# Patient Record
Sex: Female | Born: 1985 | Race: Black or African American | Hispanic: No | Marital: Single | Smoking: Never smoker
Health system: Southern US, Community
[De-identification: ages and names within clinical notes are randomized; demographics above are authoritative.]

## PROBLEM LIST (undated history)

## (undated) DIAGNOSIS — F39 Unspecified mood [affective] disorder: Secondary | ICD-10-CM

## (undated) DIAGNOSIS — F419 Anxiety disorder, unspecified: Secondary | ICD-10-CM

## (undated) DIAGNOSIS — F840 Autistic disorder: Secondary | ICD-10-CM

## (undated) DIAGNOSIS — F32A Depression, unspecified: Secondary | ICD-10-CM

## (undated) DIAGNOSIS — N186 End stage renal disease: Secondary | ICD-10-CM

## (undated) DIAGNOSIS — K219 Gastro-esophageal reflux disease without esophagitis: Secondary | ICD-10-CM

## (undated) DIAGNOSIS — F329 Major depressive disorder, single episode, unspecified: Secondary | ICD-10-CM

## (undated) DIAGNOSIS — IMO0001 Reserved for inherently not codable concepts without codable children: Secondary | ICD-10-CM

## (undated) DIAGNOSIS — R4689 Other symptoms and signs involving appearance and behavior: Secondary | ICD-10-CM

## (undated) DIAGNOSIS — Z992 Dependence on renal dialysis: Secondary | ICD-10-CM

## (undated) DIAGNOSIS — E785 Hyperlipidemia, unspecified: Secondary | ICD-10-CM

## (undated) DIAGNOSIS — F79 Unspecified intellectual disabilities: Secondary | ICD-10-CM

## (undated) DIAGNOSIS — I10 Essential (primary) hypertension: Secondary | ICD-10-CM

## (undated) DIAGNOSIS — E039 Hypothyroidism, unspecified: Secondary | ICD-10-CM

## (undated) DIAGNOSIS — D649 Anemia, unspecified: Secondary | ICD-10-CM

## (undated) DIAGNOSIS — IMO0002 Reserved for concepts with insufficient information to code with codable children: Secondary | ICD-10-CM

## (undated) HISTORY — DX: End stage renal disease: N18.6

## (undated) HISTORY — DX: Depression, unspecified: F32.A

## (undated) HISTORY — DX: Major depressive disorder, single episode, unspecified: F32.9

## (undated) HISTORY — DX: Autistic disorder: F84.0

## (undated) HISTORY — DX: Hyperlipidemia, unspecified: E78.5

## (undated) HISTORY — DX: Anemia, unspecified: D64.9

## (undated) HISTORY — DX: Hypothyroidism, unspecified: E03.9

## (undated) HISTORY — DX: End stage renal disease: Z99.2

## (undated) HISTORY — DX: Unspecified intellectual disabilities: F79

## (undated) HISTORY — DX: Unspecified mood (affective) disorder: F39

## (undated) HISTORY — DX: Reserved for concepts with insufficient information to code with codable children: IMO0002

## (undated) HISTORY — DX: Other symptoms and signs involving appearance and behavior: R46.89

## (undated) HISTORY — DX: Essential (primary) hypertension: I10

## (undated) HISTORY — DX: Anxiety disorder, unspecified: F41.9

---
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
## 2005-01-21 ENCOUNTER — Ambulatory Visit: Payer: Self-pay | Admitting: Family Medicine

## 2005-02-22 ENCOUNTER — Ambulatory Visit: Payer: Self-pay | Admitting: Sports Medicine

## 2005-06-07 ENCOUNTER — Ambulatory Visit: Payer: Self-pay | Admitting: Family Medicine

## 2005-09-08 ENCOUNTER — Ambulatory Visit: Payer: Self-pay | Admitting: Family Medicine

## 2005-09-09 ENCOUNTER — Encounter: Admission: RE | Admit: 2005-09-09 | Discharge: 2005-09-09 | Payer: Self-pay | Admitting: Sports Medicine

## 2005-11-16 ENCOUNTER — Ambulatory Visit: Payer: Self-pay | Admitting: Sports Medicine

## 2006-06-23 DIAGNOSIS — F79 Unspecified intellectual disabilities: Secondary | ICD-10-CM

## 2006-06-23 DIAGNOSIS — F411 Generalized anxiety disorder: Secondary | ICD-10-CM | POA: Insufficient documentation

## 2006-06-23 HISTORY — DX: Unspecified intellectual disabilities: F79

## 2006-09-26 ENCOUNTER — Encounter (INDEPENDENT_AMBULATORY_CARE_PROVIDER_SITE_OTHER): Payer: Self-pay | Admitting: Family Medicine

## 2006-09-26 ENCOUNTER — Ambulatory Visit: Payer: Self-pay | Admitting: Family Medicine

## 2006-09-26 LAB — CONVERTED CEMR LAB
Cholesterol: 210 mg/dL — ABNORMAL HIGH (ref 0–200)
HDL: 56 mg/dL (ref >39)
Total CHOL/HDL Ratio: 3.8
Triglycerides: 72 mg/dL (ref <150)

## 2006-09-27 ENCOUNTER — Encounter (INDEPENDENT_AMBULATORY_CARE_PROVIDER_SITE_OTHER): Payer: Self-pay | Admitting: Family Medicine

## 2006-11-23 ENCOUNTER — Ambulatory Visit: Payer: Self-pay | Admitting: Family Medicine

## 2006-11-23 LAB — CONVERTED CEMR LAB
Blood in Urine, dipstick: NEGATIVE
Glucose, Urine, Semiquant: NEGATIVE
Ketones, urine, test strip: NEGATIVE
Protein, U semiquant: 100
Specific Gravity, Urine: >=1.03
WBC Urine, dipstick: NEGATIVE

## 2006-12-20 ENCOUNTER — Encounter (INDEPENDENT_AMBULATORY_CARE_PROVIDER_SITE_OTHER): Payer: Self-pay | Admitting: Family Medicine

## 2006-12-23 ENCOUNTER — Telehealth: Payer: Self-pay | Admitting: *Deleted

## 2006-12-28 ENCOUNTER — Ambulatory Visit: Payer: Self-pay | Admitting: Family Medicine

## 2006-12-28 LAB — CONVERTED CEMR LAB
Beta hcg, urine, semiquantitative: NEGATIVE
Blood in Urine, dipstick: NEGATIVE
Glucose, Urine, Semiquant: NEGATIVE
Nitrite: NEGATIVE
Protein, U semiquant: 100
Urobilinogen, UA: 0.2
WBC Urine, dipstick: NEGATIVE

## 2008-04-21 ENCOUNTER — Emergency Department (HOSPITAL_COMMUNITY): Admission: EM | Admit: 2008-04-21 | Discharge: 2008-04-21 | Payer: Self-pay | Admitting: Emergency Medicine

## 2008-05-16 ENCOUNTER — Encounter: Payer: Self-pay | Admitting: Family Medicine

## 2008-05-16 ENCOUNTER — Ambulatory Visit: Payer: Self-pay | Admitting: Family Medicine

## 2008-05-16 LAB — CONVERTED CEMR LAB
ALT: 13 units/L (ref 0–35)
Blood in Urine, dipstick: NEGATIVE
CO2: 23 meq/L (ref 19–32)
Calcium: 9.7 mg/dL (ref 8.4–10.5)
Chloride: 104 meq/L (ref 96–112)
Creatinine, Ser: 0.93 mg/dL (ref 0.40–1.20)
HCT: 33.7 % — ABNORMAL LOW (ref 36.0–46.0)
MCHC: 30.9 g/dL (ref 30.0–36.0)
Nitrite: NEGATIVE
Platelets: 331 10*3/uL (ref 150–400)
Potassium: 4.4 meq/L (ref 3.5–5.3)
Protein, U semiquant: 30
RDW: 16.4 % — ABNORMAL HIGH (ref 11.5–15.5)
Total Protein: 7.8 g/dL (ref 6.0–8.3)

## 2008-05-20 ENCOUNTER — Encounter: Payer: Self-pay | Admitting: Family Medicine

## 2008-05-20 DIAGNOSIS — D509 Iron deficiency anemia, unspecified: Secondary | ICD-10-CM | POA: Insufficient documentation

## 2008-05-22 ENCOUNTER — Encounter: Payer: Self-pay | Admitting: Family Medicine

## 2008-05-22 LAB — CONVERTED CEMR LAB
Saturation Ratios: 8 % — ABNORMAL LOW (ref 20–55)
TIBC: 334 ug/dL (ref 250–470)
UIBC: 307 ug/dL

## 2008-05-30 ENCOUNTER — Encounter: Payer: Self-pay | Admitting: Family Medicine

## 2008-10-29 ENCOUNTER — Ambulatory Visit: Payer: Self-pay | Admitting: Family Medicine

## 2008-10-29 ENCOUNTER — Telehealth: Payer: Self-pay | Admitting: Family Medicine

## 2008-10-29 LAB — CONVERTED CEMR LAB
Bilirubin Urine: NEGATIVE
Glucose, Urine, Semiquant: NEGATIVE
Ketones, urine, test strip: NEGATIVE
Nitrite: NEGATIVE
Protein, U semiquant: 100
Specific Gravity, Urine: >=1.03
Urobilinogen, UA: 0.2
WBC Urine, dipstick: NEGATIVE
pH: 5.5

## 2009-02-26 ENCOUNTER — Encounter: Payer: Self-pay | Admitting: Family Medicine

## 2009-02-26 ENCOUNTER — Ambulatory Visit (HOSPITAL_COMMUNITY): Admission: RE | Admit: 2009-02-26 | Discharge: 2009-02-26 | Payer: Self-pay | Admitting: Family Medicine

## 2009-02-26 ENCOUNTER — Ambulatory Visit: Payer: Self-pay | Admitting: Family Medicine

## 2009-02-26 DIAGNOSIS — R339 Retention of urine, unspecified: Secondary | ICD-10-CM

## 2009-02-26 DIAGNOSIS — E66812 Obesity, class 2: Secondary | ICD-10-CM | POA: Insufficient documentation

## 2009-02-26 DIAGNOSIS — R000 Tachycardia, unspecified: Secondary | ICD-10-CM | POA: Insufficient documentation

## 2009-02-26 DIAGNOSIS — I10 Essential (primary) hypertension: Secondary | ICD-10-CM | POA: Insufficient documentation

## 2009-02-26 DIAGNOSIS — E669 Obesity, unspecified: Secondary | ICD-10-CM

## 2009-02-26 LAB — CONVERTED CEMR LAB
Blood in Urine, dipstick: NEGATIVE
CO2: 24 meq/L (ref 19–32)
Calcium: 9.8 mg/dL (ref 8.4–10.5)
Chloride: 103 meq/L (ref 96–112)
Glucose, Bld: 108 mg/dL — ABNORMAL HIGH (ref 70–99)
Nitrite: NEGATIVE
Potassium: 4.6 meq/L (ref 3.5–5.3)
Sodium: 139 meq/L (ref 135–145)
Specific Gravity, Urine: >=1.03
WBC Urine, dipstick: NEGATIVE

## 2009-02-27 ENCOUNTER — Encounter: Payer: Self-pay | Admitting: Family Medicine

## 2009-03-03 ENCOUNTER — Telehealth: Payer: Self-pay | Admitting: *Deleted

## 2009-03-04 ENCOUNTER — Encounter: Payer: Self-pay | Admitting: Family Medicine

## 2009-03-12 ENCOUNTER — Ambulatory Visit: Payer: Self-pay | Admitting: Family Medicine

## 2009-03-12 DIAGNOSIS — E039 Hypothyroidism, unspecified: Secondary | ICD-10-CM

## 2009-03-12 HISTORY — DX: Hypothyroidism, unspecified: E03.9

## 2009-05-28 ENCOUNTER — Ambulatory Visit: Payer: Self-pay | Admitting: Family Medicine

## 2009-05-28 LAB — CONVERTED CEMR LAB
Beta hcg, urine, semiquantitative: NEGATIVE
Bilirubin Urine: NEGATIVE
Blood in Urine, dipstick: NEGATIVE
Glucose, Urine, Semiquant: NEGATIVE
Ketones, urine, test strip: NEGATIVE
Protein, U semiquant: 30
pH: 5.5

## 2009-05-30 ENCOUNTER — Encounter: Admission: RE | Admit: 2009-05-30 | Discharge: 2009-05-30 | Payer: Self-pay | Admitting: Family Medicine

## 2009-06-02 ENCOUNTER — Telehealth: Payer: Self-pay | Admitting: *Deleted

## 2009-06-03 ENCOUNTER — Encounter: Payer: Self-pay | Admitting: *Deleted

## 2009-07-16 ENCOUNTER — Encounter: Payer: Self-pay | Admitting: Family Medicine

## 2009-07-16 ENCOUNTER — Ambulatory Visit: Payer: Self-pay | Admitting: Family Medicine

## 2009-07-17 ENCOUNTER — Encounter: Payer: Self-pay | Admitting: Family Medicine

## 2009-07-17 LAB — CONVERTED CEMR LAB
Calcium: 9.4 mg/dL (ref 8.4–10.5)
Creatinine, Ser: 1.01 mg/dL (ref 0.40–1.20)

## 2009-07-18 LAB — CONVERTED CEMR LAB
Free T4: 0.87 ng/dL (ref 0.80–1.80)
T3, Free: 2.6 pg/mL (ref 2.3–4.2)

## 2009-07-23 ENCOUNTER — Telehealth: Payer: Self-pay | Admitting: Family Medicine

## 2009-07-24 ENCOUNTER — Encounter: Payer: Self-pay | Admitting: Family Medicine

## 2009-08-26 ENCOUNTER — Telehealth: Payer: Self-pay | Admitting: *Deleted

## 2009-08-27 ENCOUNTER — Ambulatory Visit: Payer: Self-pay | Admitting: Family Medicine

## 2009-09-11 ENCOUNTER — Telehealth: Payer: Self-pay | Admitting: Family Medicine

## 2009-10-14 ENCOUNTER — Encounter: Payer: Self-pay | Admitting: Family Medicine

## 2009-10-14 ENCOUNTER — Ambulatory Visit: Payer: Self-pay | Admitting: Family Medicine

## 2009-10-15 ENCOUNTER — Encounter: Payer: Self-pay | Admitting: Family Medicine

## 2009-11-05 ENCOUNTER — Encounter: Payer: Self-pay | Admitting: Family Medicine

## 2009-11-05 ENCOUNTER — Ambulatory Visit: Payer: Self-pay | Admitting: Family Medicine

## 2009-11-05 DIAGNOSIS — L2089 Other atopic dermatitis: Secondary | ICD-10-CM | POA: Insufficient documentation

## 2009-11-05 LAB — CONVERTED CEMR LAB
Blood in Urine, dipstick: NEGATIVE
Glucose, Urine, Semiquant: NEGATIVE
Ketones, urine, test strip: NEGATIVE
Nitrite: NEGATIVE
Protein, U semiquant: 30
WBC Urine, dipstick: NEGATIVE
pH: 6

## 2009-11-12 ENCOUNTER — Telehealth: Payer: Self-pay | Admitting: Family Medicine

## 2009-12-08 ENCOUNTER — Ambulatory Visit: Payer: Self-pay | Admitting: Family Medicine

## 2009-12-08 ENCOUNTER — Encounter: Payer: Self-pay | Admitting: Family Medicine

## 2009-12-09 ENCOUNTER — Encounter: Payer: Self-pay | Admitting: Family Medicine

## 2009-12-09 LAB — CONVERTED CEMR LAB: Glucose, Bld: 81 mg/dL (ref 70–99)

## 2009-12-10 LAB — CONVERTED CEMR LAB
Hemoglobin: 11.1 g/dL — ABNORMAL LOW (ref 12.0–15.0)
RDW: 15.6 % — ABNORMAL HIGH (ref 11.5–15.5)
TSH: 7.846 microintl units/mL — ABNORMAL HIGH (ref 0.350–4.500)

## 2010-01-26 ENCOUNTER — Ambulatory Visit: Payer: Self-pay | Admitting: Family Medicine

## 2010-01-26 ENCOUNTER — Encounter: Payer: Self-pay | Admitting: Family Medicine

## 2010-01-26 DIAGNOSIS — R7301 Impaired fasting glucose: Secondary | ICD-10-CM

## 2010-01-26 LAB — CONVERTED CEMR LAB
Hgb A1c MFr Bld: 5.8 %
Ketones, urine, test strip: NEGATIVE
Nitrite: NEGATIVE
Specific Gravity, Urine: >=1.03
Urobilinogen, UA: 0.2
WBC Urine, dipstick: NEGATIVE

## 2010-01-27 LAB — CONVERTED CEMR LAB: TSH: 4.927 microintl units/mL — ABNORMAL HIGH (ref 0.350–4.500)

## 2010-04-10 ENCOUNTER — Encounter (INDEPENDENT_AMBULATORY_CARE_PROVIDER_SITE_OTHER): Payer: Self-pay | Admitting: *Deleted

## 2010-05-16 ENCOUNTER — Encounter: Payer: Self-pay | Admitting: Family Medicine

## 2010-05-17 ENCOUNTER — Encounter: Payer: Self-pay | Admitting: Family Medicine

## 2010-05-17 ENCOUNTER — Encounter: Payer: Self-pay | Admitting: Sports Medicine

## 2010-05-19 ENCOUNTER — Encounter: Payer: Self-pay | Admitting: Family Medicine

## 2010-05-26 NOTE — Progress Notes (Signed)
Summary: re: transvaginal US/TS        Additional Follow-up for Phone Call Additional follow up Details #2::    [place] RE: TRANSVAG US WITH SEDATION. TONI WILL FIND OUT, IF IT CAN BE DONE AND WILL CALL ME BACK. waiting on call back.  also called pt's mom and lmvm to call back. Follow-up by: Mauricia Area CMA,,  June 02, 2009 11:39 AM  Called Mom about Abd U/S report.  Result negative.  Pt c/o of stomach pain about everyday per Mom, for about 3 months now.  Pt c/o bladder feeling full for about a year now.  Menses every 3 wks, some cramping with it.  Lasts 5 days.  Mom does not feel that c/o abd pain is related to menses.   Mom states that pt still is asking for sedation for transvag u/s, she is quite fearful of anything being inserted.  Told mom we will sched outpt sedation for transvag u/s.  Cat Ta MD  June 02, 2009 8:46 AM

## 2010-05-26 NOTE — Progress Notes (Signed)
   Phone Note Outgoing Call   Call placed by: Merla Riches MD,  Sep 11, 2009 3:57 PM Call placed to: Patient Summary of Call: Called Mom.  Checking on patient about visit two weeks ago for nausea.  I would also like for pt to come in for another TSH check, at her earliest convenience.  Pt does not have to see me for this.  I will call mom with result.   Initial call taken by: Deeana Atwater MD,  Sep 11, 2009 3:58 PM

## 2010-05-26 NOTE — Assessment & Plan Note (Signed)
Summary: f/up,tcb   Vital Signs:  Patient profile:   25 year old female Height:      64.5 inches Weight:      208 pounds BMI:     35.28 Temp:     98.3 degrees F oral Pulse rate:   84 / minute BP sitting:   130 / 84  (right arm) Cuff size:   large  Vitals Entered By: Schuyler Amor CMA (July 16, 2009 8:38 AM)  Nutrition Counseling: Patient's BMI is greater than 25 and therefore counseled on weight management options. CC: F/U BP Pain Assessment Patient in pain? no        Primary Care Provider:  Chloee Tena MD  CC:  F/U BP.  History of Present Illness: 25 y/o F with HTN, Obesity, Mental retardation here with mother for HTN f/u  HYPERTENSION Disease Monitoring: has not checked  Blood pressure range: 130s-140s   Medications Compliance: yes  Lightheadedness: no Edema: no  Chest pain: no  Dyspnea: yes with taking out garbage (walks up a hill to take garbage out) Prevention Exercise: no, very sedantary Salt restriction: no, eating canned foods  OBESITY Not exercising.  Not dieting.  Pt's usual day: sleep, gets up, watch TV, eat, sleep, repeat.  She considers "walking in the house" exercise.     Current Medications (verified): 1)  Ferrous Sulfate 325 (65 Fe) Mg Tabs (Ferrous Sulfate) .... One Tablet By Mouth Two Times A Day 2)  Seroquel 300 Mg Tabs (Quetiapine Fumarate) .... 2 Tabs By Mouth At Bedtime Per Dr Jordan Hawks 3)  Metoprolol Tartrate 50 Mg Tabs (Metoprolol Tartrate) .Marland Kitchen.. 1 Tab By Mouth Bid 4)  Luvox Cr 100 Mg Xr24h-Cap (Fluvoxamine Maleate) .... 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    Past Surgical History: Last updated: 06/23/2006 Cholecystectomy - 11/24/2004  Family History: Last updated: 05/16/2008 Diabetes:  maternal grandparents HTN:   mother, maternal grandmother CAD:  maternal great grandmother Bladder CA: maternal grandmother  Social History: Last updated: 02/26/2009 Lives with Mother; No tobacco, ETOH; VERY sedentary.  Never sexually active.  Physical Exam  General:  Well-developed,well-nourished,in no acute distress; alert,appropriate and cooperative throughout examination. vitals reviewed Neck:  supple, full ROM, and no masses.   Lungs:  Normal respiratory effort, chest expands symmetrically. Lungs are clear to auscultation, no crackles or wheezes. Heart:  Normal rate and regular rhythm. S1 and S2 normal without gallop, murmur, click, rub or other extra sounds. Abdomen:  Bowel sounds positive,abdomen soft and non-tender without masses, organomegaly or hernias noted. obese Pulses:  R and L radial,dorsalis pedis pulses are full and equal bilaterally Extremities:  No clubbing, cyanosis, edema, or deformity noted with normal full range of motion of all joints.   Skin:  Intact without suspicious lesions or rashes Cervical Nodes:  No lymphadenopathy noted   Impression & Recommendations:  Problem # 1:  HYPERTENSION (ICD-401.9) Assessment Improved  BP improved from last visit and almost at goal at 130/84, pulse 84.  I will not make changes to BP med at this time.  We discussed decreasing salt intake (pt eats a lot of canned foods).  Will try to change to frozen foods.  Pt not adding salt to food.  Will also try to exercise, with weight loss should decresae BP.  RTC  in 3 months for recheck.  Her updated medication list for this problem includes:    Metoprolol Tartrate 50 Mg Tabs (Metoprolol tartrate) .Marland Kitchen... 1 tab by mouth bid  Orders: [facility] [id number]) [place]- Est Level  3 [id number])  Problem # 2:  OBESITY, CLASS I (ICD-278.02) Assessment: Unchanged  BMI 35.  Pt states that she cannot walk outside of the house by herself and mom works during day.  I tried to get Mom to walk with pt, but Mom not interested in  walking.  I tried to encouraged pt to exercise with DVDx and VHS tapes she can get from [employer].  Mom did not commit to taking pt to [employer].  I do not think that will lose weight, but will continue to enncourage this.  Will refer to nutrition if no weight loss at next visit.    Orders: [place]- Est Level  3 [id number])  Complete Medication List: 1)  Ferrous Sulfate 325 (65 Fe) Mg Tabs (Ferrous sulfate) .... One tablet by mouth two times a day 2)  Seroquel 300 Mg Tabs (Quetiapine fumarate) .... 2 tabs by mouth at bedtime per dr senna 3)  Metoprolol Tartrate 50 Mg Tabs (Metoprolol tartrate) .Marland Kitchen.. 1 tab by mouth bid 4)  Luvox Cr 100 Mg Xr24h-cap (Fluvoxamine maleate) .... Increase slowly to 2 tab two times a day per dr  Arbie Cookey senna  Other Orders: TSH-[facility] [id number]) TSH-[facility] [id number])  Patient Instructions: 1)  Please schedule a follow-up appointment in 3 months.  2)  You need to lose weight. Consider a lower calorie diet and regular exercise.  3)  It is important that you exercise reguarly at least 20 minutes 5 times a week. If you develop chest pain, have severe difficulty breathing, or feel very tired, stop exercising immediately and seek medical attention.     Prevention & Chronic Care Immunizations   Influenza vaccine: Fluvax MCR  (03/12/2009)    Tetanus booster: 05/16/2008: Tdap    Pneumococcal vaccine: Not documented  Other Screening   Pap smear: Not documented   Pap smear action/deferral: Not indicated-other  (05/28/2009)   Smoking status: never  (05/28/2009)  Hypertension   Last Blood Pressure: 130 / 84  (07/16/2009)   Serum creatinine: 0.91  (02/26/2009)   BMP action: Ordered   Serum potassium 4.6  (02/26/2009)    Hypertension flowsheet reviewed?: Yes   Progress toward BP goal: At goal  Self-Management Support :   Personal Goals (by the next clinic visit) :      Personal blood pressure goal: 130/80  (07/16/2009)   Hypertension self-management support: Written  self-care plan  (07/16/2009)   Hypertension self-care plan printed.

## 2010-05-26 NOTE — Assessment & Plan Note (Signed)
Summary: bladder not empyting ,df   Vital Signs:  Patient profile:   25 year old female Weight:      212 pounds BMI:     35.96 Temp:     98.3 degrees F oral Pulse rate:   72 / minute Pulse rhythm:   regular BP sitting:   127 / 80  (left arm) Cuff size:   large  Vitals Entered By: Audelia Hives CMA (January 26, 2010 8:36 AM) CC: follow-up visit   Primary Care Provider:  CAT TA MD  CC:  follow-up visit.  History of Present Illness: 25 y/o F with MR, anxiety DO, HTN, obesity is here for hypothyroid follow-up but mom has other concerns.  Mom brought pt to appt today.   Hypothyroidism: taking  Synthroid 70mcg without problems.  Pt has had weight gain in the past year of 4 lbs.  She leads a sedantary lifestyle and does not perform any exercises.   She denies constipation, diarrhea, abd pain, fever, chills, weight loss.    Urinary symptoms: feels like she has to void, but after voiding it feels like she did not empty her bladder.  She has had incidences of "leaking".  She states that sometimes she would sit on the toilet and after voiding urine would still leak.  She has not made it to the bathroom a few times.  Mom states that when she was in school before (yrs ago) she would her urine and not tell her teacher that she has to go to the bathroom.  She states that she sometimes wait a long time now to void.  Denies dysuria, fever, chills.    HYPERTENSION Disease Monitoring Blood pressure range: 110s-130s/70s-80s Medications: metoprolol 50mg  two times a day  Compliance: yes  Lightheadedness: no  Edema:no  Chest pain: no  Dyspnea:no     Prevention Exercise: not really Salt restriction:yes   OBESITY BMI: 35.96 Weight difference since last OV:  +5lbs since 10/2009 Exercise: no, but just started walking       How often:   2-3x/wk    Diet: mom trying to give her healthier diet  Dysfunctional Glucose:  fasting and random glucose <126.  Mom statest that Dr Jordan Hawks (psych) checked her "sugar"  and it was 6.9.  Mom concerned about diabetes.    Habits & Providers  Alcohol-Tobacco-Diet     Tobacco Status: never     Passive Smoke Exposure: no  Current Medications (verified): 1)  Ferrous Sulfate 325 (65 Fe) Mg Tabs (Ferrous Sulfate) .... One Tablet By Mouth Two Times A Day 2)  Seroquel 300 Mg Tabs (Quetiapine Fumarate) .Marland Kitchen.. 1 Tab By Mouth At Bedtime 3)  Metoprolol Tartrate 50 Mg Tabs (Metoprolol Tartrate) .Marland Kitchen.. 1 Tab By Mouth Bid 4)  Sertraline Hcl 100 Mg Tabs (Sertraline Hcl) .Marland Kitchen.. 1 and 1/2 Tabs By Mouth Daily 5)  Abilify 10 Mg Tabs (Aripiprazole) .Marland Kitchen.. 1 Tab By Mouth Qam 6)  Divalproex Sodium 500 Mg Tbec (Divalproex Sodium) .... 2 Tabs By Mouth Qhs 7)  Pantoprazole Sodium 40 Mg Tbec (Pantoprazole Sodium) .Marland Kitchen.. 1 Tab By Mouth Two Times A Day For 3 Days and Then Daily 8)  Colace 100 Mg Caps (Docusate Sodium) .Marland Kitchen.. 1 Tab By Mouth Two Times A Day 9)  Triamcinolone Acetonide 0.5 % Crea (Triamcinolone Acetonide) .... Apply To Affected Areas Two Times A Day.  Dispense 60 Grams. 10)  Hydroxyzine Hcl 25 Mg Tabs (Hydroxyzine Hcl) .Marland Kitchen.. 1 Tab By Mouth Once A Day As Needed For Itchiness  11)  Synthroid 75 Mcg Tabs (Levothyroxine Sodium) .Marland Kitchen.. 1 Tab By Mouth Daily.  Please Keep Same Manufacturer As Previous Dose.  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   Subclinical Hypothyroidism Hypertension Obesity Iron Def Anemia  Past Surgical History: Last updated: 06/23/2006 Cholecystectomy - 11/24/2004  Family History: Last updated: 05/16/2008 Diabetes:  maternal grandparents HTN:  mother, maternal grandmother CAD:  maternal great grandmother Bladder CA: maternal grandmother  Social History: Last updated: 02/26/2009 Lives with Mother; No tobacco, ETOH; VERY sedentary.  Never sexually active.  Risk Factors: Caffeine Use: 0  (05/16/2008) Exercise: no (05/16/2008)  Risk Factors: Smoking Status: never (01/26/2010) Passive Smoke Exposure: no (01/26/2010)  Review of Systems       per hpi   Physical Exam  General:  Well-developed,well-nourished,in no acute distress; alert,appropriate and cooperative throughout examination Neck:  No deformities, masses, or tenderness noted. Lungs:  Normal respiratory effort, chest expands symmetrically. Lungs are clear to auscultation, no crackles or wheezes. Heart:  Normal rate and regular rhythm. S1 and S2 normal without gallop, murmur, click, rub or other extra sounds. No bruit Abdomen:  Bowel sounds positive,abdomen soft and non-tender without masses, organomegaly or hernias noted. Msk:  normal ROM.   Pulses:  R radial normal, R dorsalis pedis normal, L radial normal, and L dorsalis pedis normal.   Extremities:  No clubbing, cyanosis, edema, or deformity noted with normal full range of motion of all joints.   Neurologic:  alert & oriented X3.   Cervical Nodes:  No lymphadenopathy noted   Impression & Recommendations:  Problem # 1:  HYPOTHYROIDISM (ICD-244.9) Assessment Unchanged Will check TSH today.  Last TSH was elevated.   Her updated medication list for this problem includes:    Synthroid 75 Mcg Tabs (Levothyroxine sodium) .Marland Kitchen... 1 tab by mouth daily.  please keep same manufacturer as previous dose.  Orders: [facility] [id number]) [place]- Est  Level 4 [id number])  Problem # 2:  INCOMPLETE VOIDING [id number]) Assessment: Unchanged Pt has had this complaint for a few months to a year.  As before UA negative.  She would not allow me to perform pelvic exam.  I would like to have post void residual/bladder scan done, but in radiology dept this test requires probe/cath insertion.  Pt refused this.  We will try to bladder train instead.  Pt to void first thing in AM and then she is to go to bathroom and sit on toilet every 4 hours.  I am hoping that training and timing voiding  time will decrease "leaking".   Orders: [facility] (00000) [place]- Est  Level 4 [id number])  Problem # 3:  HYPERTENSION (ICD-401.9) Assessment: Unchanged BP stable on current med.  Will continue.  Her updated medication list for this problem includes:    Metoprolol Tartrate 50 Mg Tabs (Metoprolol tartrate) .Marland Kitchen... 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  Random blood glucose here has been wnl.  Will check A1C today since pt does have risk factor of obesity.  Orders: [facility]- Est  Level 4 ([id number]) A1C-[facility] [id number])  Complete Medication List: 1)  Ferrous Sulfate 325 (65 Fe) Mg Tabs (Ferrous sulfate) .... One tablet by mouth two times a day 2)  Seroquel 300 Mg Tabs (Quetiapine  fumarate) .Marland Kitchen.. 1 tab by mouth at bedtime 3)  Metoprolol Tartrate 50 Mg Tabs (Metoprolol tartrate) .Marland Kitchen.. 1 tab by mouth bid 4)  Sertraline Hcl 100 Mg Tabs (Sertraline hcl) .Marland Kitchen.. 1 and 1/2 tabs by mouth daily 5)  Abilify 10 Mg Tabs (Aripiprazole) .Marland Kitchen.. 1 tab by mouth qam 6)  Divalproex Sodium 500 Mg Tbec (Divalproex sodium) .... 2 tabs by mouth qhs 7)  Pantoprazole Sodium 40 Mg Tbec (Pantoprazole sodium) .Marland Kitchen.. 1 tab by mouth two times a day for 3 days and then daily 8)  Colace 100 Mg Caps (Docusate sodium) .Marland Kitchen.. 1 tab by mouth two times a day 9)  Triamcinolone Acetonide 0.5 % Crea (Triamcinolone acetonide) .... Apply to affected areas two times a day.  dispense 60 grams. 10)  Hydroxyzine Hcl 25 Mg Tabs (Hydroxyzine hcl) .Marland Kitchen.. 1 tab by mouth once a day as needed for itchiness 11)  Synthroid 75 Mcg Tabs (Levothyroxine sodium) .Marland Kitchen.. 1 tab by mouth daily.  please keep same manufacturer as previous dose. Prescriptions: METOPROLOL TARTRATE 50 MG TABS (METOPROLOL TARTRATE) 1 tab by mouth bid  #66 Tablet x 5   Entered and Authorized by:   Merla Riches MD   Signed by:   Merla Riches MD on 01/26/2010   Method used:   Electronically  to        [facility]. [id number]* (retail)       [address]       [place], [place]  [address]       Ph: [phone] or [phone]       Fax: [phone]   RxID:   [id number]   Laboratory Results   Urine Tests  Date/Time Received: January 26, 2010 9:09 AM  Date/Time Reported: January 26, 2010 10:15 AM   Routine Urinalysis   Color: yellow Appearance: Clear Glucose: negative   (Normal Range: Negative) Bilirubin: negative   (Normal Range: Negative) Ketone: negative   (Normal Range: Negative) Spec. Gravity: >=1.030   (Normal Range: 1.003-1.035) Blood: negative   (Normal Range: Negative) pH: 5.5   (Normal Range: 5.0-8.0) Protein: 30   (Normal Range: Negative) Urobilinogen: 0.2   (Normal Range: 0-1) Nitrite: negative   (Normal Range: Negative) Leukocyte Esterace: negative   (Normal Range: Negative)  Urine Microscopic WBC/HPF: rare RBC/HPF: rare Bacteria/HPF: 2+ cocci Mucous/HPF: 2+ Epithelial/HPF: 5-10    Comments: ...............test performed by......Marland KitchenBonnie A. Martinique, MLS (ASCP)cm   Blood Tests   Date/Time Received: January 26, 2010 9:09 AM  Date/Time Reported: January 26, 2010 10:01 AM   HGBA1C: 5.8%   (Normal Range: Non-Diabetic - 3-6%   Control Diabetic - 6-8%)  Comments: ...............test performed by......Marland KitchenBonnie A. Martinique, MLS (ASCP)cm

## 2010-05-26 NOTE — Progress Notes (Signed)
Summary: triage   Phone Note Call from Patient Call back at Home Phone [phone]   Caller: mom-[name] Summary of Call: Wondering if she can get a workin for tomorrow morning at 8:30 for nausea. Initial call taken by: Raymond Gurney,  Aug 26, 2009 8:33 AM  Follow-up for Phone Call        spoke with mom. nausea x 3 weeks. occasional vomiting. no one else in home sick. mom denies possibility of pregnancy. appt for 8:30am tomorrow Follow-up by: Elige Radon RN,  Aug 26, 2009 8:43 AM

## 2010-05-26 NOTE — Assessment & Plan Note (Signed)
Summary: cloudy urine & burning/[place]   Vital Signs:  Patient profile:   25 year old female Weight:      202.4 pounds Temp:     98.4 degrees F oral Pulse rate:   101 / minute BP sitting:   131 / 85  (left arm)  Vitals Entered By: Carolyne Littles (October 29, 2008 11:03 AM) CC: frequency, coudy urine Is Patient Diabetic? No   Primary Care Provider:  CAT TA MD  CC:  frequency and coudy urine.  History of Present Illness: Vanessa Alvarez is a 25 year old female with MR that was brought in by her mother for concern of cloudy urine and frequency.  1. Urine: cloudy x a few days, patient endorses dysuria, frequency, urgency, and intermittent suprapubic pain; she denies back pain, fever/chills, vaginal discharge, sexual intercourse. She admits to decreased fluid intake this summer. FamHx of DM, but never diagnosed in patient, last random glucose was 81 (05/16/08). Patient does take Abilify and Benztropine.  Habits & Providers  Alcohol-Tobacco-Diet     Tobacco Status: never  Current Medications (verified): 1)  Benztropine Mesylate 1 Mg Tabs (Benztropine Mesylate) .... One Tablet By Mouth Bid 2)  Abilify 20 Mg Tabs (Aripiprazole) .... One Tablet At Bedtime 3)  Sertraline Hcl 100 Mg Tabs (Sertraline Hcl) .... One and One-Half Tablet By Mouth Daily 4)  Ferrous Sulfate 325 (65 Fe) Mg Tabs (Ferrous Sulfate) .... One Tablet By Mouth Two Times A Day  Allergies (verified): No Known Drug Allergies  Review of Systems       per HPI, otherwise negative  Physical Exam  General:  alert, well-developed, and well-nourished.   Lungs:  CTAB Heart:  RRR Abdomen:  Bowel sounds positive,abdomen soft and non-tender without masses, organomegaly or hernias noted. Extremities:  No clubbing, cyanosis, edema. Psych:  Oriented X3 and normally interactive.     Impression & Recommendations:  Problem # 1:  DEHYDRATION (ICD-276.51)  No evidence of infection on UA, but elevated SG indicating volume depletion. Advised  drinking lots of fluids. Gave red flags for follow up. Questions answered. Note: office visit for same issue 11/23/06.   Orders: [place]- Est Level  3 [id number])  Problem # 2:  MENTAL RETARDATION (ICD-319) Assessment: Unchanged  Continue current management.  Orders: [place]- Est Level  3 [id number])  Problem # 3:  ELEVATED BLOOD PRESSURE WITHOUT DIAGNOSIS OF HYPERTENSION (ICD-796.2) Assessment: Unchanged  Advised to follow up with PCP.  Orders: [place]- Est Level  3 [id number])  Complete Medication List: 1)  Benztropine Mesylate 1 Mg Tabs (Benztropine mesylate) .... One tablet by mouth bid 2)  Abilify 20 Mg Tabs (Aripiprazole) .... One tablet at bedtime 3)  Sertraline Hcl 100 Mg Tabs (Sertraline hcl) .... One and one-half tablet by mouth daily 4)  Ferrous Sulfate 325 (65 Fe) Mg Tabs (Ferrous sulfate) .... One tablet by mouth two times a day  Other Orders: Urinalysis-[facility] (00000)  Patient Instructions: 1)  Please schedule a follow-up appointment with Dr. Earley Favor to discuss elevated blood pressure.  2)  2)  Drink at least 6-8 8oz glass of water daily, so that your urine is less concentrated.   3)  3)  You have no evidence of urine infection today. 4)  4)  Return if fever, burning with urination, or urinary retention.    Laboratory Results   Urine Tests  Date/Time Received: October 29, 2008 11:18 AM  Date/Time Reported: October 29, 2008 11:38 AM   Routine Urinalysis   Color: yellow  Appearance: Cloudy Glucose: negative   (Normal Range: Negative) Bilirubin: negative   (Normal Range: Negative) Ketone: negative   (Normal Range: Negative) Spec. Gravity: >=1.030   (Normal Range: 1.003-1.035) Blood: large   (Normal Range: Negative) pH: 5.5   (Normal Range: 5.0-8.0) Protein: 100   (Normal Range: Negative) Urobilinogen: 0.2   (Normal Range: 0-1) Nitrite: negative   (Normal Range: Negative) Leukocyte Esterace: negative   (Normal Range: Negative)  Urine Microscopic WBC/HPF: rare RBC/HPF:  loaded Bacteria/HPF: 2+ Epithelial/HPF: 10-20 Other: few epith that appear to be clue cells    Comments: ...............test performed by......Marland KitchenBonnie A. Martinique, [place] (ASCP)

## 2010-05-26 NOTE — Progress Notes (Signed)
Summary: Lab Res   Phone Note Call from Patient Call back at Home Phone [phone]   Caller: Patient Summary of Call: Checking on test results from last week. Initial call taken by: Raymond Gurney,  July 23, 2009 8:57 AM  Follow-up for Phone Call        to PCP. Follow-up by: Marcell Barlow RN,  July 23, 2009 9:01 AM  Additional Follow-up for Phone Call Additional follow up Details #1::        Letter sent out.  Additional Follow-up by: Merla Riches MD,  July 24, 2009 5:32 AM       Past History:  Past Medical History: MR Austistic Mood disorder:  Sees Dr Silvio Pate, who prescribes meds Saw optometrist 11/09.   Subclinical Hypothyroidism Hypertension Obesity Iron Def Anemia

## 2010-05-26 NOTE — Progress Notes (Signed)
Summary: Bladder scan?   Phone Note Outgoing Call   Call placed by: Torie Towle MD,  November 12, 2009 3:47 PM Call placed to: Patient Summary of Call: Left message on voicemail Re pt's complaint of not able to empty her bladder all the way.  UA and UCx was negative.  Next step is bladder scan, which we can schedule with radiology.  A referral to urology may be necessary.   Pt is very concerned about any instrumentation into her body so I'm not sure she will let us proceed.  Mom can think about it and we can monitor.  If complaint is worse we can move forward with bladder scan.  We can wait in the meantime.   We can discuss at next appt.  Initial call taken by: Anusha Claus MD,  November 12, 2009 3:48 PM

## 2010-05-26 NOTE — Letter (Signed)
Summary: Results Follow-up Letter  [facility]  [address]   [place], [place] [address]   Phone: [phone]  Fax: [phone]    07/24/2009  [place], [place]  [address]  Dear Ms. Dart,   The following are the results of your recent test(s):    Sodium                    140 mEq/L                      Potassium                 4.4 mEq/L                     Chloride                  105 mEq/L                     CO2                       22 mEq/L                       Glucose                   77 mg/dL                      BUN                       12 mg/dL                       Creatinine                1.01 mg/dL                     Calcium                   9.4 mg/dL                     TSH                       13.783 uIU/mL         Free T4           0.87   Free T3           2.6          Kidney function, Electrolytes: NORMAL Thyroid: Subclinical Hypothyroidism.  Will repeat test in 6 months.    Sincerely,  Samanth Mirkin MD [facility]           Appended Document: Results Follow-up Letter Letter mailed

## 2010-05-26 NOTE — Assessment & Plan Note (Signed)
Summary: nausea x3 wks/Baden/ta   Vital Signs:  Patient profile:   25 year old female Height:      64.5 inches Weight:      203 pounds BMI:     34.43 Temp:     98.7 degrees F Pulse rate:   92 / minute BP sitting:   116 / 77  (right arm)  Vitals Entered By: Elray Mcgregor RN (Aug 27, 2009 8:52 AM) CC: Work in for nausea Is Patient Diabetic? No Pain Assessment Patient in pain? yes     Location: epigastric Intensity: 5 Type: burning Onset of pain  3 weeks ago   Primary Care Provider:  CAT TA MD  CC:  Work in for nausea.  History of Present Illness: Vanessa Alvarez comes in with her mother for nauseaa for about 3-4 weeks.  Associated with epigatric pain and sometimes a burning feeling in her throat.  Worse after eating.  Has vomitted 2 times.  No hematemesis and no blood in her stools.  One instance of dark stools but after starting iron.  Has not tried any OTC remedies.  Of note, was started on new psych meds at the end of Feb by Dr. Silvio Pate.   Has follow-up appt with her next week.  New meds updated in centricity. Her weight is down 5 # from visit 6 weeks ago, but she has been trying to eat better - more salads and fruit and less canned foods.  Appetite is still good despite the nausea.   Habits & Providers  Alcohol-Tobacco-Diet     Tobacco Status: never  Current Medications (verified): 1)  Ferrous Sulfate 325 (65 Fe) Mg Tabs (Ferrous Sulfate) .... One Tablet By Mouth Two Times A Day 2)  Seroquel 300 Mg Tabs (Quetiapine Fumarate) .Marland Kitchen.. 1 Tab By Mouth At Bedtime 3)  Metoprolol Tartrate 50 Mg Tabs (Metoprolol Tartrate) .Marland Kitchen.. 1 Tab By Mouth Bid 4)  Sertraline Hcl 100 Mg Tabs (Sertraline Hcl) .Marland Kitchen.. 1 and 1/2 Tabs By Mouth Daily 5)  Benztropine Mesylate 1 Mg Tabs (Benztropine Mesylate) .Marland Kitchen.. 1 Tab By Mouth Bid 6)  Abilify 10 Mg Tabs (Aripiprazole) .Marland Kitchen.. 1 Tab By Mouth Qam 7)  Divalproex Sodium 500 Mg Tbec (Divalproex Sodium) .... 2 Tabs By Mouth Qhs 8)  Pantoprazole Sodium 40 Mg Tbec (Pantoprazole  Sodium) .Marland Kitchen.. 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   Subclinical Hypothyroidism Hypertension Obesity Iron Def Anemia  Past Surgical History: Last updated: 06/23/2006 Cholecystectomy - 11/24/2004  Family History: Last updated: 05/16/2008 Diabetes:  maternal grandparents HTN:  mother, maternal grandmother CAD:  maternal great grandmother Bladder CA: maternal grandmother  Social History: Last updated: 02/26/2009 Lives with Mother; No tobacco, ETOH; VERY sedentary.  Never sexually active.  Physical Exam  General:  obese, alert, NAD, pleasant vitals reviewed.  Lungs:  Normal respiratory effort, chest expands symmetrically. Lungs are clear to auscultation, no crackles or wheezes. Heart:  Normal rate and regular rhythm. S1 and S2 normal without gallop, murmur, click, rub or other extra sounds. Abdomen:  Bowel sounds positive,abdomen soft and non-tender without masses, organomegaly or hernias noted.   Impression & Recommendations:  Problem # 1:  NAUSEA (ICD-787.02) Assessment New  Feel GERD or medication side effect are most likely etiologies.  SHe has had a cholecystectomy.  Thyroid checked  6 weeks ago.  TSH elevated but Free T3 and free T4 normal.  Will try starting protonix, two times a day for 3 days and then daily.  Also advised to discuss this withDr. Silvio Pate next week, especially if not improved after starting protonix, in case she feels one of her new psych meds may be causing this.  Return if not improved in 2 weeks.   Orders: [place]- Est  Level 4 [id number])  Complete Medication List: 1)  Ferrous Sulfate 325 (65 Fe) Mg Tabs (Ferrous sulfate) .... One tablet by mouth two times a day 2)  Seroquel 300 Mg Tabs (Quetiapine fumarate) .Marland Kitchen.. 1 tab by mouth at bedtime 3)   Metoprolol Tartrate 50 Mg Tabs (Metoprolol tartrate) .Marland Kitchen.. 1 tab by mouth bid 4)  Sertraline Hcl 100 Mg Tabs (Sertraline hcl) .Marland Kitchen.. 1 and 1/2 tabs by mouth daily 5)  Benztropine Mesylate 1 Mg Tabs (Benztropine mesylate) .Marland Kitchen.. 1 tab by mouth bid 6)  Abilify 10 Mg Tabs (Aripiprazole) .Marland Kitchen.. 1 tab by mouth qam 7)  Divalproex Sodium 500 Mg Tbec (Divalproex sodium) .... 2 tabs by mouth qhs 8)  Pantoprazole Sodium 40 Mg Tbec (Pantoprazole sodium) .Marland Kitchen.. 1 tab by mouth two times a day for 3 days and then daily  Patient Instructions: 1)  Your nausea could be coming from one of your new medications or it could be coming from reflux.  Try the pantoprazole (generic for Protonix) to see if this helps.  Allow about 3 days to notice its full effect.   2)  Also, please tell Dr. Silvio Pate about the nausea in case one of the new medications is causing it. 3)  If the nausea does not improve with this medication and Dr. Silvio Pate doesn't think it is one of the medicines, then please return for further evaluation.  Prescriptions: PANTOPRAZOLE SODIUM 40 MG TBEC (PANTOPRAZOLE SODIUM) 1 tab by mouth two times a day for 3 days and then daily  #33 x 3   Entered and Authorized by:   Orland Mustard  MD   Signed by:   Orland Mustard  MD on 08/27/2009   Method used:   Print then Give to Patient   RxID:   [id number]

## 2010-05-26 NOTE — Miscellaneous (Signed)
Summary: Rx for Synthroid for hypothyroidism  Medications Added SYNTHROID 50 MCG TABS (LEVOTHYROXINE SODIUM) 1 tab by mouth daily.  Please use the same manufacturer/brand on refills.       Clinical Lists Changes  Medications: Added new medication of SYNTHROID 50 MCG TABS (LEVOTHYROXINE SODIUM) 1 tab by mouth daily.  Please use the same manufacturer/brand on refills. - Signed Rx of SYNTHROID 50 MCG TABS (LEVOTHYROXINE SODIUM) 1 tab by mouth daily.  Please use the same manufacturer/brand on refills.;  #30 x 2;  Signed;  Entered by: Merla Riches MD;  Authorized by: Merla Riches MD;  Method used: Electronically to [id number] [address] [place], [place]  [address], Ph: [phone] or [phone], Fax: [phone]    Prescriptions: SYNTHROID 50 MCG TABS (LEVOTHYROXINE SODIUM) 1 tab by mouth daily.  Please use the same manufacturer/brand on refills.  #30 x 2   Entered and Authorized by:   Merla Riches MD   Signed by:   Merla Riches MD on 10/15/2009   Method used:   Electronically to        [facility]. [id number]* (retail)       [place].       [place],   [address]       Ph: [phone] or [phone]       Fax: [phone]   RxID:   [id number]

## 2010-05-26 NOTE — Miscellaneous (Signed)
Summary: [id number] appt for US/TS  called pt's mom and informed of [place]. she said, that the pt did not want to have this procedure done, she is too scared. i told the mom to think about it one more day before i cancel the appt...fwd. to dr.ta for review.Mauricia Area CMA,  June 03, 2009 10:07 AM  Clinical Lists Changes  Called pt's Mom.  Mom states that pt still afraid about transvag even if under sedation.  Pt has normal voiding.  Normal menses.  Told mom we can just watch for now.  If pain becomes worse or bladder complaints become intolerable to pt, we can get U/S then.  We may consider CT, but I think that this would be low yield.  Mom in agreement with this.  Will cancel transvag u/s due to pt's declining study. Will monitor.  Pt to see me in 4-5 wks.  Cat Ta MD  June 04, 2009 11:03 AM  called [facility] and spoke with Lars Mage and cancelled the [place] appt for tomorrow.Mauricia Area CMA,  June 05, 2009 11:26 AM

## 2010-05-26 NOTE — Assessment & Plan Note (Signed)
Summary: f/u bp,df   Vital Signs:  Patient profile:   25 year old female Weight:      208.9 pounds BMI:     35.43 Temp:     97.6 degrees F Pulse rate:   105 / minute BP sitting:   132 / 84  (left arm)  Vitals Entered By: Geanie Cooley RN (May 28, 2009 8:35 AM) CC: f/u bp Is Patient Diabetic? No Pain Assessment Patient in pain? no        Primary Care Provider:  Zakee Deerman MD  CC:  f/u bp.  History of Present Illness: HYPERTENSION Disease Monitoring Blood pressure range: 140s Chest pain: no Dyspnea:no Medications Compliance: yes Lightheadedness: no  Edema:no Prevention Exercise:  no Salt restriction: yes BP at home. 138/79, 145/88, 159/80, 143/83, 142/85 HR 103, 118, 132  "Bladder feels full": Pt feels uncomfortable.  Per mom, pt complains that it hurts when it feels full.  After she voids pain goes away.  But sometimes she tries to void, there is no urine.  no hematuria.  ? frequency.  no dysuria.  Menses every 3 wks.  not on birth contro.  lmp 05/20/09.  pain not related to period  Habits & Providers  Alcohol-Tobacco-Diet     Tobacco Status: never  Current Problems (verified): 1)  Abdominal Pain, Lower  (ICD-789.09) 2)  Hypothyroidism  (ICD-244.9) 3)  Tachycardia  (ICD-785.0) 4)  Hypertension  (ICD-401.9) 5)  Obesity, Class I  (ICD-278.02) 6)  Anemia, Iron Deficiency  (ICD-280.9) 7)  Incomplete Voiding  (ICD-788.21) 8)  Pruritus  (ICD-698.9) 9)  Dysuria  (ICD-788.1) 10)  Anxiety  (ICD-300.00) 11)  Mental Retardation  (ICD-319)  Current Medications (verified): 1)  Ferrous Sulfate 325 (65 Fe) Mg Tabs (Ferrous Sulfate) .... One Tablet By Mouth Two Times A Day 2)  Seroquel 300 Mg Tabs (Quetiapine Fumarate) .... 2 Tabs By Mouth At Bedtime Per Dr Jordan Hawks 3)  Metoprolol Tartrate 50 Mg Tabs (Metoprolol Tartrate) .Marland Kitchen.. 1 Tab By Mouth Bid 4)  Luvox Cr 100 Mg Xr24h-Cap (Fluvoxamine Maleate) .... 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    Past Surgical History: Last updated: 06/23/2006 Cholecystectomy - 11/24/2004  Family History: Last updated: 05/16/2008 Diabetes:  maternal grandparents HTN:  mother, maternal grandmother CAD:  maternal great grandmother Bladder CA: maternal grandmother  Social History: Last updated: 02/26/2009 Lives with Mother; No tobacco, ETOH; VERY sedentary.  Never sexually active.  Physical Exam  General:  Well-developed,well-nourished,in no acute distress; alert,appropriate and cooperative throughout examination. vitals reviewed.   Lungs:  Normal respiratory effort, chest expands symmetrically. Lungs are clear to auscultation, no crackles or wheezes. Heart:  Normal rate and regular rhythm. S1 and S2 normal without gallop, murmur, click, rub or other extra sounds. Abdomen:  Bowel sounds positive,abdomen soft and non-tender without masses, organomegaly or hernias noted. obese.  no masses, no guarding, no rigidity, no rebound tenderness, no hepatomegaly, and no splenomegaly.   Rectal:  no edema.    Impression & Recommendations:  Problem # 1:  ABDOMINAL PAIN, LOWER (ICD-789.09) Assessment New UA wnl.  Upreg negative.  Pt refuses pelvic exam.  We discussed abd u/s and transvag u/s.  We also discussed an anxiolytic prior to transvag u/s or conscious sedation .  We will try abd  u/s first.  Pt's mom will think about transvag u/s. Orders: Urinalysis-[facility] (00000) U Preg-[facility] (81025) Ultrasound (Ultrasound) [place]- Est Level  3 [id number])  Problem # 2:  HYPERTENSION (ICD-401.9)  BP checks at home 130s - 140s.  Will start metoprolol since pt has sinus tachy and hr in 100s.  Pt's hr should be able to tolerate bb.  Will d/c diltiazem.  Pt to rtc in 4-6 wks for recheck since we jsut started new med. The following  medications were removed from the medication list:    Diltiazem Hcl 120 Mg Tabs (Diltiazem hcl) .Marland Kitchen... 1 tab by mouth two times a day Her updated medication list for this problem includes:    Metoprolol Tartrate 50 Mg Tabs (Metoprolol tartrate) .Marland Kitchen... 1 tab by mouth bid  Orders: [facility]- Est Level  3 [id number])  Complete Medication List: 1)  Ferrous Sulfate 325 (65 Fe) Mg Tabs (Ferrous sulfate) .... One tablet by mouth two times a day 2)  Seroquel 300 Mg Tabs (Quetiapine fumarate) .... 2 tabs by mouth at bedtime per dr senna 3)  Metoprolol Tartrate 50 Mg Tabs (Metoprolol tartrate) .Marland Kitchen.. 1 tab by mouth bid 4)  Luvox Cr 100 Mg Xr24h-cap (Fluvoxamine maleate) .... Increase slowly to 2 tab two times a day per dr  Arbie Cookey senna  Patient Instructions: 1)  Please schedule a follow-up appointment in 4-6 weeks for blood pressure.  2)  Stop taking Diltiazem. 3)  New blood pressure medicine: Metoprolol 50mg  take this twice a day. Prescriptions: METOPROLOL TARTRATE 50 MG TABS (METOPROLOL TARTRATE) 1 tab by mouth bid  #66 x 3   Entered and Authorized by:   Merla Riches MD   Signed by:   Merla Riches MD on 05/28/2009   Method used:   Electronically to        [facility]. [id number]* (retail)       [place].       [place],   [address]       Ph: [phone] or [phone]       Fax: [phone]   RxID:   [id number]    Prevention & Chronic Care Immunizations   Influenza vaccine: Fluvax MCR  (03/12/2009)    Tetanus booster: 05/16/2008: Tdap    Pneumococcal vaccine: Not documented  Other Screening   Pap smear: Not documented   Pap smear action/deferral: Not indicated-other  (05/28/2009)   Smoking status: never  (05/28/2009)  Hypertension   Last Blood Pressure: 132 / 84  (05/28/2009)   Serum creatinine: 0.91  (02/26/2009)   Serum potassium 4.6  (02/26/2009)    Hypertension flowsheet reviewed?: Yes   Progress toward BP goal: At goal  Self-Management Support :     Hypertension self-management support: Not documented    Laboratory Results   Urine Tests  Date/Time Received: May 28, 2009 9:30 AM  Date/Time Reported: May 28, 2009 10:02 AM   Routine Urinalysis   Color: yellow Appearance: Clear Glucose: negative   (Normal Range: Negative) Bilirubin: negative   (Normal Range: Negative) Ketone: negative   (Normal Range: Negative) Spec. Gravity: >=1.030   (Normal Range: 1.003-1.035) Blood: negative   (Normal Range: Negative) pH: 5.5   (Normal Range: 5.0-8.0) Protein: 30   (Normal Range: Negative) Urobilinogen: 0.2   (Normal Range: 0-1) Nitrite: negative   (Normal Range: Negative) Leukocyte Esterace: negative   (Normal Range: Negative)  Urine Microscopic WBC/HPF: rare Bacteria/HPF: 2+ Epithelial/HPF: 1-5    Urine HCG:  negative Comments: ...............test performed by......Marland KitchenBonnie A. Martinique, MLS (ASCP)cm

## 2010-05-26 NOTE — Assessment & Plan Note (Signed)
Summary: f/u,df   Vital Signs:  Patient profile:   25 year old female Height:      64.5 inches Weight:      207 pounds BMI:     35.11 Temp:     98.3 degrees F oral Pulse rate:   72 / minute BP sitting:   126 / 83  (left arm) Cuff size:   regular  Vitals Entered By: Schuyler Amor CMA (November 05, 2009 8:40 AM) CC: F/U Pain Assessment Patient in pain? no        Primary Care Provider:  Jossalyn Forgione MD  CC:  F/U.  History of Present Illness: 25 y/o F here for f/u of   Eczema.  Pt was seen on 6/21 for pruritic rash on legs, arms.  Was dx was eczema and Rx Triamcionolone and Hydroxyzine.  Both pt and mom state that itchy rash is improved.  Mom would like more refill of this medication.    Hypothyroidism: started on levothyrozine 72mcg shortly after 6/22.  Doing well on medication.  Mom has not noticed any adverse effects fromm being on the medication.    Dysuria:  during ROS, it seems sometimes pt feels that she does not feel that she empties her bladder all the way.  Denies pain with urination or frequency.  States that after she voids, she still feel that she needs to void again.  She has complained of this before but refused pelvic exam.    Current Medications (verified): 1)  Ferrous Sulfate 325 (65 Fe) Mg Tabs (Ferrous Sulfate) .... One Tablet By Mouth Two Times A Day 2)  Seroquel 300 Mg Tabs (Quetiapine Fumarate) .Marland Kitchen.. 1 Tab By Mouth At Bedtime 3)  Metoprolol Tartrate 50 Mg Tabs (Metoprolol Tartrate) .Marland Kitchen.. 1 Tab By Mouth Bid 4)  Sertraline Hcl 100 Mg Tabs (Sertraline Hcl) .Marland Kitchen.. 1 and 1/2 Tabs By Mouth Daily 5)  Abilify 10 Mg Tabs (Aripiprazole) .Marland Kitchen.. 1 Tab By Mouth Qam 6)  Divalproex Sodium 500 Mg Tbec (Divalproex Sodium) .... 2 Tabs By Mouth Qhs 7)  Pantoprazole Sodium 40 Mg Tbec (Pantoprazole Sodium) .Marland Kitchen.. 1 Tab By Mouth Two Times A Day For 3 Days and Then Daily 8)  Colace 100 Mg Caps (Docusate Sodium) .Marland Kitchen.. 1 Tab By Mouth Two Times A Day 9)  Triamcinolone Acetonide 0.5 % Crea  (Triamcinolone Acetonide) .... Apply To Affected Areas Two Times A Day.  Dispense 60 Grams. 10)  Hydroxyzine Hcl 25 Mg Tabs (Hydroxyzine Hcl) .Marland Kitchen.. 1 Tab By Mouth Once A Day As Needed For Itchiness 11)  Synthroid 50 Mcg Tabs (Levothyroxine Sodium) .Marland Kitchen.. 1 Tab By Mouth Daily.  Please Use The Same Manufacturer/brand On Refills.  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   Subclinical Hypothyroidism Hypertension Obesity Iron Def Anemia  Past Surgical History: Last updated: 06/23/2006 Cholecystectomy - 11/24/2004  Family History: Last updated: 05/16/2008 Diabetes:  maternal grandparents HTN:  mother, maternal grandmother CAD:  maternal great grandmother Bladder CA: maternal grandmother  Social History: Last updated: 02/26/2009 Lives with Mother; No tobacco, ETOH; VERY sedentary.  Never sexually active.  Risk Factors: Caffeine Use: 0 (05/16/2008) Exercise: no (05/16/2008)  Risk Factors: Smoking Status: never (10/14/2009) Passive Smoke Exposure: no (05/16/2008)  Review of Systems       The patient complains of weight gain.  The patient denies fever, weight loss, hoarseness, chest pain, syncope, dyspnea  on exertion, headaches, abdominal pain, melena, severe indigestion/heartburn, hematuria, muscle weakness, unusual weight change, and abnormal bleeding.         BM every other day, which is normal for her.    Physical Exam  General:  Well-developed,well-nourished,in no acute distress; alert,appropriate and cooperative throughout examination. vitals reviewed.   Head:  normocephalic and atraumatic.   Neck:  No deformities, masses, or tenderness noted. Lungs:  Normal respiratory effort, chest expands symmetrically. Lungs are clear to auscultation, no crackles or wheezes. Heart:  Normal rate and regular rhythm. S1 and S2 normal  without gallop, murmur, click, rub or other extra sounds. Abdomen:  Bowel sounds positive,abdomen soft and non-tender without masses, organomegaly or hernias noted. Genitalia:  pt refused pelvic eam Pulses:  R and L carotid,radia pulses are full and equal bilaterally Extremities:  No clubbing, cyanosis, edema, or deformity noted with normal full range of motion of all joints.   Skin:  NO excoriated skin.   Cervical Nodes:  No lymphadenopathy noted Axillary Nodes:  No palpable lymphadenopathy   Impression & Recommendations:  Problem # 1:  PRURITUS (ICD-698.9) Assessment Improved Eczema improved with Triamcinolone.  No excoriated areas on skin.  there are areas of old dried scabs on arms and legs.  back and abd ok.   Orders: [place]- Est  Level 4 [id number])  Problem # 2:  HYPOTHYROIDISM (ICD-244.9) Assessment: Unchanged Will continue current med.  Recheck TSH in Aug.   Her updated medication list for this problem includes:    Synthroid 50 Mcg Tabs (Levothyroxine sodium) .Marland Kitchen... 1 tab by mouth daily.  please use the same manufacturer/brand on refills.  Orders: [facility]- Est  Level 4 (99214)Future Orders: TSH-[facility] [id number]) ... 11/13/2010  Problem # 3:  DYSURIA (ICD-788.1) Assessment: New Pt has complained of feelings of inability to empty bladder before.  We do not have bladder scan in clinic.  Will check UA today.  Will also get UCx.  If normal will most likely need bladder scan.  Pt refused pelvic exam.  She has refused transvag u/s inpast .   Orders: Urinalysis-[facility] (00000) Urine Culture-[facility] [id number]) [place]- Est  Level 4 [id number])  Complete Medication List: 1)  Ferrous Sulfate 325 (65 Fe) Mg Tabs (Ferrous sulfate) .... One tablet by mouth two times a day 2)  Seroquel 300 Mg Tabs (Quetiapine fumarate) .Marland Kitchen.. 1 tab by mouth at bedtime 3)  Metoprolol Tartrate 50 Mg Tabs (Metoprolol tartrate) .Marland Kitchen.. 1 tab by mouth bid 4)  Sertraline Hcl 100 Mg Tabs (Sertraline hcl) .Marland Kitchen.. 1 and 1/2 tabs by mouth  daily 5)  Abilify 10 Mg Tabs (Aripiprazole) .Marland Kitchen.. 1 tab by mouth qam 6)  Divalproex Sodium 500 Mg Tbec (Divalproex sodium) .... 2 tabs by mouth qhs 7)  Pantoprazole Sodium 40 Mg Tbec (Pantoprazole sodium) .Marland Kitchen.. 1 tab by mouth two times a day for 3 days and then daily 8)  Colace 100 Mg Caps (Docusate sodium) .Marland Kitchen.. 1 tab by mouth two times a day 9)  Triamcinolone Acetonide 0.5 % Crea (Triamcinolone acetonide) .... Apply to affected areas two times a day.  dispense 60 grams. 10)  Hydroxyzine Hcl 25 Mg Tabs (Hydroxyzine hcl) .Marland Kitchen.. 1 tab by mouth once a day as needed for itchiness 11)  Synthroid 50 Mcg Tabs (Levothyroxine sodium) .Marland Kitchen.. 1 tab by mouth daily.  please use the same manufacturer/brand on refills.  Other Orders: Future Orders: [facility] [id number]) ... 11/06/2010  Patient Instructions: 1)  Please schedule a follow-up appointment for lab for mid August to check  thyroid. 2)  Please schedule a follow-up appointment in 3 months with Dr Earley Favor.  3)  Continue all your medications as directed. Prescriptions: TRIAMCINOLONE ACETONIDE 0.5 % CREA (TRIAMCINOLONE ACETONIDE) apply to affected areas two times a day.  Dispense 60 grams.  #1 x 3   Entered and Authorized by:   Merla Riches MD   Signed by:   Merla Riches MD on 11/05/2009   Method used:   Electronically to        [facility]. [id number]* (retail)       [address].       [place], [place]  [address]       Ph: [phone] or [phone]       Fax: [phone]   RxID:   [id number]   Laboratory Results   Urine Tests  Date/Time Received: November 05, 2009 9:25 AM  Date/Time Reported: November 05, 2009 9:44 AM   Routine Urinalysis   Color: yellow Appearance: Clear Glucose: negative   (Normal Range: Negative) Bilirubin: negative   (Normal Range: Negative) Ketone: negative   (Normal Range: Negative) Spec. Gravity: >=1.030   (Normal Range: 1.003-1.035) Blood: negative   (Normal Range: Negative) pH: 6.0   (Normal Range:  5.0-8.0) Protein: 30   (Normal Range: Negative) Urobilinogen: 0.2   (Normal Range: 0-1) Nitrite: negative   (Normal Range: Negative) Leukocyte Esterace: negative   (Normal Range: Negative)  Urine Microscopic WBC/HPF: 0-3 RBC/HPF: rare Bacteria/HPF: 1+ Epithelial/HPF: 1-5 Other: small amorphous    Comments: ...........test performed by...........Marland KitchenHedy Camara, CMA

## 2010-05-26 NOTE — Assessment & Plan Note (Signed)
Summary: f/u,df   Vital Signs:  Patient profile:   25 year old female Height:      64.5 inches Weight:      205 pounds BMI:     34.77 BSA:     1.99 Temp:     98.1 degrees F Pulse rate:   91 / minute BP sitting:   132 / 82  Vitals Entered By: Christen Bame CMA (October 14, 2009 8:39 AM) CC: f/u Is Patient Diabetic? No Pain Assessment Patient in pain? no        Primary Care Provider:  Monaye Blackie MD  CC:  f/u.  History of Present Illness: 25 y/o F here for   Hypothyroid:  TSH was elevated with last check 13.7, but T3,T4 normal.  Pt is here for recheck of labs no palpitaions constipation sometimes appetite regular energy level: not good, not exercising, spends most days in bed or on couch weight: gained 2 lbs since May  Itchy skin: Pt has not complained of itchy skin to mom. Last weej Mom noticed her scratching herself on her arms and legs.  Mom checked and saw excoritated skin.  Pt cannot tell me when this started.  No new soaps, detergents, shampoos, foods, etc.       Habits & Providers  Alcohol-Tobacco-Diet     Tobacco Status: never  Current Medications (verified): 1)  Ferrous Sulfate 325 (65 Fe) Mg Tabs (Ferrous Sulfate) .... One Tablet By Mouth Two Times A Day 2)  Seroquel 300 Mg Tabs (Quetiapine Fumarate) .Marland Kitchen.. 1 Tab By Mouth At Bedtime 3)  Metoprolol Tartrate 50 Mg Tabs (Metoprolol Tartrate) .Marland Kitchen.. 1 Tab By Mouth Bid 4)  Sertraline Hcl 100 Mg Tabs (Sertraline Hcl) .Marland Kitchen.. 1 and 1/2 Tabs By Mouth Daily 5)  Abilify 10 Mg Tabs (Aripiprazole) .Marland Kitchen.. 1 Tab By Mouth Qam 6)  Divalproex Sodium 500 Mg Tbec (Divalproex Sodium) .... 2 Tabs By Mouth Qhs 7)  Pantoprazole Sodium 40 Mg Tbec (Pantoprazole Sodium) .Marland Kitchen.. 1 Tab By Mouth Two Times A Day For 3 Days and Then Daily 8)  Colace 100 Mg Caps (Docusate Sodium) .Marland Kitchen.. 1 Tab By Mouth Two Times A Day 9)  Triamcinolone Acetonide 0.5 % Crea (Triamcinolone Acetonide) .... Apply To Affected Areas Two Times A Day.  Dispense 15 Grams. 10)   Hydroxyzine Hcl 25 Mg Tabs (Hydroxyzine Hcl) .Marland Kitchen.. 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   Subclinical Hypothyroidism Hypertension Obesity Iron Def Anemia  Past Surgical History: Last updated: 06/23/2006 Cholecystectomy - 11/24/2004  Family History: Last updated: 05/16/2008 Diabetes:  maternal grandparents HTN:  mother, maternal grandmother CAD:  maternal great grandmother Bladder CA: maternal grandmother  Social History: Last updated: 02/26/2009 Lives with Mother; No tobacco, ETOH; VERY sedentary.  Never sexually active.  Risk Factors: Caffeine Use: 0 (05/16/2008) Exercise: no (05/16/2008)  Risk Factors: Smoking Status: never (10/14/2009) Passive Smoke Exposure: no (05/16/2008)  Review of Systems       per hpi  Physical Exam  General:  Well-developed,well-nourished,in no acute distress; alert,appropriate and cooperative throughout examination. vitals reviewed.   Head:  normocephalic and atraumatic.   Lungs:  Normal respiratory effort, chest expands symmetrically. Lungs are clear to auscultation, no crackles or wheezes. Heart:  Normal rate and regular rhythm. S1 and S2 normal without gallop, murmur, click, rub or  other extra sounds. Abdomen:  Bowel sounds positive,abdomen soft and non-tender without masses, organomegaly or hernias noted. Extremities:  No clubbing, cyanosis, edema, or deformity noted with normal full range of motion of all joints.   Skin:  Lower back, bilateral upper and lower extremeties:: silvery dry skin, excortiated areas, areas of healing scabs.  NO redness,  no vesicles.  no pustules.     Impression & Recommendations:  Problem # 1:  HYPOTHYROIDISM (ICD-244.9) Assessment Unchanged Subclinical labs and symptoms.  Will recheck TSH, T3,T4  today.   Orders: TSH-[facility] [id number]) Free T3-[facility] [id number]) Free T4-[facility] [phone]) [facility]- Est  Level 4 [id number])  Problem # 2:  PRURITUS [id number]) Assessment: New May be eczema in nature.  Will give Hydroxyzine for pruritis.  Will also give triamcinolone 0.5% cream for two times a day use.  Will rtc in 2-3 wks for f/u. Orders: [facility]- Est  Level 4 [id number])  Complete Medication List: 1)  Ferrous Sulfate 325 (65 Fe) Mg Tabs (Ferrous sulfate) .... One tablet by mouth two times a day 2)  Seroquel 300 Mg Tabs (Quetiapine fumarate) .Marland Kitchen.. 1 tab by mouth at bedtime 3)  Metoprolol Tartrate 50 Mg Tabs (Metoprolol tartrate) .Marland Kitchen.. 1 tab by mouth bid 4)  Sertraline Hcl 100 Mg Tabs (Sertraline hcl) .Marland Kitchen.. 1 and 1/2 tabs by mouth daily 5)  Abilify 10 Mg Tabs (Aripiprazole) .Marland Kitchen.. 1 tab by mouth qam 6)  Divalproex Sodium 500 Mg Tbec (Divalproex sodium) .... 2 tabs by mouth qhs 7)  Pantoprazole Sodium 40 Mg Tbec (Pantoprazole sodium) .Marland Kitchen.. 1 tab by mouth two times a day for 3 days and then daily 8)  Colace 100 Mg Caps (Docusate sodium) .Marland Kitchen.. 1 tab by mouth two times a day 9)  Triamcinolone Acetonide 0.5 % Crea (Triamcinolone acetonide) .... Apply to affected areas two times a day.  dispense 15 grams. 10)  Hydroxyzine Hcl 25 Mg Tabs (Hydroxyzine hcl) .Marland Kitchen.. 1 tab by mouth once a day as needed for itchiness  Patient Instructions: 1)  Please schedule a follow-up appointment in 2-3 weeks.  2)  For itchy skin: Take hydroxyzine tab once a day.  Apply the cream to itchy areas two times a day. Prescriptions: HYDROXYZINE HCL 25 MG TABS (HYDROXYZINE HCL) 1 tab by mouth once a day as needed for itchiness  #30 x 3   Entered and Authorized by:   Merla Riches MD   Signed by:   Merla Riches MD on 10/14/2009   Method used:   Electronically to        [facility]. [id number]* (retail)       [address]       [place], [place]  [address]       Ph: [phone] or [phone]       Fax: [phone]   RxIDAD:[id number] TRIAMCINOLONE ACETONIDE 0.5 % CREA (TRIAMCINOLONE ACETONIDE) apply to affected areas two times a day.  Dispense 15 grams.  #1 x 3   Entered and Authorized by:   Merla Riches MD   Signed by:   Merla Riches MD on 10/14/2009   Method used:   Electronically to        [facility]. [id number]* (retail)       [address]       [place], [place]  [address]       Ph: [phone] or [phone]  Fax: [phone]   RxIDEB:[id number] COLACE 100 MG CAPS (DOCUSATE SODIUM) 1 tab by mouth two times a day  #60 x 6   Entered and Authorized by:   Merla Riches MD   Signed by:   Merla Riches MD on 10/14/2009   Method used:   Electronically to        [facility]. [id number]* (retail)       [address]       [place], [place]  [address]       Ph: [phone] or [phone]       Fax: [phone]   RxID:   [id number]

## 2010-05-28 NOTE — Miscellaneous (Signed)
Summary: medical records request   Clinical Lists Changes  medical records were requested by [facility] for DOS- 04/26/09 to present Vanessa Alvarez  April 10, 2010 3:50 PM

## 2010-05-28 NOTE — Miscellaneous (Signed)
Summary: Change in Meds   Medications Added ABILIFY 15 MG TABS (ARIPIPRAZOLE) 1 tab by mouth QAM per [place] ER 500 MG XR24H-TAB (DIVALPROEX SODIUM) 2 tabs by mouth at bedtime per [facility] Lists Changes  Medications: Changed medication from ABILIFY 10 MG TABS (ARIPIPRAZOLE) 1 tab by mouth qAM to ABILIFY 15 MG TABS (ARIPIPRAZOLE) 1 tab by mouth QAM per [facility] medication from DIVALPROEX SODIUM 500 MG TBEC (DIVALPROEX SODIUM) 2 tabs by mouth qHS to DEPAKOTE ER 500 MG XR24H-TAB (DIVALPROEX SODIUM) 2 tabs by mouth at bedtime per [facility] Observations: Added new observation of PAST MED HX: 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  Saw optometrist 11/09.   Subclinical Hypothyroidism Hypertension Obesity Iron Def Anemia (05/19/2010 13:36) Added new observation of PRIMARY MD: Jacia Sickman MD (05/19/2010 13:36)       Past History:  Past Medical History: 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  Saw optometrist 11/09.   Subclinical Hypothyroidism Hypertension Obesity Iron Def Anemia

## 2010-07-24 ENCOUNTER — Other Ambulatory Visit: Payer: Self-pay | Admitting: Family Medicine

## 2010-07-25 NOTE — Telephone Encounter (Signed)
Refill request

## 2010-08-24 ENCOUNTER — Other Ambulatory Visit: Payer: Self-pay | Admitting: Family Medicine

## 2010-08-24 DIAGNOSIS — E039 Hypothyroidism, unspecified: Secondary | ICD-10-CM

## 2010-08-24 NOTE — Telephone Encounter (Signed)
Refill request

## 2010-09-17 ENCOUNTER — Ambulatory Visit (INDEPENDENT_AMBULATORY_CARE_PROVIDER_SITE_OTHER): Payer: Medicare Other | Admitting: Family Medicine

## 2010-09-17 ENCOUNTER — Other Ambulatory Visit: Payer: Self-pay | Admitting: Family Medicine

## 2010-09-17 ENCOUNTER — Encounter: Payer: Self-pay | Admitting: Family Medicine

## 2010-09-17 DIAGNOSIS — M545 Low back pain, unspecified: Secondary | ICD-10-CM

## 2010-09-17 DIAGNOSIS — E039 Hypothyroidism, unspecified: Secondary | ICD-10-CM

## 2010-09-17 DIAGNOSIS — R7301 Impaired fasting glucose: Secondary | ICD-10-CM

## 2010-09-17 DIAGNOSIS — L2089 Other atopic dermatitis: Secondary | ICD-10-CM

## 2010-09-17 DIAGNOSIS — I10 Essential (primary) hypertension: Secondary | ICD-10-CM

## 2010-09-17 LAB — CBC
HCT: 36 % (ref 36.0–46.0)
Hemoglobin: 11.9 g/dL — ABNORMAL LOW (ref 12.0–15.0)
RBC: 4.61 MIL/uL (ref 3.87–5.11)
WBC: 6.6 10*3/uL (ref 4.0–10.5)

## 2010-09-17 LAB — BASIC METABOLIC PANEL
BUN: 14 mg/dL (ref 6–23)
Chloride: 101 mEq/L (ref 96–112)
Glucose, Bld: 126 mg/dL — ABNORMAL HIGH (ref 70–99)
Potassium: 3.9 mEq/L (ref 3.5–5.3)
Sodium: 137 mEq/L (ref 135–145)

## 2010-09-17 LAB — TSH: TSH: 7.408 u[IU]/mL — ABNORMAL HIGH (ref 0.350–4.500)

## 2010-09-17 MED ORDER — HYDROXYZINE HCL 25 MG PO TABS: ORAL_TABLET | ORAL | Status: DC

## 2010-09-17 MED ORDER — FLUOCINONIDE 0.05 % EX CREA: TOPICAL_CREAM | CUTANEOUS | Status: AC

## 2010-09-17 MED ORDER — HYDROXYZINE HCL 25 MG PO TABS: 25.0000 mg | ORAL_TABLET | Freq: Four times a day (QID) | ORAL | Status: DC | PRN

## 2010-09-17 NOTE — Assessment & Plan Note (Signed)
Triamcinolone not working well.  Will change to Lidex. Will refill atarax today for itching.

## 2010-09-17 NOTE — Assessment & Plan Note (Signed)
Will check TSH today.  Last check was 01/2010 when TSH was 4.9.  Will continue current dose of Levothyroxine.  69mcg until result. Pt asymptomatic.

## 2010-09-17 NOTE — Progress Notes (Signed)
  Subjective:    Patient ID: Vanessa Alvarez, female    DOB: 14-Aug-1985, 25 y.o.   MRN: [id number]  HPI Back Pain: Patient presents for presents evaluation of low back problems.  Symptoms have been present for one month and include achy pain. Initial inciting event: none. Symptoms are worst: pain stops when she stops walking. Alleviating factors identifiable by patient are none. Exacerbating factors identifiable by patient are walking. Treatments so far initiated by patient: none Previous lower back problems: scoliosis. Previous workup: none. Previous treatments: none. Pt has back pain whenever she walks.  Her back hurts walking to the dumpster, which is less than half a block away.  Mom states that it is preventing her from walking for exercise.  Pain would stop as soon as she stops walking.  Pain is located in general lower back.  No urinary or bowel problems. No pararesthesia.  No saddle paresthesia.  No radiation to leg. No fever, chills, neck pain,  No history of cancer or immunosuppressant use.  No history of injury.  Eczema: Pt has been scratching her arms, back, legs.  Triamcionolone cream is not working well anymore.  She has not taken any Atarax to help with itching.   Hypothyroidism: Taking levothyroxine 88 mcg daily.  She denies palpitations, heat/cold intolerance, constipation/diarrhea, LE edema, changes in MS.   Review of Systems Per hpi     Objective:   Physical Exam  Constitutional: She appears well-developed and well-nourished. No distress.  HENT:  Head: Normocephalic and atraumatic.  Neck: Normal range of motion. Neck supple. No thyromegaly present.  Musculoskeletal:       Cervical back: She exhibits normal range of motion, no tenderness, no bony tenderness, no swelling, no edema, no deformity, no laceration, no pain and no spasm.       Thoracic back: She exhibits normal range of motion, no tenderness, no bony tenderness, no swelling, no edema, no deformity, no laceration, no  pain and no spasm.       Lumbar back: Normal. She exhibits normal range of motion, no tenderness, no bony tenderness, no swelling, no edema, no deformity, no laceration, no pain and no spasm.       Full ROM.  Strength intact to LE and UE.  Reflexes wnl.  Stork test negative.  FABER test negative. Sitting and Lying straight and cross legs negative.   Hip ROM intact (flexion, extension, abduction, adduction). Sensory intact.   Lymphadenopathy:    She has no cervical adenopathy.  Skin: Skin is warm and dry. Rash noted. No bruising, no ecchymosis and no laceration noted. No erythema.          Scaly eczematous scarring on both arms and R lower leg and middle back.            Assessment & Plan:

## 2010-09-17 NOTE — Assessment & Plan Note (Signed)
Low back pain with walking x 1 month.  No red flags on ROS. Exam wnl.  Gave handout on back exercises to strengthen core.  I think that pt's sedentary lifestyle may be causing pain. Advised her to continue walking.

## 2010-09-17 NOTE — Patient Instructions (Signed)
Please make appt to come back in 3 months.   Back Exercises   These exercises may help you when beginning to rehabilitate your injury. Your symptoms may resolve with or without further involvement from your physician, physical therapist or athletic trainer. While completing these exercises, remember:   Restoring tissue flexibility helps normal motion to return to the joints. This allows healthier, less painful movement and activity.  An effective stretch should be held for at least 30 seconds.  A stretch should never be painful. You should only feel a gentle lengthening or release in the stretched tissue.   STRETCH - Extension, Prone on Elbows   Lie on your stomach on the floor, a bed will be too soft. Place your palms about shoulder width apart and at the height of your head.  Place your elbows under your shoulders. If this is too painful, stack pillows under your chest.  Allow your body to relax so that your hips drop lower and make contact more completely with the floor.  Hold this position for _10_ seconds.  Slowly return to lying flat on the floor. Repeat _10  times. Complete this exercise 3-4 times per day.      RANGE OF MOTION - Extension, Prone Press Ups   Lie on your stomach on the floor, a bed will be too soft. Place your palms about shoulder width apart and at the height of your head.  Keeping your back as relaxed as possible, slowly straighten your elbows while keeping your hips on the floor. You may adjust the placement of your hands to maximize your comfort. As you gain motion, your hands will come more underneath your shoulders.  Hold this position __________ seconds.  Slowly return to lying flat on the floor. Repeat __________ times. Complete this exercise __________ times per day.        RANGE OF MOTION- Quadruped, Neutral Spine   Assume a hands and knees position on a firm surface. Keep your hands under your shoulders and your knees under your hips. You may  place padding under your knees for comfort.    Drop your head and point your tail bone toward the ground below you.  This will round out your low back like an angry Jazzmin Newbold. Hold this position for __________ seconds.   Slowly lift your head and release your tail bone so that your back sags into a large arch, like an old horse.  Hold this position for __________ seconds.   Repeat this until you feel limber in your low back.  Now, find your "sweet spot." This will be the most comfortable position somewhere between the two previous positions. This is your neutral spine. Once you have found this position, tense your stomach muscles to support your low back.  Hold this position for __________ seconds. Repeat __________ times.  Complete this exercise __________ times per day.      STRETCH - Flexion, Single Knee to Chest   Lie on a firm bed or floor with both legs extended in front of you.  Keeping one leg in contact with the floor, bring your opposite knee to your chest. Hold your leg in place by either grabbing behind your thigh or at your knee.  Pull until you feel a gentle stretch in your low back. Hold __________ seconds.  Slowly release your grasp and repeat the exercise with the opposite side. Repeat __________ times. Complete this exercise __________ times per day.     STRETCH - Hamstrings, Standing  Stand or sit and extend your __________ leg, placing your foot on a chair or foot stool  Keeping a slight arch in your low back and your hips straight forward.    Lead with your chest and lean forward at the waist until you feel a gentle stretch in the back of your __________ knee or thigh. (When done correctly, this exercise requires leaning only a small distance.)  Hold this position for __________ seconds. Repeat __________ times. Complete this stretch __________ times per day.     STRENGTHENING - Deep Abdominals, Pelvic Tilt   Lie on a firm bed or floor. Keeping your legs in  front of you, bend your knees so they are both pointed toward the ceiling and your feet are flat on the floor.  Tense your lower abdominal muscles to press your low back into the floor. This motion will rotate your pelvis so that your tail bone is scooping upwards rather than pointing at your feet or into the floor.  With a gentle tension and even breathing, hold this position for __________ seconds. Repeat __________ times. Complete this exercise __________ times per day.     STRENGTHENING - Abdominals, Crunches   Lie on a firm bed or floor. Keeping your legs in front of you, bend your knees so they are both pointed toward the ceiling and your feet are flat on the floor. Cross your arms over your chest.    Slightly tip your chin down without bending your neck.  Tense your abdominals and slowly lift your trunk high enough to just clear your shoulder blades. Lifting higher can put excessive stress on the low back and does not further strengthen your abdominal muscles.  Control your return to the starting position. Repeat __________ times. Complete this exercise __________ times per day.      STRENGTHENING - Quadruped, Opposite UE/LE Lift   Assume a hands and knees position on a firm surface. Keep your hands under your shoulders and your knees under your hips.  You may place padding under your knees for comfort.    Find your neutral spine and gently tense your abdominal muscles so that you can maintain this position. Your shoulders and hips should form a rectangle that is parallel with the floor and is not twisted.   Keeping your trunk steady, lift your right hand no higher than your shoulder and then your left leg no higher than your hip. Make sure you are not holding your breath. Hold this position __________ seconds.  Continuing to keep your abdominal muscles tense and your back steady, slowly return to your starting position. Repeat with the opposite arm and leg. Repeat __________  times. Complete this exercise __________ times per day.   Document Released: 04/30/2005  Document Re-Released: 03/31/2009 [facility] Patient Information 2011 [place].   Back Exercises Back exercises help treat and prevent back injuries. The goal of back exercises is to increase the strength of your abdominal and back muscles and the flexibility of your back. These exercises should be started when you no longer have back pain. Back exercises include: 1. Pelvic Tilt - Lie on your back with your knees bent. Tilt your pelvis until the lower part of your back is against the floor. Hold this position 5-10 sec and repeat 5-10 times.  2. Knee to Chest - Pull first one knee up against your chest and hold for 20-30 seconds, repeat this with the other knee, and then both knees. This may be done with the  other leg straight or bent, whichever feels better.  3. Sit-Ups or Curl-Ups - Bend your knees 90 degrees. Start with tilting your pelvis, and do a partial, slow sit-up, lifting your trunk only 30-45 degrees off the floor. Take at least 2-3 sec for each sit-up. Do not do sit-ups with your knees out straight. If partial sit-ups are difficult, simply do the above but with only tightening your abdominal muscles and holding it as directed.  4. Hip-Lift - Lie on your back with your knees flexed 90 degrees. Push down with your feet and shoulders as you raise your hips a couple inches off the floor; hold for 10 sec, repeat 5-10 times.  5. Back arches - Lie on your stomach, propping yourself up on bent elbows. Slowly press on your hands, causing an arch in your low back. Repeat 3-5 times. Any initial stiffness and discomfort should lessen with repetition over time.  6. Shoulder-Lifts - Lie face down with arms beside your body. Keep hips and torso pressed to floor as you slowly lift your head and shoulders off the floor.  Do not overdo your exercises, especially in the beginning. Exercises may cause you some mild back  discomfort which lasts for a few minutes; however, if the pain is more severe, or lasts for more than 15 minutes, do not continue exercises until you see your caregiver. Improvement with exercise therapy for back problems is slow.  See your caregivers for assistance with developing a proper back exercise program. Document Released: 05/20/2004 Document Re-Released: 07/09/2008 [facility] Patient Information 2011 [place].

## 2010-09-18 MED ORDER — LEVOTHYROXINE SODIUM 100 MCG PO TABS: 100.0000 ug | ORAL_TABLET | Freq: Every day | ORAL | Status: DC

## 2010-09-18 NOTE — Progress Notes (Signed)
Addended by: Sybilla Malhotra P on: 09/18/2010 08:40 AM   Modules accepted: Orders

## 2010-09-25 ENCOUNTER — Other Ambulatory Visit (INDEPENDENT_AMBULATORY_CARE_PROVIDER_SITE_OTHER): Payer: Medicare Other

## 2010-09-25 DIAGNOSIS — R7301 Impaired fasting glucose: Secondary | ICD-10-CM

## 2010-09-25 LAB — POCT GLYCOSYLATED HEMOGLOBIN (HGB A1C): Hemoglobin A1C: 5.9

## 2010-09-25 NOTE — Progress Notes (Signed)
A1c done today.

## 2010-10-07 ENCOUNTER — Telehealth: Payer: Self-pay | Admitting: Family Medicine

## 2010-10-07 NOTE — Telephone Encounter (Signed)
Left message for patient to return call.Philbert Ocallaghan, Kevin Fenton

## 2010-10-07 NOTE — Telephone Encounter (Signed)
Vanessa Alvarez called to find out what the results of the blood test were.  She said that when you call her back, it is ok to leave the results in her voicemail.

## 2011-02-18 ENCOUNTER — Other Ambulatory Visit: Payer: Self-pay | Admitting: Family Medicine

## 2011-02-18 MED ORDER — METOPROLOL TARTRATE 50 MG PO TABS: 50.0000 mg | ORAL_TABLET | Freq: Two times a day (BID) | ORAL | Status: DC

## 2011-03-20 ENCOUNTER — Other Ambulatory Visit: Payer: Self-pay | Admitting: Family Medicine

## 2011-03-20 DIAGNOSIS — E039 Hypothyroidism, unspecified: Secondary | ICD-10-CM

## 2011-03-20 MED ORDER — METOPROLOL TARTRATE 50 MG PO TABS: 50.0000 mg | ORAL_TABLET | Freq: Two times a day (BID) | ORAL | Status: DC

## 2011-03-20 MED ORDER — LEVOTHYROXINE SODIUM 100 MCG PO TABS: 100.0000 ug | ORAL_TABLET | Freq: Every day | ORAL | Status: DC

## 2011-05-13 ENCOUNTER — Ambulatory Visit (INDEPENDENT_AMBULATORY_CARE_PROVIDER_SITE_OTHER): Payer: Medicare Other | Admitting: Family Medicine

## 2011-05-13 VITALS — BP 138/96 | HR 87 | Temp 98.7°F | Ht 64.5 in | Wt 222.0 lb

## 2011-05-13 DIAGNOSIS — R300 Dysuria: Secondary | ICD-10-CM

## 2011-05-13 DIAGNOSIS — L299 Pruritus, unspecified: Secondary | ICD-10-CM

## 2011-05-13 LAB — POCT URINALYSIS DIPSTICK
Blood, UA: NEGATIVE
Nitrite, UA: NEGATIVE
Protein, UA: 100
Spec Grav, UA: >=1.03
Urobilinogen, UA: 0.2
pH, UA: 6

## 2011-05-13 LAB — POCT UA - MICROSCOPIC ONLY

## 2011-05-13 MED ORDER — HYDROCORTISONE 1 % EX OINT: TOPICAL_OINTMENT | Freq: Two times a day (BID) | CUTANEOUS | Status: DC

## 2011-05-13 NOTE — Patient Instructions (Addendum)
For skin dryness/scabbing: Stop itching! Use atarax at least at nighttime to help with itching.  Use Eucerin cream 2 x day on legs/arms/back everywhere that itches.   On lower back area use steroid cream 2 x day for 7 days.  Then use eucerin cream 2 x day.    Return for annual physical exam in the next month and we will make sure that this is improving.

## 2011-05-17 DIAGNOSIS — L299 Pruritus, unspecified: Secondary | ICD-10-CM | POA: Insufficient documentation

## 2011-05-17 NOTE — Progress Notes (Signed)
  Subjective:    Patient ID: Vanessa Alvarez, female    DOB: Aug 28, 1985, 26 y.o.   MRN: [id number]  Headache   Dysuria    Itching skin: Pt has had itching on arms, legs, back and shoulders for over 1 year.  States that this is not better and it is bothering her.  Was seen by dr. ta in the past and advised to take atarax as needed and use steroid cream as needed.  Pt has not used either of these agents.  Has forgotten to take the atarax.  Doesn't like to put on lotion because it makes her hands greasy.  Pt has scratch marks on lower back and upper arm areas where she has scratched herself from itching.  No drainage.  No fever.  No rash in these areas.     Review of Systems  Genitourinary: Positive for dysuria.  Neurological: Positive for headaches.   As per above.     Objective:   Physical Exam  Constitutional: She appears well-developed and well-nourished.       obese  HENT:  Head: Normocephalic and atraumatic.  Cardiovascular: Normal rate.   Pulmonary/Chest: Effort normal.  Skin: No rash noted.       Excoriations/scratch marks on shoulder area and lower back.  Also areas of hyperpigmented linear lines on shins, shoulders, arms, lower back areas.-- appears to be healed areas from previous trauma.  Psychiatric:       Flat affect. Some delay in answering questions. But answers questions appropriately.           Assessment & Plan:

## 2011-05-17 NOTE — Assessment & Plan Note (Signed)
Unsure of original cause of itching, but most likely this is now a habit and skin irritation and symptoms have been made worse due to chronic itching.   Discussed this with pt.  Pt and mother state understanding.  On areas of severe itching (lower back currently) pt to use steroid cream bid x 7 days.  On all other areas of body pt is to use eucerin cream 2 x per day or more frequently as needed for skin discomfort.  Atarax tabs as needed for itching.  Reviewed side effects of this medicine. Also discussed that to help with hand greasiness she simply needs to wash her hands after applying the lotion to her body.  Pt states understanding.  Mother present for entire visit.   Pt to return for annual physical exam will f/up on this issue at that appt.

## 2011-06-30 ENCOUNTER — Encounter: Payer: Medicare Other | Admitting: Family Medicine

## 2011-07-08 ENCOUNTER — Encounter: Payer: Self-pay | Admitting: Family Medicine

## 2011-07-08 ENCOUNTER — Ambulatory Visit (INDEPENDENT_AMBULATORY_CARE_PROVIDER_SITE_OTHER): Payer: Medicare Other | Admitting: Family Medicine

## 2011-07-08 VITALS — BP 120/76 | Temp 99.3°F | Ht 64.5 in | Wt 219.6 lb

## 2011-07-08 DIAGNOSIS — Z0000 Encounter for general adult medical examination without abnormal findings: Secondary | ICD-10-CM

## 2011-07-08 DIAGNOSIS — R51 Headache: Secondary | ICD-10-CM

## 2011-07-08 DIAGNOSIS — E039 Hypothyroidism, unspecified: Secondary | ICD-10-CM

## 2011-07-08 DIAGNOSIS — R7301 Impaired fasting glucose: Secondary | ICD-10-CM

## 2011-07-08 DIAGNOSIS — E663 Overweight: Secondary | ICD-10-CM

## 2011-07-08 DIAGNOSIS — I10 Essential (primary) hypertension: Secondary | ICD-10-CM

## 2011-07-08 DIAGNOSIS — R7309 Other abnormal glucose: Secondary | ICD-10-CM

## 2011-07-08 DIAGNOSIS — E669 Obesity, unspecified: Secondary | ICD-10-CM

## 2011-07-08 LAB — BASIC METABOLIC PANEL
CO2: 26 mEq/L (ref 19–32)
Calcium: 9.3 mg/dL (ref 8.4–10.5)
Creat: 0.99 mg/dL (ref 0.50–1.10)
Glucose, Bld: 84 mg/dL (ref 70–99)

## 2011-07-08 LAB — POCT GLYCOSYLATED HEMOGLOBIN (HGB A1C): Hemoglobin A1C: 5.8

## 2011-07-08 NOTE — Patient Instructions (Signed)
Cold symptoms: Your body will fight off this virus-- things that will help your symptoms:  Sore throat- throat lozenges, salt water gargle, chloraseptic spray as needed. Nasal congestion- saline nasal spray Cough- mucinex  Headache- Keep headache log and return in 1-2 months.  You can take ibuprofen 1-2 x per week for severe headache. New or worsening symptoms you should come back sooner.  Weight management: Goal is to dance for 10 minutes per day.   Lab results: I will mail to you.

## 2011-07-08 NOTE — Progress Notes (Signed)
  Subjective:    Patient ID: Vanessa Alvarez, female    DOB: [birth year], 26 y.o.   MRN: [id number]  HPI Patient here for physical exam:  Cold symptoms: Positive nausea, positive sore throat, positive cough, positive runny nose x3 days. No fever. No nausea. No diarrhea. No vomiting. Taking NyQuil as needed for cough. Without much improvement.  Headache: Patient reports for the past few months, headaches approximately one time per week. Advil does seem to help these resolve. No noises sensitivity. No light sensitivity. No vomiting with headaches. Does not have a history of recurrent headaches. Has not been keeping a log book of these headaches. Patient would like to know why she's having these headaches. Seems to improve when she takes a nap. Headaches come at different times a day. No changes in vision. No dizziness.  Health maintenance screening: Patient blood glucose elevated greater than 125 at last visit-we'll repeat A1c today. Patient states she does not want Pap smear done-mother agrees. Not sexually active. Patient refuses flu vaccine.- Patient agrees to lipid panel today. Does not want guardasil at this time- since she is low risk.  Hypothyroidism: Patient takes levothyroxine as directed. Patient due for thyroid recheck. We'll do this today. Patient does for with weight loss. No hair loss. Normal energy level. No palpitations. No problems with constipation or diarrhea.  Blood pressure followup: Blood pressure 120/76. Taking blood pressure medications as directed. No dizziness. Headaches as per above. No syncope.   Review of Systems As per above.    Objective:   Physical Exam  Constitutional: She is oriented to person, place, and time. She appears well-developed and well-nourished. No distress.       obese  HENT:  Head: Normocephalic and atraumatic.  Mouth/Throat: Oropharynx is clear and moist. No oropharyngeal exudate.       + clear nasal discharge  Eyes: Right eye exhibits no  discharge. Left eye exhibits no discharge.  Neck: Normal range of motion. No thyromegaly present.       + acanthosis nigricans  Cardiovascular: Normal rate, regular rhythm and normal heart sounds.   No murmur heard. Pulmonary/Chest: Effort normal. No respiratory distress. She has no wheezes. She has no rales. She exhibits no tenderness.  Abdominal: Soft. She exhibits no distension.  Musculoskeletal: She exhibits no edema.  Neurological: She is alert and oriented to person, place, and time. She has normal reflexes. She displays normal reflexes. No cranial nerve deficit. She exhibits normal muscle tone. Coordination normal.       CN II-XII per patient baseline- + horizontal nystagmus bilateral- per pt baseline.  Some malalignment of left eye on assessment of EOM- this is per pt baseline.   Skin: No rash noted.  Psychiatric: She has a normal mood and affect.          Assessment & Plan:

## 2011-07-09 ENCOUNTER — Encounter: Payer: Self-pay | Admitting: Family Medicine

## 2011-07-09 LAB — TSH: TSH: 2.422 u[IU]/mL (ref 0.350–4.500)

## 2011-07-13 DIAGNOSIS — Z0000 Encounter for general adult medical examination without abnormal findings: Secondary | ICD-10-CM | POA: Insufficient documentation

## 2011-07-13 NOTE — Assessment & Plan Note (Signed)
TSH rechecked today. 

## 2011-07-13 NOTE — Assessment & Plan Note (Signed)
Although pt not sexually active- we recommend pap smear.  Mother/caregiver and pt did not want to have this done today.  Will think about it and schedule and appointment in the future if the decided to agree to pap smear. Up to date on immunizations.

## 2011-07-13 NOTE — Assessment & Plan Note (Signed)
Pt to keep headache logbook and return in 1 month for follow up.  Can use OTC pain reliever as needed for mod-severe headache.  Pt to return sooner if any new or worsening of symptoms.

## 2011-07-13 NOTE — Assessment & Plan Note (Addendum)
Encouraged healthy diet and increase exercise.  Pt exercise goal is to dance 1min per day. Will recheck weight at follow up visit in 1 month.  Will obtain lipid panel today.

## 2011-07-13 NOTE — Assessment & Plan Note (Signed)
A1C reordered today for diabetes screening.

## 2011-07-13 NOTE — Assessment & Plan Note (Signed)
bp wnl at 120/76 today.  Continue bp medication as per current regimen.  bmet rechecked today.

## 2011-07-15 ENCOUNTER — Other Ambulatory Visit: Payer: Self-pay | Admitting: Internal Medicine

## 2011-07-15 NOTE — Telephone Encounter (Signed)
This is prescribed by Dr. Lovena Le- mental health.

## 2011-09-13 ENCOUNTER — Other Ambulatory Visit: Payer: Self-pay | Admitting: Family Medicine

## 2011-12-10 ENCOUNTER — Encounter: Payer: Self-pay | Admitting: Family Medicine

## 2011-12-10 ENCOUNTER — Ambulatory Visit (INDEPENDENT_AMBULATORY_CARE_PROVIDER_SITE_OTHER): Payer: Medicare Other | Admitting: Family Medicine

## 2011-12-10 VITALS — BP 158/96 | HR 111 | Ht 64.5 in | Wt 221.0 lb

## 2011-12-10 DIAGNOSIS — E039 Hypothyroidism, unspecified: Secondary | ICD-10-CM

## 2011-12-10 DIAGNOSIS — I10 Essential (primary) hypertension: Secondary | ICD-10-CM

## 2011-12-10 DIAGNOSIS — L2089 Other atopic dermatitis: Secondary | ICD-10-CM

## 2011-12-10 DIAGNOSIS — F411 Generalized anxiety disorder: Secondary | ICD-10-CM

## 2011-12-10 LAB — COMPREHENSIVE METABOLIC PANEL
AST: 40 U/L — ABNORMAL HIGH (ref 0–37)
Albumin: 3.7 g/dL (ref 3.5–5.2)
Alkaline Phosphatase: 54 U/L (ref 39–117)
Potassium: 4.4 mEq/L (ref 3.5–5.3)
Sodium: 137 mEq/L (ref 135–145)
Total Protein: 7.4 g/dL (ref 6.0–8.3)

## 2011-12-10 LAB — CBC WITH DIFFERENTIAL/PLATELET
Basophils Relative: 0 % (ref 0–1)
Eosinophils Absolute: 0.1 10*3/uL (ref 0.0–0.7)
Hemoglobin: 11.2 g/dL — ABNORMAL LOW (ref 12.0–15.0)
MCH: 24.9 pg — ABNORMAL LOW (ref 26.0–34.0)
MCHC: 33.4 g/dL (ref 30.0–36.0)
Monocytes Relative: 9 % (ref 3–12)
Neutrophils Relative %: 48 % (ref 43–77)

## 2011-12-10 LAB — TSH: TSH: 5.763 u[IU]/mL — ABNORMAL HIGH (ref 0.350–4.500)

## 2011-12-10 MED ORDER — METOPROLOL TARTRATE 50 MG PO TABS: 100.0000 mg | ORAL_TABLET | Freq: Two times a day (BID) | ORAL | Status: DC

## 2011-12-10 MED ORDER — DOXYCYCLINE HYCLATE 100 MG PO TABS: 100.0000 mg | ORAL_TABLET | Freq: Two times a day (BID) | ORAL | Status: AC

## 2011-12-10 MED ORDER — HYDROXYZINE HCL 25 MG PO TABS: ORAL_TABLET | ORAL | Status: DC

## 2011-12-10 MED ORDER — HYDROCORTISONE 2.5 % EX OINT: TOPICAL_OINTMENT | Freq: Two times a day (BID) | CUTANEOUS | Status: DC

## 2011-12-10 NOTE — Assessment & Plan Note (Signed)
Deteriorated due to noncompliance. Concern for some bacterial superinfection on the right hip site. Discussed with patient extensively regarding chronic management with and oil based moisturizer. Hydrocortisone 2.5 % ointment on affected areas for 7-10 days. Doxycycline for bacterial superinfection and anti-inflammatory properties. F/u in 3 weeks or sooner if worsens.

## 2011-12-10 NOTE — Assessment & Plan Note (Signed)
Last TSH normal 6 months ago, now worsened dry skin and HTN. Will recheck TSH and titrate med as needed.

## 2011-12-10 NOTE — Patient Instructions (Addendum)
Nice to meet you. You have ezcema flare. Avoid scented and irritating products. Make sure to use cocoa butter or vaseline on skin daily for moisture and protection. You can use steroid cream on affected areas once or twice daily for 7-10 days. Start antibiotic for the infected area. Increase your metoprolol to 100 mg twice daily. Make an appointment in 3 weeks for check up.  Eczema Atopic dermatitis, or eczema, is an inherited type of sensitive skin. Often people with eczema have a family history of allergies, asthma, or hay fever. It causes a red itchy rash and dry scaly skin. The itchiness may occur before the skin rash and may be very intense. It is not contagious. Eczema is generally worse during the cooler winter months and often improves with the warmth of summer. Eczema usually starts showing signs in infancy. Some children outgrow eczema, but it may last through adulthood. Flare-ups may be caused by:  Eating something or contact with something you are sensitive or allergic to.   Stress.  DIAGNOSIS  The diagnosis of eczema is usually based upon symptoms and medical history. TREATMENT  Eczema cannot be cured, but symptoms usually can be controlled with treatment or avoidance of allergens (things to which you are sensitive or allergic to).  Controlling the itching and scratching.   Use over-the-counter antihistamines as directed for itching. It is especially useful at night when the itching tends to be worse.   Use over-the-counter steroid creams as directed for itching.   Scratching makes the rash and itching worse and may cause impetigo (a skin infection) if fingernails are contaminated (dirty).   Keeping the skin well moisturized with creams every day. This will seal in moisture and help prevent dryness. Lotions containing alcohol and water can dry the skin and are not recommended.   Limiting exposure to allergens.   Recognizing situations that cause stress.   Developing a  plan to manage stress.  HOME CARE INSTRUCTIONS   Take prescription and over-the-counter medicines as directed by your caregiver.   Do not use anything on the skin without checking with your caregiver.   Keep baths or showers short (5 minutes) in warm (not hot) water. Use mild cleansers for bathing. You may add non-perfumed bath oil to the bath water. It is best to avoid soap and bubble bath.   Immediately after a bath or shower, when the skin is still damp, apply a moisturizing ointment to the entire body. This ointment should be a petroleum ointment. This will seal in moisture and help prevent dryness. The thicker the ointment the better. These should be unscented.   Keep fingernails cut short and wash hands often. If your child has eczema, it may be necessary to put soft gloves or mittens on your child at night.   Dress in clothes made of cotton or cotton blends. Dress lightly, as heat increases itching.   Avoid foods that may cause flare-ups. Common foods include cow's milk, peanut butter, eggs and wheat.   Keep a child with eczema away from anyone with fever blisters. The virus that causes fever blisters (herpes simplex) can cause a serious skin infection in children with eczema.  SEEK MEDICAL CARE IF:   Itching interferes with sleep.   The rash gets worse or is not better within one week following treatment.   The rash looks infected (pus or soft yellow scabs).   You or your child has an oral temperature above 102 F (38.9 C).   Your baby is  older than 3 months with a rectal temperature of 100.5 F (38.1 C) or higher for more than 1 day.   The rash flares up after contact with someone who has fever blisters.  SEEK IMMEDIATE MEDICAL CARE IF:   Your baby is older than 3 months with a rectal temperature of 102 F (38.9 C) or higher.   Your baby is older than 3 months or younger with a rectal temperature of 100.4 F (38 C) or higher.  Document Released: 04/09/2000 Document  Revised: 04/01/2011 Document Reviewed: 02/12/2009 [facility] Patient Information 2012 [place], [place].

## 2011-12-10 NOTE — Assessment & Plan Note (Signed)
Above goal. Mild tachycardia. Check TSH, labs. Increase metoprolol to 100 mg BID and recheck in 3 weeks. Call for problems with medication.

## 2011-12-10 NOTE — Progress Notes (Signed)
  Subjective:    Patient ID: Vanessa Alvarez, female    DOB: Jul 23, 1985, 26 y.o.   MRN: [id number]  HPI presents with mother who helps with history.  1. Skin itching. Has a history of eczema. States she has been itching her skin chronically greater than one year. This has worsened recently, unsure of any triggers. She has scratched the point of bleeding and having some scars on her back. Has an area of broken down skin on her right hip and posterior triceps. She has some tenderness at the right hip site. She does not use any treatment on her skin including moisturizers. There are no new detergents or soaps.   Denies fever, exposures to plants or animal/insects, nausea, edema.  2. HTN. Compliant with medication. No side effects. Does not check at home.  3. Hypothyroid. Compliant with meds most days, infrequently misses dose. +dry skin. No weight changes.  Review of Systems See HPI otherwise negative.  reports that she has never smoked. She does not have any smokeless tobacco history on file.    Objective:   Physical Exam  Vitals reviewed. Constitutional: She appears well-developed and well-nourished. No distress.  HENT:  Head: Normocephalic and atraumatic.  Mouth/Throat: Oropharynx is clear and moist.  Eyes: EOM are normal.  Neck: Neck supple.       Acanthosis nigricans  Cardiovascular: Normal rate, regular rhythm and normal heart sounds.   No murmur heard. Pulmonary/Chest: Effort normal and breath sounds normal.  Musculoskeletal: She exhibits tenderness. She exhibits no edema.  Neurological: She is alert. Coordination normal.  Skin: Rash noted. She is not diaphoretic.       Hypertrophied and thickened skin with old excoriations healing on the posterior arms, lower back/sacral area. Right hip has an excoriated, denuded area ~5cm in circumference with hypopigmentation and erythematous border-slightly tender. Posterior lower legs with scattered small areas of hypertrophic hyperpigmented dry  patches. Skin is very dry globally.   Psychiatric: She has a normal mood and affect.       Assessment & Plan:

## 2011-12-14 ENCOUNTER — Telehealth: Payer: Self-pay | Admitting: Family Medicine

## 2011-12-14 NOTE — Telephone Encounter (Signed)
Patient will be out of Metoprolol by the end of the week and needs a refill sent to [facility] on [address].

## 2011-12-14 NOTE — Telephone Encounter (Signed)
Called and informed pt's EC that Rx has been sent to [facility].Labrian [place], [place]

## 2012-01-07 ENCOUNTER — Ambulatory Visit (INDEPENDENT_AMBULATORY_CARE_PROVIDER_SITE_OTHER): Payer: Medicare Other | Admitting: Family Medicine

## 2012-01-07 VITALS — BP 128/80 | HR 77 | Ht 64.5 in | Wt 217.0 lb

## 2012-01-07 DIAGNOSIS — D509 Iron deficiency anemia, unspecified: Secondary | ICD-10-CM

## 2012-01-07 DIAGNOSIS — I10 Essential (primary) hypertension: Secondary | ICD-10-CM

## 2012-01-07 DIAGNOSIS — L2089 Other atopic dermatitis: Secondary | ICD-10-CM

## 2012-01-07 DIAGNOSIS — R1013 Epigastric pain: Secondary | ICD-10-CM | POA: Insufficient documentation

## 2012-01-07 DIAGNOSIS — E039 Hypothyroidism, unspecified: Secondary | ICD-10-CM

## 2012-01-07 MED ORDER — PANTOPRAZOLE SODIUM 40 MG PO TBEC: 40.0000 mg | DELAYED_RELEASE_TABLET | Freq: Every day | ORAL | Status: DC

## 2012-01-07 MED ORDER — LEVOTHYROXINE SODIUM 112 MCG PO TABS: 112.0000 ug | ORAL_TABLET | Freq: Every day | ORAL | Status: DC

## 2012-01-07 MED ORDER — FERROUS SULFATE 325 (65 FE) MG PO TABS: 325.0000 mg | ORAL_TABLET | Freq: Two times a day (BID) | ORAL | Status: AC

## 2012-01-07 NOTE — Assessment & Plan Note (Signed)
Patient is noncompliant, therefore exacerbated frequently. Very dry skin today. Reinforced daily moisturizer and patient agrees to use cocoa butter or Vaseline daily on her extremities and affected areas. Has available hydroxyzine as needed for itch also. No signs of infection today. Followup in one to 2 months.

## 2012-01-07 NOTE — Assessment & Plan Note (Signed)
At goal with increased dose metoprolol. Will continue this therapy and followup in one to 2 months.

## 2012-01-07 NOTE — Assessment & Plan Note (Signed)
Stable, likely secondary to menses. Has been taking iron only once daily as needed. Reinforced taking this twice a day. Will recheck hemoglobin in one to 2 months.

## 2012-01-07 NOTE — Assessment & Plan Note (Addendum)
History is difficult. No red flags today. Give trial of PPI daily. Advised to try TUMS at onset of symptoms as this may be a gastritis or also possibly biliary complaint, though recent LFTs wnl. Follow up in one month or if symptoms worsen sooner.

## 2012-01-07 NOTE — Patient Instructions (Addendum)
Nice to see you again. You must use cocoa butter or vaseline EVERY DAY to protect your skin. Use steroid cream only when symptoms are bad. You can use hydroxyzine for itching. Increase your dose of levothyroxine to 112 and stop the 100 mcg tabs. Start taking protonix daily for stomach problem. Increase iron to twice daily. If you get constipated, you can take colace. Make appointment in 1-2 months for check up.

## 2012-01-07 NOTE — Progress Notes (Signed)
  Subjective:    Patient ID: Vanessa Alvarez, female    DOB: 1986-02-08, 26 y.o.   MRN: [id number]  HPI  1. Eczema. Itching is persistent. The previous area that was irritated/infected has cleared on her right hip. Currently she has been scratching an area on the right shin to the point of skin breakdown. Has not been compliant with any moisturizer or steroid cream. Not using Atarax for itch. Denies any bleeding, fever, chills, swelling, redness.  2. HTN. Has been taking increased dose metoprolol. Denies any side effects. Denies any leg swelling, fatigue, weight change.  3. Epigastric discomfort. History difficult as patient is unable to describe clearly. Points to her epigastrium and says there is a funny feeling in here sometimes. Denies any burning. States she has a bowel movement nearly daily, sometimes loose sometimes is hard. Thinks the pain is mainly after mealtime. This is not an active issue currently. Previously prescribed protonix and is not taking currently.  4. Hypothyroid. Mother confirms that she is compliant with his medication daily. Last TSH is mildly elevated. Denies any weight changes, changes in stool. She does have very dry skin.  Review of Systems See HPI otherwise negative.  reports that she has never smoked. She does not have any smokeless tobacco history on file.     Objective:   Physical Exam  Vitals reviewed. Constitutional: She is oriented to person, place, and time. She appears well-developed and well-nourished. No distress.  HENT:  Head: Normocephalic and atraumatic.  Eyes:       Left eye exotropia. EOMI. PERRLA.   Neck: Normal range of motion. Neck supple.  Cardiovascular: Normal rate, regular rhythm and normal heart sounds.   No murmur heard. Pulmonary/Chest: Effort normal.  Abdominal: Soft. Bowel sounds are normal. She exhibits no distension and no mass. There is no tenderness. There is no rebound and no guarding.  Musculoskeletal: She exhibits no edema and  no tenderness.  Neurological: She is alert and oriented to person, place, and time.  Skin: She is not diaphoretic.       Very dry skin. Right hip eczematous patch is healing. She has a darkened, hypertrophic excoriated area in right anterior shin, no erythema or weeping.   Psychiatric: She has a normal mood and affect.          Assessment & Plan:

## 2012-01-07 NOTE — Assessment & Plan Note (Signed)
TSH is elevated on current therapy. Will increase levothyroxine to 112 mcg daily. Recheck TSH in 2 months.

## 2012-01-18 ENCOUNTER — Other Ambulatory Visit: Payer: Self-pay | Admitting: Family Medicine

## 2012-01-18 MED ORDER — METOPROLOL TARTRATE 100 MG PO TABS: 100.0000 mg | ORAL_TABLET | Freq: Two times a day (BID) | ORAL | Status: DC

## 2012-03-21 ENCOUNTER — Ambulatory Visit (INDEPENDENT_AMBULATORY_CARE_PROVIDER_SITE_OTHER): Payer: Medicare Other | Admitting: Family Medicine

## 2012-03-21 ENCOUNTER — Encounter: Payer: Self-pay | Admitting: Family Medicine

## 2012-03-21 VITALS — BP 145/88 | HR 66 | Temp 98.1°F | Ht 64.5 in | Wt 215.0 lb

## 2012-03-21 DIAGNOSIS — L2089 Other atopic dermatitis: Secondary | ICD-10-CM

## 2012-03-21 DIAGNOSIS — D509 Iron deficiency anemia, unspecified: Secondary | ICD-10-CM

## 2012-03-21 DIAGNOSIS — I10 Essential (primary) hypertension: Secondary | ICD-10-CM

## 2012-03-21 DIAGNOSIS — E039 Hypothyroidism, unspecified: Secondary | ICD-10-CM

## 2012-03-21 LAB — CBC WITH DIFFERENTIAL/PLATELET
Hemoglobin: 11.7 g/dL — ABNORMAL LOW (ref 12.0–15.0)
Lymphocytes Relative: 43 % (ref 12–46)
Lymphs Abs: 2.3 10*3/uL (ref 0.7–4.0)
MCH: 25.6 pg — ABNORMAL LOW (ref 26.0–34.0)
Monocytes Relative: 8 % (ref 3–12)
Neutrophils Relative %: 47 % (ref 43–77)
Platelets: 262 10*3/uL (ref 150–400)
RBC: 4.57 MIL/uL (ref 3.87–5.11)
WBC: 5.4 10*3/uL (ref 4.0–10.5)

## 2012-03-21 NOTE — Progress Notes (Signed)
  Subjective:    Patient ID: Vanessa Alvarez, female    DOB: 1985-08-28, 26 y.o.   MRN: [id number]  HPI  1. HTN. Checking with wrist cuff almost daily. Random values range 120/70-160/100. Patient does not complain of headache, edema, chest pain. She is compliant with BID medications. She has lost 2 pounds since her last evaluation. She endorses snoring, groggy feeling in am, excessive daytime sleepiness.   2. hypothyroidism. We increased her dose to 112 mcg daily 2 months ago. She denies any changes in her bowel pattern, palpitations. She does endorse daytime fatigue and 2 pound weight loss. Endorses compliance of medication. Endorses very dry skin.  3. eczema. Main problem is that she continues to scratch certain areas including her back to the point of excoriation and irritation. She does have dry skin and states uses petroleum jelly daily. She did not use this today. Uses hydrocortisone and hydroxyzine very infrequently.  Review of Systems See HPI otherwise negative.  reports that she has never smoked. She does not have any smokeless tobacco history on file.     Objective:   Physical Exam  Vitals reviewed. Constitutional: She appears well-developed and well-nourished. No distress.  HENT:  Head: Normocephalic and atraumatic.  Eyes: EOM are normal.  Neck: Neck supple. No thyromegaly present.  Cardiovascular: Normal rate, regular rhythm and normal heart sounds.   No murmur heard. Pulmonary/Chest: Effort normal and breath sounds normal. No respiratory distress.  Abdominal: Soft. Bowel sounds are normal. She exhibits no distension. There is no tenderness. There is no rebound and no guarding.  Musculoskeletal: She exhibits no edema and no tenderness.  Neurological: She is alert.  Skin: She is not diaphoretic.       Patient has very dry skin on legs and back.  Her back has several superficial excoriations with central scabbing. Some hyperpigmented areas. There is no erythema, oozing, abscess,  tenderness. Legs have rare/small scattered areas of hyperkeratotic hyperpigment, well circumscribed.  Psychiatric: She has a normal mood and affect.        Assessment & Plan:

## 2012-03-21 NOTE — Assessment & Plan Note (Signed)
Barriers to improvement include chronic itching. Discussed the need to control her compulsions with the patient. Again reinforced daily use of Vaseline, hydroxyzine, and hydrocortisone as needed. There does not appear to be any overlying infection. Followup in one to 2 months.

## 2012-03-21 NOTE — Patient Instructions (Addendum)
Nice to see you. Your blood pressure is too high, I suspect you may have sleep apnea. You would probably benefit from a sleep study and treatment CPAP would give you more energy and improve your blood pressure. The other treatment would be weight loss and diet/exercise. Keep up good work with weight loss. Goal BP is less than 140/90. Seems like your home cuff is about 15 pts higher than the most accurate one here. Make sure to use vaseline/petroleum jelly every day on your legs/back after your bath. Most important is TRY NOT TO SCRATCH. Make appointment in 1-2 months for follow up.  DASH Diet The DASH diet stands for "Dietary Approaches to Stop Hypertension." It is a healthy eating plan that has been shown to reduce high blood pressure (hypertension) in as little as 14 days, while also possibly providing other significant health benefits. These other health benefits include reducing the risk of breast cancer after menopause and reducing the risk of type 2 diabetes, heart disease, colon cancer, and stroke. Health benefits also include weight loss and slowing kidney failure in patients with chronic kidney disease.  DIET GUIDELINES  Limit salt (sodium). Your diet should contain less than 1500 mg of sodium daily.  Limit refined or processed carbohydrates. Your diet should include mostly whole grains. Desserts and added sugars should be used sparingly.  Include small amounts of heart-healthy fats. These types of fats include nuts, oils, and tub margarine. Limit saturated and trans fats. These fats have been shown to be harmful in the body. CHOOSING FOODS  The following food groups are based on a 2000 calorie diet. See your Registered Dietitian for individual calorie needs. Grains and Grain Products (6 to 8 servings daily)  Eat More Often: Whole-wheat bread, brown rice, whole-grain or wheat pasta, quinoa, popcorn without added fat or salt (air popped).  Eat Less Often: White bread, white pasta,  white rice, cornbread. Vegetables (4 to 5 servings daily)  Eat More Often: Fresh, frozen, and canned vegetables. Vegetables may be raw, steamed, roasted, or grilled with a minimal amount of fat.  Eat Less Often/Avoid: Creamed or fried vegetables. Vegetables in a cheese sauce. Fruit (4 to 5 servings daily)  Eat More Often: All fresh, canned (in natural juice), or frozen fruits. Dried fruits without added sugar. One hundred percent fruit juice ( cup [237 mL] daily).  Eat Less Often: Dried fruits with added sugar. Canned fruit in light or heavy syrup. [employer], Fish, and Poultry (2 servings or less daily. One serving is 3 to 4 oz [85-114 g]).  Eat More Often: Ninety percent or leaner ground beef, tenderloin, sirloin. Round cuts of beef, chicken breast, [place] breast. All fish. Grill, bake, or broil your meat. Nothing should be fried.  Eat Less Often/Avoid: Fatty cuts of meat, [place], or chicken leg, thigh, or wing. Fried cuts of meat or fish. Dairy (2 to 3 servings)  Eat More Often: Low-fat or fat-free milk, low-fat plain or light yogurt, reduced-fat or part-skim cheese.  Eat Less Often/Avoid: Milk (whole, 2%).Whole milk yogurt. Full-fat cheeses. Nuts, Seeds, and Legumes (4 to 5 servings per week)  Eat More Often: All without added salt.  Eat Less Often/Avoid: Salted nuts and seeds, canned beans with added salt. Fats and Sweets (limited)  Eat More Often: Vegetable oils, tub margarines without trans fats, sugar-free gelatin. Mayonnaise and salad dressings.  Eat Less Often/Avoid: Coconut oils, palm oils, butter, stick margarine, cream, half and half, cookies, candy, pie. FOR MORE INFORMATION The Dash Diet  Eating Plan: www.dashdiet.org Document Released: 04/01/2011 Document Revised: 07/05/2011 Document Reviewed: 04/01/2011 [facility] Patient Information 2013 [place].

## 2012-03-21 NOTE — Assessment & Plan Note (Signed)
Repeat CBC today on iron. Most likely related to menstrual bleeding. Consider check FOB stool at next visit if this persists on iron since she formerly had complaints of epigastric discomfort-this is resolved today.

## 2012-03-21 NOTE — Assessment & Plan Note (Signed)
Will recheck TSH with higher dose synthroid. F/u in 2-3 months.

## 2012-03-21 NOTE — Assessment & Plan Note (Signed)
Borderline goal. Her home wrist cuff measurement is 15-20 mm Hg higher than the manual here at clinic on today's measurement. Gave reassurance that she is likely at goal of less than 140/90. However, she seems to complain of probabe sleep apnea that could be underlying cause of HTN in this [facility] patient. We discussed what entails evaluation and treatment of sleep apnea, and patient was not interested in proceeding with this evaluation, her mother also deferred this decision to the patient. Discussed risk of long-term untreated hypertension. Advised followup in one to 2 months for recheck. Discussed weight loss with goal of 20 pounds and provided instruction of DASH diet. Consider adding low dose HCTZ if control worsens.

## 2012-03-22 ENCOUNTER — Encounter: Payer: Self-pay | Admitting: Family Medicine

## 2012-04-21 ENCOUNTER — Ambulatory Visit (INDEPENDENT_AMBULATORY_CARE_PROVIDER_SITE_OTHER): Payer: Medicare Other | Admitting: Family Medicine

## 2012-04-21 VITALS — BP 146/95 | HR 76 | Temp 98.8°F | Ht 64.5 in | Wt 209.0 lb

## 2012-04-21 DIAGNOSIS — R300 Dysuria: Secondary | ICD-10-CM

## 2012-04-21 LAB — POCT UA - MICROSCOPIC ONLY

## 2012-04-21 LAB — POCT URINALYSIS DIPSTICK
Bilirubin, UA: NEGATIVE
Glucose, UA: NEGATIVE
Ketones, UA: NEGATIVE
Leukocytes, UA: NEGATIVE
Nitrite, UA: NEGATIVE

## 2012-04-21 MED ORDER — FLUCONAZOLE 150 MG PO TABS: 150.0000 mg | ORAL_TABLET | Freq: Once | ORAL | Status: DC

## 2012-04-21 NOTE — Progress Notes (Signed)
  Subjective:    Patient ID: Vanessa Alvarez, female    DOB: [birth year], 26 y.o.   MRN: [id number]  HPI  1. Dysuria. Complains of burning with urination, frequency and feeling of incomplete emptying sensation, suprapubic discomfort. Time line is uncertain, seems like this has been present for months but worsened past few days. Mother states this is probably more chronic (months). There was a UA obtained about one year ago due to dysuria on ROS that was negative. No urine cultures or recent UTIs.  She endorses right lower back pain that is chronic. Denies fever, chills, nausea, emesis, abd pain, vaginal discharge, skipped periods, weight loss.  Denies history of UTI.  Review of Systems See HPI otherwise negative.  reports that she has never smoked. She does not have any smokeless tobacco history on file.     Objective:   Physical Exam  Vitals reviewed. Constitutional: She is oriented to person, place, and time. She appears well-developed and well-nourished. No distress.       obese  HENT:  Mouth/Throat: Oropharynx is clear and moist.  Pulmonary/Chest: Effort normal.  Abdominal: Soft. Bowel sounds are normal. She exhibits no distension. There is no tenderness. There is no rebound and no guarding.       No CVA tenderness  Musculoskeletal: She exhibits no edema and no tenderness.  Neurological: She is alert and oriented to person, place, and time.  Skin: No rash noted. She is not diaphoretic.  Psychiatric: She has a normal mood and affect.       Assessment & Plan:

## 2012-04-21 NOTE — Patient Instructions (Signed)
I will send your urine for culture, may take few days. Try the diflucan pill in case irritation is from yeast infection. If the urine doesn't show anything, may need evaluation of bladder emptying. Make appointment for follow up in 1 week if still having symptoms.  Dysuria You have dysuria. This is pain on urination. Dysuria is often present with other symptoms such as:  A sudden urge to go.  Having to go more often. Dysuria can be caused by:  Urinary tract infections.  Yeast infections.  Prostate problems.  Urinary stones.  Sexually transmitted diseases. Lab tests of the urine will usually be needed to confirm a urinary infection. An infection is the cause of dysuria in over half the cases. In older men the prostate gland enlarges and can cause urinary problems. These include:   Urinary obstruction.  Infection.  Pain on urination. Bladder cancer can also cause blood in the urine and dysuria. If you have an infection, be sure to take the antibiotics prescribed for you until they are gone. This will help prevent a recurrence. Further checking by a specialist may be needed if the cause of your dysuria is not found. Cystoscopy, x-rays, pelvic exams, and special cultures may be needed to find the cause and help find the best treatment. See your caregiver right away if your symptoms are not improved after three days.  SEEK IMMEDIATE MEDICAL CARE IF:  You have difficulty urinating, pass bloody urine, or have chills or a fever. Document Released: 04/12/2005 Document Revised: 07/05/2011 Document Reviewed: 09/27/2006 [facility] Patient Information 2013 [place].

## 2012-04-21 NOTE — Assessment & Plan Note (Signed)
Urine is concentrated with protein, no definite infection noted. Had normal A1c testing this year, not likely an endocrine manifestation. Will send urine culture. Give trial of diflucan in case this may be a yeast urethritis since there is low risk of harm and maybe beneficial. If urine culture fails to show source and symptoms remain, will consider referral to urology for urodynamics since patient seems to be having more chronic dysuria and sensation of incomplete emptying (though time line is difficult in setting of MR). Advised to f/u in one week if still symptomatic. Patient and mother agree.

## 2012-04-23 LAB — URINE CULTURE: Colony Count: 2000

## 2012-04-24 ENCOUNTER — Encounter: Payer: Self-pay | Admitting: Family Medicine

## 2012-05-08 ENCOUNTER — Ambulatory Visit (INDEPENDENT_AMBULATORY_CARE_PROVIDER_SITE_OTHER): Payer: Medicare Other | Admitting: Family Medicine

## 2012-05-08 ENCOUNTER — Encounter: Payer: Self-pay | Admitting: Family Medicine

## 2012-05-08 VITALS — BP 141/82 | HR 75 | Temp 98.5°F | Wt 211.4 lb

## 2012-05-08 DIAGNOSIS — L2089 Other atopic dermatitis: Secondary | ICD-10-CM

## 2012-05-08 DIAGNOSIS — R300 Dysuria: Secondary | ICD-10-CM

## 2012-05-08 DIAGNOSIS — J029 Acute pharyngitis, unspecified: Secondary | ICD-10-CM

## 2012-05-08 DIAGNOSIS — J329 Chronic sinusitis, unspecified: Secondary | ICD-10-CM

## 2012-05-08 LAB — POCT RAPID STREP A (OFFICE): Rapid Strep A Screen: NEGATIVE

## 2012-05-08 MED ORDER — DOXYCYCLINE HYCLATE 100 MG PO TABS: 100.0000 mg | ORAL_TABLET | Freq: Two times a day (BID) | ORAL | Status: DC

## 2012-05-08 NOTE — Assessment & Plan Note (Signed)
Her symptoms seem c/w possible bacterial sinusitis. Discussed period of observation vs treatment with antibiotics. Chose doxycycline in setting of her skin rash and scratching as cold be super-infection. Advised fluids and f/u if not improving.

## 2012-05-08 NOTE — Patient Instructions (Addendum)
Nice to see you. You may have a sinus infection causing sore throat and nausea. Take antibiotics (doxycycline) if not better in next few days. Increase hydroxyzine up to 2-3 times per day for itching. Use hydrocortisone on your rash. Use vaseline to protect skin. Use bacitracin (topical antibiotic) if skin is open to prevent infection.  Sinusitis Sinusitis is redness, soreness, and swelling (inflammation) of the paranasal sinuses. Paranasal sinuses are air pockets within the bones of your face (beneath the eyes, the middle of the forehead, or above the eyes). In healthy paranasal sinuses, mucus is able to drain out, and air is able to circulate through them by way of your nose. However, when your paranasal sinuses are inflamed, mucus and air can become trapped. This can allow bacteria and other germs to grow and cause infection. Sinusitis can develop quickly and last only a short time (acute) or continue over a long period (chronic). Sinusitis that lasts for more than 12 weeks is considered chronic.  CAUSES  Causes of sinusitis include:  Allergies.  Structural abnormalities, such as displacement of the cartilage that separates your nostrils (deviated septum), which can decrease the air flow through your nose and sinuses and affect sinus drainage.  Functional abnormalities, such as when the small hairs (cilia) that line your sinuses and help remove mucus do not work properly or are not present. SYMPTOMS  Symptoms of acute and chronic sinusitis are the same. The primary symptoms are pain and pressure around the affected sinuses. Other symptoms include:  Upper toothache.  Earache.  Headache.  Bad breath.  Decreased sense of smell and taste.  A cough, which worsens when you are lying flat.  Fatigue.  Fever.  Thick drainage from your nose, which often is green and may contain pus (purulent).  Swelling and warmth over the affected sinuses. DIAGNOSIS  Your caregiver will perform a  physical exam. During the exam, your caregiver may:  Look in your nose for signs of abnormal growths in your nostrils (nasal polyps).  Tap over the affected sinus to check for signs of infection.  View the inside of your sinuses (endoscopy) with a special imaging device with a light attached (endoscope), which is inserted into your sinuses. If your caregiver suspects that you have chronic sinusitis, one or more of the following tests may be recommended:  Allergy tests.  Nasal culture A sample of mucus is taken from your nose and sent to a lab and screened for bacteria.  Nasal cytology A sample of mucus is taken from your nose and examined by your caregiver to determine if your sinusitis is related to an allergy. TREATMENT  Most cases of acute sinusitis are related to a viral infection and will resolve on their own within 10 days. Sometimes medicines are prescribed to help relieve symptoms (pain medicine, decongestants, nasal steroid sprays, or saline sprays).  However, for sinusitis related to a bacterial infection, your caregiver will prescribe antibiotic medicines. These are medicines that will help kill the bacteria causing the infection.  Rarely, sinusitis is caused by a fungal infection. In theses cases, your caregiver will prescribe antifungal medicine. For some cases of chronic sinusitis, surgery is needed. Generally, these are cases in which sinusitis recurs more than 3 times per year, despite other treatments. HOME CARE INSTRUCTIONS   Drink plenty of water. Water helps thin the mucus so your sinuses can drain more easily.  Use a humidifier.  Inhale steam 3 to 4 times a day (for example, sit in the bathroom  with the shower running).  Apply a warm, moist washcloth to your face 3 to 4 times a day, or as directed by your caregiver.  Use saline nasal sprays to help moisten and clean your sinuses.  Take over-the-counter or prescription medicines for pain, discomfort, or fever only  as directed by your caregiver. SEEK IMMEDIATE MEDICAL CARE IF:  You have increasing pain or severe headaches.  You have nausea, vomiting, or drowsiness.  You have swelling around your face.  You have vision problems.  You have a stiff neck.  You have difficulty breathing. MAKE SURE YOU:   Understand these instructions.  Will watch your condition.  Will get help right away if you are not doing well or get worse. Document Released: 04/12/2005 Document Revised: 07/05/2011 Document Reviewed: 04/27/2011 [facility] Patient Information 2013 [place].

## 2012-05-08 NOTE — Progress Notes (Signed)
  Subjective:    Patient ID: Vanessa Alvarez, female    DOB: April 26, 1986, 27 y.o.   MRN: [id number]  HPI  1. Sore throat and nausea. Present for 2 weeks. With a cough, usually nonproductive. She had an isolated fever at onset, none recently.  No recent emesis or abdominal pain, dysuria, urinary frequency, headache.  2. Eczema. Worsened recently, patient scratching uncontrollably on her chest area. There is small open area and break in skin. Hurts infrequently. No oozing or bleeding. Takes hydroxyzine less than once daily (mom states rarely).   Review of Systems See HPI otherwise negative.  reports that she has never smoked. She does not have any smokeless tobacco history on file.     Objective:   Physical Exam  Vitals reviewed. Constitutional: She appears well-developed and well-nourished. No distress.  HENT:  Head: Normocephalic.  Right Ear: External ear normal.  Left Ear: External ear normal.  Mouth/Throat: No oropharyngeal exudate.       +Slight pharyngeal erythema. +Nose with crusting. +Malodorous breath. No sinus TTP.  Eyes: EOM are normal. Pupils are equal, round, and reactive to light.  Neck: Neck supple.  Cardiovascular: Normal rate, regular rhythm and normal heart sounds.   No murmur heard. Pulmonary/Chest: Effort normal and breath sounds normal. No respiratory distress. She has no wheezes. She has no rales.  Abdominal: Soft. Bowel sounds are normal.  Musculoskeletal: She exhibits no edema.  Lymphadenopathy:    She has no cervical adenopathy.  Neurological: She is alert.  Skin: She is not diaphoretic.       Sternal area has ~2cm area denuded skin with excoriations. Slight dryness. Tiny ulcerated portion.  Perioral dryness and slight hyperpigmentation.  Psychiatric: She has a normal mood and affect.          Assessment & Plan:

## 2012-05-08 NOTE — Assessment & Plan Note (Signed)
Resolved currently. Negative urine ctx last month.

## 2012-05-08 NOTE — Assessment & Plan Note (Signed)
Uncontrolled pruritis. Seems to have a behavioral component. Patient plans to sit on her hands next time and I advised she take hydroxyzine more often. Discussed keeping covered with bandage. Use of bacitracin if skin is opened, vs hydrocortisone and moisturizer for chronic use. F/u prn.

## 2012-06-12 ENCOUNTER — Other Ambulatory Visit: Payer: Self-pay | Admitting: Family Medicine

## 2012-07-16 ENCOUNTER — Other Ambulatory Visit: Payer: Self-pay | Admitting: Family Medicine

## 2012-10-20 ENCOUNTER — Ambulatory Visit (INDEPENDENT_AMBULATORY_CARE_PROVIDER_SITE_OTHER): Payer: Medicare Other | Admitting: Family Medicine

## 2012-10-20 ENCOUNTER — Encounter: Payer: Self-pay | Admitting: Family Medicine

## 2012-10-20 VITALS — BP 135/88 | HR 81 | Temp 99.7°F | Ht 64.5 in | Wt 216.0 lb

## 2012-10-20 DIAGNOSIS — E039 Hypothyroidism, unspecified: Secondary | ICD-10-CM

## 2012-10-20 DIAGNOSIS — R350 Frequency of micturition: Secondary | ICD-10-CM

## 2012-10-20 DIAGNOSIS — I10 Essential (primary) hypertension: Secondary | ICD-10-CM

## 2012-10-20 LAB — TSH: TSH: 6.093 u[IU]/mL — ABNORMAL HIGH (ref 0.350–4.500)

## 2012-10-20 LAB — COMPREHENSIVE METABOLIC PANEL
Albumin: 3.7 g/dL (ref 3.5–5.2)
Alkaline Phosphatase: 56 U/L (ref 39–117)
BUN: 18 mg/dL (ref 6–23)
CO2: 23 mEq/L (ref 19–32)
Calcium: 9.2 mg/dL (ref 8.4–10.5)
Chloride: 103 mEq/L (ref 96–112)
Glucose, Bld: 81 mg/dL (ref 70–99)
Potassium: 4.3 mEq/L (ref 3.5–5.3)
Sodium: 135 mEq/L (ref 135–145)
Total Protein: 7.4 g/dL (ref 6.0–8.3)

## 2012-10-20 LAB — POCT URINALYSIS DIPSTICK
Blood, UA: NEGATIVE
Glucose, UA: NEGATIVE
Nitrite, UA: NEGATIVE
Protein, UA: >=300
Spec Grav, UA: >=1.03
Urobilinogen, UA: 0.2
pH, UA: 6.5

## 2012-10-20 LAB — POCT UA - MICROSCOPIC ONLY

## 2012-10-20 NOTE — Patient Instructions (Addendum)
You may have improper stretching or emptying of bladder. A referral to urology for testing would help determine cause. I will culture urine again. If labs normal, will send a letter. Your blood pressure is good today.

## 2012-10-20 NOTE — Addendum Note (Signed)
Addended by: [place], Timesha Cervantez on: 10/20/2012 11:09 AM   Modules accepted: Orders

## 2012-10-20 NOTE — Assessment & Plan Note (Signed)
Mildly elevated TSH last visit, will repeat.

## 2012-10-20 NOTE — Progress Notes (Signed)
  Subjective:    Patient ID: Vanessa Alvarez, female    DOB: 1985/07/24, 26 y.o.   MRN: [id number]  HPI  43. 27 yo female with MR presents with recurrent urinary frequency with minimal output. She also complains of hesitancy. Was seen for similar in Dec without evidence of infection. Mother states patient has continued to complain of similar for long term, this is not really an acute problem. As a child mother reports she would hold her urine for hours at school without voiding. Patient does have some small amount of incontinence during daytime and has complete loss of bladder while sleeping occasionally.   No dysuria, fever, chills, back pain, nausea, emesis, edema, dyspnea, blood in urine.  2. HTN. Patients desires BP check today. No headaches, syncope, chest pain.  Review of Systems See HPI otherwise negative.  reports that she has never smoked. She does not have any smokeless tobacco history on file.     Objective:   Physical Exam  Vitals reviewed. Constitutional: She is oriented to person, place, and time. She appears well-developed and well-nourished. No distress.  Obese female  HENT:  Head: Normocephalic and atraumatic.  Mouth/Throat: Oropharynx is clear and moist.  Cardiovascular: Normal rate, regular rhythm and normal heart sounds.   Pulmonary/Chest: Effort normal and breath sounds normal.  Abdominal: Soft. Bowel sounds are normal. There is no tenderness. There is no rebound and no guarding.  Mild distension obese, soft.  Genitourinary:  Patient refuses pelvic exam and urinary catheter for post void residual.  Breast exam done per mother request. No masses or skin changes noted.  Musculoskeletal: She exhibits no edema and no tenderness.  Neurological: She is alert and oriented to person, place, and time.  Skin: She is not diaphoretic.        Assessment & Plan:

## 2012-10-20 NOTE — Assessment & Plan Note (Addendum)
Frequency with incontinence and nocturnal incontinence, no initial sign of infection on UA. This seems like a longstanding problem and suspicious for sphincter dysfunction/obstruction vs overflow incontinence. She is not taking hydroxyzine or any new medications/diuretics. Unable to palpate bladder, patient refuses post void residual catheter/pelvic exam and they refuse urology referral for urodynamics despite my recommendation. Will send culture and patient will consider pursuing further investigation. Check cr.

## 2012-10-20 NOTE — Assessment & Plan Note (Signed)
At goal today. No medication changes.

## 2012-12-30 ENCOUNTER — Other Ambulatory Visit: Payer: Self-pay | Admitting: Family Medicine

## 2013-01-03 ENCOUNTER — Other Ambulatory Visit: Payer: Self-pay | Admitting: Family Medicine

## 2013-01-03 NOTE — Telephone Encounter (Signed)
Needs office appointment for further refills.

## 2013-01-09 ENCOUNTER — Other Ambulatory Visit: Payer: Self-pay | Admitting: Family Medicine

## 2013-01-10 NOTE — Telephone Encounter (Signed)
PT NEEDS Appointment for further refills

## 2013-02-13 ENCOUNTER — Ambulatory Visit: Payer: Medicare Other | Admitting: Family Medicine

## 2013-02-13 ENCOUNTER — Other Ambulatory Visit: Payer: Self-pay | Admitting: Sports Medicine

## 2013-02-15 ENCOUNTER — Other Ambulatory Visit: Payer: Self-pay | Admitting: Sports Medicine

## 2013-02-26 ENCOUNTER — Encounter: Payer: Self-pay | Admitting: Family Medicine

## 2013-02-26 ENCOUNTER — Ambulatory Visit (INDEPENDENT_AMBULATORY_CARE_PROVIDER_SITE_OTHER): Payer: Medicare Other | Admitting: Family Medicine

## 2013-02-26 ENCOUNTER — Ambulatory Visit: Payer: Medicare Other

## 2013-02-26 VITALS — BP 142/81 | HR 73 | Temp 98.4°F | Ht 64.5 in | Wt 226.8 lb

## 2013-02-26 DIAGNOSIS — R238 Other skin changes: Secondary | ICD-10-CM

## 2013-02-26 DIAGNOSIS — L298 Other pruritus: Secondary | ICD-10-CM | POA: Insufficient documentation

## 2013-02-26 DIAGNOSIS — L989 Disorder of the skin and subcutaneous tissue, unspecified: Secondary | ICD-10-CM

## 2013-02-26 DIAGNOSIS — M549 Dorsalgia, unspecified: Secondary | ICD-10-CM | POA: Insufficient documentation

## 2013-02-26 DIAGNOSIS — L988 Other specified disorders of the skin and subcutaneous tissue: Secondary | ICD-10-CM

## 2013-02-26 LAB — CBC
MCV: 72.5 fL — ABNORMAL LOW (ref 78.0–100.0)
Platelets: 291 10*3/uL (ref 150–400)
RBC: 4.44 MIL/uL (ref 3.87–5.11)
WBC: 6.1 10*3/uL (ref 4.0–10.5)

## 2013-02-26 LAB — COMPREHENSIVE METABOLIC PANEL
ALT: 11 U/L (ref 0–35)
Albumin: 3.9 g/dL (ref 3.5–5.2)
CO2: 26 mEq/L (ref 19–32)
Calcium: 9.5 mg/dL (ref 8.4–10.5)
Chloride: 103 mEq/L (ref 96–112)
Potassium: 4.1 mEq/L (ref 3.5–5.3)
Sodium: 138 mEq/L (ref 135–145)
Total Protein: 7.7 g/dL (ref 6.0–8.3)

## 2013-02-26 MED ORDER — TERBINAFINE HCL 1 % EX CREA: TOPICAL_CREAM | Freq: Two times a day (BID) | CUTANEOUS | Status: DC

## 2013-02-26 MED ORDER — DIPHENHYDRAMINE HCL 25 MG PO TABS: 25.0000 mg | ORAL_TABLET | Freq: Four times a day (QID) | ORAL | Status: DC | PRN

## 2013-02-26 NOTE — Progress Notes (Signed)
Family Medicine Office Visit Note   Subjective:   Patient ID: Vanessa Alvarez, female  DOB: 31-May-1985, 27 y.o.. MRN: [id number]   Primary historian is the mother who brings Vanessa Alvarez today for complaints about new skin lesions on her hands/feet, and back pain.  Skin lesions: mother noticed them 2 weeks ago. She reports they have been itchy, but never painful. Denies other skin rashes. Fever, chills, nausea or vomiting. She has not started on any new medication, and has not hx of recent change in soaps or skin lotion. Denies topical contact with unknown substances or plants. No Hx of travel. No concerns for STD's. She has tried only topical moisturizers with no relieve. Has not taken anything for pruritus.  She has PMHx significant for HTN, HLP, Hypothyroidism, Mental Retardation. She is on several medications including Depakote, Zoloft, Synthroid and Metoprolol, but denies any recent change.   Back pain: Reports has had this for years. Worse when she walks. More noticeable recently that she is trying to lose weight and goes for a walk with her mother more frequently. Pain it is described in the center of her lower back without radiation. No saddle anesthesia, urinary or fecal incontinence. No weakness, numbness or tingling on her LE reported. Mother reports pt has Hx of Scoliosis and attributes pain to this condition.   Review of Systems:  Per HPI  Objective:   Physical Exam: Gen:  NAD HEENT: Moist mucous membranes  CV: Regular rate and rhythm, no murmurs. PULM: Clear to auscultation bilaterally. ABD: Soft, non tender, non distended, normal bowel sounds. No CVA tenderness.  EXT: No edema MSK:  Back - Spine with no vissible deformity.  No tenderness to vertebral process palpation.  Paraspinous muscles are not tender and without spasm.   Range of motion is full at neck and lumbar sacral regions. Leg raise negative bilaterally. 5/5 strength. Normal reflexes. Sensation intact.  SKIN: multiple vesicular  lesions present in soles and palms only. No mucosal involvement.  Lesion in feet are more prominent in the interdigital area and plantar aspect of the foot arch bilaterally. In hands there is no involvement of interdigital area.  Upper back: :signs of excoriations and dry skin patches resembling chronic eczema.   Assessment & Plan:

## 2013-02-26 NOTE — Patient Instructions (Addendum)
It has been a pleasure to see you today. Please take the medications as prescribed. I will call you with the labs results. Make your next appointment in 1-2 weeks or sooner if needed.

## 2013-02-26 NOTE — Assessment & Plan Note (Signed)
Pain is chronic in nature and only symptomatic after exertion. Physical exam is reassuring.  Will obtain XR and f/u as needed. Instructed on symptomatic treatment and discussed red flags that will warrant further evaluation.

## 2013-02-26 NOTE — Assessment & Plan Note (Addendum)
Only present in palms and soles, no mucosal involvement. Discussed with attending Dr. Erin Hearing. Concern for vesicular tinea vs drug reaction. Pt has not had any new drug added recently or changes in dose. No other skin manifestations. Has been present for 2 weeks, pruriginous nature and location favors fungal cause over drug reaction.  Will obtain CBC, CMET (pt is on depakote and LFT's are needed in order to determine oral antifungal therapy) Topical lamisil Discussed with pt and guardian (mother) signs of worsening condition that should prompt re-evaluation. F/u in 1-2 weeks or sooner if needed.

## 2013-03-05 ENCOUNTER — Ambulatory Visit
Admission: RE | Admit: 2013-03-05 | Discharge: 2013-03-05 | Disposition: A | Discharge status: Home / Self Care | Payer: Medicare Other | Source: Ambulatory Visit | Attending: Family Medicine | Admitting: Family Medicine

## 2013-03-05 DIAGNOSIS — M549 Dorsalgia, unspecified: Secondary | ICD-10-CM

## 2013-03-06 ENCOUNTER — Encounter: Payer: Self-pay | Admitting: Family Medicine

## 2013-03-12 ENCOUNTER — Ambulatory Visit (INDEPENDENT_AMBULATORY_CARE_PROVIDER_SITE_OTHER): Payer: Medicare Other | Admitting: Family Medicine

## 2013-03-12 ENCOUNTER — Encounter: Payer: Self-pay | Admitting: Family Medicine

## 2013-03-12 VITALS — BP 147/86 | HR 80 | Temp 99.0°F | Ht 64.5 in | Wt 228.0 lb

## 2013-03-12 DIAGNOSIS — R6889 Other general symptoms and signs: Secondary | ICD-10-CM

## 2013-03-12 DIAGNOSIS — D509 Iron deficiency anemia, unspecified: Secondary | ICD-10-CM

## 2013-03-12 DIAGNOSIS — B353 Tinea pedis: Secondary | ICD-10-CM

## 2013-03-12 DIAGNOSIS — R238 Other skin changes: Secondary | ICD-10-CM

## 2013-03-12 DIAGNOSIS — R899 Unspecified abnormal finding in specimens from other organs, systems and tissues: Secondary | ICD-10-CM

## 2013-03-12 DIAGNOSIS — L988 Other specified disorders of the skin and subcutaneous tissue: Secondary | ICD-10-CM

## 2013-03-12 DIAGNOSIS — M549 Dorsalgia, unspecified: Secondary | ICD-10-CM

## 2013-03-12 MED ORDER — TERBINAFINE HCL 250 MG PO TABS: 250.0000 mg | ORAL_TABLET | Freq: Every day | ORAL | Status: DC

## 2013-03-12 NOTE — Patient Instructions (Signed)
Athlete's Foot Athlete's foot (tinea pedis) is a fungal infection of the skin on the feet. It often occurs on the skin between the toes or underneath the toes. It can also occur on the soles of the feet. Athlete's foot is more likely to occur in hot, humid weather. Not washing your feet or changing your socks often enough can contribute to athlete's foot. The infection can spread from person to person (contagious). CAUSES Athlete's foot is caused by a fungus. This fungus thrives in warm, moist places. Most people get athlete's foot by sharing shower stalls, towels, and wet floors with an infected person. People with weakened immune systems, including those with diabetes, may be more likely to get athlete's foot. SYMPTOMS   Itchy areas between the toes or on the soles of the feet.  White, flaky, or scaly areas between the toes or on the soles of the feet.  Tiny, intensely itchy blisters between the toes or on the soles of the feet.  Tiny cuts on the skin. These cuts can develop a bacterial infection.  Thick or discolored toenails. DIAGNOSIS  Your caregiver can usually tell what the problem is by doing a physical exam. Your caregiver may also take a skin sample from the rash area. The skin sample may be examined under a microscope, or it may be tested to see if fungus will grow in the sample. A sample may also be taken from your toenail for testing. TREATMENT  Over-the-counter and prescription medicines can be used to kill the fungus. These medicines are available as powders or creams. Your caregiver can suggest medicines for you. Fungal infections respond slowly to treatment. You may need to continue using your medicine for several weeks. PREVENTION   Do not share towels.  Wear sandals in wet areas, such as shared locker rooms and shared showers.  Keep your feet dry. Wear shoes that allow air to circulate. Wear cotton or wool socks. HOME CARE INSTRUCTIONS   Take medicines as directed by  your caregiver. Do not use steroid creams on athlete's foot.  Keep your feet clean and cool. Wash your feet daily and dry them thoroughly, especially between your toes.  Change your socks every day. Wear cotton or wool socks. In hot climates, you may need to change your socks 2 to 3 times per day.  Wear sandals or canvas tennis shoes with good air circulation.  If you have blisters, soak your feet in Burow's solution or Epsom salts for 20 to 30 minutes, 2 times a day to dry out the blisters. Make sure you dry your feet thoroughly afterward. SEEK MEDICAL CARE IF:   You have a fever.  You have swelling, soreness, warmth, or redness in your foot.  You are not getting better after 7 days of treatment.  You are not completely cured after 30 days.  You have any problems caused by your medicines. MAKE SURE YOU:   Understand these instructions.  Will watch your condition.  Will get help right away if you are not doing well or get worse. Document Released: 04/09/2000 Document Revised: 07/05/2011 Document Reviewed: 01/29/2011 [employer] Patient Information 2014 [employer].  

## 2013-03-13 ENCOUNTER — Other Ambulatory Visit: Payer: Self-pay | Admitting: Sports Medicine

## 2013-03-13 DIAGNOSIS — R899 Unspecified abnormal finding in specimens from other organs, systems and tissues: Secondary | ICD-10-CM | POA: Insufficient documentation

## 2013-03-13 NOTE — Assessment & Plan Note (Signed)
Hb slightly lower than her baseline. Mother reports not giving her Iron  Supplementation recently. MCV is low as well. Recommended to re-start Iron supplementation therapy.

## 2013-03-13 NOTE — Assessment & Plan Note (Addendum)
Liver function is normal. Since her lesions are consistent with vesicular tines will start oral treatment with terbinafine. F/ u in 4 weeks and re-assess if needs more than 6 weeks total therapy consider repeating LFT's Education about proper hygiene was given.

## 2013-03-13 NOTE — Assessment & Plan Note (Signed)
Spine Xr reported Convex left scoliosis. Otherwise negative Symptomatic treatment. F/u as needed. Discussed nature of condition and signs that should prompt medical evaluation.

## 2013-03-13 NOTE — Assessment & Plan Note (Signed)
Labs were reviewed there is concern for pt's Cr. She has one Bmet done in June that was elevated in the setting of urinary symptoms without infection. Urology evaluation was recommended but this never took place. Her most recent Cr is better but still out of normal range. This has been informed to pt and mother. Recommended f/u with PCP

## 2013-03-13 NOTE — Progress Notes (Signed)
Family Medicine Office Visit Note   Subjective:   Patient ID: Vanessa Alvarez, female  DOB: December 03, 1985, 27 y.o.. MRN: [id number]   Pt that comes today for f/u Back: no major change, intermittent and mild in intensity. Xray were discussed since she received letter sent, questions were answered. No weakness, saddle anesthesia, no fecal incontinence.   Skin: hand lesions are improved but her foot lesions are worse with crusting in the interdigital area.   Review of Systems:  Per HPI  Objective:   Physical Exam: Gen:  NAD HEENT: Moist mucous membranes  CV: Regular rate and rhythm, no murmurs PULM: Clear to auscultation bilaterally.  EXT: No edema SKIN: multiple vesicular lesions present mostly in soles and interdigital areas of maceration.  Improvement on palms lesion with topical antifungal.   Assessment & Plan:

## 2013-03-18 ENCOUNTER — Other Ambulatory Visit: Payer: Self-pay | Admitting: Sports Medicine

## 2013-03-20 ENCOUNTER — Other Ambulatory Visit: Payer: Self-pay | Admitting: Sports Medicine

## 2013-03-20 MED ORDER — LEVOTHYROXINE SODIUM 112 MCG PO TABS: 112.0000 ug | ORAL_TABLET | Freq: Every day | ORAL | Status: DC

## 2013-04-02 ENCOUNTER — Ambulatory Visit: Payer: Medicare Other | Admitting: Sports Medicine

## 2013-04-06 ENCOUNTER — Other Ambulatory Visit: Payer: Self-pay | Admitting: Sports Medicine

## 2013-04-11 ENCOUNTER — Encounter: Payer: Self-pay | Admitting: Sports Medicine

## 2013-04-11 ENCOUNTER — Ambulatory Visit (INDEPENDENT_AMBULATORY_CARE_PROVIDER_SITE_OTHER): Payer: Medicare Other | Admitting: Sports Medicine

## 2013-04-11 VITALS — BP 140/81 | HR 77 | Temp 98.3°F | Ht 64.5 in | Wt 225.0 lb

## 2013-04-11 DIAGNOSIS — D509 Iron deficiency anemia, unspecified: Secondary | ICD-10-CM

## 2013-04-11 DIAGNOSIS — Z0000 Encounter for general adult medical examination without abnormal findings: Secondary | ICD-10-CM

## 2013-04-11 DIAGNOSIS — I10 Essential (primary) hypertension: Secondary | ICD-10-CM

## 2013-04-11 DIAGNOSIS — L298 Other pruritus: Secondary | ICD-10-CM

## 2013-04-11 DIAGNOSIS — E039 Hypothyroidism, unspecified: Secondary | ICD-10-CM

## 2013-04-11 DIAGNOSIS — M549 Dorsalgia, unspecified: Secondary | ICD-10-CM

## 2013-04-11 LAB — CBC
HCT: 34 % — ABNORMAL LOW (ref 36.0–46.0)
Hemoglobin: 11.2 g/dL — ABNORMAL LOW (ref 12.0–15.0)
MCH: 24.6 pg — ABNORMAL LOW (ref 26.0–34.0)
Platelets: 260 10*3/uL (ref 150–400)
RBC: 4.56 MIL/uL (ref 3.87–5.11)
WBC: 4.3 10*3/uL (ref 4.0–10.5)

## 2013-04-11 LAB — LIPID PANEL
Cholesterol: 239 mg/dL — ABNORMAL HIGH (ref 0–200)
Total CHOL/HDL Ratio: 4.3 Ratio
Triglycerides: 203 mg/dL — ABNORMAL HIGH (ref <150)
VLDL: 41 mg/dL — ABNORMAL HIGH (ref 0–40)

## 2013-04-11 LAB — COMPREHENSIVE METABOLIC PANEL
ALT: 15 U/L (ref 0–35)
AST: 21 U/L (ref 0–37)
Albumin: 3.9 g/dL (ref 3.5–5.2)
Alkaline Phosphatase: 64 U/L (ref 39–117)
BUN: 18 mg/dL (ref 6–23)
CO2: 25 mEq/L (ref 19–32)
Calcium: 9.4 mg/dL (ref 8.4–10.5)
Chloride: 102 mEq/L (ref 96–112)
Creat: 1.35 mg/dL — ABNORMAL HIGH (ref 0.50–1.10)
Glucose, Bld: 80 mg/dL (ref 70–99)
Potassium: 4.2 mEq/L (ref 3.5–5.3)
Sodium: 138 mEq/L (ref 135–145)
Total Protein: 7.7 g/dL (ref 6.0–8.3)

## 2013-04-11 MED ORDER — LISINOPRIL 10 MG PO TABS: 10.0000 mg | ORAL_TABLET | Freq: Every day | ORAL | Status: DC

## 2013-04-11 MED ORDER — CARRINGTON MOISTURE BARRIER EX CREA: TOPICAL_CREAM | CUTANEOUS | Status: DC | PRN

## 2013-04-11 MED ORDER — AQUAPHOR EX OINT: TOPICAL_OINTMENT | CUTANEOUS | Status: DC | PRN

## 2013-04-11 NOTE — Patient Instructions (Signed)
   Stop using DIAL; see below  Use socks over your ointment on your feet at night  Start new BP medication  Follow up in 2-3 weeks for a nurse visit and repeat labs   Referral has been place for PT they will call you to schedule an appointment   If you need anything prior to your next visit please call the clinic. Please Bring all medications or accurate medication list with you to each appointment; an accurate medication list is essential in providing you the best care possible.     For your skin I recommend using the following products to help decrease sensitivity that is contributing to your condition.  Many soaps and lotions are very irritating to your skin.  I recommend using only the following options.  SOAP:     Either original scent free DOVE (use brand only)    or Cetaphil (generic okay)   Lotions, Creams & Ointments:     Lotions: try to avoid in general if you are having irritation, they tend to dry out more   Creams: Eucerin Cream (generic okay)   Ointments: Vaseline Petroleum Jelly (generic okay)

## 2013-04-11 NOTE — Assessment & Plan Note (Signed)
Declines flu; counseling provided

## 2013-04-11 NOTE — Assessment & Plan Note (Addendum)
Back school PT

## 2013-04-11 NOTE — Assessment & Plan Note (Signed)
Significant skin findings. Stop dial and start using Dove; discussed moisturizing regimen per AVS.

## 2013-04-11 NOTE — Assessment & Plan Note (Signed)
CBC and ferritin today

## 2013-04-11 NOTE — Assessment & Plan Note (Signed)
TSH today 

## 2013-04-11 NOTE — Progress Notes (Signed)
  Vanessa Alvarez - 27 y.o. female MRN [id number]  Date of birth: [birth year]  CC, HPI, Interval History & ROS  Vanessa Alvarez is here today to followup on her chronic medical conditions including:  Chronic Skin Dryness and itching, back pain, HTN, Obesity, Anemia, Hypothyroid   She reports overall is doing fairly well.  She is here today with her mother.  Neither of which express concern with her overall health.  She continues to have excessive itching of her back, hands, feet.  She has not been using her regimen daily for her sloughing of her feet.  Pt denies chest pain, dyspnea at rest or exertion, PND, lower extremity edema.  Patient denies any facial asymmetry, unilateral weakness, or dysarthria.  She's been trying to walk but has continued mid low back pain.  Has noticed specific back exercises.  This been going on greater than 2 months.  X-rays reviewed.  Pt denies any radicular symptoms, change in bowel or bladder habits, muscle weakness or falls associated with back pain.  No fevers, chills, night sweats or weight loss.  Mood is stable, Abilify has been added to regimen by her primary psychiatrist  Pertinent History & Care Coordination  No Patient Care Coordination Note on file.  History  Smoking status  . Never Smoker   Smokeless tobacco  . Not on file   Health Maintenance Due  Topic  . Influenza Vaccine     Recent Labs  10/20/12 0940  TSH 6.093*     Otherwise past Medical, Surgical, Social, and Family History Reviewed per EMR Medications and Allergies reviewed and all updated if necessary. Objective Findings  VITALS: HR: 77 bpm  BP: 140/81 mmHg  TEMP: 98.3 F (36.8 C) (Oral)  RESP:    HT: 5' 4.5" (163.8 cm)  WT: 225 lb (102.059 kg)  BMI: 38.1   BP Readings from Last 3 Encounters:  04/11/13 140/81  03/12/13 147/86  02/26/13 142/81   Wt Readings from Last 3 Encounters:  04/11/13 225 lb (102.059 kg)  03/12/13 228 lb (103.42 kg)  02/26/13 226 lb 12.8 oz (102.876  kg)     PHYSICAL EXAM: GENERAL:  adult obese female. In no discomfort; no respiratory distress  PSYCH: alert and appropriate, good insight; slowed mentation   HNEENT:   CARDIO: RRR, S1/S2 heard, no murmur  LUNGS: CTA B, no wheezes, no crackles  ABDOMEN: +BS, soft, non-tender, no rigidity, no guarding, no masses/hepatosplenomegaly  EXTREM:  Warm, well perfused.  Moves all 4 extremities spontaneously; no lateralization. Distal pulses 2+/4.  no pretibial edema. Thoracic levoscoliosis noted; no midline tenderness, bilateral paraspinal muscular spasms   GU:   SKIN:  significant dryness and secondary excoriations with dry cracking and lichenification of the soles of bilateral feet and toes.       Assessment & Plan   Problems addressed today: General Plan & Pt Instructions:  1. HYPOTHYROIDISM   2. ANEMIA, IRON DEFICIENCY   3. HYPERTENSION   4. Health care maintenance   5. Xerotic eczema   6. Back pain       Stop using DIAL; see below  Use socks over your ointment on your feet at night  Start new BP medication  Follow up in 2-3 weeks for a nurse visit and repeat labs   Referral has been place for PT they will call you to schedule an appointment    For further discussion of A/P and for follow up issues see problem based charting.

## 2013-04-11 NOTE — Assessment & Plan Note (Signed)
Start ACE today > f/u potential sleep apnea at next visit

## 2013-04-12 LAB — FERRITIN: Ferritin: 60 ng/mL (ref 10–291)

## 2013-04-16 ENCOUNTER — Encounter: Payer: Self-pay | Admitting: Sports Medicine

## 2013-05-09 ENCOUNTER — Ambulatory Visit: Payer: Medicare Other | Admitting: Physical Therapy

## 2013-05-10 ENCOUNTER — Ambulatory Visit (INDEPENDENT_AMBULATORY_CARE_PROVIDER_SITE_OTHER): Payer: Medicare Other | Admitting: Sports Medicine

## 2013-05-10 VITALS — BP 128/77 | HR 71 | Temp 99.3°F | Ht 64.5 in | Wt 222.0 lb

## 2013-05-10 DIAGNOSIS — I10 Essential (primary) hypertension: Secondary | ICD-10-CM | POA: Diagnosis not present

## 2013-05-10 DIAGNOSIS — E039 Hypothyroidism, unspecified: Secondary | ICD-10-CM

## 2013-05-10 DIAGNOSIS — L298 Other pruritus: Secondary | ICD-10-CM

## 2013-05-10 DIAGNOSIS — L2989 Other pruritus: Secondary | ICD-10-CM

## 2013-05-10 DIAGNOSIS — R1013 Epigastric pain: Secondary | ICD-10-CM | POA: Diagnosis not present

## 2013-05-10 DIAGNOSIS — E663 Overweight: Secondary | ICD-10-CM

## 2013-05-10 LAB — BASIC METABOLIC PANEL
BUN: 17 mg/dL (ref 6–23)
CHLORIDE: 101 meq/L (ref 96–112)
CO2: 24 meq/L (ref 19–32)
Calcium: 9.1 mg/dL (ref 8.4–10.5)
Creat: 1.17 mg/dL — ABNORMAL HIGH (ref 0.50–1.10)
Glucose, Bld: 83 mg/dL (ref 70–99)
Potassium: 4.9 mEq/L (ref 3.5–5.3)
SODIUM: 135 meq/L (ref 135–145)

## 2013-05-10 NOTE — Patient Instructions (Signed)
   Keep taking BP meds  Use a moisturizer multiple times per day.    Followup with physical therapy  we are checking labs again today.  Go get an abdominal x-ray.    If you need anything prior to your next visit please call the clinic. Please Bring all medications or accurate medication list with you to each appointment; an accurate medication list is essential in providing you the best care possible.

## 2013-05-10 NOTE — Assessment & Plan Note (Signed)
Improved, continue moisturizing regimen

## 2013-05-10 NOTE — Progress Notes (Signed)
  Vanessa Alvarez - 27 y.o. female MRN [id number]  Date of birth: [birth year]  CC, HPI, Interval History & ROS  Vanessa Alvarez is here today to followup on her chronic medical conditions including:  BP, Dyshidrotic Eczema,  hypothyroidism  She is here today with her mother and they report she seems to overall be doing better.  Skin is reported to be doing better.  Using [employer], Aquaphor ointment (once per day).    Pt denies chest pain, dyspnea at rest or exertion, PND, lower extremity edema.  No orthostasis  Patient denies any facial asymmetry, unilateral weakness, or dysarthria.  Other acute problems include:   Chest Pain/Upper belly pain: Occurs at rest following eating.  No pain on exertion.  No associated dyspnea.  She is status post cholecystectomy. Occasional dyspnea at rest.  No orthopnea.  No diaphoresis.  BMs daily.  No blood.  No vomiting, + nausea.   Pertinent History & Care Coordination  No Patient Care Coordination Note on file.  History  Smoking status  . Never Smoker   Smokeless tobacco  . Not on file   Health Maintenance Due  Topic  . Influenza Vaccine     Recent Labs  10/20/12 0940 04/11/13 0934  TRIG  --  203*  CHOL  --  239*  HDL  --  56  LDLCALC  --  142*  TSH 6.093*  --      Otherwise past Medical, Surgical, Social, and Family History Reviewed per EMR Medications and Allergies reviewed and all updated if necessary. Objective Findings  VITALS: HR: 71 bpm  BP: 128/77 mmHg  TEMP: 99.3 F (37.4 C) (Oral)  RESP:    HT: 5' 4.5" (163.8 cm)  WT: 222 lb (100.699 kg)  BMI: 37.6   BP Readings from Last 3 Encounters:  05/10/13 128/77  04/11/13 140/81  03/12/13 147/86   Wt Readings from Last 3 Encounters:  05/10/13 222 lb (100.699 kg)  04/11/13 225 lb (102.059 kg)  03/12/13 228 lb (103.42 kg)     PHYSICAL EXAM: GENERAL:  adult cognitively delayed female. In no discomfort; no respiratory distress  PSYCH: alert and appropriate, good insight   HNEENT:   no JVD   CARDIO: RRR, S1/S2 heard, no murmur  LUNGS: CTA B, no wheezes, no crackles  ABDOMEN:  obese, +BS, soft, mild diffuse tenderness, no rigidity, no guarding, no masses/hepatosplenomegaly appreciated.    EXTREM:  Warm, well perfused.  Moves all 4 extremities spontaneously; no lateralization.  Distal pulses 2+/4.  no pretibial edema.  GU:   SKIN:  postinflammatory hyperpigmentation healing excoriations, dry skin on extremities but significantly improved on trunk      Assessment & Plan   Problems addressed today: General Plan & Pt Instructions:  1. Epigastric discomfort   2. HYPERTENSION   3. HYPOTHYROIDISM       Keep taking BP meds  Use a moisturizer multiple times per day.    Followup with physical therapy  we are checking labs again today.  Go get an abdominal x-ray.     For further discussion of A/P and for follow up issues see problem based charting.

## 2013-05-10 NOTE — Assessment & Plan Note (Signed)
Improved on lisinopril

## 2013-05-10 NOTE — Assessment & Plan Note (Signed)
Check TSH 

## 2013-05-10 NOTE — Assessment & Plan Note (Signed)
Status post cholecystectomy.  Suspect chronic constipation.  Will obtain x-ray of abdomen.  If appreciable stool burden will perform modified bowel cleanout and daily MiraLax.

## 2013-05-10 NOTE — Assessment & Plan Note (Signed)
Encouraged TLC (Therapeutic Lifestyle Changes)

## 2013-05-11 ENCOUNTER — Ambulatory Visit
Admission: RE | Admit: 2013-05-11 | Discharge: 2013-05-11 | Disposition: A | Discharge status: Home / Self Care | Payer: Medicare Other | Source: Ambulatory Visit | Attending: Family Medicine | Admitting: Family Medicine

## 2013-05-11 ENCOUNTER — Telehealth: Payer: Self-pay | Admitting: *Deleted

## 2013-05-11 DIAGNOSIS — R1013 Epigastric pain: Secondary | ICD-10-CM

## 2013-05-11 LAB — TSH: TSH: 2.602 u[IU]/mL (ref 0.350–4.500)

## 2013-05-11 MED ORDER — POLYETHYLENE GLYCOL 3350 17 GM/SCOOP PO POWD: ORAL | Status: DC

## 2013-05-11 NOTE — Progress Notes (Signed)
X-ray reveals large amount of stool in colon. Bowel clean out followed by twice daily miralax for 3 months. Please call mother and inform of Rx at pharmacy with instructions.  She will need to pick up 32oz of Gatorade to drink with Miralax today.

## 2013-05-11 NOTE — Addendum Note (Signed)
Addended by: Teresa Coombs D on: 05/11/2013 08:25 AM   Modules accepted: Orders

## 2013-05-11 NOTE — Telephone Encounter (Deleted)
Gerda Diss, DO at 05/11/2013 8:21 AM     Status: Signed        X-ray reveals large amount of stool in colon.  Bowel clean out followed by twice daily miralax for 3 months.  Please call mother and inform of Rx at pharmacy with instructions. She will need to pick up 32oz of Gatorade to drink with Miralax today.

## 2013-05-13 ENCOUNTER — Other Ambulatory Visit: Payer: Self-pay | Admitting: Sports Medicine

## 2013-05-14 NOTE — Telephone Encounter (Signed)
completed

## 2013-05-31 DIAGNOSIS — F71 Moderate intellectual disabilities: Secondary | ICD-10-CM | POA: Diagnosis not present

## 2013-05-31 DIAGNOSIS — F3110 Bipolar disorder, current episode manic without psychotic features, unspecified: Secondary | ICD-10-CM | POA: Diagnosis not present

## 2013-06-04 ENCOUNTER — Ambulatory Visit: Payer: Medicare Other | Attending: Sports Medicine

## 2013-06-04 DIAGNOSIS — M545 Low back pain, unspecified: Secondary | ICD-10-CM | POA: Insufficient documentation

## 2013-06-04 DIAGNOSIS — R262 Difficulty in walking, not elsewhere classified: Secondary | ICD-10-CM | POA: Diagnosis not present

## 2013-06-04 DIAGNOSIS — R293 Abnormal posture: Secondary | ICD-10-CM | POA: Insufficient documentation

## 2013-06-06 ENCOUNTER — Ambulatory Visit: Payer: Medicare Other | Admitting: Rehabilitation

## 2013-06-12 ENCOUNTER — Encounter: Payer: Medicare Other | Admitting: Rehabilitation

## 2013-06-18 ENCOUNTER — Ambulatory Visit: Payer: Medicare Other

## 2013-06-25 ENCOUNTER — Ambulatory Visit (INDEPENDENT_AMBULATORY_CARE_PROVIDER_SITE_OTHER): Payer: Medicare Other | Admitting: Sports Medicine

## 2013-06-25 ENCOUNTER — Encounter: Payer: Self-pay | Admitting: Sports Medicine

## 2013-06-25 VITALS — BP 137/78 | HR 91 | Temp 98.4°F | Ht 64.5 in | Wt 225.4 lb

## 2013-06-25 DIAGNOSIS — M76899 Other specified enthesopathies of unspecified lower limb, excluding foot: Secondary | ICD-10-CM | POA: Diagnosis not present

## 2013-06-25 DIAGNOSIS — M549 Dorsalgia, unspecified: Secondary | ICD-10-CM | POA: Diagnosis not present

## 2013-06-25 DIAGNOSIS — M7062 Trochanteric bursitis, left hip: Secondary | ICD-10-CM

## 2013-06-25 MED ORDER — MELOXICAM 15 MG PO TABS
15.0000 mg | ORAL_TABLET | Freq: Every day | ORAL | Status: DC
Start: 2013-06-25 — End: 2014-10-01

## 2013-06-25 NOTE — Patient Instructions (Signed)
   Exercises from PT for your back and while laying on your side.  Start with 5 reps, 3 times per day and work up to 15 reps, 3 times per day  Mobic for the next 7 days then as needed  Normal BMET with Cr of 1.17 & TSH of 2.602   If you need anything prior to your next visit please call the clinic. Please Bring all medications or accurate medication list with you to each appointment; an accurate medication list is essential in providing you the best care possible.

## 2013-06-25 NOTE — Assessment & Plan Note (Signed)
Problem Based Documentation:    Subjective Report:  Persistent worsening back now, left hip pain  Went to PT 2X but did not initiate home exercise regimen as she experienced some mild pain.  Today left hip pain, severity 1/10.  Left lateral, Nonradiating.  Worse with side lying.  Not taking any medications.  Not trying any specific exercises.     Assessment & Plan & Follow up Issues:  Subacute on chronic condition Left [employer] and her bursitis in the setting of chronic muscular skeletal back pain 1. Encouraged aggressive home exercise regimen and specifically reviewed left hip abduction exercises. 2. Meloxicam for short course to initiate activity > Consider return to formal physical therapy

## 2013-06-25 NOTE — Progress Notes (Signed)
Vanessa Alvarez - 28 y.o. female MRN [id number]  Date of birth: May 13, 1985  CC & Problems Addressed: General Plan & Pt Instructions:  Chief Complaint  Patient presents with  . Hip Pain      Exercises from PT for your back and while laying on your side.  Start with 5 reps, 3 times per day and work up to 15 reps, 3 times per day  Mobic for the next 7 days then as needed  Normal BMET with Cr of 1.17 & TSH of 2.602     SUBJECTIVE:   She reports left-sided hip pain in a setting of chronic back pain For further subjective including (HPI, Interval History & ROS) please see problem based charting  HISTORY: No Patient Care Coordination Note on file.   Recent Labs  10/20/12 0940 04/11/13 0934 05/10/13 0941  TRIG  --  203*  --   CHOL  --  239*  --   HDL  --  56  --   LDLCALC  --  142*  --   TSH 6.093*  --  2.602   Wt Readings from Last 3 Encounters:  06/25/13 225 lb 6.4 oz (102.241 kg)  05/10/13 222 lb (100.699 kg)  04/11/13 225 lb (102.059 kg)   BP Readings from Last 3 Encounters:  06/25/13 137/78  05/10/13 128/77  04/11/13 140/81    History  Smoking status  . Never Smoker   Smokeless tobacco  . Not on file   Health Maintenance Due  Topic  . Influenza Vaccine     Otherwise past Medical, Surgical, Social, and Family History Reviewed per EMR Medications and Allergies reviewed and updated per below.  VITALS: Reviewed per EMR.   PHYSICAL EXAM: GENERAL:  adult obese African American female. In no discomfort; no respiratory distress  PSYCH: alert and appropriate, good insight   EXTREM:   bilateral hip Exam: Appear:  normal-appearing overall however left anterior hip carriage   Palp:  tenderness palpation over bilateral greater trochanters, left greater than right   ROM:  restricted hip flexion to 40 on the right, 35 on left   NV:   bilateral lower extremity myotomes equal.  5 minus out of 5 diffusely with 4+ out of 5 in bilateral knee extension  Sensation grossly  intact   Testing:  left FABER positive radiating to back, bilateral negative logroll, bilateral negative Fager, left hip abductors 3+/5, right 4+/5     GU:   SKIN:     MEDICATIONS, LABS & OTHER ORDERS: Previous Medications   ABILIFY 5 MG TABLET    1 tablet.   DIVALPROEX (DEPAKOTE) 500 MG EC TABLET    2 tabs by mouth daily at bedtime    FERROUS SULFATE 325 (65 FE) MG TABLET    Take 1 tablet (325 mg total) by mouth 2 (two) times daily.   HYDROCORTISONE 2.5 % OINTMENT    APPLY TOPICALLY 2 (TWO) TIMES DAILY. TO POSTERIOR ARMS AND THIGH X 7-10 DAYS   LEVOTHYROXINE (SYNTHROID, LEVOTHROID) 112 MCG TABLET    TAKE 1 TABLET DAILY BEFORE BREAKFAST.   LISINOPRIL (PRINIVIL,ZESTRIL) 10 MG TABLET    Take 1 tablet (10 mg total) by mouth daily.   METOPROLOL (LOPRESSOR) 100 MG TABLET    TAKE 1 TABLET (100 MG TOTAL) BY MOUTH 2 (TWO) TIMES DAILY.   MINERAL OIL-HYDROPHILIC PETROLATUM (AQUAPHOR) OINTMENT    Apply topically as needed for dry skin.   PANTOPRAZOLE (PROTONIX) 40 MG TABLET    TAKE 1 TABLET (40 MG  TOTAL) BY MOUTH DAILY.   POLYETHYLENE GLYCOL POWDER (GLYCOLAX/MIRALAX) POWDER    Mix 6 capfuls in 32 oz of Gatorade and drink over 2-3 hour period X1 day.  Then Take 1 capful in 8oz of water bid for 3 months.   SERTRALINE (ZOLOFT) 100 MG TABLET    1 and 1/2 tabs by mouth daily    SKIN PROTECTANTS, MISC. (EUCERIN) CREAM    Apply topically as needed for wound care.   Modified Medications   No medications on file   New Prescriptions   MELOXICAM (MOBIC) 15 MG TABLET    Take 1 tablet (15 mg total) by mouth daily.   Discontinued Medications   No medications on file  No orders of the defined types were placed in this encounter.    ASSESSMENT & PLAN:  See problem based charting & AVS for pt instructions.

## 2013-07-08 ENCOUNTER — Other Ambulatory Visit: Payer: Self-pay | Admitting: Sports Medicine

## 2013-07-15 ENCOUNTER — Other Ambulatory Visit: Payer: Self-pay | Admitting: Sports Medicine

## 2013-08-22 ENCOUNTER — Encounter: Payer: Self-pay | Admitting: Sports Medicine

## 2013-08-22 ENCOUNTER — Ambulatory Visit (INDEPENDENT_AMBULATORY_CARE_PROVIDER_SITE_OTHER): Payer: Medicare Other | Admitting: Sports Medicine

## 2013-08-22 VITALS — BP 131/73 | HR 84 | Temp 99.2°F | Ht 64.5 in | Wt 222.0 lb

## 2013-08-22 DIAGNOSIS — R51 Headache: Secondary | ICD-10-CM | POA: Diagnosis not present

## 2013-08-22 DIAGNOSIS — F79 Unspecified intellectual disabilities: Secondary | ICD-10-CM

## 2013-08-22 DIAGNOSIS — H5500 Unspecified nystagmus: Secondary | ICD-10-CM | POA: Insufficient documentation

## 2013-08-22 DIAGNOSIS — Z0000 Encounter for general adult medical examination without abnormal findings: Secondary | ICD-10-CM

## 2013-08-22 MED ORDER — PROPRANOLOL HCL 60 MG PO TABS: 60.0000 mg | ORAL_TABLET | Freq: Two times a day (BID) | ORAL | Status: DC

## 2013-08-22 NOTE — Patient Instructions (Signed)
Please stop your metoprolol.  We are starting a new medicine called propranolol. If you continue to have headaches please record these in a long documentwhen they occur, how long they last,any associated symptoms and any triggers you can think of.   Migraine Headache A migraine headache is an intense, throbbing pain on one or both sides of your head. A migraine can last for 30 minutes to several hours. CAUSES  The exact cause of a migraine headache is not always known. However, a migraine may be caused when nerves in the brain become irritated and release chemicals that cause inflammation. This causes pain. Certain things may also trigger migraines, such as:  Alcohol.  Smoking.  Stress.  Menstruation.  Aged cheeses.  Foods or drinks that contain nitrates, glutamate, aspartame, or tyramine.  Lack of sleep.  Chocolate.  Caffeine.  Hunger.  Physical exertion.  Fatigue.  Medicines used to treat chest pain (nitroglycerine), birth control pills, estrogen, and some blood pressure medicines. SIGNS AND SYMPTOMS  Pain on one or both sides of your head.  Pulsating or throbbing pain.  Severe pain that prevents daily activities.  Pain that is aggravated by any physical activity.  Nausea, vomiting, or both.  Dizziness.  Pain with exposure to bright lights, loud noises, or activity.  General sensitivity to bright lights, loud noises, or smells. Before you get a migraine, you may get warning signs that a migraine is coming (aura). An aura may include:  Seeing flashing lights.  Seeing bright spots, halos, or zig-zag lines.  Having tunnel vision or blurred vision.  Having feelings of numbness or tingling.  Having trouble talking.  Having muscle weakness. DIAGNOSIS  A migraine headache is often diagnosed based on:  Symptoms.  Physical exam.  A CT scan or MRI of your head. These imaging tests cannot diagnose migraines, but they can help rule out other causes of  headaches. TREATMENT Medicines may be given for pain and nausea. Medicines can also be given to help prevent recurrent migraines.  HOME CARE INSTRUCTIONS  Only take over-the-counter or prescription medicines for pain or discomfort as directed by your health care provider. The use of long-term narcotics is not recommended.  Lie down in a dark, quiet room when you have a migraine.  Keep a journal to find out what may trigger your migraine headaches. For example, write down:  What you eat and drink.  How much sleep you get.  Any change to your diet or medicines.  Limit alcohol consumption.  Quit smoking if you smoke.  Get 7 9 hours of sleep, or as recommended by your health care provider.  Limit stress.  Keep lights dim if bright lights bother you and make your migraines worse. SEEK IMMEDIATE MEDICAL CARE IF:   Your migraine becomes severe.  You have a fever.  You have a stiff neck.  You have vision loss.  You have muscular weakness or loss of muscle control.  You start losing your balance or have trouble walking.  You feel faint or pass out.  You have severe symptoms that are different from your first symptoms. MAKE SURE YOU:   Understand these instructions.  Will watch your condition.  Will get help right away if you are not doing well or get worse. Document Released: 04/12/2005 Document Revised: 01/31/2013 Document Reviewed: 12/18/2012 [facility] Patient Information 2014 [place].

## 2013-08-22 NOTE — Assessment & Plan Note (Signed)
Chronic, recurrent condition - seem to be increasing in duration and frequency.  Has not been keeping walks.  Unsure as to triggers. 1. Change metoprolol to propranolol in hopes of obtaining additional prophylactic effect. 2. Reviewed appropriate headache management techniques including early NSAIDs and hydration. > encouraged a followup with headache diary.  Given significant nystagmus on exam and no mental retardation I would favor having a lower threshold for advanced imaging of her brain but there are no red flags at this time that warned early imaging.  Red flags reviewed.

## 2013-08-22 NOTE — Assessment & Plan Note (Signed)
Unclear etiology but given headaches would have a low threshold for advanced imaging if any further concerns arise. She has never been evaluated by neurology and has no known seizure disorders previously.

## 2013-08-22 NOTE — Progress Notes (Signed)
Vanessa Alvarez - 28 y.o. female MRN [id number]  Date of birth: 1985/06/25  CC & SUBJECTIVE:    Headache  HPI HPI  Patient presents with:   Headache - reporting migranes - last week x 2 days Similar to prior episodes.  Typically no associated aura.  His previous been seen for this but has not been keeping a headache diary as last recommended by Dr. Luberta Mutter.  Previously he seen and evaluated nystagmus by ophthalmologist who reported this is a normal finding.  No associated visual changes or difficulty focusing with the onset of headaches.  Described as a frontal, bilateral throbbing headache.  Typically last approximately 24 hours.  Ibuprofen and hydration seems to help this but occasionally they will escalate.  They have been occurring approximately 3-4 times per month.  No associated nausea, diaphoresis, vomiting, photosensitivity or light sensitivity. See problem based charting for additional problem specific subjective (including HPI, Interval History & ROS)   HISTORY:  Recent Labs  10/20/12 0940 04/11/13 0934 05/10/13 0941  TRIG  --  203*  --   CHOL  --  239*  --   HDL  --  56  --   LDLCALC  --  142*  --   TSH 6.093*  --  2.602   Wt Readings from Last 3 Encounters:  08/22/13 222 lb (100.699 kg)  06/25/13 225 lb 6.4 oz (102.241 kg)  05/10/13 222 lb (100.699 kg)   BP Readings from Last 3 Encounters:  08/22/13 131/73  06/25/13 137/78  05/10/13 128/77    History  Smoking status  . Never Smoker   Smokeless tobacco  . Not on file   There are no preventive care reminders to display for this patient.  }Otherwise past Medical, Surgical, Social, and Family History Reviewed per EMR Medications and Allergies reviewed and updated per below.  VITALS: BP 131/73  Pulse 84  Temp(Src) 99.2 F (37.3 C) (Oral)  Ht 5' 4.5" (1.638 m)  Wt 222 lb (100.699 kg)  BMI 37.53 kg/m2  PHYSICAL EXAM: Physical Exam  Vitals reviewed. Constitutional: She appears well-developed and well-nourished. No  distress.  HENT:  Head: Normocephalic and atraumatic.  Right Ear: External ear normal.  Left Ear: External ear normal.  Eyes: Right eye exhibits abnormal extraocular motion and nystagmus. Left eye exhibits abnormal extraocular motion and nystagmus.  Horizontal and vertical nystagmus equally bilaterally  Neck: Normal range of motion. Neck supple. No JVD present. No tracheal deviation present. Thyromegaly present.  Cardiovascular: Normal rate, regular rhythm and normal heart sounds.  Exam reveals no gallop and no friction rub.   No murmur heard. Pulmonary/Chest: Effort normal and breath sounds normal. No stridor. No respiratory distress.  Lymphadenopathy:    She has no cervical adenopathy.  Neurological: She is alert. She has normal strength. A cranial nerve deficit is present. No sensory deficit.  EOM with B nystagmus.  No gaze deficit.  CN V, VII, IX, X, XI intact  Skin: Skin is warm and dry. She is not diaphoretic. No erythema.  Psychiatric: She has a normal mood and affect. Her behavior is normal. Judgment and thought content normal.    MEDICATIONS, LABS & OTHER ORDERS: Previous Medications   ABILIFY 5 MG TABLET    1 tablet.   DIVALPROEX (DEPAKOTE) 500 MG EC TABLET    2 tabs by mouth daily at bedtime    FERROUS SULFATE 325 (65 FE) MG TABLET    Take 1 tablet (325 mg total) by mouth 2 (two) times daily.  HYDROCORTISONE 2.5 % OINTMENT    APPLY TOPICALLY 2 (TWO) TIMES DAILY. TO POSTERIOR ARMS AND THIGH X 7-10 DAYS   LEVOTHYROXINE (SYNTHROID, LEVOTHROID) 112 MCG TABLET    TAKE 1 TABLET DAILY BEFORE BREAKFAST.   LISINOPRIL (PRINIVIL,ZESTRIL) 10 MG TABLET    Take 1 tablet (10 mg total) by mouth daily.   MELOXICAM (MOBIC) 15 MG TABLET    Take 1 tablet (15 mg total) by mouth daily.   MINERAL OIL-HYDROPHILIC PETROLATUM (AQUAPHOR) OINTMENT    Apply topically as needed for dry skin.   PANTOPRAZOLE (PROTONIX) 40 MG TABLET    TAKE 1 TABLET (40 MG TOTAL) BY MOUTH DAILY.   SERTRALINE (ZOLOFT) 100 MG  TABLET    1 and 1/2 tabs by mouth daily    SKIN PROTECTANTS, MISC. (EUCERIN) CREAM    Apply topically as needed for wound care.   Modified Medications   No medications on file   New Prescriptions   PROPRANOLOL (INDERAL) 60 MG TABLET    Take 1 tablet (60 mg total) by mouth 2 (two) times daily.   Discontinued Medications   METOPROLOL (LOPRESSOR) 100 MG TABLET    TAKE 1 TABLET (100 MG TOTAL) BY MOUTH 2 (TWO) TIMES DAILY.   POLYETHYLENE GLYCOL POWDER (GLYCOLAX/MIRALAX) POWDER    Mix 6 capfuls in 32 oz of Gatorade and drink over 2-3 hour period X1 day.  Then Take 1 capful in 8oz of water bid for 3 months.  No orders of the defined types were placed in this encounter.   ASSESSMENT & PLAN: See problem based charting & AVS for pt instructions.

## 2013-10-02 ENCOUNTER — Other Ambulatory Visit: Payer: Self-pay | Admitting: Sports Medicine

## 2013-10-07 ENCOUNTER — Other Ambulatory Visit: Payer: Self-pay | Admitting: Sports Medicine

## 2013-10-11 ENCOUNTER — Encounter: Payer: Self-pay | Admitting: Family Medicine

## 2013-10-11 ENCOUNTER — Ambulatory Visit (INDEPENDENT_AMBULATORY_CARE_PROVIDER_SITE_OTHER): Payer: Medicare Other | Admitting: Family Medicine

## 2013-10-11 VITALS — BP 109/60 | HR 84 | Temp 99.5°F | Ht 64.5 in | Wt 222.0 lb

## 2013-10-11 DIAGNOSIS — R05 Cough: Secondary | ICD-10-CM | POA: Insufficient documentation

## 2013-10-11 DIAGNOSIS — F3110 Bipolar disorder, current episode manic without psychotic features, unspecified: Secondary | ICD-10-CM | POA: Diagnosis not present

## 2013-10-11 DIAGNOSIS — R059 Cough, unspecified: Secondary | ICD-10-CM | POA: Insufficient documentation

## 2013-10-11 DIAGNOSIS — F71 Moderate intellectual disabilities: Secondary | ICD-10-CM | POA: Diagnosis not present

## 2013-10-11 MED ORDER — ONDANSETRON HCL 4 MG PO TABS: 4.0000 mg | ORAL_TABLET | Freq: Three times a day (TID) | ORAL | Status: DC | PRN

## 2013-10-11 MED ORDER — RANITIDINE HCL 150 MG PO TABS: 150.0000 mg | ORAL_TABLET | Freq: Two times a day (BID) | ORAL | Status: DC

## 2013-10-11 NOTE — Patient Instructions (Signed)
Thank you for coming in, today!  I think there could be several things causing Vanessa Alvarez's cough. It could be related to her blood pressure medicine (lisinopril), her reflux, and/or a cold, or something else.  For now I want her to start taking a daily medicine called ranitidine (Zantac). It will work with her Protonix to reduce reflux symptoms. She can also take a medicine called Zofran (ondansetron) for nausea as needed.  Try these medicines for a couple of weeks to see if they help. That will also be a good amount of time to see if this is a cold that clear up. After July 1st, call to see who her new doctor will be after Dr. Paulla Fore, and come back in to see how things are going. Please feel free to call with any questions or concerns at any time, at [phone]. --Dr. Venetia Maxon

## 2013-10-11 NOTE — Progress Notes (Signed)
   Subjective:    Patient ID: Vanessa Alvarez, female    DOB: Aug 29, 1985, 28 y.o.   MRN: [id number]  HPI: Pt presents to clinic with mother for SDA for cough and occasional post-tussive emesis, for a couple of weeks. She has had some symptoms off and on for a month or more with no definite / specific trigger(s). She has had no sore throat, runny nose, itchy eyes, fevers / chills, or SOB. Her cough is typically not productive but she does sometimes have some increased phlegm. She denies sinus pressure or stuffiness. She has had no sick contacts. Of note, she does have GERD-like symptoms and takes lisinopril for blood pressure. She has no diagnosis of asthma and has never used inhalers. She also does not typically have significant seasonal or environmental allergies. She has not tried any specific medications over-the-counter or otherwise.  Review of Systems: As above.     Objective:   Physical Exam BP 109/60  Pulse 84  Temp(Src) 99.5 F (37.5 C) (Oral)  Ht 5' 4.5" (1.638 m)  Wt 222 lb (100.699 kg)  BMI 37.53 kg/m2 Gen: well-appearing Vanessa Alvarez adult female in NAD HEENT: [place]/AT, EOMI, PERRLA, TM's clear bilaterally  MMM, posterior oropharynx very mildly red, no tonsillar exudate  No neck tenderness / fullness, no cervical lymphadenopathy Cardio: RRR, no murmur Pulm: CTAB, no wheezes, no cough with deep breathing Abd: soft, nontender, BS+; no increased pain or cough with deep palpation of epigastrium Ext: warm, well-perfused     Assessment & Plan:

## 2013-10-11 NOTE — Assessment & Plan Note (Signed)
A: Uncertain etiology of cough; possible URI but no other frank coryza-type symptoms or obvious physical exam findings. Previous Dx of GERD, on Protonix, and pt does take ACE for HTN, so multiple reasons for her to have cough. Otherwise completely benign history and exam.  P: Rx for ranitidine BID for multimodal GERD treatment in case this is contributing. Also provided Rx for Zofran PRN for nausea related to cough. Discussed with pt's mother that there are several reasons for pt's cough and that these medicines could help and should have no interaction with other meds and should have few if any side effects. Could hold or change ACE, but would defer this to PCP. Instructed mother to try these meds for a couple of weeks, then call after July 1st to f/u with pt's new PCP, as Dr. Paulla Fore is graduating. Mother voiced understanding.

## 2013-11-14 ENCOUNTER — Other Ambulatory Visit: Payer: Self-pay | Admitting: Sports Medicine

## 2013-11-16 ENCOUNTER — Other Ambulatory Visit: Payer: Self-pay | Admitting: Sports Medicine

## 2013-11-20 ENCOUNTER — Telehealth: Payer: Self-pay | Admitting: Family Medicine

## 2013-11-20 MED ORDER — LEVOTHYROXINE SODIUM 112 MCG PO TABS: ORAL_TABLET | ORAL | Status: DC

## 2013-11-20 NOTE — Telephone Encounter (Signed)
Refill request for Synthroid. Medication has been refused twice due to it being forward to Dr. Paulla Fore at [facility]. Please refills asap. Patient completely out.

## 2013-11-20 NOTE — Telephone Encounter (Signed)
Left message on voicemail.will wait til mother returns call.Vanessa Alvarez, Vanessa Alvarez

## 2013-11-20 NOTE — Telephone Encounter (Signed)
LVM for mother to call back. I have refilled patient's synthroid. Patient needs to make an appointment to see her new PCP.

## 2013-11-21 NOTE — Telephone Encounter (Signed)
Relayed message,patient;s mother voiced understanding.Vanessa Alvarez, Lewie Loron

## 2013-12-03 ENCOUNTER — Other Ambulatory Visit: Payer: Self-pay | Admitting: Family Medicine

## 2014-01-02 ENCOUNTER — Encounter: Payer: Self-pay | Admitting: Family Medicine

## 2014-01-02 ENCOUNTER — Ambulatory Visit (INDEPENDENT_AMBULATORY_CARE_PROVIDER_SITE_OTHER): Payer: Medicare Other | Admitting: Family Medicine

## 2014-01-02 VITALS — BP 134/84 | HR 80 | Temp 98.5°F | Wt 218.0 lb

## 2014-01-02 DIAGNOSIS — R059 Cough, unspecified: Secondary | ICD-10-CM | POA: Diagnosis not present

## 2014-01-02 DIAGNOSIS — K219 Gastro-esophageal reflux disease without esophagitis: Secondary | ICD-10-CM | POA: Diagnosis not present

## 2014-01-02 DIAGNOSIS — R05 Cough: Secondary | ICD-10-CM

## 2014-01-02 LAB — POCT H PYLORI SCREEN: H Pylori Screen, POC: NEGATIVE

## 2014-01-02 NOTE — Assessment & Plan Note (Addendum)
Patient with persistent symptoms that on history today sound most consistent with reflux. 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 Plan: will get h. Pylori screen (negative), add bismuth therapy with meals, f/u 1 month and may need GI referral vs trial off ACEi.

## 2014-01-02 NOTE — Progress Notes (Signed)
Patient ID: Vanessa Alvarez, female   DOB: Aug 06, 1985, 28 y.o.   MRN: [id number]   Subjective:    Patient ID: Vanessa Alvarez, female    DOB: Mar 18, 1986, 28 y.o.   MRN: [id number]  HPI  CC: Cough for 2 months  # Cough:  Present for 2 months  Throughout the day, worse after eating  Has nausea and some episodes of vomiting (states about once a week)  Denies abdominal pain, can't say whether she has heartburn  Review of Systems   See HPI for ROS. All other systems reviewed and are negative.  Past medical history, surgical, family, and social history reviewed and updated in the EMR. No new updates were made today. Objective:  BP 134/84  Pulse 80  Temp(Src) 98.5 F (36.9 C) (Oral)  Wt 218 lb (98.884 kg)  LMP 12/02/2013 Vitals reviewed  General: NAD CV: RRR, normal s1 and s2, no murmurs Resp: CTAB, normal effort Abdomen: soft, obese, nontender, nondistended, no hepatosplenomegaly, normal bowel sounds  Assessment & Plan:  See Problem List Documentation'

## 2014-01-02 NOTE — Patient Instructions (Addendum)
It was nice to meet you today.  I believe the cough you are having may be caused by reflux. You are already on 2 medications that will help this, Protonix (pantoprazole), and ranitidine. Continue taking these medications.  We will get a blood test today to see if you have an infection in the stomach called H. Pylori. If this comes back positive we will call you and tell you about the antibiotics to take.  In the meantime, focus on eating smaller meals to help with any symptoms. Avoid laying down after eating. Try adding peptobismol with each meal. I will send this in as a prescription.  Follow up in about 1 month and if you are still having symptoms, we may need to refer you to a GI specialist

## 2014-01-07 ENCOUNTER — Other Ambulatory Visit: Payer: Self-pay | Admitting: *Deleted

## 2014-01-07 MED ORDER — PANTOPRAZOLE SODIUM 40 MG PO TBEC: DELAYED_RELEASE_TABLET | ORAL | Status: DC

## 2014-01-08 ENCOUNTER — Other Ambulatory Visit: Payer: Self-pay | Admitting: *Deleted

## 2014-01-08 MED ORDER — PANTOPRAZOLE SODIUM 40 MG PO TBEC: DELAYED_RELEASE_TABLET | ORAL | Status: DC

## 2014-01-14 DIAGNOSIS — F71 Moderate intellectual disabilities: Secondary | ICD-10-CM | POA: Diagnosis not present

## 2014-02-04 ENCOUNTER — Other Ambulatory Visit: Payer: Self-pay | Admitting: *Deleted

## 2014-02-04 NOTE — Telephone Encounter (Signed)
Sent to Dr. Phelps(covering provider for Dr. Lajuana Ripple).  Derl Barrow, RN

## 2014-02-05 MED ORDER — PROPRANOLOL HCL 60 MG PO TABS: 60.0000 mg | ORAL_TABLET | Freq: Two times a day (BID) | ORAL | Status: DC

## 2014-02-05 NOTE — Addendum Note (Signed)
Addended by: Katheren Shams on: 02/05/2014 12:36 PM   Modules accepted: Orders

## 2014-02-07 ENCOUNTER — Other Ambulatory Visit: Payer: Self-pay | Admitting: Family Medicine

## 2014-03-04 ENCOUNTER — Ambulatory Visit (INDEPENDENT_AMBULATORY_CARE_PROVIDER_SITE_OTHER): Payer: Medicare Other | Admitting: Family Medicine

## 2014-03-04 ENCOUNTER — Encounter: Payer: Self-pay | Admitting: Family Medicine

## 2014-03-04 VITALS — BP 137/86 | HR 67 | Temp 98.6°F | Ht 64.5 in | Wt 221.0 lb

## 2014-03-04 DIAGNOSIS — R059 Cough, unspecified: Secondary | ICD-10-CM

## 2014-03-04 DIAGNOSIS — R05 Cough: Secondary | ICD-10-CM | POA: Diagnosis not present

## 2014-03-04 NOTE — Progress Notes (Signed)
Patient ID: Vanessa Alvarez, female   DOB: 08/26/1985, 28 y.o.   MRN: [id number]  Tommi Rumps, MD Phone: [phone]  Hisaye Alvarez is a 28 y.o. female who presents today for same day appointment.   Cough: patient presents as follow-up for cough. This issue is unchanged from her last visit. Notes this has been an issue for several months. Non-productive. Associated with nausea and vomiting once a week. Was tested for h pylori at her last visit and this was negative.  Notes reflux 1-2x/week with no radiation of burning. No sour taste. No weight loss. No post nasal drip, congestion or fevers. Feels as though reflux may get worse with eating. Feels as though there is something in her throat intermittently. No difficulty swallowing. She is on an ACEI.  Patient is a nonsmoker.   ROS: Per HPI   Physical Exam Filed Vitals:   03/04/14 0911  BP: 137/86  Pulse: 67  Temp: 98.6 F (37 C)    Gen: Well NAD HEENT: PERRL,  MMM, no OP erythema, neck supple Lungs: CTABL Nl WOB Heart: RRR no MRG Abd: soft, NT, ND Exts: Non edematous BL  LE, warm and well perfused.    Assessment/Plan: Please see individual problem list.  Tommi Rumps, MD [place] PGY-3

## 2014-03-04 NOTE — Patient Instructions (Signed)
Nice to meet you. Please stop the lisinopril.  Please check your blood pressure every day. If it is higher than 140/90 more than 2 days in a row, please let us know. If it is higher than 150/100 at any time please let us know.  If she develops shortness of breath, productive cough, fever, trouble swallowing, worsening reflux, or chest pain please let us know.

## 2014-03-04 NOTE — Assessment & Plan Note (Signed)
Patient with continued cough that could be related to reflux vs ACE inhibitor use. I discussed these causes and possible treatment options with the patient and her mother. These included trial off of ACEI vs referral to GI. Patient decided on trial off of ACEI at this time. Discussed monitoring her BP at home with change. If >140/90 more than 2 days or >150/100 at any point to call our office. She will follow-up in 2 weeks to determine if this has helped with her cough. If it has not I would consider referral to GI for further evaluation given her reflux. Given return precautions.

## 2014-04-01 DIAGNOSIS — F71 Moderate intellectual disabilities: Secondary | ICD-10-CM | POA: Diagnosis not present

## 2014-04-01 DIAGNOSIS — F319 Bipolar disorder, unspecified: Secondary | ICD-10-CM | POA: Diagnosis not present

## 2014-04-07 ENCOUNTER — Other Ambulatory Visit: Payer: Self-pay | Admitting: Obstetrics and Gynecology

## 2014-04-09 ENCOUNTER — Other Ambulatory Visit: Payer: Self-pay | Admitting: Obstetrics and Gynecology

## 2014-05-10 DIAGNOSIS — H52223 Regular astigmatism, bilateral: Secondary | ICD-10-CM | POA: Diagnosis not present

## 2014-05-10 DIAGNOSIS — H5213 Myopia, bilateral: Secondary | ICD-10-CM | POA: Diagnosis not present

## 2014-05-10 DIAGNOSIS — H5501 Congenital nystagmus: Secondary | ICD-10-CM | POA: Diagnosis not present

## 2014-05-17 ENCOUNTER — Other Ambulatory Visit: Payer: Self-pay | Admitting: Family Medicine

## 2014-05-18 NOTE — Telephone Encounter (Signed)
RF x1.  Patient needs office visit for Thyroid.  Has not had TSH drawn in over a year.  Thanks.

## 2014-05-19 ENCOUNTER — Other Ambulatory Visit: Payer: Self-pay | Admitting: Family Medicine

## 2014-05-21 NOTE — Telephone Encounter (Signed)
Left voice message for pt to call and schedule a follow up appt with provider for her thyroid.  Derl Barrow, RN

## 2014-06-04 DIAGNOSIS — F319 Bipolar disorder, unspecified: Secondary | ICD-10-CM | POA: Diagnosis not present

## 2014-06-04 DIAGNOSIS — F71 Moderate intellectual disabilities: Secondary | ICD-10-CM | POA: Diagnosis not present

## 2014-06-05 ENCOUNTER — Other Ambulatory Visit: Payer: Self-pay | Admitting: *Deleted

## 2014-06-05 MED ORDER — RANITIDINE HCL 150 MG PO TABS: ORAL_TABLET | ORAL | Status: DC

## 2014-06-06 ENCOUNTER — Other Ambulatory Visit: Payer: Self-pay | Admitting: Family Medicine

## 2014-06-06 NOTE — Telephone Encounter (Signed)
Reordered yesterday w/1 RF.  Please verify

## 2014-06-06 NOTE — Telephone Encounter (Signed)
Refilled already

## 2014-06-19 ENCOUNTER — Other Ambulatory Visit: Payer: Self-pay | Admitting: Family Medicine

## 2014-06-19 DIAGNOSIS — E039 Hypothyroidism, unspecified: Secondary | ICD-10-CM

## 2014-06-19 NOTE — Progress Notes (Signed)
Spoke on the phone.  Patient to come in on Friday for thyroid testing.  Will refill Rx after that.  Patient still has some medication left.  Ashly M. Lajuana Ripple, DO PGY-1, [place]

## 2014-06-21 ENCOUNTER — Other Ambulatory Visit: Payer: Medicare Other

## 2014-06-21 DIAGNOSIS — E039 Hypothyroidism, unspecified: Secondary | ICD-10-CM | POA: Diagnosis not present

## 2014-06-21 LAB — TSH: TSH: 2.586 u[IU]/mL (ref 0.350–4.500)

## 2014-06-21 NOTE — Progress Notes (Signed)
[facility] phlebotomist drew:  TSH

## 2014-06-30 ENCOUNTER — Other Ambulatory Visit: Payer: Self-pay | Admitting: Family Medicine

## 2014-07-05 ENCOUNTER — Other Ambulatory Visit: Payer: Self-pay | Admitting: Family Medicine

## 2014-08-02 ENCOUNTER — Other Ambulatory Visit: Payer: Self-pay | Admitting: Family Medicine

## 2014-08-02 ENCOUNTER — Other Ambulatory Visit: Payer: Self-pay | Admitting: Obstetrics and Gynecology

## 2014-08-02 NOTE — Telephone Encounter (Signed)
Please have patient schedule an office visit for BP check/ chronic disease management.  It appears she stopped taking Lisinopril at last appt.  Thank you

## 2014-08-06 ENCOUNTER — Other Ambulatory Visit: Payer: Self-pay | Admitting: Family Medicine

## 2014-09-02 ENCOUNTER — Other Ambulatory Visit: Payer: Self-pay | Admitting: Family Medicine

## 2014-09-02 NOTE — Telephone Encounter (Signed)
Will refill x1 but patient MUST have office visit for ANY more refills.  Please relay this.  Thank you so much.

## 2014-09-03 DIAGNOSIS — F71 Moderate intellectual disabilities: Secondary | ICD-10-CM | POA: Diagnosis not present

## 2014-09-08 ENCOUNTER — Other Ambulatory Visit: Payer: Self-pay | Admitting: Family Medicine

## 2014-09-11 ENCOUNTER — Other Ambulatory Visit: Payer: Self-pay | Admitting: Family Medicine

## 2014-09-15 ENCOUNTER — Other Ambulatory Visit: Payer: Self-pay | Admitting: Family Medicine

## 2014-09-25 ENCOUNTER — Encounter: Payer: Self-pay | Admitting: Family Medicine

## 2014-09-25 ENCOUNTER — Ambulatory Visit (INDEPENDENT_AMBULATORY_CARE_PROVIDER_SITE_OTHER): Payer: Medicare Other | Admitting: Family Medicine

## 2014-09-25 VITALS — BP 135/86 | HR 91 | Temp 99.0°F | Ht 64.0 in | Wt 229.1 lb

## 2014-09-25 DIAGNOSIS — E039 Hypothyroidism, unspecified: Secondary | ICD-10-CM | POA: Diagnosis not present

## 2014-09-25 DIAGNOSIS — E669 Obesity, unspecified: Secondary | ICD-10-CM

## 2014-09-25 DIAGNOSIS — D509 Iron deficiency anemia, unspecified: Secondary | ICD-10-CM

## 2014-09-25 DIAGNOSIS — R1013 Epigastric pain: Secondary | ICD-10-CM

## 2014-09-25 DIAGNOSIS — I10 Essential (primary) hypertension: Secondary | ICD-10-CM

## 2014-09-25 MED ORDER — PROPRANOLOL HCL 60 MG PO TABS: 60.0000 mg | ORAL_TABLET | Freq: Two times a day (BID) | ORAL | Status: DC

## 2014-09-25 MED ORDER — RANITIDINE HCL 150 MG PO TABS: ORAL_TABLET | ORAL | Status: DC

## 2014-09-25 MED ORDER — LEVOTHYROXINE SODIUM 112 MCG PO TABS: ORAL_TABLET | ORAL | Status: DC

## 2014-09-25 NOTE — Progress Notes (Signed)
Patient ID: Vanessa Alvarez, female   DOB: Aug 24, 1985, 29 y.o.   MRN: [id number]    Subjective: CC:L hip pain HPI: Patient is a 29 y.o. female presenting to clinic today for follow up. Concerns today include:  1. L hip pain/ low back pain  Patient reports symptoms started a couple of weeks ago and has resolved.  Has been laying down to help it.  Pain is described as achy in nature 3/10 at its worst, is 0/10 today.  No falls, no sensation changes, no fecal incontinence or urinary retention.  2. Hypothyroidism Patient reports that she is doing well overall.  She does endorse some fatigue.  Her mother reports that patient has gained weight over the last year.  She also asks what patient can do to lose weight.  She reports that patient eats whatever she wants and does not exercise.  Cycles regular.  LMP 08/2014.  Not sexually active.  Denies constipation, diarrhea, palpitations.    3. Acid reflux Well controlled on Zantac.  Recently ran out of Protonix.  H pylori negative in past.  Discussed trial of discontinuation of protonix.  No abdominal pain, CP, belching, bloating.  Social History Reviewed: non smoker. FamHx and MedHx updated.  Please see EMR. Health Maintenance: pap due, patient declines at this time; not sexually active  ROS: All other systems reviewed and are negative.  Objective: Office vital signs reviewed. BP 135/86 mmHg  Pulse 91  Temp(Src) 99 F (37.2 C) (Oral)  Ht 5\' 4"  (1.626 m)  Wt 229 lb 1 oz (103.902 kg)  BMI 39.30 kg/m2  LMP 09/13/2014 (Approximate)  Physical Examination:  General: Awake, alert, obese female, NAD Neck: No masses palpated. No thyromegaly. Cardio: RRR, S1S2 heard, no murmurs appreciated Pulm: CTAB, no wheezes, rhonchi or rales, no increased WOB Extremities: WWP, No edema, cyanosis or clubbing; +2 pulses bilaterally MSK: Normal gait and station, FROM, strength normal Skin: dry, intact, no rashes or lesions  06/21/14: TSH 2.586  Assessment: 28  y.o. female with resolved LBP and hip pain.  Also with h/o hypothyroidism  Plan: See Problem List and After Visit Summary   [name], DO PGY-1, [place]

## 2014-09-25 NOTE — Patient Instructions (Signed)
It was a pleasure seeing you today, [name]!  Information regarding what we discussed is included in this packet. We will plan to see eachother again in about 6 months to follow up on how your weight loss is going.  Fasting labs have been scheduled for you. I will contact you will the results of your labs.  If anything is abnormal, I will call you.  Otherwise, expect a copy to be mailed to you.  Continue to take your medications as directed.  You can STOP taking Pantoprozole.  Take Ranitidine for acid reflux.  You can take Tums if you have an acid reflux flare up.  Please feel free to call our office at [phone] if any questions or concerns arise.  Warm Regards, Lorelei Heikkila M. Jamiah Recore, DO   Exercise to Lose Weight Exercise and a healthy diet may help you lose weight. Your doctor may suggest specific exercises. EXERCISE IDEAS AND TIPS  Choose low-cost things you enjoy doing, such as walking, bicycling, or exercising to workout videos.  Take stairs instead of the elevator.  Walk during your lunch break.  Park your car further away from work or school.  Go to a gym or an exercise class.  Start with 5 to 10 minutes of exercise each day. Build up to 30 minutes of exercise 4 to 6 days a week.  Wear shoes with good support and comfortable clothes.  Stretch before and after working out.  Work out until you breathe harder and your heart beats faster.  Drink extra water when you exercise.  Do not do so much that you hurt yourself, feel dizzy, or get very short of breath. Exercises that burn about 150 calories:  Running 1  miles in 15 minutes.  Playing volleyball for 45 to 60 minutes.  Washing and waxing a car for 45 to 60 minutes.  Playing touch football for 45 minutes.  Walking 1  miles in 35 minutes.  Pushing a stroller 1  miles in 30 minutes.  Playing basketball for 30 minutes.  Raking leaves for 30 minutes.  Bicycling 5 miles in 30 minutes.  Walking 2 miles in 30  minutes.  Dancing for 30 minutes.  Shoveling snow for 15 minutes.  Swimming laps for 20 minutes.  Walking up stairs for 15 minutes.  Bicycling 4 miles in 15 minutes.  Gardening for 30 to 45 minutes.  Jumping rope for 15 minutes.  Washing windows or floors for 45 to 60 minutes. Document Released: 05/15/2010 Document Revised: 07/05/2011 Document Reviewed: 05/15/2010 [facility] Patient Information 2015 [place], [place]. This information is not intended to replace advice given to you by your health care provider. Make sure you discuss any questions you have with your health care provider.

## 2014-09-27 ENCOUNTER — Other Ambulatory Visit: Payer: Self-pay | Admitting: Family Medicine

## 2014-09-28 NOTE — Assessment & Plan Note (Signed)
BP at goal. -Refilled Propranolol today -Encouraged balanced diet and exercise.   -Patient interested in weight loss.  Would consider referral to Dietician if patient unable to make progress on her own.

## 2014-09-28 NOTE — Assessment & Plan Note (Signed)
Patient with 8lb weight gain since November.  Mother endorsing poor diet and sedentary lifestyle -Encouraged lifestyle modification, esp in the setting of HTN and impaired glucose tolerance -CMET -Would consider referral to dietician if patient unable to reach goals  -Will continue to follow and provide support

## 2014-09-28 NOTE — Assessment & Plan Note (Signed)
TSH reviewed with patient and mother.  WNL. -Continue current dose of synthroid -CBC ordered in setting of fatigue. Though suspect that patient's lifestyle may be impacting low energy.

## 2014-09-30 ENCOUNTER — Other Ambulatory Visit: Payer: Medicare Other

## 2014-09-30 DIAGNOSIS — I10 Essential (primary) hypertension: Secondary | ICD-10-CM

## 2014-09-30 DIAGNOSIS — D509 Iron deficiency anemia, unspecified: Secondary | ICD-10-CM | POA: Diagnosis not present

## 2014-09-30 LAB — COMPLETE METABOLIC PANEL WITH GFR
ALT: <8 U/L (ref 0–35)
AST: 11 U/L (ref 0–37)
Albumin: 3.3 g/dL — ABNORMAL LOW (ref 3.5–5.2)
Alkaline Phosphatase: 63 U/L (ref 39–117)
BUN: 21 mg/dL (ref 6–23)
CALCIUM: 8.7 mg/dL (ref 8.4–10.5)
CHLORIDE: 107 meq/L (ref 96–112)
CO2: 19 mEq/L (ref 19–32)
CREATININE: 2.33 mg/dL — AB (ref 0.50–1.10)
GFR, EST AFRICAN AMERICAN: 32 mL/min — AB
GFR, EST NON AFRICAN AMERICAN: 27 mL/min — AB
GLUCOSE: 92 mg/dL (ref 70–99)
POTASSIUM: 4.5 meq/L (ref 3.5–5.3)
Sodium: 138 mEq/L (ref 135–145)
Total Bilirubin: 0.2 mg/dL (ref 0.2–1.2)
Total Protein: 6.8 g/dL (ref 6.0–8.3)

## 2014-09-30 LAB — CBC
HCT: 30.5 % — ABNORMAL LOW (ref 36.0–46.0)
HEMOGLOBIN: 9.7 g/dL — AB (ref 12.0–15.0)
MCH: 23.2 pg — AB (ref 26.0–34.0)
MCHC: 31.8 g/dL (ref 30.0–36.0)
MCV: 73 fL — ABNORMAL LOW (ref 78.0–100.0)
MPV: 10.1 fL (ref 8.6–12.4)
PLATELETS: 253 10*3/uL (ref 150–400)
RBC: 4.18 MIL/uL (ref 3.87–5.11)
RDW: 18.1 % — AB (ref 11.5–15.5)
WBC: 7.6 10*3/uL (ref 4.0–10.5)

## 2014-09-30 NOTE — Progress Notes (Signed)
I was preceptor the day of this visit.   

## 2014-09-30 NOTE — Progress Notes (Signed)
cmp and cbc done today [facility] Vanessa Alvarez

## 2014-10-01 ENCOUNTER — Other Ambulatory Visit: Payer: Self-pay | Admitting: Family Medicine

## 2014-10-01 ENCOUNTER — Encounter: Payer: Self-pay | Admitting: Family Medicine

## 2014-10-01 ENCOUNTER — Other Ambulatory Visit: Payer: Self-pay | Admitting: *Deleted

## 2014-10-01 DIAGNOSIS — R7989 Other specified abnormal findings of blood chemistry: Secondary | ICD-10-CM

## 2014-10-02 ENCOUNTER — Telehealth: Payer: Self-pay | Admitting: Family Medicine

## 2014-10-02 NOTE — Telephone Encounter (Signed)
Returned dr Lajuana Ripple call

## 2014-10-03 NOTE — Telephone Encounter (Signed)
Tried to call back and line busy.

## 2014-10-04 ENCOUNTER — Other Ambulatory Visit: Payer: Medicare Other

## 2014-10-04 DIAGNOSIS — R748 Abnormal levels of other serum enzymes: Secondary | ICD-10-CM | POA: Diagnosis not present

## 2014-10-04 DIAGNOSIS — R7989 Other specified abnormal findings of blood chemistry: Secondary | ICD-10-CM

## 2014-10-04 NOTE — Progress Notes (Signed)
BMP DONE TODAY Katie Moch 

## 2014-10-05 LAB — BASIC METABOLIC PANEL
BUN: 32 mg/dL — ABNORMAL HIGH (ref 6–23)
CHLORIDE: 105 meq/L (ref 96–112)
CO2: 16 mEq/L — ABNORMAL LOW (ref 19–32)
CREATININE: 2.74 mg/dL — AB (ref 0.50–1.10)
Calcium: 8.7 mg/dL (ref 8.4–10.5)
GLUCOSE: 85 mg/dL (ref 70–99)
Potassium: 4.6 mEq/L (ref 3.5–5.3)
Sodium: 138 mEq/L (ref 135–145)

## 2014-10-07 ENCOUNTER — Other Ambulatory Visit: Payer: Self-pay | Admitting: Family Medicine

## 2014-10-07 DIAGNOSIS — R7989 Other specified abnormal findings of blood chemistry: Secondary | ICD-10-CM

## 2014-10-07 NOTE — Assessment & Plan Note (Signed)
Not improving. -Refer to renal.

## 2014-10-07 NOTE — Telephone Encounter (Signed)
Attempted to call again.  Have placed a referral to renal for elevated Cr.  Not improving.  Would like patient to see renal as soon as able.  Annise Boran M. Lajuana Ripple, DO PGY-1, [place]

## 2014-10-23 ENCOUNTER — Ambulatory Visit (INDEPENDENT_AMBULATORY_CARE_PROVIDER_SITE_OTHER): Payer: Medicare Other | Admitting: Family Medicine

## 2014-10-23 ENCOUNTER — Encounter: Payer: Self-pay | Admitting: Family Medicine

## 2014-10-23 VITALS — BP 137/89 | HR 100 | Temp 98.4°F | Wt 228.1 lb

## 2014-10-23 DIAGNOSIS — R300 Dysuria: Secondary | ICD-10-CM | POA: Diagnosis not present

## 2014-10-23 DIAGNOSIS — R748 Abnormal levels of other serum enzymes: Secondary | ICD-10-CM

## 2014-10-23 DIAGNOSIS — R7989 Other specified abnormal findings of blood chemistry: Secondary | ICD-10-CM

## 2014-10-23 LAB — POCT URINALYSIS DIPSTICK
BILIRUBIN UA: NEGATIVE
GLUCOSE UA: NEGATIVE
KETONES UA: NEGATIVE
Nitrite, UA: NEGATIVE
Protein, UA: >=300
RBC UA: NEGATIVE
Spec Grav, UA: 1.02
Urobilinogen, UA: 0.2
pH, UA: 6

## 2014-10-23 LAB — POCT UA - MICROSCOPIC ONLY

## 2014-10-23 NOTE — Assessment & Plan Note (Addendum)
Medications reviewed with Dr Valentina Lucks, PharmD, to evaluate for any nephrotoxic agents.  No offending medications.  >300 protein in urine.  This seems to be chronic after reviewing previous urinalysis.  Previously discussed with Dr Nori Riis, who recommended that patient see nephrology if continued elevation in Cr.  Referral has been placed.  Earliest appointment for evaluation is 2 months from now.  Do not suspect diabetic nephropathy at this time as patient has normal fasting glucose.  DDx: minimal change disease (No recent upper respiratory illness that they can recall.  No edema but has had some weight gain and has low albumin.  No hematuria.  No recent lipid panel.  Could consider getting this at next visit), focal segmental glomerulosclerosis (patient has HTN, proteinuria, low albumin and is obese but has no edema on exam),  membranous nephropathy.   -Obtain Urine Protein:Cr and Urine Na today. -Repeat BMET today -Encouraged PO hydration -Avoid nephrotoxic medications -Will obtain Lipid panel at next visit.  Though would need biopsy to determine if MCD. -See nephrology as soon as able.   -Return precautions were reviewed at length with patient and mother -Patient to return in 66 month or sooner

## 2014-10-23 NOTE — Patient Instructions (Addendum)
It was a pleasure seeing you today, Vanessa Alvarez!  Information regarding what we discussed is included in this packet.  Please make an appointment to see me in 1 month or sooner if needed.  [facility] will call you in the next 2 months with an appointment to be seen by the nephrologist.  In the meantime you should AVOID taking Aleve (Naproxen), Motrin (Ibuprofen).  Tylenol is ok for pain.  Please feel free to call our office at [phone] if any questions or concerns arise.  Warm Regards, [name], DO

## 2014-10-23 NOTE — Progress Notes (Signed)
Patient ID: Vanessa Alvarez, female   DOB: 1986/01/31, 29 y.o.   MRN: [id number]    Subjective: CC: elevated Cr HPI: Patient is a 29 y.o. female presenting to clinic today for office visit. Concerns today include:  Elevated Cr Mother reports that patient sometimes takes Aleve but has not been taking any NSAIDs in over a month.  Patient reports that she has been drinking 1 big bottle of water daily.  Mother reports that patient has been having urinary incontinence at night for several months.  She dreams that she is using the bathroom and wakes up to find herself wet.  Does not have incontinence during the day.  Pain with urination a couple of days ago.  No fevers or vomiting.  Is nauseated intermittently for several months.  No difficulty urinating.  No fecal incontinence.  Reports frothy urine.  No foul odors, itching or vaginal pain.  Social History Reviewed: non smoker. FamHx and MedHx updated.  Please see EMR. Health Maintenance: declines pap  ROS: All other systems reviewed and are negative.  Objective: Office vital signs reviewed. BP 137/89 mmHg  Pulse 100  Temp(Src) 98.4 F (36.9 C)  Wt 228 lb 1.6 oz (103.465 kg)  LMP 09/13/2014 (Approximate)  Physical Examination:  General: Awake, alert, obese female, NAD HEENT: Normal, MMM Cardio: RRR, S1S2 heard, no murmurs appreciated Pulm: CTAB, no wheezes, rhonchi or rales Extremities: WWP, No edema, cyanosis or clubbing; +2 pulses bilaterally MSK: Normal gait and station  Results for orders placed or performed in visit on 10/23/14 (from the past 24 hour(s))  POCT urinalysis dipstick     Status: Abnormal   Collection Time: 10/23/14  4:06 PM  Result Value Ref Range   Color, UA YELLOW    Clarity, UA CLEAR    Glucose, UA NEG    Bilirubin, UA NEG    Ketones, UA NEG    Spec Grav, UA 1.020    Blood, UA NEG    pH, UA 6.0    Protein, UA >=300    Urobilinogen, UA 0.2    Nitrite, UA NEG    Leukocytes, UA Trace (A) Negative  POCT UA -  Microscopic Only     Status: None   Collection Time: 10/23/14  4:06 PM  Result Value Ref Range   WBC, Ur, HPF, POC RARE    RBC, urine, microscopic NONE    Bacteria, U Microscopic RARE    Epithelial cells, urine per micros OCCASIONAL    Assessment: 29 y.o. female with elevated Cr and proteinuria  Plan: See Problem List and After Visit Summary   [name], DO PGY-1, [place]

## 2014-10-24 ENCOUNTER — Other Ambulatory Visit: Payer: Self-pay | Admitting: Family Medicine

## 2014-10-24 DIAGNOSIS — R7989 Other specified abnormal findings of blood chemistry: Secondary | ICD-10-CM

## 2014-10-24 LAB — BASIC METABOLIC PANEL
BUN: 27 mg/dL — ABNORMAL HIGH (ref 6–23)
CO2: 18 mEq/L — ABNORMAL LOW (ref 19–32)
CREATININE: 2.66 mg/dL — AB (ref 0.50–1.10)
Calcium: 8.7 mg/dL (ref 8.4–10.5)
Chloride: 107 mEq/L (ref 96–112)
GLUCOSE: 99 mg/dL (ref 70–99)
POTASSIUM: 4.7 meq/L (ref 3.5–5.3)
Sodium: 139 mEq/L (ref 135–145)

## 2014-10-24 LAB — PROTEIN / CREATININE RATIO, URINE
CREATININE, URINE: 108.5 mg/dL
Protein Creatinine Ratio: 1.29 — ABNORMAL HIGH (ref <0.15)
TOTAL PROTEIN, URINE: 140 mg/dL — AB (ref 5–24)

## 2014-10-24 LAB — SODIUM, URINE, RANDOM: SODIUM UR: 84 mmol/L (ref 28–272)

## 2014-10-24 NOTE — Progress Notes (Signed)
Called patient and LVM (mother said this would be ok if no answer).  Patient to pick up jug for 24 hour urine protein.  Discussed with Ms Cherlyn Roberts, who will have one ready for her.  Patient to return tomorrow.  Ashly M. Lajuana Ripple, DO PGY-1, [place]

## 2014-10-25 NOTE — Progress Notes (Signed)
I was the preceptor on the day of this visit.   Kyle Fletke MD  

## 2014-10-29 ENCOUNTER — Telehealth: Payer: Self-pay | Admitting: *Deleted

## 2014-10-29 DIAGNOSIS — R748 Abnormal levels of other serum enzymes: Secondary | ICD-10-CM | POA: Diagnosis not present

## 2014-10-29 NOTE — Telephone Encounter (Signed)
-----   Message from [name], DO sent at 10/29/2014  1:53 PM EDT ----- Regarding: 24 hour urine protein Please call patient to verify that she has picked up the 24 hour urine protein jug.  Called and no answer.  Have left a voicemail but patient tends not to check VM regularly.  Thank you.

## 2014-10-29 NOTE — Telephone Encounter (Signed)
Contacted pt and she informed me that it was dropped off this morning. Katharina Caper, Alexande Sheerin D, [place]

## 2014-10-30 LAB — PROTEIN, URINE, 24 HOUR
PROTEIN 24H UR: 1162 mg/d — AB (ref <150)
PROTEIN, URINE: 83 mg/dL — AB (ref 5–24)

## 2014-10-31 ENCOUNTER — Encounter: Payer: Self-pay | Admitting: Family Medicine

## 2014-10-31 NOTE — Progress Notes (Signed)
Patient ID: Vanessa Alvarez, female   DOB: 11/16/1985, 29 y.o.   MRN: [id number] [facility] to inform of new labs.  Concern for nephrotic syndrome.  Would like patient to be seen earlier if possible.  Asked referral coordinator, Suanne Marker, to please have provider inform me of any additional labs/studies we can obtain in preparation for patient's appointment.  Tia to fax over most recent BMP and 24 hour urine protein.  Per Suanne Marker, patient's chart to be re-assessed for possible sooner appointment.  Appreciate renal's assistance.   Chloe Flis M. Lajuana Ripple, DO PGY-2, [place]

## 2014-11-27 DIAGNOSIS — I129 Hypertensive chronic kidney disease with stage 1 through stage 4 chronic kidney disease, or unspecified chronic kidney disease: Secondary | ICD-10-CM | POA: Diagnosis not present

## 2014-11-27 DIAGNOSIS — D631 Anemia in chronic kidney disease: Secondary | ICD-10-CM | POA: Diagnosis not present

## 2014-11-27 DIAGNOSIS — E785 Hyperlipidemia, unspecified: Secondary | ICD-10-CM | POA: Diagnosis not present

## 2014-11-27 DIAGNOSIS — R7989 Other specified abnormal findings of blood chemistry: Secondary | ICD-10-CM | POA: Diagnosis not present

## 2014-11-27 DIAGNOSIS — N049 Nephrotic syndrome with unspecified morphologic changes: Secondary | ICD-10-CM | POA: Diagnosis not present

## 2014-12-03 ENCOUNTER — Other Ambulatory Visit (HOSPITAL_COMMUNITY): Payer: Self-pay | Admitting: Nephrology

## 2014-12-03 ENCOUNTER — Other Ambulatory Visit: Payer: Self-pay | Admitting: Nephrology

## 2014-12-03 ENCOUNTER — Ambulatory Visit
Admission: RE | Admit: 2014-12-03 | Discharge: 2014-12-03 | Disposition: A | Discharge status: Home / Self Care | Payer: Medicare Other | Source: Ambulatory Visit | Attending: Nephrology | Admitting: Nephrology

## 2014-12-03 DIAGNOSIS — N183 Chronic kidney disease, stage 3 unspecified: Secondary | ICD-10-CM

## 2014-12-03 DIAGNOSIS — R809 Proteinuria, unspecified: Secondary | ICD-10-CM

## 2014-12-10 ENCOUNTER — Other Ambulatory Visit: Payer: Self-pay | Admitting: Radiology

## 2014-12-10 DIAGNOSIS — F42 Obsessive-compulsive disorder: Secondary | ICD-10-CM | POA: Diagnosis not present

## 2014-12-10 DIAGNOSIS — F71 Moderate intellectual disabilities: Secondary | ICD-10-CM | POA: Diagnosis not present

## 2014-12-11 ENCOUNTER — Ambulatory Visit (HOSPITAL_COMMUNITY)
Admission: RE | Admit: 2014-12-11 | Discharge: 2014-12-11 | Disposition: A | Discharge status: Home / Self Care | Payer: Medicare Other | Source: Ambulatory Visit | Attending: Nephrology | Admitting: Nephrology

## 2014-12-11 ENCOUNTER — Other Ambulatory Visit (HOSPITAL_COMMUNITY): Payer: Self-pay | Admitting: Nephrology

## 2014-12-11 ENCOUNTER — Encounter (HOSPITAL_COMMUNITY): Payer: Self-pay

## 2014-12-11 DIAGNOSIS — N183 Chronic kidney disease, stage 3 unspecified: Secondary | ICD-10-CM

## 2014-12-11 DIAGNOSIS — I129 Hypertensive chronic kidney disease with stage 1 through stage 4 chronic kidney disease, or unspecified chronic kidney disease: Secondary | ICD-10-CM | POA: Insufficient documentation

## 2014-12-11 DIAGNOSIS — N189 Chronic kidney disease, unspecified: Secondary | ICD-10-CM | POA: Diagnosis not present

## 2014-12-11 DIAGNOSIS — R809 Proteinuria, unspecified: Secondary | ICD-10-CM

## 2014-12-11 DIAGNOSIS — Z79899 Other long term (current) drug therapy: Secondary | ICD-10-CM | POA: Insufficient documentation

## 2014-12-11 DIAGNOSIS — Z8249 Family history of ischemic heart disease and other diseases of the circulatory system: Secondary | ICD-10-CM | POA: Insufficient documentation

## 2014-12-11 DIAGNOSIS — N186 End stage renal disease: Secondary | ICD-10-CM | POA: Insufficient documentation

## 2014-12-11 DIAGNOSIS — E039 Hypothyroidism, unspecified: Secondary | ICD-10-CM | POA: Diagnosis not present

## 2014-12-11 DIAGNOSIS — F840 Autistic disorder: Secondary | ICD-10-CM | POA: Diagnosis not present

## 2014-12-11 DIAGNOSIS — Z992 Dependence on renal dialysis: Secondary | ICD-10-CM

## 2014-12-11 DIAGNOSIS — F79 Unspecified intellectual disabilities: Secondary | ICD-10-CM | POA: Diagnosis not present

## 2014-12-11 LAB — APTT: APTT: 34 s (ref 24–37)

## 2014-12-11 LAB — CBC
HCT: 32.6 % — ABNORMAL LOW (ref 36.0–46.0)
Hemoglobin: 10.3 g/dL — ABNORMAL LOW (ref 12.0–15.0)
MCH: 24.7 pg — ABNORMAL LOW (ref 26.0–34.0)
MCHC: 31.6 g/dL (ref 30.0–36.0)
MCV: 78.2 fL (ref 78.0–100.0)
PLATELETS: 240 10*3/uL (ref 150–400)
RBC: 4.17 MIL/uL (ref 3.87–5.11)
RDW: 17.7 % — ABNORMAL HIGH (ref 11.5–15.5)
WBC: 7.3 10*3/uL (ref 4.0–10.5)

## 2014-12-11 LAB — PROTIME-INR
INR: 1.11 (ref 0.00–1.49)
PROTHROMBIN TIME: 14.5 s (ref 11.6–15.2)

## 2014-12-11 MED ORDER — SODIUM CHLORIDE 0.9 % IV SOLN
INTRAVENOUS | Status: AC | PRN
  Administered 2014-12-11: 10 mL/h via INTRAVENOUS

## 2014-12-11 MED ORDER — MIDAZOLAM HCL 2 MG/2ML IJ SOLN
INTRAMUSCULAR | Status: AC | PRN
  Administered 2014-12-11: 1 mg via INTRAVENOUS

## 2014-12-11 MED ORDER — GELATIN ABSORBABLE 12-7 MM EX MISC
CUTANEOUS | Status: AC
  Filled 2014-12-11: qty 1

## 2014-12-11 MED ORDER — MIDAZOLAM HCL 2 MG/2ML IJ SOLN
INTRAMUSCULAR | Status: AC
  Filled 2014-12-11: qty 2

## 2014-12-11 MED ORDER — LIDOCAINE HCL 1 % IJ SOLN
INTRAMUSCULAR | Status: AC
  Filled 2014-12-11: qty 20

## 2014-12-11 MED ORDER — SODIUM CHLORIDE 0.9 % IV SOLN: Freq: Once | INTRAVENOUS | Status: DC

## 2014-12-11 MED ORDER — LIDOCAINE HCL (PF) 1 % IJ SOLN
INTRAMUSCULAR | Status: AC
  Filled 2014-12-11: qty 10

## 2014-12-11 MED ORDER — FENTANYL CITRATE (PF) 100 MCG/2ML IJ SOLN
INTRAMUSCULAR | Status: AC
  Filled 2014-12-11: qty 2

## 2014-12-11 MED ORDER — FENTANYL CITRATE (PF) 100 MCG/2ML IJ SOLN
INTRAMUSCULAR | Status: AC | PRN
  Administered 2014-12-11: 50 ug via INTRAVENOUS

## 2014-12-11 NOTE — Discharge Instructions (Signed)
Kidney Biopsy, Care After Refer to this sheet in the next few weeks. These instructions provide you with information on caring for yourself after your procedure. Your health care provider may also give you more specific instructions. Your treatment has been planned according to current medical practices, but problems sometimes occur. Call your health care provider if you have any problems or questions after your procedure.  WHAT TO EXPECT AFTER THE PROCEDURE   You may notice blood in the urine for the first 24 hours after the biopsy.  You may feel some pain at the biopsy site for 1-2 weeks after the biopsy. HOME CARE INSTRUCTIONS  Do not lift anything heavier than 10 lb (4.5 kg) for 2 weeks.  Do not take any non-steroidal anti-inflammatory drugs (NSAIDs) or any blood thinners for a week after the biopsy unless instructed to do so by your health care provider.  Only take medicines for pain, fever, or discomfort as directed by your health care provider. SEEK MEDICAL CARE IF:  You have bloody urine more than 24 hours after the biopsy.   You develop a fever.   You cannot urinate.   You have increasing pain at the biopsy site.  SEEK IMMEDIATE MEDICAL CARE IF: You feel faint or dizzy.  Document Released: 12/13/2012 Document Reviewed: 12/13/2012 [facility] Patient Information 2015 [place]. This information is not intended to replace advice given to you by your health care provider. Make sure you discuss any questions you have with your health care provider.

## 2014-12-11 NOTE — Procedures (Signed)
Interventional Radiology Procedure Note  Procedure:  CT guided bx of left kidney for medical renal dz. 4 x 18G core.  Complications: None Recommendations:  - Ok to shower tomorrow - Do not submerge for 7 days - Routine care  - observe for 2-3 hours  Signed,  Dulcy Fanny. Earleen Newport, DO

## 2014-12-11 NOTE — Sedation Documentation (Signed)
Will let her mom know.

## 2014-12-11 NOTE — H&P (Signed)
Chief Complaint: Patient was seen in consultation today for proteinuria at the request of Webb,Martin  Referring Physician(s): Webb,Martin  History of Present Illness: Vanessa Alvarez is a 29 y.o. female   Noted change in renal functions by primary MD in 09/2014 Serial studies show continued rise Also with proteinuria Referred to Dr Justin Mend for evaluation Work up includes labs and Renal US Chronic renal insufficiency Request for random renal biopsy I have seen and examined pt  Past Medical History  Diagnosis Date  . Hypertension   . Borderline mental retardation   . Autism spectrum   . Mood disorder     Sees Dr Silvio Pate, who prescribes meds  . Anemia     Iron Def  . 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 Hypothyroidism   . HYPOTHYROIDISM 03/12/2009    Qualifier: Diagnosis of  By: Earley Favor MD, Cat      Past Surgical History  Procedure Laterality Date  . Cholecystectomy  11/2004    Allergies: Review of patient's allergies indicates no known allergies.  Medications: Prior to Admission medications   Medication Sig Start Date End Date Taking? Authorizing Provider  divalproex (DEPAKOTE) 500 MG EC tablet 2 tabs by mouth daily at bedtime    Yes Historical Provider, MD  ferrous sulfate 325 (65 FE) MG tablet Take 1 tablet (325 mg total) by mouth 2 (two) times daily. 01/07/12  Yes Clovis Cao, MD  LATUDA 40 MG TABS tablet TAKE 1 TABLET DAILY WITH THE EVENING MEAL**PA REQ** 09/28/14  Yes Historical Provider, MD  levothyroxine (SYNTHROID, LEVOTHROID) 112 MCG tablet TAKE 1 TABLET DAILY BEFORE BREAKFAST. 09/25/14  Yes Ashly M Gottschalk, DO  ondansetron (ZOFRAN) 4 MG tablet Take 1 tablet (4 mg total) by mouth every 8 (eight) hours as needed for nausea or vomiting. 10/11/13  Yes Sharon [address], MD  propranolol (INDERAL) 60 MG tablet Take 1 tablet (60 mg total) by mouth 2 (two) times daily. 09/25/14  Yes Ashly M Gottschalk, DO  ranitidine (ZANTAC)  150 MG tablet TAKE 1 TABLET BY MOUTH 2 TIMES DAILY. 09/25/14  Yes Ashly M Gottschalk, DO  sertraline (ZOLOFT) 100 MG tablet 1 and 1/2 tabs by mouth daily    Yes Historical Provider, MD  hydrocortisone 2.5 % ointment APPLY TOPICALLY 2 (TWO) TIMES DAILY. TO POSTERIOR ARMS AND THIGH X 7-10 DAYS Patient not taking: Reported on 12/10/2014 06/12/12   Clovis Cao, MD     Family History  Problem Relation Age of Onset  . Hypertension Mother   . Diabetes Maternal Grandmother   . Hypertension Maternal Grandmother   . Heart disease Maternal Grandmother   . Cancer Maternal Grandmother   . Diabetes Maternal Grandfather     Social History   Social History  . Marital Status: Single    Spouse Name: N/A  . Number of Children: N/A  . Years of Education: N/A   Social History Main Topics  . Smoking status: Never Smoker   . Smokeless tobacco: None  . Alcohol Use: No  . Drug Use: No  . Sexual Activity: No   Other Topics Concern  . None   Social History Narrative   Lives with mother.   Very sedentary livestyle.   No etoh, tobacco, drugs, intercourse.      MR.      Review of Systems: A 12 point ROS discussed and pertinent positives are indicated in the HPI above.  All other  systems are negative.  Review of Systems  Constitutional: Positive for fatigue. Negative for activity change.  Respiratory: Negative for shortness of breath.   Cardiovascular: Negative for chest pain.  Neurological: Negative for weakness.  Psychiatric/Behavioral: Negative for behavioral problems.       Pt is mildly mentally retarded    Vital Signs: BP 154/91 mmHg  Pulse 74  Temp(Src) 98.4 F (36.9 C)  Resp 20  Ht 5\' 5"  (1.651 m)  Wt 221 lb (100.245 kg)  BMI 36.78 kg/m2  SpO2 100%  Physical Exam  Constitutional: She is oriented to person, place, and time.  Cardiovascular: Normal rate, regular rhythm and normal heart sounds.   No murmur heard. Pulmonary/Chest: Effort normal and breath sounds normal. She has  no wheezes.  Abdominal: Soft. Bowel sounds are normal. There is no tenderness.  Musculoskeletal: Normal range of motion.  Neurological: She is alert and oriented to person, place, and time.  Skin: Skin is warm and dry.  Psychiatric: She has a normal mood and affect. Her behavior is normal. Judgment and thought content normal.  Nursing note and vitals reviewed.   Mallampati Score:  MD Evaluation Airway: WNL Heart: WNL Abdomen: WNL Chest/ Lungs: WNL ASA  Classification: 3, 2 Mallampati/Airway Score: One  Imaging: US Renal  12/03/2014   CLINICAL DATA:  Chronic renal insufficiency stage III, proteinuria.  EXAM: RENAL / URINARY TRACT ULTRASOUND COMPLETE  COMPARISON:  Abdominal pelvic CT scan of Sep 09, 2005  FINDINGS: Right Kidney:  Length: 10.9 cm. The renal cortical echotexture is increased as compared to the adjacent liver. There is no focal mass nor hydronephrosis.  Left Kidney:  Length: 10.5 cm. The renal cortical echotexture is mildly increased diffusely. There is no focal mass nor hydronephrosis.  Bladder:  The urinary bladder is only partially distended. Ureteral jets were not observed.  IMPRESSION: Increased renal cortical echotexture consistent with medical renal disease. There is no hydronephrosis.   Electronically Signed   By: David  Martinique M.D.   On: 12/03/2014 13:16    Labs:  CBC:  Recent Labs  09/30/14 0846 12/11/14 0834  WBC 7.6 7.3  HGB 9.7* 10.3*  HCT 30.5* 32.6*  PLT 253 240    COAGS:  Recent Labs  12/11/14 0834  INR 1.11  APTT 34    BMP:  Recent Labs  09/30/14 0846 10/04/14 1548 10/23/14 1601  NA 138 138 139  K 4.5 4.6 4.7  CL 107 105 107  CO2 19 16* 18*  GLUCOSE 92 85 99  BUN 21 32* 27*  CALCIUM 8.7 8.7 8.7  CREATININE 2.33* 2.74* 2.66*  GFRNONAA 27*  --   --   GFRAA 32*  --   --     LIVER FUNCTION TESTS:  Recent Labs  09/30/14 0846  BILITOT 0.2  AST 11  ALT <8  ALKPHOS 63  PROT 6.8  ALBUMIN 3.3*    TUMOR MARKERS: No  results for input(s): AFPTM, CEA, CA199, CHROMGRNA in the last 8760 hours.  Assessment and Plan:  CRI Proteinuria Random renal biopsy requested per Dr Justin Mend Risks and Benefits discussed with the patient including, but not limited to bleeding, infection, damage to adjacent structures or low yield requiring additional tests. All of the patient's questions were answered, patient is agreeable to proceed. Consent signed and in chart. Pts mother also aware of procedure risks and benefits and agreeable to proceed  Thank you for this interesting consult.  I greatly enjoyed meeting Vanessa Alvarez and look forward to participating  in their care.  A copy of this report was sent to the requesting provider on this date.  Signed: Santino Kinsella A 12/11/2014, 9:33 AM   I spent a total of  30 Minutes   in face to face in clinical consultation, greater than 50% of which was counseling/coordinating care for random renal bx

## 2014-12-11 NOTE — Sedation Documentation (Signed)
Procedure switched to CT as per Dr Earleen Newport. Unable to visualize well by ultrasound/

## 2014-12-11 NOTE — Sedation Documentation (Signed)
Patient and mom were kept updated re delay in procedure.

## 2014-12-20 ENCOUNTER — Encounter (HOSPITAL_COMMUNITY): Payer: Self-pay

## 2014-12-31 ENCOUNTER — Encounter (HOSPITAL_COMMUNITY): Payer: Self-pay

## 2015-01-08 DIAGNOSIS — R7989 Other specified abnormal findings of blood chemistry: Secondary | ICD-10-CM | POA: Diagnosis not present

## 2015-01-08 DIAGNOSIS — D631 Anemia in chronic kidney disease: Secondary | ICD-10-CM | POA: Diagnosis not present

## 2015-01-08 DIAGNOSIS — N049 Nephrotic syndrome with unspecified morphologic changes: Secondary | ICD-10-CM | POA: Diagnosis not present

## 2015-01-08 DIAGNOSIS — N051 Unspecified nephritic syndrome with focal and segmental glomerular lesions: Secondary | ICD-10-CM | POA: Diagnosis not present

## 2015-01-08 DIAGNOSIS — I129 Hypertensive chronic kidney disease with stage 1 through stage 4 chronic kidney disease, or unspecified chronic kidney disease: Secondary | ICD-10-CM | POA: Diagnosis not present

## 2015-01-21 ENCOUNTER — Encounter: Payer: Self-pay | Admitting: Family Medicine

## 2015-01-21 DIAGNOSIS — N051 Unspecified nephritic syndrome with focal and segmental glomerular lesions: Secondary | ICD-10-CM | POA: Insufficient documentation

## 2015-01-27 ENCOUNTER — Ambulatory Visit (INDEPENDENT_AMBULATORY_CARE_PROVIDER_SITE_OTHER): Payer: Medicare Other | Admitting: Family Medicine

## 2015-01-27 ENCOUNTER — Encounter: Payer: Self-pay | Admitting: Family Medicine

## 2015-01-27 VITALS — BP 111/73 | HR 72 | Temp 98.7°F | Ht 65.0 in | Wt 216.0 lb

## 2015-01-27 DIAGNOSIS — G44209 Tension-type headache, unspecified, not intractable: Secondary | ICD-10-CM

## 2015-01-27 DIAGNOSIS — N183 Chronic kidney disease, stage 3 unspecified: Secondary | ICD-10-CM

## 2015-01-27 DIAGNOSIS — R112 Nausea with vomiting, unspecified: Secondary | ICD-10-CM

## 2015-01-27 DIAGNOSIS — K219 Gastro-esophageal reflux disease without esophagitis: Secondary | ICD-10-CM | POA: Diagnosis not present

## 2015-01-27 MED ORDER — OMEPRAZOLE 20 MG PO CPDR
20.0000 mg | DELAYED_RELEASE_CAPSULE | Freq: Every day | ORAL | Status: DC
Start: 2015-01-27 — End: 2015-06-08

## 2015-01-27 NOTE — Progress Notes (Signed)
Subjective: CC: nausea/vomiting/headache HPI: Patient is a 29 y.o. female presenting to clinic today for office visit. Concerns today include:  1. Nausea/vomiting Patient reports that symptoms began 1 month ago.  She has been able to drink normally.  She has been able to keep solid foods down.  She has been cleaning her plate.  Has vomited maybe twice over the last month.  She denies hematemesis, sick contacts, fevers, chills, travel.  Maybe some diarrhea but no blood in stool.  She points to the epigastrium at the site of discomfort.  She notes that discomfort sometimes feels like chest burning/pain.  2. Headache Patient reports that headache has been present over the last few months.  Last headache was last week.  Headache is described as throbbing and occurs in a bandlike pattern across her forehead.  It is currently a 0/10.  She has taken Tylenol with good relief.  Otherwise, headache does not resolve.  Broke glasses a couple of weeks ago.  Denies vision changes, gait abnormalities, dizziness, CP, SOB.  Occasionally feels nauseated with headaches.  Sleeping does not resolve headache.  Denies photophobia or phonophobia.  3. CKD III Patient has had renal bx performed, was told that she had scarring on her kidney.  Was told that likely 2/2 HTN, glucose issues.  Mother upset because she does not feel that diagnosis was discussed fully at nephrologist office. Patient limiting intake of fluids 2/2 renal recommendations.  Avoiding NSAIDs.  Follow up scheduled.  Endorses some discomfort a bx site.  No dysuria, hematuria, fevers, urinary urgency.  Social History Reviewed: non smoker. FamHx and MedHx updated.  Please see EMR. Health Maintenance: Flu shot due  ROS: All other systems reviewed and are negative.  Objective: Office vital signs reviewed. BP 111/73 mmHg  Pulse 72  Temp(Src) 98.7 F (37.1 C) (Oral)  Ht 5\' 5"  (1.651 m)  Wt 216 lb (97.977 kg)  BMI 35.94 kg/m2  LMP  01/19/2015  Physical Examination:  General: Awake, alert, obese, NAD HEENT: MMM, o/p clear Cardio: RRR, S1S2 heard, no murmurs appreciated Pulm: CTAB, no wheezes, rhonchi or rales, normal WOB GI: soft, NT/ND,+BS x4, no palpable masses, no peritoneal signs, no guarding or rebound GU: no CVA TTP, no suprapubic TTP Extremities: WWP, No edema, cyanosis or clubbing; +2 distal pulses bilaterally  Assessment/ Plan: 29 y.o. female with  1. Non-intractable vomiting with nausea, unspecified vomiting type.  Suspect related to GERD.  Patient was on PPI previously.  We were trying her off PPI.  She initially tolerated this well.  However, was recently placed on Norvasc.  Symptoms appear to have presented themselves with this medication.  In the setting of intermittent diarrhea, will r/o possible constipation causing nausea.  H/o cholecystecomy.  NO red flags on exam. - DG Abd 2 Views; Future - Return precautions reviewed - Patient to follow up in 2 weeks if no improvement.  Will consider stool studies/additional blood work/?CTabd/ referral to GI  2. Gastroesophageal reflux disease without esophagitis - omeprazole (PRILOSEC) 20 MG capsule; Take 1 capsule (20 mg total) by mouth daily.  Dispense: 30 capsule; Refill: 3 - Continue Zantac  3. Chronic kidney disease, stage III (moderate).  Last Cr 2.89 at [facility] in 11/2014.  Mother expressing concerns, as she does not feel that child's prognosis/management has been fully explained to her.  She is asking to potentially get a second opinion. - Discussed with mother that she should ask Nephrologist to please review child's case with her and  explain management.  Mother seems to need additional reassurance/ guidance as to the child's prognosis. - follow up with Dr Justin Mend as scheduled.  4. Tension-type headache, not intractable, unspecified chronicity pattern.  No focal neuro deficits on exam.  No red flags.  Does not sound like migraine headaches.  -  Continue Tylenol PRN headache - Avoid NSAIDs in the setting of CKD - Return precautions reviewed.  [name], DO PGY-2, [place]

## 2015-01-27 NOTE — Patient Instructions (Signed)
Your symptoms are likely related to GERD.  I have sent in a prescription for you to start Omeprazole daily.  You can keep taking your Ranitidine too.  Sometimes Norvasc (Amlodipine) can cause acid reflux to be worse.  I have also ordered an xray of your abdomen.  I will contact you with the results of this.  If you continue to have diarrhea, you develop fevers, worsening vomiting, or are so nauseated that you cannot keep fluids down, I want you to seek immediate medical attention.  Plan to follow up with Dr Justin Mend.  Ask him to explain your prognosis and his plan for you.  I'm sure that he would be very willing to do this for you.  Follow up with me in 2 weeks if your symptoms are not getting better.  Food Choices for Gastroesophageal Reflux Disease When you have gastroesophageal reflux disease (GERD), the foods you eat and your eating habits are very important. Choosing the right foods can help ease the discomfort of GERD. WHAT GENERAL GUIDELINES DO I NEED TO FOLLOW?  Choose fruits, vegetables, whole grains, low-fat dairy products, and low-fat meat, fish, and poultry.  Limit fats such as oils, salad dressings, butter, nuts, and avocado.  Keep a food diary to identify foods that cause symptoms.  Avoid foods that cause reflux. These may be different for different people.  Eat frequent small meals instead of three large meals each day.  Eat your meals slowly, in a relaxed setting.  Limit fried foods.  Cook foods using methods other than frying.  Avoid drinking alcohol.  Avoid drinking large amounts of liquids with your meals.  Avoid bending over or lying down until 2-3 hours after eating. WHAT FOODS ARE NOT RECOMMENDED? The following are some foods and drinks that may worsen your symptoms: Vegetables Tomatoes. Tomato juice. Tomato and spaghetti sauce. Chili peppers. Onion and garlic. Horseradish. Fruits Oranges, grapefruit, and lemon (fruit and juice). Meats High-fat meats, fish, and  poultry. This includes hot dogs, ribs, ham, sausage, salami, and bacon. Dairy Whole milk and chocolate milk. Sour cream. Cream. Butter. Ice cream. Cream cheese.  Beverages Coffee and tea, with or without caffeine. Carbonated beverages or energy drinks. Condiments Hot sauce. Barbecue sauce.  Sweets/Desserts Chocolate and cocoa. Donuts. Peppermint and spearmint. Fats and Oils High-fat foods, including [place] fries and potato chips. Other Vinegar. Strong spices, such as black pepper, white pepper, red pepper, cayenne, curry powder, cloves, ginger, and chili powder. The items listed above may not be a complete list of foods and beverages to avoid. Contact your dietitian for more information. Document Released: 04/12/2005 Document Revised: 04/17/2013 Document Reviewed: 02/14/2013 [facility] Patient Information 2015 [place], [place]. This information is not intended to replace advice given to you by your health care provider. Make sure you discuss any questions you have with your health care provider.

## 2015-02-11 ENCOUNTER — Encounter: Payer: Self-pay | Admitting: Family Medicine

## 2015-02-11 ENCOUNTER — Ambulatory Visit (INDEPENDENT_AMBULATORY_CARE_PROVIDER_SITE_OTHER): Payer: Medicare Other | Admitting: Family Medicine

## 2015-02-11 VITALS — BP 125/76 | HR 73 | Temp 99.0°F | Wt 208.0 lb

## 2015-02-11 DIAGNOSIS — R112 Nausea with vomiting, unspecified: Secondary | ICD-10-CM

## 2015-02-11 MED ORDER — PROMETHAZINE HCL 25 MG PO TABS: 25.0000 mg | ORAL_TABLET | Freq: Three times a day (TID) | ORAL | Status: DC | PRN

## 2015-02-11 NOTE — Patient Instructions (Signed)
I have prescribed phenergan to assist with nausea and vomiting. Try to take in fluids such as Gatorade, I don't want her kidneys to be injured by dehydration. Please get the imaging that was ordered by your PCP. Follow up with [place] in 2 days as I want to make sure things are improving instead of declining.

## 2015-02-11 NOTE — Progress Notes (Signed)
Patient ID: Vanessa Alvarez, female   DOB: [birth year], 29 y.o.   MRN: [id number]    Subjective: [id number] HPI: Patient is a 29 y.o. female with a past medical history of FSGS, HTN, mental retardation presenting to clinic today for a same day appt for nausea, vomiting, and diarrhea.  Patient has had nausea for several months. Vomiting started approximately 1 month ago. She went to see her PCP, however unfortunately never had the abdominal x-ray what was ordered on 10/3. The emesis looks like food or yellow material. Last Friday she developed diarrhea that is frequent and watery and brown. No red or black stools. Patient has intermittent abdominal pain superior to the umbilicus. During her last visit she had chest pain/burning has improved after being started on the Prilosec. She tried pepto bismuth yesterday without improvement.  She last ate yesterday night (mom does not think she vomited with this). She drank soda today before she came here and it was thought that she kept it down.  She last vomited 1-2 hours ago.   She has not been around any sick contacts. She is s/p cholecystectomy. She has been on Depakote for quite sometime per her mother, she was recently started on Latuda by her physician at [facility] a few months ago. They both adamantly claim that there is no way she could be pregnant as she is always with her mother and has never been sexually active.    Social History: never smoker   ROS: All other systems reviewed and are negative.  Past Medical History Patient Active Problem List   Diagnosis Date Noted  . Nausea with vomiting 02/12/2015  . 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 Chronic kidney disease, stage III (moderate)   . Nystagmus 08/22/2013  . 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 Epigastric discomfort 01/07/2012  . Headache(784.0) 07/13/2011  . Health care maintenance 07/13/2011  . IMPAIRED FASTING GLUCOSE 01/26/2010  . ECZEMA, ATOPIC 11/05/2009  .  Hypothyroidism 03/12/2009  . Obesity, Class II, BMI 35-39.9 02/26/2009  . Essential hypertension 02/26/2009  . Iron deficiency anemia 05/20/2008  . ANXIETY 06/23/2006  . MENTAL RETARDATION 06/23/2006    Medications- reviewed and updated Current Outpatient Prescriptions  Medication Sig Dispense Refill  . amLODipine (NORVASC) 5 MG tablet Take 5 mg by mouth daily.  12  . divalproex (DEPAKOTE) 500 MG EC tablet 2 tabs by mouth daily at bedtime     . ferrous sulfate 325 (65 FE) MG tablet Take 1 tablet (325 mg total) by mouth 2 (two) times daily. 180 tablet 3  . LATUDA 40 MG TABS tablet TAKE 1 TABLET DAILY WITH THE EVENING MEAL**PA REQ**  1  . levothyroxine (SYNTHROID, LEVOTHROID) 112 MCG tablet TAKE 1 TABLET DAILY BEFORE BREAKFAST. 30 tablet 7  . omeprazole (PRILOSEC) 20 MG capsule Take 1 capsule (20 mg total) by mouth daily. 30 capsule 3  . ondansetron (ZOFRAN) 4 MG tablet Take 1 tablet (4 mg total) by mouth every 8 (eight) hours as needed for nausea or vomiting. 20 tablet 0  . pravastatin (PRAVACHOL) 20 MG tablet Take 20 mg by mouth daily.  12  . promethazine (PHENERGAN) 25 MG tablet Take 1 tablet (25 mg total) by mouth every 8 (eight) hours as needed for nausea or vomiting. 20 tablet 0  . propranolol (INDERAL) 60 MG tablet Take 1 tablet (60 mg total) by mouth 2 (two) times daily. 60 tablet 7  . ranitidine (ZANTAC) 150 MG tablet TAKE 1 TABLET BY MOUTH 2 TIMES DAILY. 60 tablet 7  .  sertraline (ZOLOFT) 100 MG tablet 1 and 1/2 tabs by mouth daily     . [DISCONTINUED] triamcinolone (KENALOG) 0.5 % cream Apply to affected area 2 times a day. Dispense 60 grams.      No current facility-administered medications for this visit.    Objective: Office vital signs reviewed. BP 125/76 mmHg  Pulse 73  Temp(Src) 99 F (37.2 C) (Oral)  Wt 208 lb (94.348 kg)  LMP 01/19/2015 01/27/15: 216 lb  10/23/14: 228lb   Physical Examination:  General: Awake, alert, sitting up in NAD, Pleasant.  Moist mucous  membranes  GI:  Well healed small laparoscopic incisions. Normoactive BS in all 4 quadrants. Soft, NT/ND. She points to the epigastric region where to identify where the pain is but denies any pain with deep palpation (and does not have a grimace).  Extremities:  No edema, cyanosis or clubbing; +2 pulses bilaterally, brisk capillary refill  Skin: dry, intact, no rashes or lesions. Normal skin turgor.  Assessment/Plan: Nausea with vomiting The patient is presenting with worsening nausea and vomiting and new onset diarrhea a few days ago. There was an initial concern for constipation causing nausea by her PCP. She could, in fact, have a stool burden causing her nausea and loose stools. She intermittently takes iron per her mother. They were not interested in a urine pregnancy at this time but this may need to be consider if she continues to have symptoms. Additionally, she has a h/o hypothyroidism, it is possible that she is taking too much Synthroid (causing diarrhea, vomiting, and weight loss) or that she has taken too few causing constipation with subsequent N/V and watery stools. While Depakote is more likely to cause N/V, Latuda has an approximately 10% risk of nausea. This could also be gastroenteritis, however the duration of symptoms would be quite odd.  I am concerned by her rapid weight loss but she does not too too terribly dehydrated on exam (normal HR and BP, normal turgor).  I am reassured by her physical exam and history of watery stools that this is less likely a SBO.  - Advised patient to have abdominal imaging done. - Rx for phenergan (patient had zofran on her med rec but states she hasn't taken this in quite some time.  - if no stool burden noted consider: CMET, lipase, urine pregnancy, TSH, stool studies  - if still no etiology noted, consider imaging vs touching base with the patient's psychiatrist at [facility] concerning her current regimen. - Give rapid weight loss and history of CKD,  I would prefer to keep a good eye on this patient, advised to f/u in 2 days. - strict RTC precautions discussed.     No orders of the defined types were placed in this encounter.    Meds ordered this encounter  Medications  . promethazine (PHENERGAN) 25 MG tablet    Sig: Take 1 tablet (25 mg total) by mouth every 8 (eight) hours as needed for nausea or vomiting.    Dispense:  20 tablet    Refill:  [place] PGY-2, [place]

## 2015-02-12 ENCOUNTER — Encounter: Payer: Self-pay | Admitting: Family Medicine

## 2015-02-12 DIAGNOSIS — R112 Nausea with vomiting, unspecified: Secondary | ICD-10-CM | POA: Insufficient documentation

## 2015-02-12 NOTE — Assessment & Plan Note (Signed)
The patient is presenting with worsening nausea and vomiting and new onset diarrhea a few days ago. There was an initial concern for constipation causing nausea by her PCP. She could, in fact, have a stool burden causing her nausea and loose stools. She intermittently takes iron per her mother. They were not interested in a urine pregnancy at this time but this may need to be consider if she continues to have symptoms. Additionally, she has a h/o hypothyroidism, it is possible that she is taking too much Synthroid (causing diarrhea, vomiting, and weight loss) or that she has taken too few causing constipation with subsequent N/V and watery stools. While Depakote is more likely to cause N/V, Latuda has an approximately 10% risk of nausea. This could also be gastroenteritis, however the duration of symptoms would be quite odd.  I am concerned by her rapid weight loss but she does not too too terribly dehydrated on exam (normal HR and BP, normal turgor).  I am reassured by her physical exam and history of watery stools that this is less likely a SBO.  - Advised patient to have abdominal imaging done. - Rx for phenergan (patient had zofran on her med rec but states she hasn't taken this in quite some time.  - if no stool burden noted consider: CMET, lipase, urine pregnancy, TSH, stool studies  - if still no etiology noted, consider imaging vs touching base with the patient's psychiatrist at [facility] concerning her current regimen. - Give rapid weight loss and history of CKD, I would prefer to keep a good eye on this patient, advised to f/u in 2 days. - strict RTC precautions discussed.

## 2015-02-13 ENCOUNTER — Ambulatory Visit (INDEPENDENT_AMBULATORY_CARE_PROVIDER_SITE_OTHER): Payer: Medicare Other | Admitting: Family Medicine

## 2015-02-13 ENCOUNTER — Ambulatory Visit
Admission: RE | Admit: 2015-02-13 | Discharge: 2015-02-13 | Disposition: A | Discharge status: Home / Self Care | Payer: Medicare Other | Source: Ambulatory Visit | Attending: Family Medicine | Admitting: Family Medicine

## 2015-02-13 ENCOUNTER — Encounter: Payer: Self-pay | Admitting: Family Medicine

## 2015-02-13 VITALS — BP 129/89 | HR 72 | Temp 98.4°F | Ht 65.0 in | Wt 209.0 lb

## 2015-02-13 DIAGNOSIS — R112 Nausea with vomiting, unspecified: Secondary | ICD-10-CM | POA: Diagnosis not present

## 2015-02-13 DIAGNOSIS — R197 Diarrhea, unspecified: Secondary | ICD-10-CM | POA: Diagnosis not present

## 2015-02-13 DIAGNOSIS — E039 Hypothyroidism, unspecified: Secondary | ICD-10-CM | POA: Diagnosis not present

## 2015-02-13 LAB — COMPLETE METABOLIC PANEL WITH GFR
ALBUMIN: 3.6 g/dL (ref 3.6–5.1)
ALK PHOS: 69 U/L (ref 33–115)
ALT: 32 U/L — ABNORMAL HIGH (ref 6–29)
AST: 33 U/L — ABNORMAL HIGH (ref 10–30)
BUN: 19 mg/dL (ref 7–25)
CO2: 17 mmol/L — AB (ref 20–31)
Calcium: 8.6 mg/dL (ref 8.6–10.2)
Chloride: 112 mmol/L — ABNORMAL HIGH (ref 98–110)
Creat: 2.97 mg/dL — ABNORMAL HIGH (ref 0.50–1.10)
GFR, EST AFRICAN AMERICAN: 24 mL/min — AB (ref >=60)
GFR, EST NON AFRICAN AMERICAN: 20 mL/min — AB (ref >=60)
GLUCOSE: 75 mg/dL (ref 65–99)
POTASSIUM: 4.3 mmol/L (ref 3.5–5.3)
SODIUM: 139 mmol/L (ref 135–146)
Total Bilirubin: 0.3 mg/dL (ref 0.2–1.2)
Total Protein: 7.2 g/dL (ref 6.1–8.1)

## 2015-02-13 MED ORDER — ONDANSETRON 4 MG PO TBDP: 4.0000 mg | ORAL_TABLET | Freq: Three times a day (TID) | ORAL | Status: DC | PRN

## 2015-02-13 NOTE — Patient Instructions (Addendum)
Thank you for coming to see me today. It was a pleasure. Today we talked about:   Nausea/vomtiing/diarrhea: I would like you to get that x-ray of your stomach. I am getting some labs today as well. You will get a letter or a phone call for the labs. Please keep WELL HYDRATED.   Please make an appointment to see Dr. Lajuana Ripple in 2 weeks, or sooner for follow-up.  If you have any questions or concerns, please do not hesitate to call the office at [phone].  Sincerely,  Cordelia Poche, MD  Diarrhea Diarrhea is frequent loose and watery bowel movements. It can cause you to feel weak and dehydrated. Dehydration can cause you to become tired and thirsty, have a dry mouth, and have decreased urination that often is dark yellow. Diarrhea is a sign of another problem, most often an infection that will not last long. In most cases, diarrhea typically lasts 2-3 days. However, it can last longer if it is a sign of something more serious. It is important to treat your diarrhea as directed by your caregiver to lessen or prevent future episodes of diarrhea. CAUSES  Some common causes include:  Gastrointestinal infections caused by viruses, bacteria, or parasites.  Food poisoning or food allergies.  Certain medicines, such as antibiotics, chemotherapy, and laxatives.  Artificial sweeteners and fructose.  Digestive disorders. HOME CARE INSTRUCTIONS  Ensure adequate fluid intake (hydration): Have 1 cup (8 oz) of fluid for each diarrhea episode. Avoid fluids that contain simple sugars or sports drinks, fruit juices, whole milk products, and sodas. Your urine should be clear or pale yellow if you are drinking enough fluids. Hydrate with an oral rehydration solution that you can purchase at pharmacies, retail stores, and online. You can prepare an oral rehydration solution at home by mixing the following ingredients together:   - tsp table salt.   tsp baking soda.   tsp salt substitute containing  potassium chloride.  1  tablespoons sugar.  1 L (34 oz) of water.  Certain foods and beverages may increase the speed at which food moves through the gastrointestinal (GI) tract. These foods and beverages should be avoided and include:  Caffeinated and alcoholic beverages.  High-fiber foods, such as raw fruits and vegetables, nuts, seeds, and whole grain breads and cereals.  Foods and beverages sweetened with sugar alcohols, such as xylitol, sorbitol, and mannitol.  Some foods may be well tolerated and may help thicken stool including:  Starchy foods, such as rice, toast, pasta, low-sugar cereal, oatmeal, grits, baked potatoes, crackers, and bagels.  Bananas.  Applesauce.  Add probiotic-rich foods to help increase healthy bacteria in the GI tract, such as yogurt and fermented milk products.  Wash your hands well after each diarrhea episode.  Only take over-the-counter or prescription medicines as directed by your caregiver.  Take a warm bath to relieve any burning or pain from frequent diarrhea episodes. SEEK IMMEDIATE MEDICAL CARE IF:   You are unable to keep fluids down.  You have persistent vomiting.  You have blood in your stool, or your stools are black and tarry.  You do not urinate in 6-8 hours, or there is only a small amount of very dark urine.  You have abdominal pain that increases or localizes.  You have weakness, dizziness, confusion, or light-headedness.  You have a severe headache.  Your diarrhea gets worse or does not get better.  You have a fever or persistent symptoms for more than 2-3 days.  You have  a fever and your symptoms suddenly get worse. MAKE SURE YOU:   Understand these instructions.  Will watch your condition.  Will get help right away if you are not doing well or get worse.   This information is not intended to replace advice given to you by your health care provider. Make sure you discuss any questions you have with your health  care provider.   Document Released: 04/02/2002 Document Revised: 05/03/2014 Document Reviewed: 12/19/2011 Elsevier Interactive Patient Education [employer].

## 2015-02-13 NOTE — Progress Notes (Signed)
    Subjective   Vanessa Alvarez is a 29 y.o. female that presents for a same day visit  1. Vomiting: Patient is here for reevaluation of vomiting and diarrhea. Last bowel movement was yesterday. She is having about 4 watery stools per day. Symptoms might be improving. Emesis is sometimes yellow and food contents. She has emesis with or without eating. She has not gotten an x-ray. She has a tmax 100 degrees farenheit. No sick contacts. No history of sexual intercourse. She is adherent with synthroid daily. No bloody emesis or hematochezia. Nausea has been present for about 2 months. Emesis for 3 weeks and diarrhea for 1 week.   ROS Per HPI  Social History  Substance Use Topics  . Smoking status: Never Smoker   . Smokeless tobacco: None  . Alcohol Use: No    No Known Allergies  Objective   BP 129/89 mmHg  Pulse 72  Temp(Src) 98.4 F (36.9 C) (Oral)  Ht 5\' 5"  (1.651 m)  Wt 209 lb (94.802 kg)  BMI 34.78 kg/m2  LMP 01/19/2015  General: Well appearing, no distress Gastrointestinal: Soft, non-tender, non-distended. No rebound or guarding. Some firm areas possibly consistent with stool  Assessment and Plan   Meds ordered this encounter  Medications  . ondansetron (ZOFRAN ODT) 4 MG disintegrating tablet    Sig: Take 1 tablet (4 mg total) by mouth every 8 (eight) hours as needed for nausea or vomiting.    Dispense:  20 tablet    Refill:  0   Orders Placed This Encounter  Procedures  . COMPLETE METABOLIC PANEL WITH GFR  . TSH    Nausea/vomiting with diarrhea: no red flags. Will get some labs today. Reiterated importance of abdominal x-ray. Keep well hydrated, preferably with water. Red flags discussed. Follow-up with PCP.

## 2015-02-14 ENCOUNTER — Telehealth: Payer: Self-pay | Admitting: Family Medicine

## 2015-02-14 ENCOUNTER — Encounter: Payer: Self-pay | Admitting: Family Medicine

## 2015-02-14 DIAGNOSIS — R1013 Epigastric pain: Secondary | ICD-10-CM

## 2015-02-14 LAB — TSH: TSH: 6.18 u[IU]/mL — ABNORMAL HIGH (ref 0.350–4.500)

## 2015-02-14 MED ORDER — RANITIDINE HCL 150 MG PO TABS: ORAL_TABLET | ORAL | Status: DC

## 2015-02-14 NOTE — Telephone Encounter (Signed)
Called to discuss abdominal xray, which did NOT reveal significant constipation causing N/V/D.  I am unsure if Dr Lonny Prude has discussed patient's blood labs obtained yesterday yet.  However, it appears that her TSH is elevated.  I would like to know if patient has been taking her Synthroid daily.  If so, we need to repeat this in about 6-8 weeks.  In addition, her Cr seems to be rising.  This could be 2/2 dehydration in the setting of N/V/D.  However, patient may want to consider scheduling an appt with her kidney doctor for evaluation.  Also, patient should decrease Zantac to 1 tablet daily in the setting of impaired renal function.    Ashly M. Lajuana Ripple, DO PGY-2, [place]

## 2015-02-14 NOTE — Telephone Encounter (Signed)
Pt mother called and she was informed of the below information.  She stated that the pt has been taking her synthroid however has missed a few doses due to being unable to keep it down.  Told mom to continue taking the synthroid and that she should have a recheck on that in 6-8 weeks per Dr. Lajuana Ripple message below. Katharina Caper, Darriel Utter D, [place]

## 2015-02-19 DIAGNOSIS — I129 Hypertensive chronic kidney disease with stage 1 through stage 4 chronic kidney disease, or unspecified chronic kidney disease: Secondary | ICD-10-CM | POA: Diagnosis not present

## 2015-02-19 DIAGNOSIS — D631 Anemia in chronic kidney disease: Secondary | ICD-10-CM | POA: Diagnosis not present

## 2015-02-19 DIAGNOSIS — N049 Nephrotic syndrome with unspecified morphologic changes: Secondary | ICD-10-CM | POA: Diagnosis not present

## 2015-02-19 DIAGNOSIS — N051 Unspecified nephritic syndrome with focal and segmental glomerular lesions: Secondary | ICD-10-CM | POA: Diagnosis not present

## 2015-02-19 DIAGNOSIS — E785 Hyperlipidemia, unspecified: Secondary | ICD-10-CM | POA: Diagnosis not present

## 2015-02-19 DIAGNOSIS — R7989 Other specified abnormal findings of blood chemistry: Secondary | ICD-10-CM | POA: Diagnosis not present

## 2015-02-21 ENCOUNTER — Encounter: Payer: Self-pay | Admitting: Family Medicine

## 2015-02-21 ENCOUNTER — Ambulatory Visit (INDEPENDENT_AMBULATORY_CARE_PROVIDER_SITE_OTHER): Payer: Medicare Other | Admitting: Family Medicine

## 2015-02-21 VITALS — BP 125/79 | HR 74 | Temp 98.3°F | Ht 65.0 in | Wt 213.5 lb

## 2015-02-21 DIAGNOSIS — Z23 Encounter for immunization: Secondary | ICD-10-CM

## 2015-02-21 DIAGNOSIS — R112 Nausea with vomiting, unspecified: Secondary | ICD-10-CM

## 2015-02-21 DIAGNOSIS — N183 Chronic kidney disease, stage 3 unspecified: Secondary | ICD-10-CM

## 2015-02-21 DIAGNOSIS — R1013 Epigastric pain: Secondary | ICD-10-CM | POA: Diagnosis not present

## 2015-02-21 MED ORDER — ONDANSETRON 4 MG PO TBDP: 4.0000 mg | ORAL_TABLET | Freq: Three times a day (TID) | ORAL | Status: DC | PRN

## 2015-02-21 NOTE — Patient Instructions (Addendum)
Plan to follow up with your Kidney doctor as scheduled for routine care.  Again, your Ranitidine should be taken only ONCE daily now because your kidney function is impaired.  I have sent in a refill for your nausea medication.  If your appetite gets worse, you are vomiting blood, you are not able to stay hydrated, please seek immediate medical attention.  Follow up with me in 6 weeks to retest your thyroid.

## 2015-02-21 NOTE — Assessment & Plan Note (Signed)
10/26: Cr> 4.0.  Cellcept started.  Apparently, told at that appt that she would start HD/ get a fistula placed.  No evidence of this in office note. - Follow up with Dr Justin Mend 12/2.

## 2015-02-21 NOTE — Progress Notes (Signed)
    Subjective: CC: f/u nausea/vomiting/ decreased appetite HPI: Patient is a 29 y.o. female presenting to clinic today for follow up visit. Concerns today include:  1. N/V/anorexia Patient reports that vomiting has gotten better since last appt.  New nausea medication.  No constipation, diarrhea, fevers.  Occ chills.  No hematemesis.  Appetite is worse than last time.  She attributes this to nausea.  She does feel that when she takes the nausea medication her appetite is better.  Patient drinks gatorade at home.  Eating fruit and dinner normally per mother.  2. Epigastric discomfort No heartburn symptoms.  Well controlled on current regimen.  N/V as above.  3. Kidney disease Saw patient on Wednesday.  Nephrologist wants to place patient on dialysis.  Surgeon to contact patient.  Started on Cellcept 250mg  BID.  02/19/15 Renal note and labs reviewed: Cr 4.52, GFR 14, phos 6.1.  No mention in note of referral for fistula placement/ HD initiation.  Follow up with Dr Justin Mend 12/2.  Social History Reviewed: non smoker. FamHx and MedHx updated.  Please see EMR. Health Maintenance: Flu shot due  ROS: Per HPI  Objective: Office vital signs reviewed. BP 125/79 mmHg  Pulse 74  Temp(Src) 98.3 F (36.8 C) (Oral)  Ht 5\' 5"  (1.651 m)  Wt 213 lb 8 oz (96.843 kg)  BMI 35.53 kg/m2  LMP 02/16/2015 (Exact Date)  Physical Examination:  General: Awake, alert, obese, NAD HEENT: Normal, MMM Cardio: RRR, S1S2 heard, no murmurs appreciated Pulm: CTAB, no wheezes, rhonchi or rales, normal WOB GI: soft, NT/ND,+BS x4, no masses Extremities: WWP, No edema, cyanosis or clubbing; +2 posterior tib. pulses bilaterally MSK: Normal gait and station  02/19/15 Renal note and labs reviewed: Cr 4.52, GFR 14, phos 6.1.  Assessment/ Plan: 29 y.o. female with  Nausea with vomiting Improved with Zofran.  I have a high suspicion that symptoms are 2/2 to renal function.  Patient appears to be maintaining weight,  with a  5lb weight gain in the last few weeks.  Mother states that she is eating. - Zofran refilled - Return precautions reviewed - Follow up in 1 month.  Chronic kidney disease, stage III (moderate) 10/26: Cr> 4.0.  Cellcept started.  Apparently, told at that appt that she would start HD/ get a fistula placed.  No evidence of this in office note. - Follow up with Dr Justin Mend 12/2.  Encounter for immunization Flu shot administered today  [name], DO PGY-2, [place]

## 2015-02-21 NOTE — Assessment & Plan Note (Signed)
Improved with Zofran.  I have a high suspicion that symptoms are 2/2 to renal function.  Patient appears to be maintaining weight, with a  5lb weight gain in the last few weeks.  Mother states that she is eating. - Zofran refilled - Return precautions reviewed - Follow up in 1 month.

## 2015-03-03 ENCOUNTER — Encounter: Payer: Self-pay | Admitting: Family Medicine

## 2015-03-03 ENCOUNTER — Ambulatory Visit (INDEPENDENT_AMBULATORY_CARE_PROVIDER_SITE_OTHER): Payer: Medicare Other | Admitting: Family Medicine

## 2015-03-03 VITALS — BP 134/81 | HR 82 | Temp 98.2°F | Ht 65.0 in | Wt 210.0 lb

## 2015-03-03 DIAGNOSIS — R112 Nausea with vomiting, unspecified: Secondary | ICD-10-CM

## 2015-03-03 DIAGNOSIS — K625 Hemorrhage of anus and rectum: Secondary | ICD-10-CM | POA: Diagnosis not present

## 2015-03-03 DIAGNOSIS — R1110 Vomiting, unspecified: Secondary | ICD-10-CM

## 2015-03-03 LAB — CBC
HCT: 36.2 % (ref 36.0–46.0)
Hemoglobin: 11.6 g/dL — ABNORMAL LOW (ref 12.0–15.0)
MCH: 25 pg — AB (ref 26.0–34.0)
MCHC: 32 g/dL (ref 30.0–36.0)
MCV: 78 fL (ref 78.0–100.0)
MPV: 9.5 fL (ref 8.6–12.4)
Platelets: 400 10*3/uL (ref 150–400)
RBC: 4.64 MIL/uL (ref 3.87–5.11)
RDW: 16.4 % — AB (ref 11.5–15.5)
WBC: 8.4 10*3/uL (ref 4.0–10.5)

## 2015-03-03 MED ORDER — POLYETHYLENE GLYCOL 3350 17 GM/SCOOP PO POWD: 17.0000 g | Freq: Every day | ORAL | Status: DC | PRN

## 2015-03-03 NOTE — Progress Notes (Signed)
Patient ID: Vanessa Alvarez, female   DOB: 08/23/1985, 29 y.o.   MRN: [id number]   Subjective: CC:  Nausea/Vomiting, rectal bleeding HPI: This history was provided by patient and her mother.  Patient's PMH is significant for CKD, essential HTN, intractable vomiting, mental retardation, anxiety, GERD and hypothyroidism.  Patient is a 29 y.o. female presenting to clinic today for ongoing nausea and vomiting for "months" according to mother.  Patient states she vomits at least once a day.  Patient also complains of one episode of bright red blood in toilet and on tissue following a BM on 03/02/15.  Patient states she struggles with constipation and had strained to have a BM before noticing the hematochezia.  Patient also complains of chronic lower abdominal pain that is worse when she has to have a BM.  Patient states the pain is alleviated with a BM.  Patient denies having hemorrhoids, melena, fever, SOB, chest pain, vaginal discharge, hematuria, dysuria, urinary frequency and urgency.    Concerns today include:  1. Rectal bleeding 2. Intractable N/V  Social History Reviewed: Never smoker. FamHx and MedHx updated.  Please see EMR.  ROS: Per HPI  Objective: Office vital signs reviewed. BP 134/81 mmHg  Pulse 82  Temp(Src) 98.2 F (36.8 C) (Oral)  Ht 5\' 5"  (1.651 m)  Wt 210 lb (95.255 kg)  BMI 34.95 kg/m2  LMP 02/16/2015 (Exact Date)  Physical Examination:  General: Pleasant, obese AA female.  Awake, alert, well-nourished and in NAD HEENT:    Throat: MMM, no erythema Cardio: RRR, S1S2 heard, no murmurs appreciated, No edema, cyanosis or clubbing; +2 radial and dorsalis pedis pulses bilaterally.  Cap refill <2 seconds. Pulm: CTAB, no wheezes, rhonchi or rales GI: soft, NT/ND,+BS x4, no hepatomegaly, no splenomegaly Skin: Dry, intact, no rashes or lesions.  Good skin turgor.  Sherron Ales, NP [facility]

## 2015-03-03 NOTE — Progress Notes (Signed)
Date of Visit: 03/03/2015   HPI:  Pt presents for a same day appointment to discuss nausea/vomiting and rectal bleeding.  Patient has history of FSGS, followed closely by Dr. Justin Mend as an outpatient, recently diagnosed in August 2016. Has had ongoing issues with nausea and vomiting for the last several months. Taking zofran, which patient does not think is helping. Vomits about once per day. Not eating and drinking much due to nausea. Also, patient reports an episode of bright red blood per rectum yesterday evening. Noted there was blood in the toilet and on the toilet paper. Has not happened again since then. Does note frequent constipation. She takes OTC milk of magnesia for constipation, has not been prescribed this by a doctor. Prior to the rectal bleeding, was straining hard to stool. Adamantly refuses rectal exam today. She is accompanied today by her mother.  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 Mucous membranes moist, slightly tacky. Nares patent and moist. Heart: regular rate and rhythm no murmur Lungs: clear to auscultation bilaterally, NWOB Abdomen: soft, nontender to palpation. No masses or organomegaly Neuro: grossly nonfocal, speech normal Extremity: brisk capillary refill distally  ASSESSMENT/PLAN:  Nausea with vomiting Continued nausea and vomiting, unrelieved by zofran. Has brisk capillary refill and is not tachycardic, without weight loss, so does seem to be adequately hydrating chronically. Abdominal exam benign. Discussed options for further evaluation with patient and her mother today.  - They would like referral to GI. Will enter  referral.  - Will also check CMET, CBC, mag, and phos to rule out progression of renal disease as cause for continued nausea/vomiting. - Continue small sips of fluids to stay hydrated - Discussed return/ED precautions including if she is unable to drink at all  Rectal bleeding One, self limited episode of rectal bleeding. Patient refuses rectal exam today so unable to assess for etiology on exam. Given report of straining and hard stool, suspect constipation/hemorrhoids.  - Will check CBC today to ensure stability of Hgb.  - Counseled NOT to use milk of magnesia due to her chronic renal disease. Will instead prescribe miralax to help with bowel movements.     FOLLOW UP: F/u in several weeks with PCP for above issues Referring to GI.   Clarita. Ardelia Mems, [place]

## 2015-03-03 NOTE — Patient Instructions (Signed)
Checking labwork today STOP milk of magnesia. Use miralax as needed for constipation. Go to ED for fluids if you truly cannot drink at all Take small sips Continue zofran Follow up with Dr. Justin Mend as scheduled  I am referring you to a stomach doctor for your vomiting. You will get a phone call to schedule this appointment.   Follow up with Dr. Lajuana Ripple in a few weeks  Be well, Dr. Ardelia Mems

## 2015-03-04 ENCOUNTER — Inpatient Hospital Stay (HOSPITAL_COMMUNITY)
Admission: AD | Admit: 2015-03-04 | Discharge: 2015-03-05 | DRG: 699 | Disposition: A | Discharge status: Home / Self Care | Payer: Medicare Other | Source: Ambulatory Visit | Attending: Family Medicine | Admitting: Family Medicine

## 2015-03-04 ENCOUNTER — Encounter (HOSPITAL_COMMUNITY): Payer: Self-pay | Admitting: General Practice

## 2015-03-04 ENCOUNTER — Telehealth: Payer: Self-pay | Admitting: Family Medicine

## 2015-03-04 DIAGNOSIS — N269 Renal sclerosis, unspecified: Secondary | ICD-10-CM | POA: Diagnosis present

## 2015-03-04 DIAGNOSIS — F840 Autistic disorder: Secondary | ICD-10-CM | POA: Diagnosis present

## 2015-03-04 DIAGNOSIS — D509 Iron deficiency anemia, unspecified: Secondary | ICD-10-CM | POA: Diagnosis present

## 2015-03-04 DIAGNOSIS — F39 Unspecified mood [affective] disorder: Secondary | ICD-10-CM | POA: Diagnosis present

## 2015-03-04 DIAGNOSIS — F79 Unspecified intellectual disabilities: Secondary | ICD-10-CM | POA: Diagnosis present

## 2015-03-04 DIAGNOSIS — E039 Hypothyroidism, unspecified: Secondary | ICD-10-CM | POA: Diagnosis not present

## 2015-03-04 DIAGNOSIS — Z6835 Body mass index (BMI) 35.0-35.9, adult: Secondary | ICD-10-CM | POA: Diagnosis not present

## 2015-03-04 DIAGNOSIS — F419 Anxiety disorder, unspecified: Secondary | ICD-10-CM | POA: Diagnosis present

## 2015-03-04 DIAGNOSIS — K625 Hemorrhage of anus and rectum: Secondary | ICD-10-CM

## 2015-03-04 DIAGNOSIS — E669 Obesity, unspecified: Secondary | ICD-10-CM | POA: Diagnosis present

## 2015-03-04 DIAGNOSIS — Z79899 Other long term (current) drug therapy: Secondary | ICD-10-CM

## 2015-03-04 DIAGNOSIS — N184 Chronic kidney disease, stage 4 (severe): Secondary | ICD-10-CM | POA: Insufficient documentation

## 2015-03-04 DIAGNOSIS — R109 Unspecified abdominal pain: Secondary | ICD-10-CM | POA: Diagnosis not present

## 2015-03-04 DIAGNOSIS — N183 Chronic kidney disease, stage 3 (moderate): Secondary | ICD-10-CM | POA: Diagnosis present

## 2015-03-04 DIAGNOSIS — I10 Essential (primary) hypertension: Secondary | ICD-10-CM | POA: Insufficient documentation

## 2015-03-04 DIAGNOSIS — N186 End stage renal disease: Secondary | ICD-10-CM | POA: Insufficient documentation

## 2015-03-04 DIAGNOSIS — N051 Unspecified nephritic syndrome with focal and segmental glomerular lesions: Secondary | ICD-10-CM | POA: Diagnosis present

## 2015-03-04 DIAGNOSIS — I129 Hypertensive chronic kidney disease with stage 1 through stage 4 chronic kidney disease, or unspecified chronic kidney disease: Secondary | ICD-10-CM | POA: Diagnosis present

## 2015-03-04 DIAGNOSIS — I120 Hypertensive chronic kidney disease with stage 5 chronic kidney disease or end stage renal disease: Secondary | ICD-10-CM | POA: Diagnosis not present

## 2015-03-04 DIAGNOSIS — E785 Hyperlipidemia, unspecified: Secondary | ICD-10-CM | POA: Diagnosis present

## 2015-03-04 DIAGNOSIS — N032 Chronic nephritic syndrome with diffuse membranous glomerulonephritis: Secondary | ICD-10-CM | POA: Diagnosis not present

## 2015-03-04 DIAGNOSIS — D631 Anemia in chronic kidney disease: Secondary | ICD-10-CM | POA: Diagnosis not present

## 2015-03-04 DIAGNOSIS — K5900 Constipation, unspecified: Secondary | ICD-10-CM | POA: Diagnosis present

## 2015-03-04 DIAGNOSIS — N189 Chronic kidney disease, unspecified: Secondary | ICD-10-CM | POA: Diagnosis not present

## 2015-03-04 DIAGNOSIS — K219 Gastro-esophageal reflux disease without esophagitis: Secondary | ICD-10-CM | POA: Diagnosis present

## 2015-03-04 DIAGNOSIS — R392 Extrarenal uremia: Secondary | ICD-10-CM | POA: Diagnosis not present

## 2015-03-04 HISTORY — DX: Gastro-esophageal reflux disease without esophagitis: K21.9

## 2015-03-04 HISTORY — DX: Hemorrhage of anus and rectum: K62.5

## 2015-03-04 LAB — COMPREHENSIVE METABOLIC PANEL
ALK PHOS: 85 U/L (ref 33–115)
ALK PHOS: 73 U/L (ref 38–126)
ALT: 4 U/L — AB (ref 6–29)
ALT: 8 U/L — AB (ref 14–54)
AST: 9 U/L — AB (ref 10–30)
AST: 12 U/L — ABNORMAL LOW (ref 15–41)
Albumin: 2 g/dL — ABNORMAL LOW (ref 3.6–5.1)
Albumin: 1.4 g/dL — ABNORMAL LOW (ref 3.5–5.0)
Anion gap: 8 (ref 5–15)
BUN: 35 mg/dL — AB (ref 7–25)
BUN: 34 mg/dL — ABNORMAL HIGH (ref 6–20)
CALCIUM: 8.4 mg/dL — AB (ref 8.9–10.3)
CO2: 20 mmol/L (ref 20–31)
CO2: 21 mmol/L — AB (ref 22–32)
CREATININE: 6.54 mg/dL — AB (ref 0.50–1.10)
CREATININE: 7.19 mg/dL — AB (ref 0.44–1.00)
Calcium: 7.9 mg/dL — ABNORMAL LOW (ref 8.6–10.2)
Chloride: 108 mmol/L (ref 98–110)
Chloride: 110 mmol/L (ref 101–111)
GFR calc non Af Amer: 7 mL/min — ABNORMAL LOW (ref >60)
GFR, EST AFRICAN AMERICAN: 8 mL/min — AB (ref >60)
GLUCOSE: 84 mg/dL (ref 65–99)
Glucose, Bld: 108 mg/dL — ABNORMAL HIGH (ref 65–99)
Potassium: 4.6 mmol/L (ref 3.5–5.3)
Potassium: 5 mmol/L (ref 3.5–5.1)
SODIUM: 139 mmol/L (ref 135–146)
SODIUM: 139 mmol/L (ref 135–145)
TOTAL PROTEIN: 5.3 g/dL — AB (ref 6.1–8.1)
Total Bilirubin: 0.2 mg/dL (ref 0.2–1.2)
Total Bilirubin: 0.6 mg/dL (ref 0.3–1.2)
Total Protein: 6 g/dL — ABNORMAL LOW (ref 6.5–8.1)

## 2015-03-04 LAB — CBC
HCT: 32 % — ABNORMAL LOW (ref 36.0–46.0)
Hemoglobin: 10.2 g/dL — ABNORMAL LOW (ref 12.0–15.0)
MCH: 24.9 pg — AB (ref 26.0–34.0)
MCHC: 31.9 g/dL (ref 30.0–36.0)
MCV: 78.2 fL (ref 78.0–100.0)
PLATELETS: 334 10*3/uL (ref 150–400)
RBC: 4.09 MIL/uL (ref 3.87–5.11)
RDW: 15.5 % (ref 11.5–15.5)
WBC: 6.7 10*3/uL (ref 4.0–10.5)

## 2015-03-04 LAB — PHOSPHORUS
PHOSPHORUS: 7.2 mg/dL — AB (ref 2.5–4.5)
Phosphorus: 7.1 mg/dL — ABNORMAL HIGH (ref 2.5–4.6)

## 2015-03-04 LAB — URINALYSIS, ROUTINE W REFLEX MICROSCOPIC
BILIRUBIN URINE: NEGATIVE
GLUCOSE, UA: NEGATIVE mg/dL
KETONES UR: NEGATIVE mg/dL
Leukocytes, UA: NEGATIVE
Nitrite: NEGATIVE
PH: 6 (ref 5.0–8.0)
Protein, ur: >300 mg/dL — AB
Specific Gravity, Urine: 1.035 — ABNORMAL HIGH (ref 1.005–1.030)
Urobilinogen, UA: 0.2 mg/dL (ref 0.0–1.0)

## 2015-03-04 LAB — MAGNESIUM
MAGNESIUM: 2.2 mg/dL (ref 1.7–2.4)
Magnesium: 2.2 mg/dL (ref 1.5–2.5)

## 2015-03-04 LAB — URINE MICROSCOPIC-ADD ON

## 2015-03-04 MED ORDER — FERROUS SULFATE 325 (65 FE) MG PO TABS
325.0000 mg | ORAL_TABLET | Freq: Two times a day (BID) | ORAL | Status: DC
  Administered 2015-03-04 – 2015-03-05 (×3): 325 mg via ORAL
  Filled 2015-03-04 (×3): qty 1

## 2015-03-04 MED ORDER — SODIUM CHLORIDE 0.9 % IJ SOLN
3.0000 mL | Freq: Two times a day (BID) | INTRAMUSCULAR | Status: DC
  Administered 2015-03-04 – 2015-03-05 (×3): 3 mL via INTRAVENOUS

## 2015-03-04 MED ORDER — SODIUM CHLORIDE 0.9 % IV SOLN: 250.0000 mL | INTRAVENOUS | Status: DC | PRN

## 2015-03-04 MED ORDER — SERTRALINE HCL 100 MG PO TABS
100.0000 mg | ORAL_TABLET | Freq: Every day | ORAL | Status: DC
  Administered 2015-03-05: 100 mg via ORAL
  Filled 2015-03-04 (×2): qty 1

## 2015-03-04 MED ORDER — PANTOPRAZOLE SODIUM 40 MG PO TBEC
40.0000 mg | DELAYED_RELEASE_TABLET | Freq: Every day | ORAL | Status: DC
  Administered 2015-03-04 – 2015-03-05 (×2): 40 mg via ORAL
  Filled 2015-03-04 (×2): qty 1

## 2015-03-04 MED ORDER — HEPARIN SODIUM (PORCINE) 5000 UNIT/ML IJ SOLN
5000.0000 [IU] | Freq: Three times a day (TID) | INTRAMUSCULAR | Status: DC
  Administered 2015-03-04 – 2015-03-05 (×4): 5000 [IU] via SUBCUTANEOUS
  Filled 2015-03-04 (×4): qty 1

## 2015-03-04 MED ORDER — LEVOTHYROXINE SODIUM 112 MCG PO TABS
112.0000 ug | ORAL_TABLET | Freq: Every day | ORAL | Status: DC
  Administered 2015-03-05: 112 ug via ORAL
  Filled 2015-03-04: qty 1

## 2015-03-04 MED ORDER — POLYETHYLENE GLYCOL 3350 17 GM/SCOOP PO POWD
17.0000 g | Freq: Every day | ORAL | Status: DC | PRN
  Filled 2015-03-04: qty 255

## 2015-03-04 MED ORDER — PRAVASTATIN SODIUM 20 MG PO TABS
20.0000 mg | ORAL_TABLET | Freq: Every day | ORAL | Status: DC
  Administered 2015-03-04 – 2015-03-05 (×2): 20 mg via ORAL
  Filled 2015-03-04 (×2): qty 1

## 2015-03-04 MED ORDER — LURASIDONE HCL 40 MG PO TABS
40.0000 mg | ORAL_TABLET | Freq: Every day | ORAL | Status: DC
  Administered 2015-03-05: 40 mg via ORAL
  Filled 2015-03-04 (×3): qty 1

## 2015-03-04 MED ORDER — FAMOTIDINE 20 MG PO TABS
20.0000 mg | ORAL_TABLET | Freq: Every day | ORAL | Status: DC
Start: 2015-03-04 — End: 2015-03-05
  Administered 2015-03-04 – 2015-03-05 (×2): 20 mg via ORAL
  Filled 2015-03-04 (×2): qty 1

## 2015-03-04 MED ORDER — SODIUM CHLORIDE 0.9 % IJ SOLN: 3.0000 mL | INTRAMUSCULAR | Status: DC | PRN

## 2015-03-04 MED ORDER — DIVALPROEX SODIUM 500 MG PO DR TAB
1000.0000 mg | DELAYED_RELEASE_TABLET | Freq: Every day | ORAL | Status: DC
  Filled 2015-03-04 (×2): qty 2

## 2015-03-04 MED ORDER — POLYETHYLENE GLYCOL 3350 17 G PO PACK
17.0000 g | PACK | Freq: Every day | ORAL | Status: DC | PRN
  Administered 2015-03-04: 17 g via ORAL
  Filled 2015-03-04 (×2): qty 1

## 2015-03-04 MED ORDER — NEPRO/CARBSTEADY PO LIQD
237.0000 mL | Freq: Two times a day (BID) | ORAL | Status: DC
  Administered 2015-03-04: 237 mL via ORAL

## 2015-03-04 MED ORDER — ACETAMINOPHEN 325 MG PO TABS: 650.0000 mg | ORAL_TABLET | Freq: Four times a day (QID) | ORAL | Status: DC | PRN

## 2015-03-04 MED ORDER — MYCOPHENOLATE MOFETIL 250 MG PO CAPS
250.0000 mg | ORAL_CAPSULE | Freq: Two times a day (BID) | ORAL | Status: DC
  Filled 2015-03-04: qty 1

## 2015-03-04 MED ORDER — ACETAMINOPHEN 650 MG RE SUPP: 650.0000 mg | Freq: Four times a day (QID) | RECTAL | Status: DC | PRN

## 2015-03-04 MED ORDER — ONDANSETRON 4 MG PO TBDP
4.0000 mg | ORAL_TABLET | Freq: Three times a day (TID) | ORAL | Status: DC | PRN
  Filled 2015-03-04: qty 1

## 2015-03-04 MED ORDER — PROPRANOLOL HCL 60 MG PO TABS
60.0000 mg | ORAL_TABLET | Freq: Two times a day (BID) | ORAL | Status: DC
  Administered 2015-03-04 – 2015-03-05 (×2): 60 mg via ORAL
  Filled 2015-03-04 (×5): qty 1

## 2015-03-04 NOTE — Consult Note (Signed)
[facility] Renal Consultation Note  Requesting MD: Gwendlyn Deutscher Indication for Consultation: advanced CKD- uremic symptoms  HPI:  Vanessa Alvarez is a 29 y.o. 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 Justin Mend at [facility].  She has experienced worsening in her renal function and was sent to see [place]. She underwent a kidney biopsy in August of this year which unfortunately showed FSG with greater than 90% involvement and very advanced disease. She was placed on CellCept to see if there was any reversibility. But unfortunately, her kidney function continued to worsen. She was seeing Dr. Justin Mend on a monthly basis. Creatinine was noted to be in the fours not that long ago and patient was sent for dialysis education. She was initially unsure but now opts for in Center hemodialysis. She had labs done by her primary care physician and creatinine was noted to be greater than 6 if she was referred for direct admission. When I talked to she and her mother, it does appear that she's having some uremic symptoms. She reports nausea daily and actually vomiting daily that's been going on for the last couple of months. Nausea predated really most of her renal disease. She also reports being fatigued. She reports that the symptoms are interfering with the activities that she would usually enjoy. Therefore, feels like she has chronic indications to initiate dialysis therapy. However, her last stay do not indicate acute indications for immediate dialysis.  CREAT  Date/Time Value Ref Range Status  03/03/2015 11:52 AM 6.54* 0.50 - 1.10 mg/dL Final  02/13/2015 09:58 AM 2.97* 0.50 - 1.10 mg/dL Final  10/23/2014 04:01 PM 2.66* 0.50 - 1.10 mg/dL Final  10/04/2014 03:48 PM 2.74* 0.50 - 1.10 mg/dL Final  09/30/2014 08:46 AM 2.33* 0.50 - 1.10 mg/dL Final  05/10/2013 09:41 AM 1.17* 0.50 - 1.10 mg/dL Final  04/11/2013 09:34 AM 1.35* 0.50 - 1.10 mg/dL Final  02/26/2013 09:28 AM 1.11* 0.50 -  1.10 mg/dL Final  10/20/2012 09:40 AM 1.22* 0.50 - 1.10 mg/dL Final  12/10/2011 09:28 AM 0.91 0.50 - 1.10 mg/dL Final  07/08/2011 09:15 AM 0.99 0.50 - 1.10 mg/dL Final  09/17/2010 09:39 AM 1.01 0.40 - 1.20 mg/dL Final   CREATININE, SER  Date/Time Value Ref Range Status  07/16/2009 06:18 PM 1.01 0.40-1.20 mg/dL Final  02/26/2009 09:04 PM 0.91 0.40-1.20 mg/dL Final  05/16/2008 08:34 PM 0.93 0.40-1.20 mg/dL Final     PMHx:   Past Medical History  Diagnosis Date  . Hypertension   . Borderline mental retardation   . Autism spectrum   . Mood disorder [facility])     Sees Dr Silvio Pate, who prescribes meds  . Anemia     Iron Def  . 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 Hypothyroidism   . HYPOTHYROIDISM 03/12/2009    Qualifier: Diagnosis of  By: Earley Favor MD, [place]    . CKD (chronic kidney disease) stage 3, GFR 30-59 ml/min 01/2015  . GERD (gastroesophageal reflux disease)     Past Surgical History  Procedure Laterality Date  . Cholecystectomy  11/2004    Family Hx:  Family History  Problem Relation Age of Onset  . Hypertension Mother   . Diabetes Maternal Grandmother   . Hypertension Maternal Grandmother   . Heart disease Maternal Grandmother   . Cancer Maternal Grandmother   . Diabetes Maternal Grandfather     Social History:  reports that she has never smoked. She  has never used smokeless tobacco. She reports that she does not drink alcohol or use illicit drugs.  Allergies: No Known Allergies  Medications: Prior to Admission medications   Medication Sig Start Date End Date Taking? Authorizing Provider  amLODipine (NORVASC) 5 MG tablet Take 5 mg by mouth daily. 01/08/15  Yes Historical Provider, MD  ferrous sulfate 325 (65 FE) MG tablet Take 1 tablet (325 mg total) by mouth 2 (two) times daily. 01/07/12  Yes Clovis Cao, MD  levothyroxine (SYNTHROID, LEVOTHROID) 112 MCG tablet TAKE 1 TABLET DAILY BEFORE BREAKFAST. 09/25/14  Yes Ashly M  Gottschalk, DO  mycophenolate (CELLCEPT) 250 MG capsule Take 250 mg by mouth 2 (two) times daily.  02/19/15  Yes Historical Provider, MD  omeprazole (PRILOSEC) 20 MG capsule Take 1 capsule (20 mg total) by mouth daily. 01/27/15  Yes Ashly M Gottschalk, DO  ondansetron (ZOFRAN ODT) 4 MG disintegrating tablet Take 1 tablet (4 mg total) by mouth every 8 (eight) hours as needed for nausea or vomiting. 02/21/15  Yes Ashly M Gottschalk, DO  pravastatin (PRAVACHOL) 20 MG tablet Take 20 mg by mouth daily. 01/08/15  Yes Historical Provider, MD  propranolol (INDERAL) 60 MG tablet Take 1 tablet (60 mg total) by mouth 2 (two) times daily. 09/25/14  Yes Ashly M Gottschalk, DO  ranitidine (ZANTAC) 150 MG tablet TAKE 1 TABLET BY MOUTH ONCE DAILY. 02/14/15  Yes Ashly M Gottschalk, DO  sertraline (ZOLOFT) 100 MG tablet 1 and 1/2 tabs by mouth daily    Yes Historical Provider, MD  LATUDA 40 MG TABS tablet TAKE 1 TABLET DAILY WITH THE EVENING MEAL**PA REQ** 09/28/14   Historical Provider, MD    I have reviewed the patient's current medications.  Labs:  Results for orders placed or performed during the hospital encounter of 03/04/15 (from the past 48 hour(s))  Urinalysis, Routine w reflex microscopic (not at [facility])     Status: Abnormal   Collection Time: 03/04/15  2:36 PM  Result Value Ref Range   Color, Urine YELLOW YELLOW   APPearance HAZY (A) CLEAR   Specific Gravity, Urine 1.035 (H) 1.005 - 1.030   pH 6.0 5.0 - 8.0   Glucose, UA NEGATIVE NEGATIVE mg/dL   Hgb urine dipstick MODERATE (A) NEGATIVE   Bilirubin Urine NEGATIVE NEGATIVE   Ketones, ur NEGATIVE NEGATIVE mg/dL   Protein, ur >300 (A) NEGATIVE mg/dL   Urobilinogen, UA 0.2 0.0 - 1.0 mg/dL   Nitrite NEGATIVE NEGATIVE   Leukocytes, UA NEGATIVE NEGATIVE  Urine microscopic-add on     Status: Abnormal   Collection Time: 03/04/15  2:36 PM  Result Value Ref Range   Squamous Epithelial / LPF MANY (A) RARE   WBC, UA 3-6 <3 WBC/hpf   RBC / HPF 3-6 <3 RBC/hpf    Bacteria, UA FEW (A) RARE     ROS:  A comprehensive review of systems was negative except for: Gastrointestinal: positive for nausea, vomiting and Fatigue  Physical Exam: Filed Vitals:   03/04/15 1200  BP: 116/80  Pulse: 73  Temp: 99.8 F (37.7 C)  Resp: 18     General: Overweight black female who is very pleasant and in no acute distress HEENT: Pupils are equally round and reactive to light, extra motions are intact, mucous members are moist Neck: There is no JVD Heart: Regular rate and rhythm without murmur, gallop, or rub Lungs: Clear to auscultation bilaterally  Abdomen: obese, soft, nontender, nondistended Extremities: No peripheral edema  Skin: Warm and dry  Neuro: Alert and oriented. Slightly slow mentally  Assessment/Plan: 30 year old black female with known severe FSG and advanced CKD. It appears that she is now having clinical uremic symptoms. I have told her the only way to alleviate those would be to initiate dialysis and she is agreeable. There is no acute need for dialysis    1.Renal- advanced CKD due to FSG. He now appears that she's having some chronic indications for dialysis therapy . Of note, her albumin has dropped from 3.6 to 2.0 in the last 3 weeks. She is agreeable to initiate dialysis therapy. However, there are no urgent needs at this time. I would like her to have a PermCath and a fistula placed at the same time. Often, this takes some coordination. I'm going to order vein mapping and called the [facility] to have a surgeon come see her tomorrow and we will look at possibilities for scheduling this dual procedure as an outpatient. I would feel better if we were able to initiate dialysis at least in the next 2 weeks.  I'm also going to stop the CellCept as there does not appear to be a reversible lesion in her kidneys.  I will also check a hepatitis B surface antigen in preparation for initiating dialysis 2. Hypertension/volume  - this does not appear to be an issue at  this time. She is not volume overloaded. 3. Anemia  - fortunately this is not a significant issue at this time either. She does not require initiation with ESA  4. Dispo- patient does not need nor does she want to stay inpatient until we could get all of this arranged and get her started.  Therefore, we will do the vein mapping and hopefully get a surgical consultation and then we can proceed for outpatient dialysis arrangements and initiation   [place] A 03/04/2015, 3:32 PM

## 2015-03-04 NOTE — Assessment & Plan Note (Addendum)
Continued nausea and vomiting, unrelieved by zofran. Has brisk capillary refill and is not tachycardic, without weight loss, so does seem to be adequately hydrating chronically. Abdominal exam benign. Discussed options for further evaluation with patient and her mother today.  - They would like referral to GI. Will enter referral.  - Will also check CMET, CBC, mag, and phos to rule out progression of renal disease as cause for continued nausea/vomiting. - Continue small sips of fluids to stay hydrated - Discussed return/ED precautions including if she is unable to drink at all

## 2015-03-04 NOTE — Progress Notes (Signed)
Spoke with MD Gwendlyn Deutscher per her I paged family medicine teaching service pager. This was not originally list anywhere in chart when patient was directly admitted.

## 2015-03-04 NOTE — H&P (Signed)
[facility] Admission History and Physical Service Pager: [phone]  Patient name: Vanessa Alvarez Medical record number: [id number] Date of birth: Feb 28, 1986 Age: 29 y.o. Gender: female  Primary Care Provider: Ronnie Doss, DO Consultants: Nephrology Code Status: FULL  Chief Complaint: elevated Creatinine   Assessment and Plan: NA Vanessa Alvarez is a 29 y.o. female presenting with worsened renal function and elevation of serum creatinine. 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 No history of NSAIDs use to worsen kidney function. No signs or symptoms of urinary tract infection. Labs on admission significant for Creatinine 7.19, Ca 8.4, Phosphorus 7.1, Albumin 1.4. UA negative for infection, but significant for >300 protein, specific gravity 1.035. - admit to telemetry, FM Dr. Gwendlyn Deutscher - holding Cellcept  - Nephrology consulted, appreciate reccs: no need for acute dialysis; patient does not need to stay inpatient, but will start vein mapping and hopefully get a surgical consultation and then we can proceed for outpatient dialysis arrangements and initiation - repeat labs AM  Microcytic Anemia: Hgb 10.2. (Baseline ~ 10-11).   - home ferrous sulfate  Hypothyroidism:  - continue home Synthroid 156mcg  Mood Disorder:  - continue home Latuda 40mg   - cont Zoloft 100mg    HTN:  - holding Norvasc 5mg   HLD: - cont home Pravastatin  GERD: Home: Prilosec  - cont home Famotidine -  Protonix 40mg   FEN/GI: renal diet Prophylaxis: SQ heparin  Disposition: admit to [facility] teaching service for management of acute worsening of renal function   History of Present Illness:  Vanessa Alvarez is a 29 y.o. female presenting with worsening renal function and acute elevation of creatinine. Patient was seen in [facility] clinic 11/7 where she had lab work done. Lab work  resulted today and was significant for Creatinine elevation to 6.54 from 2.97 last month. PCP consulted with patient's nephrologist who advised patient to be direct admitted for dialysis access and possible dialysis this admission.   Patient and patient's mother provided history. Patient is somewhat poor historian. Notes of some abdominal pain for a few days, but denies constipation or diarrhea. Mother notes of a small amount of BRBPR in the toilet and on tissue paper on 11/6 and 11/7.  Has been having nausea and vomiting for "a couple of months"; has one episode a day, and emesis did not have blood. She has not eaten since 11/7. Notes of some shortness of breath and chest pain of unknown duration but possibly within the past week or so. Mother notes patient had a temp of 100F about 1 week ago, but none since then. Denies dysuria, swelling in extremities, back pain. Of note LMP was Oct 23rd which lasted 5-7 days. Patient has not been using OTC medications including NSAIDs other than Tylenol and Iron supplements. Denies recreational drug use.   No family history of autoimmune diseases including Lupus, DM1, rheumatoid arthritis.  Review Of Systems: Per HPI  Otherwise the remainder of the systems were negative.  Patient Active Problem List   Diagnosis Date Noted  . Nausea with vomiting 02/12/2015  . 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 Chronic kidney disease, stage III (moderate)   . Nystagmus 08/22/2013  . 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 Epigastric discomfort 01/07/2012  . Headache(784.0) 07/13/2011  . Health care maintenance 07/13/2011  . IMPAIRED FASTING GLUCOSE 01/26/2010  . ECZEMA, ATOPIC 11/05/2009  . Hypothyroidism 03/12/2009  . Obesity, Class  II, BMI 35-39.9 02/26/2009  . Essential hypertension 02/26/2009  . Iron deficiency anemia 05/20/2008  . ANXIETY 06/23/2006  . MENTAL RETARDATION 06/23/2006    Past Medical History: Past Medical History  Diagnosis Date  .  Hypertension   . Borderline mental retardation   . Autism spectrum   . Mood disorder [facility])     Sees Dr Silvio Pate, who prescribes meds  . Anemia     Iron Def  . 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 Hypothyroidism   . HYPOTHYROIDISM 03/12/2009    Qualifier: Diagnosis of  By: Earley Favor MD, Cat      Past Surgical History: Past Surgical History  Procedure Laterality Date  . Cholecystectomy  11/2004    Social History: Social History  Substance Use Topics  . Smoking status: Never Smoker   . Smokeless tobacco: Not on file  . Alcohol Use: No   Additional social history: No recreational drugs Please also refer to relevant sections of EMR.  Family History: Family History  Problem Relation Age of Onset  . Hypertension Mother   . Diabetes Maternal Grandmother   . Hypertension Maternal Grandmother   . Heart disease Maternal Grandmother   . Cancer Maternal Grandmother   . Diabetes Maternal Grandfather    Allergies and Medications: No Known Allergies No current facility-administered medications on file prior to encounter.   Current Outpatient Prescriptions on File Prior to Encounter  Medication Sig Dispense Refill  . amLODipine (NORVASC) 5 MG tablet Take 5 mg by mouth daily.  12  . divalproex (DEPAKOTE) 500 MG EC tablet 2 tabs by mouth daily at bedtime     . ferrous sulfate 325 (65 FE) MG tablet Take 1 tablet (325 mg total) by mouth 2 (two) times daily. 180 tablet 3  . LATUDA 40 MG TABS tablet TAKE 1 TABLET DAILY WITH THE EVENING MEAL**PA REQ**  1  . levothyroxine (SYNTHROID, LEVOTHROID) 112 MCG tablet TAKE 1 TABLET DAILY BEFORE BREAKFAST. 30 tablet 7  . mycophenolate (CELLCEPT) 250 MG capsule     . omeprazole (PRILOSEC) 20 MG capsule Take 1 capsule (20 mg total) by mouth daily. 30 capsule 3  . ondansetron (ZOFRAN ODT) 4 MG disintegrating tablet Take 1 tablet (4 mg total) by mouth every 8 (eight) hours as needed for nausea or vomiting. 20 tablet  0  . polyethylene glycol powder (GLYCOLAX/MIRALAX) powder Take 17 g by mouth daily as needed (constipation). 250 g 0  . pravastatin (PRAVACHOL) 20 MG tablet Take 20 mg by mouth daily.  12  . propranolol (INDERAL) 60 MG tablet Take 1 tablet (60 mg total) by mouth 2 (two) times daily. 60 tablet 7  . ranitidine (ZANTAC) 150 MG tablet TAKE 1 TABLET BY MOUTH ONCE DAILY. 60 tablet 7  . sertraline (ZOLOFT) 100 MG tablet 1 and 1/2 tabs by mouth daily     . [DISCONTINUED] triamcinolone (KENALOG) 0.5 % cream Apply to affected area 2 times a day. Dispense 60 grams.       Objective: BP 116/80 mmHg  Pulse 73  Temp(Src) 99.8 F (37.7 C) (Oral)  Resp 18  Ht 5\' 5"  (1.651 m)  Wt 210 lb 5.1 oz (95.4 kg)  BMI 35.00 kg/m2  SpO2 100%  LMP 02/16/2015 (Exact Date) Exam: General: NAD Eyes: EMOI, PERRL Neck: no JVD ENTM: MMM Cardiovascular: RRR, no m/r/g Respiratory: CTAB, no signs of increased work of breathing, no crackles, no wheezing Abdomen: Soft, NT, ND Flank:  no CVA tenderness MSK: trace pitting edema Skin: warm and dry  Neuro: awake, alert, answers questions appropriately  Labs and Imaging: CBC BMET   Recent Labs Lab 03/03/15 1152  WBC 8.4  HGB 11.6*  HCT 36.2  PLT 400    Recent Labs Lab 03/03/15 1152  NA 139  K 4.6  CL 108  CO2 20  BUN 35*  CREATININE 6.54*  GLUCOSE 84  CALCIUM 7.9*      Smiley Houseman, MD 03/04/2015, 1:15 PM PGY-1, [place] Intern pager: [phone], text pages welcome

## 2015-03-04 NOTE — Assessment & Plan Note (Signed)
One, self limited episode of rectal bleeding. Patient refuses rectal exam today so unable to assess for etiology on exam. Given report of straining and hard stool, suspect constipation/hemorrhoids.  - Will check CBC today to ensure stability of Hgb.  - Counseled NOT to use milk of magnesia due to her chronic renal disease. Will instead prescribe miralax to help with bowel movements.

## 2015-03-04 NOTE — Progress Notes (Signed)
[place] and spoke with Kennyth Lose whom confirmed she would page MD Gwendlyn Deutscher to put in hospital orders for patient.

## 2015-03-04 NOTE — Progress Notes (Signed)
Pt had blood present in toilet with hard stool she stated that it was painful to pass, No signs of hemorrhoids present Miralax was given instructed pt to inform if more bloody stools. Will continue to monitor. Arthor Captain LPN

## 2015-03-04 NOTE — Progress Notes (Signed)
[place] again and spoke with Kennyth Lose whom confirmed she has paged MD Gwendlyn Deutscher to put in hospital orders for patient, and will page her again.

## 2015-03-04 NOTE — Telephone Encounter (Signed)
Labs reviewed from yesterday - patient has AKI with doubling of creatinine. Called her nephrologist Dr. Justin Mend to discuss result and formulate plan. Will need direct admission to the hospital for dialysis access and possible dialysis this admission. Has had rapidly progressive FSGS.  Called patient to discuss results. No answer on home line after 2 tries. Called mom's cell phone and on the second attempt was able to reach mother. Explained situation. I will call her back once I have an admission bed arranged.  Have placed a call to bed request - am waiting for them to call me back.  Update - bed request has patient's info and will call mom once telemetry bed is available. (needs tele due to electrolyte abnormalities). Called mom to inform her of this update. Inpatient team notified.  Leeanne Rio, MD

## 2015-03-05 ENCOUNTER — Ambulatory Visit (HOSPITAL_COMMUNITY): Payer: Medicare Other

## 2015-03-05 DIAGNOSIS — N186 End stage renal disease: Secondary | ICD-10-CM

## 2015-03-05 LAB — CBC
HEMATOCRIT: 33 % — AB (ref 36.0–46.0)
Hemoglobin: 10.4 g/dL — ABNORMAL LOW (ref 12.0–15.0)
MCH: 24.9 pg — AB (ref 26.0–34.0)
MCHC: 31.5 g/dL (ref 30.0–36.0)
MCV: 79.1 fL (ref 78.0–100.0)
Platelets: 307 10*3/uL (ref 150–400)
RBC: 4.17 MIL/uL (ref 3.87–5.11)
RDW: 15.6 % — AB (ref 11.5–15.5)
WBC: 6.8 10*3/uL (ref 4.0–10.5)

## 2015-03-05 LAB — BASIC METABOLIC PANEL
ANION GAP: 9 (ref 5–15)
BUN: 35 mg/dL — AB (ref 6–20)
CO2: 20 mmol/L — ABNORMAL LOW (ref 22–32)
Calcium: 8.5 mg/dL — ABNORMAL LOW (ref 8.9–10.3)
Chloride: 110 mmol/L (ref 101–111)
Creatinine, Ser: 7.24 mg/dL — ABNORMAL HIGH (ref 0.44–1.00)
GFR calc Af Amer: 8 mL/min — ABNORMAL LOW (ref >60)
GFR, EST NON AFRICAN AMERICAN: 7 mL/min — AB (ref >60)
Glucose, Bld: 84 mg/dL (ref 65–99)
POTASSIUM: 4.3 mmol/L (ref 3.5–5.1)
SODIUM: 139 mmol/L (ref 135–145)

## 2015-03-05 MED ORDER — POLYETHYLENE GLYCOL 3350 17 G PO PACK: 17.0000 g | PACK | Freq: Every day | ORAL | Status: DC | PRN

## 2015-03-05 NOTE — Consult Note (Signed)
Vascular and [place]  Reason for consult: New access Consulting physician: Dr. Moshe Cipro  History of Present Illness  Vanessa Alvarez is a 29 y.o. (01/08/1986) female who presents for evaluation for permanent access and dialysis catheter placement. The patient is not yet on dialysis. The patient is right hand dominant.  The patient has not had previous access procedures.  Previous central venous cannulation procedures include: none.  The patient has never had a  PPM placed.   She presented to her PCP with two day history of nausea and vomiting recently. Her lab work was significant for elevated creatinine. Her nephrologist was consulted who recommended direct admission. She complains of some nausea today. She denies any loss of appetite or chest discomfort.   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   Past Medical History  Diagnosis Date  . Hypertension   . Borderline mental retardation   . Autism spectrum   . Mood disorder [facility])     Sees Dr Silvio Pate, who prescribes meds  . Anemia     Iron Def  . 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 Hypothyroidism   . HYPOTHYROIDISM 03/12/2009    Qualifier: Diagnosis of  By: Earley Favor MD, Cat    . CKD (chronic kidney disease) stage 3, GFR 30-59 ml/min 01/2015  . GERD (gastroesophageal reflux disease)     Past Surgical History  Procedure Laterality Date  . Cholecystectomy  11/2004    Social History   Social History  . Marital Status: Single    Spouse Name: N/A  . Number of Children: N/A  . Years of Education: N/A   Occupational History  . Not on file.   Social History Main Topics  . Smoking status: Never Smoker   . Smokeless tobacco: Never Used  . Alcohol Use: No  . Drug Use: No  . Sexual Activity: No   Other Topics Concern  . Not on file   Social History Narrative   Lives with mother.   Very sedentary  livestyle.   No etoh, tobacco, drugs, intercourse.      MR.     Family History  Problem Relation Age of Onset  . Hypertension Mother   . Diabetes Maternal Grandmother   . Hypertension Maternal Grandmother   . Heart disease Maternal Grandmother   . Cancer Maternal Grandmother   . Diabetes Maternal Grandfather      No current facility-administered medications on file prior to encounter.   Current Outpatient Prescriptions on File Prior to Encounter  Medication Sig Dispense Refill  . amLODipine (NORVASC) 5 MG tablet Take 5 mg by mouth daily.  12  . ferrous sulfate 325 (65 FE) MG tablet Take 1 tablet (325 mg total) by mouth 2 (two) times daily. 180 tablet 3  . levothyroxine (SYNTHROID, LEVOTHROID) 112 MCG tablet TAKE 1 TABLET DAILY BEFORE BREAKFAST. 30 tablet 7  . mycophenolate (CELLCEPT) 250 MG capsule Take 250 mg by mouth 2 (two) times daily.     Marland Kitchen omeprazole (PRILOSEC) 20 MG capsule Take 1 capsule (20 mg total) by mouth daily. 30 capsule 3  . ondansetron (ZOFRAN ODT) 4 MG disintegrating tablet Take 1 tablet (4 mg total) by mouth every 8 (eight) hours as needed for nausea or vomiting. 20 tablet 0  . pravastatin (PRAVACHOL) 20 MG tablet Take 20 mg by mouth daily.  12  . propranolol (INDERAL) 60 MG  tablet Take 1 tablet (60 mg total) by mouth 2 (two) times daily. 60 tablet 7  . ranitidine (ZANTAC) 150 MG tablet TAKE 1 TABLET BY MOUTH ONCE DAILY. 60 tablet 7  . sertraline (ZOLOFT) 100 MG tablet 1 and 1/2 tabs by mouth daily     . LATUDA 40 MG TABS tablet TAKE 1 TABLET DAILY WITH THE EVENING MEAL**PA REQ**  1  . [DISCONTINUED] triamcinolone (KENALOG) 0.5 % cream Apply to affected area 2 times a day. Dispense 60 grams.       No Known Allergies   REVIEW OF SYSTEMS:  (Positives checked otherwise negative)  CARDIOVASCULAR:  []  chest pain, []  chest pressure, []  palpitations, []  shortness of breath when laying flat, []  shortness of breath with exertion,  []  pain in feet when walking, []   pain in feet when laying flat, []  history of blood clot in veins (DVT), []  history of phlebitis, []  swelling in legs, []  varicose veins  PULMONARY:  []  productive cough, []  asthma, []  wheezing  NEUROLOGIC:  []  weakness in arms or legs, []  numbness in arms or legs, []  difficulty speaking or slurred speech, []  temporary loss of vision in one eye, []  dizziness  HEMATOLOGIC:  []  bleeding problems, []  problems with blood clotting too easily  MUSCULOSKEL:  []  joint pain, []  joint swelling  GASTROINTEST:  []  vomiting blood, []  blood in stool     GENITOURINARY:  []  burning with urination, []  blood in urine  PSYCHIATRIC:  []  history of major depression  INTEGUMENTARY:  []  rashes, []  ulcers  CONSTITUTIONAL:  []  fever, []  chills  Physical Examination  Filed Vitals:   03/04/15 1200 03/04/15 2153 03/05/15 0548  BP: 116/80 131/80 133/82  Pulse: 73 70 69  Temp: 99.8 F (37.7 C) 99 F (37.2 C) 98.7 F (37.1 C)  TempSrc: Oral Oral Oral  Resp: 18 20 18   Height: 5\' 5"  (1.651 m)    Weight: 210 lb 5.1 oz (95.4 kg)  209 lb 7 oz (95 kg)  SpO2: 100% 100% 100%   Body mass index is 34.85 kg/(m^2).  General: A&O x 3, WD Obese female in NAD  Head: [place]/AT  Neck: Supple  Pulmonary: Sym exp, good air movt, CTAB, no rales, rhonchi, & wheezing   Cardiac: RRR, Nl S1, S2, no Murmurs, rubs or gallops  Vascular: Vessel Right Left  Radial Palpable Palpable  Ulnar Palpable Palpable  Brachial Palpable Palpable  Carotid Palpable, without bruit Palpable, without bruit  DP Palpable Palpable    Musculoskeletal: M/S 5/5 throughout, Extremities without ischemic changes.  Neurologic: No focal deficits, Pain and light touch intact in extremities  Psychiatric: Judgment intact, Mood & affect appropriate for pt's clinical situation  Dermatologic: See M/S exam for extremity exam, no rashes otherwise noted  Psychiatric: Judgment intact, Mood & affect appropriate for pt's clinical situation  Lymph : No  Cervical, Axillary, or Inguinal lymphadenopathy    Non-Invasive Vascular Imaging  RUE: acceptable vein conduits include basilic and marginal cephalic  LUE: acceptable vein conduits include cephalic and basilic  Medical Decision Making  Vanessa Alvarez is a 29 y.o. female who presents with advanced CKD secondary to FSG not yet on hemodialysis. She is expected to need dialysis initiation in the next two weeks.    The patient is right handed. She has an acceptable left cephalic vein. Plan for left arm radial-cephalic versus brachial-cephalic AV fistula creation.   I had an extensive discussion with this patient in regards to the nature of access  surgery, including risk, benefits, and alternatives.    The patient is aware that the risks of access surgery include but are not limited to: bleeding, infection, steal syndrome, nerve damage, failure of access to mature, and possible need for additional access procedures in the future.  All questions were answered and the patient is willing to proceed.   We will arrange tunneled dialysis catheter placement and permanent access as an outpatient.   Dr. Bridgett Larsson to evaluate patient.   Virgina Jock, PA-C [facility] [place] Office: [phone] Pager: [phone]  03/05/2015, 9:22 AM   Addendum  I have independently interviewed and examined the patient, and I agree with the physician assistant's findings.  Pt vein mapping appears to be compatible with L RC AVF vs L forearm radiobasilic AVF, L BC AVF, L stage BVT, and possible R RC AVF, and staged R BVT.  I would start with L RC vs BC AVF.  Renal is also requesting TDC placement at that time.  Pt is scheduled to be discharged today, so will get his procedure as an outpatient.  Risk, benefits, and alternatives to access surgery were discussed.  The patient is aware the risks include but are not limited to: bleeding, infection, steal syndrome, nerve damage, ischemic monomelic  neuropathy, failure to mature, need for additional procedures, death and stroke.  The patient is aware the risks of tunneled dialysis catheter placement include but are not limited to: bleeding, infection, central venous injury, pneumothorax, possible venous stenosis, possible malpositioning in the venous system, and possible infections related to long-term catheter presence.  The patient is considering proceed.  She will be scheduled as an outpatient at her discretion.  Adele Barthel, MD [facility] [place] Office: [phone] Pager: [phone]  03/05/2015, 3:29 PM

## 2015-03-05 NOTE — Progress Notes (Signed)
VASCULAR LAB PRELIMINARY  PRELIMINARY  PRELIMINARY  PRELIMINARY  Right  Upper Extremity Vein Map    Cephalic  Segment Diameter Depth Comment  1. Axilla 2.59mm mm   2. Mid upper arm 1.57mm mm   3. Above AC 2.65mm mm   4. In AC 3.31mm mm   5. Below AC 2.58mm mm Courses very medially around the forearm.  6. Mid forearm 2.75mm mm   7. Wrist 3.36mm mm    mm mm    mm mm    mm mm    Basilic  Segment Diameter Depth Comment  1. Axilla mm mm   2. Mid upper arm mm mm   3. Above AC 6.36mm mm   4. In [facility] 6.3mm mm   5. Below AC 5.1mm mm   6. Mid forearm 4.75mm mm   7. Wrist 4.66mm mm    mm mm    mm mm    mm mm    Left Upper Extremity Vein Map    Cephalic  Segment Diameter Depth Comment  1. Axilla 3.31mm mm   2. Mid upper arm 4.12mm mm   3. Above AC 4.10mm mm   4. In AC 6.14mm mm   5. Below AC 3.69mm mm   6. Mid forearm 3.74mm mm   7. Wrist 3.41mm mm    mm mm    mm mm    mm mm    Basilic  Segment Diameter Depth Comment  1. Axilla mm mm   2. Mid upper arm 5.68mm mm   3. Above [facility] 5.91mm mm   4. In AC 4.53mm mm   5. Below AC 3.71mm mm Courses very medially around the forearm.  6. Mid forearm 4.55mm mm   7. Wrist 3.72mm mm    mm mm    mm mm    mm mm     Abrey Bradway, RVT 03/05/2015, 11:14 AM

## 2015-03-05 NOTE — Progress Notes (Signed)
Family Medicine Teaching Service Daily Progress Note Intern Pager: [phone]  Patient name: Vanessa Alvarez Medical record number: [id number] Date of birth: Jul 03, 1985 Age: 29 y.o. Gender: female  Primary Care Provider: Ronnie Doss, DO Consultants: nephrology Code Status: FULL  Pt Overview and Major Events to Date:  11/8: admitted for worsening renal function; patient hemodynamically stable  AAssessment and Plan: Vanessa Alvarez is a 29 y.o. female presenting with worsened renal function and elevation of serum creatinine. 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 No history of NSAIDs use to worsen kidney function. No signs or symptoms of urinary tract infection. Is euvolemic on exam. Labs on admission significant for Creatinine 7.19, Ca 8.4, Phosphorus 7.1, Albumin 1.4. UA negative for infection, but significant for >300 protein, specific gravity 1.035. - admit to telemetry, FM Dr. Gwendlyn Deutscher - holding Cellcept ( per nephrology) - Nephrology consulted, appreciate reccs: no need for acute dialysis, but chronic indications for dialysis therapy; would like to have PermCath and a fistula placed; order vein mapping and called the [facility] to have a surgeon come see her tomorrow and we will look at possibilities for scheduling this dual procedure as an outpatient - Creatinine stable from admission 7.24  Microcytic Anemia: Hgb 10.2>10.4 (Baseline ~ 10-11).  - home ferrous sulfate  Hypothyroidism:  - continue home Synthroid 165mcg  Mood Disorder:  - continue home Latuda 40mg   - cont Zoloft 100mg    HTN: stable  - holding Norvasc 5mg   HLD: - cont home Pravastatin  GERD: Home: Prilosec  - cont home Famotidine - Protonix 40mg   FEN/GI: renal diet Prophylaxis: SQ heparin  Disposition: vein mapping today and possible surgical consultation    Subjective:  - per nursing note, blood  present in toilet with hard stool stated it was painful to pass. Given miralax which improved constipation - no concerns this morning - notes of some nausea this morning which resolved, now eating breakfast  Objective: Temp:  [98.7 F (37.1 C)-99.8 F (37.7 C)] 98.7 F (37.1 C) (11/09 0548) Pulse Rate:  [69-73] 69 (11/09 0548) Resp:  [18-20] 18 (11/09 0548) BP: (116-133)/(80-82) 133/82 mmHg (11/09 0548) SpO2:  [100 %] 100 % (11/09 0548) Weight:  [209 lb 7 oz (95 kg)-210 lb 5.1 oz (95.4 kg)] 209 lb 7 oz (95 kg) (11/09 0548) Physical Exam: General: NAD, sitting eating breakfast  Cardiovascular: RRR, no m/r/g Respiratory: CTAB bilaterally; no signs of increased work of breathing Abdomen: soft, NT, ND Extremities: no LE edema   Laboratory:  Recent Labs Lab 03/03/15 1152 03/04/15 1608 03/05/15 0558  WBC 8.4 6.7 6.8  HGB 11.6* 10.2* 10.4*  HCT 36.2 32.0* 33.0*  PLT 400 334 307    Recent Labs Lab 03/03/15 1152 03/04/15 1608  NA 139 139  K 4.6 5.0  CL 108 110  CO2 20 21*  BUN 35* 34*  CREATININE 6.54* 7.19*  CALCIUM 7.9* 8.4*  PROT 5.3* 6.0*  BILITOT 0.2 0.6  ALKPHOS 85 73  ALT 4* 8*  AST 9* 12*  GLUCOSE 84 108*    Smiley Houseman, MD 03/05/2015, 7:09 AM PGY-1, [place] Intern pager: [phone], text pages welcome

## 2015-03-05 NOTE — Care Management Note (Signed)
Case Management Note  Patient Details  Name: Vanessa Alvarez MRN: [id number] Date of Birth: 09-12-1985  Subjective/Objective:                 Independent patient who lives with parents admitted with AKI secondary to congential kidney disease. Patient will dc medically stable to have HD access placed and begin HD as outpatient.   Action/Plan:  Anticipate DC to home when medically stable, possibly tomorrow. Expected Discharge Date:                  Expected Discharge Plan:  Home/Self Care  In-House Referral:     Discharge planning Services  CM Consult  Post Acute Care Choice:    Choice offered to:     DME Arranged:    DME Agency:     HH Arranged:    HH Agency:     Status of Service:  In process, will continue to follow  Medicare Important Message Given:    Date Medicare IM Given:    Medicare IM give by:    Date Additional Medicare IM Given:    Additional Medicare Important Message give by:     If discussed at [place] of Stay Meetings, dates discussed:    Additional Comments:  Carles Collet, RN 03/05/2015, 2:58 PM

## 2015-03-05 NOTE — Progress Notes (Signed)
Subjective:  No new complaints- eating breakfast- was nauseated earlier but now OK Objective Vital signs in last 24 hours: Filed Vitals:   03/04/15 1200 03/04/15 2153 03/05/15 0548  BP: 116/80 131/80 133/82  Pulse: 73 70 69  Temp: 99.8 F (37.7 C) 99 F (37.2 C) 98.7 F (37.1 C)  TempSrc: Oral Oral Oral  Resp: 18 20 18   Height: 5\' 5"  (1.651 m)    Weight: 95.4 kg (210 lb 5.1 oz)  95 kg (209 lb 7 oz)  SpO2: 100% 100% 100%   Weight change:   Intake/Output Summary (Last 24 hours) at 03/05/15 1121 Last data filed at 03/05/15 0900  Gross per 24 hour  Intake    120 ml  Output      0 ml  Net    120 ml   Assessment/Plan: 29 year old black female with known severe FSG and advanced CKD. It appears that she is now having clinical uremic symptoms. I have told her the only way to alleviate those would be to initiate dialysis and she is agreeable. 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 He now appears that she's having some chronic indications for dialysis therapy . Of note, her albumin has dropped from 3.6 to 2.0 in the last 3 weeks. She is agreeable to initiate dialysis therapy. However, there are no urgent needs at this time. I would like her to have a PermCath and a fistula placed at the same time. Often, this takes some coordination. I have ordered vein mapping and called the [facility] to have a surgeon come see her - appreciate their consult- then to look at possibilities for scheduling this dual procedure as an outpatient. I would feel better if we were able to initiate dialysis at least in the next 2 weeks. I have stopped the CellCept as there does not appear to be a reversible lesion in her kidneys. I will also check a hepatitis B surface antigen in preparation for initiating dialysis 2. Hypertension/volume - this does not appear to be an issue at this time. She is not volume overloaded. 3. Anemia - fortunately this is not a significant issue at this time  either. She does not require initiation with ESA  4. Dispo- patient does not need nor does she want to stay inpatient until we could get all of this arranged and get her started. Therefore, we will do the vein mapping and hopefully get a surgical consultation and then we can proceed for outpatient dialysis arrangements and initiation.  As soon as this happens I will be Ok with discharge home   [place]: Basic Metabolic Panel:  Recent Labs Lab 03/03/15 1152 03/04/15 1608 03/05/15 0558  NA 139 139 139  K 4.6 5.0 4.3  CL 108 110 110  CO2 20 21* 20*  GLUCOSE 84 108* 84  BUN 35* 34* 35*  CREATININE 6.54* 7.19* 7.24*  CALCIUM 7.9* 8.4* 8.5*  PHOS 7.2* 7.1*  --    Liver Function Tests:  Recent Labs Lab 03/03/15 1152 03/04/15 1608  AST 9* 12*  ALT 4* 8*  ALKPHOS 85 73  BILITOT 0.2 0.6  PROT 5.3* 6.0*  ALBUMIN 2.0* 1.4*   No results for input(s): LIPASE, AMYLASE in the last 168 hours. No results for input(s): AMMONIA in the last 168 hours. CBC:  Recent Labs Lab 03/03/15 1152 03/04/15 1608 03/05/15 0558  WBC 8.4 6.7 6.8  HGB 11.6* 10.2* 10.4*  HCT 36.2  32.0* 33.0*  MCV 78.0 78.2 79.1  PLT 400 334 307   Cardiac Enzymes: No results for input(s): CKTOTAL, CKMB, CKMBINDEX, TROPONINI in the last 168 hours. CBG: No results for input(s): GLUCAP in the last 168 hours.  Iron Studies: No results for input(s): IRON, TIBC, TRANSFERRIN, FERRITIN in the last 72 hours. Studies/Results: No results found. Medications: Infusions:    Scheduled Medications: . divalproex  1,000 mg Oral QHS  . famotidine  20 mg Oral Daily  . feeding supplement (NEPRO CARB STEADY)  237 mL Oral BID BM  . ferrous sulfate  325 mg Oral BID  . heparin  5,000 Units Subcutaneous 3 times per day  . levothyroxine  112 mcg Oral QAC breakfast  . lurasidone  40 mg Oral Q breakfast  . pantoprazole  40 mg Oral Daily  . pravastatin  20 mg Oral Daily  . propranolol  60 mg Oral BID  .  sertraline  100 mg Oral Daily  . sodium chloride  3 mL Intravenous Q12H    have reviewed scheduled and prn medications.  Physical Exam: General: NAD- eating breakfast Heart: RRR Lungs: clear Abdomen: obese, soft, non tender Extremities: no edema    03/05/2015,11:21 AM  LOS: 1 day

## 2015-03-05 NOTE — Progress Notes (Signed)
Handed off care to Dacono RN.

## 2015-03-05 NOTE — Discharge Instructions (Signed)
You were seen in the hospital because your kidney markers were elevated. The nephrologists saw you in the hospital and recommended that you start dialysis in the near future. They set up a clinic appointment with surgery for you to get access in order to do dialysis. If you do not hear from your nephrologist or the surgery doctors in the next few days, please contact your nephrologist to let them know so that they are able to assist you with setting up appointments appropriately.

## 2015-03-05 NOTE — Progress Notes (Addendum)
Nutrition Brief Note  Patient identified on the Malnutrition Screening Tool (MST) Report  Wt Readings from Last 15 Encounters:  03/05/15 209 lb 7 oz (95 kg)  03/03/15 210 lb (95.255 kg)  02/21/15 213 lb 8 oz (96.843 kg)  02/13/15 209 lb (94.802 kg)  02/11/15 208 lb (94.348 kg)  01/27/15 216 lb (97.977 kg)  12/11/14 221 lb (100.245 kg)  10/23/14 228 lb 1.6 oz (103.465 kg)  09/25/14 229 lb 1 oz (103.902 kg)  03/04/14 221 lb (100.245 kg)  01/02/14 218 lb (98.884 kg)  10/11/13 222 lb (100.699 kg)  08/22/13 222 lb (100.699 kg)  06/25/13 225 lb 6.4 oz (102.241 kg)  05/10/13 222 lb (100.699 kg)   Vanessa Alvarez is a 29 y.o. female presenting with worsened renal function and elevation of serum creatinine. 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   Spoke with pt and mother at bedside. Pt was visibly upset; crying loudly and expressed anxiety to go home.   Hx obtained mainly by pt mother. She reveals that pt had poor appetite (about 1 meal oer day) for the past month. She also estimates about an 8# wt loss over the past month. She reveals UBW is around 217#.   Pt's appetite has been returning. Mom reports pt ate all of her breakfast this morning because she was "starving". She consumed about 25% of her Nepro shake at bedside.   Nutrition-Focused physical exam completed. Findings are no fat depletion, no muscle depletion, and no edema.   Body mass index is 34.85 kg/(m^2). Patient meets criteria for obesity, class I based on current BMI.   Current diet order is renal/carb modified, patient is consuming approximately 50-85% of meals at this time. Labs and medications reviewed.   No nutrition interventions warranted at this time. If nutrition issues arise, please consult RD.   Khaidyn Staebell A. Jimmye Norman, RD, LDN, CDE Pager: [phone] After hours Pager: [phone]

## 2015-03-05 NOTE — Progress Notes (Signed)
Vanessa Alvarez discharged Home with parents per MD order.  Discharge instructions reviewed and discussed with the patient, all questions and concerns answered. Copy of instructions, care notes for new diagnosis and medications given to patient.    Medication List    STOP taking these medications        mycophenolate 250 MG capsule  Commonly known as:  CELLCEPT      TAKE these medications        amLODipine 5 MG tablet  Commonly known as:  NORVASC  Take 5 mg by mouth daily.     ferrous sulfate 325 (65 FE) MG tablet  Take 1 tablet (325 mg total) by mouth 2 (two) times daily.     LATUDA 40 MG Tabs tablet  Generic drug:  lurasidone  TAKE 1 TABLET DAILY WITH THE EVENING MEAL**PA REQ**     levothyroxine 112 MCG tablet  Commonly known as:  SYNTHROID, LEVOTHROID  TAKE 1 TABLET DAILY BEFORE BREAKFAST.     omeprazole 20 MG capsule  Commonly known as:  PRILOSEC  Take 1 capsule (20 mg total) by mouth daily.     ondansetron 4 MG disintegrating tablet  Commonly known as:  ZOFRAN ODT  Take 1 tablet (4 mg total) by mouth every 8 (eight) hours as needed for nausea or vomiting.     polyethylene glycol packet  Commonly known as:  MIRALAX / GLYCOLAX  Take 17 g by mouth daily as needed for mild constipation.     pravastatin 20 MG tablet  Commonly known as:  PRAVACHOL  Take 20 mg by mouth daily.     propranolol 60 MG tablet  Commonly known as:  INDERAL  Take 1 tablet (60 mg total) by mouth 2 (two) times daily.     ranitidine 150 MG tablet  Commonly known as:  ZANTAC  TAKE 1 TABLET BY MOUTH ONCE DAILY.     sertraline 100 MG tablet  Commonly known as:  ZOLOFT  1 and 1/2 tabs by mouth daily        Patients skin is clean, dry and intact, no evidence of skin break down. IV site discontinued and catheter remains intact. Site without signs and symptoms of complications. Dressing and pressure applied.  Patient escorted to car by NT in a wheelchair,  no distress noted upon  discharge.  Wynetta Emery, Memori Sammon C 03/05/2015 8:08 PM

## 2015-03-06 ENCOUNTER — Other Ambulatory Visit: Payer: Self-pay

## 2015-03-06 LAB — HEPATITIS B SURFACE ANTIGEN: HEP B S AG: NEGATIVE

## 2015-03-06 NOTE — Discharge Summary (Signed)
[facility] Discharge Summary  Patient name: Vanessa Alvarez Medical record number: [id number] Date of birth: 1985/06/22 Age: 29 y.o. 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 Patient usually had one episode of nonbloody emesis a day. Labs were done in clinic and creatinine was found to be elevated at 6.54 from 2.97. Patient was admitted for further evaluation. On admission, abdominal pain had improved and her last episode of emesis was the night prior to admission. Patient denied fevers, change in diet or medication or sick contact. Patient's vitals were stable on admission and patient was clinically euvolemic. There was no acute need for dialysis per nephrology, however patient did have chronic indications for initiation of dialysis therapy. CellCept was discontinued by nephrology as there did not appear to be a reversible lesion in patient's kidneys. Hep B surface antigen was tested in preparation for initiation dialysis, and was negative. Patient had vein mapping during hospitalization, and was seen by vascular surgery to arrange access placement as an  outpatient.   Bright Red Blood Per Rectum: During admission, nursing noted blood small amount of blood present in toilet with hard stool; patient noted it was painful to pass. Miralax was started and patient's constipation improved. Additionally, noted of a small amount of BRBPR in the toilet and on tissue paper on 11/6 and 11/7. Patient declined rectal exam.   Issues for Follow Up:  - Follow up on BRBPR after starting miralax - ensure nausea has not returned  - ensure patient has followed up with surgery and nephrology   Significant Procedures: none  Significant Labs and Imaging:   Recent Labs Lab 03/03/15 1152 03/04/15 1608 03/05/15 0558  WBC 8.4 6.7 6.8  HGB 11.6* 10.2* 10.4*  HCT 36.2 32.0* 33.0*  PLT 400 334 307    Recent Labs Lab 03/03/15 1152 03/04/15 1608 03/05/15 0558  NA 139 139 139  K 4.6 5.0 4.3  CL 108 110 110  CO2 20 21* 20*  GLUCOSE 84 108* 84  BUN 35* 34* 35*  CREATININE 6.54* 7.19* 7.24*  CALCIUM 7.9* 8.4* 8.5*  MG 2.2 2.2  --   PHOS 7.2* 7.1*  --   ALKPHOS 85 73  --   AST 9* 12*  --   ALT 4* 8*  --   ALBUMIN 2.0* 1.4*  --     Results/Tests Pending at Time of Discharge: none  Discharge Medications:    Medication List    STOP taking these medications        mycophenolate 250 MG capsule  Commonly known as:  CELLCEPT      TAKE these medications        amLODipine 5 MG tablet  Commonly known as:  NORVASC  Take 5 mg by mouth daily.  ferrous sulfate 325 (65 FE) MG tablet  Take 1 tablet (325 mg total) by mouth 2 (two) times daily.     LATUDA 40 MG Tabs tablet  Generic drug:  lurasidone  TAKE 1 TABLET DAILY WITH THE EVENING MEAL**PA REQ**     levothyroxine 112 MCG tablet  Commonly known as:  SYNTHROID, LEVOTHROID  TAKE 1 TABLET DAILY BEFORE BREAKFAST.     omeprazole 20 MG capsule  Commonly known as:  PRILOSEC  Take 1 capsule (20 mg total) by mouth daily.     ondansetron 4 MG disintegrating tablet  Commonly known as:  ZOFRAN  ODT  Take 1 tablet (4 mg total) by mouth every 8 (eight) hours as needed for nausea or vomiting.     polyethylene glycol packet  Commonly known as:  MIRALAX / GLYCOLAX  Take 17 g by mouth daily as needed for mild constipation.     pravastatin 20 MG tablet  Commonly known as:  PRAVACHOL  Take 20 mg by mouth daily.     propranolol 60 MG tablet  Commonly known as:  INDERAL  Take 1 tablet (60 mg total) by mouth 2 (two) times daily.     ranitidine 150 MG tablet  Commonly known as:  ZANTAC  TAKE 1 TABLET BY MOUTH ONCE DAILY.     sertraline 100 MG tablet  Commonly known as:  ZOLOFT  1 and 1/2 tabs by mouth daily        Discharge Instructions: Please refer to Patient Instructions section of EMR for full details.  Patient was counseled important signs and symptoms that should prompt return to medical care, changes in medications, dietary instructions, activity restrictions, and follow up appointments.   Follow-Up Appointments:     Follow-up Information    Follow up with Melina Schools, DO On 03/11/2015.   Specialty:  Family Medicine   Why:  at 9 AM for hospital follwo up   Contact information:   [place] [address] [phone]       Smiley Houseman, MD 03/06/2015, 7:23 PM PGY-1, [place]

## 2015-03-07 ENCOUNTER — Emergency Department (HOSPITAL_COMMUNITY): Payer: Medicare Other

## 2015-03-07 ENCOUNTER — Encounter (HOSPITAL_COMMUNITY): Payer: Self-pay | Admitting: Emergency Medicine

## 2015-03-07 ENCOUNTER — Emergency Department (HOSPITAL_COMMUNITY)
Admission: EM | Admit: 2015-03-07 | Discharge: 2015-03-07 | Disposition: A | Discharge status: Home / Self Care | Payer: Medicare Other | Attending: Emergency Medicine | Admitting: Emergency Medicine

## 2015-03-07 ENCOUNTER — Other Ambulatory Visit: Payer: Self-pay | Admitting: *Deleted

## 2015-03-07 DIAGNOSIS — N183 Chronic kidney disease, stage 3 unspecified: Secondary | ICD-10-CM

## 2015-03-07 DIAGNOSIS — K219 Gastro-esophageal reflux disease without esophagitis: Secondary | ICD-10-CM | POA: Insufficient documentation

## 2015-03-07 DIAGNOSIS — Z8659 Personal history of other mental and behavioral disorders: Secondary | ICD-10-CM | POA: Diagnosis not present

## 2015-03-07 DIAGNOSIS — D649 Anemia, unspecified: Secondary | ICD-10-CM | POA: Insufficient documentation

## 2015-03-07 DIAGNOSIS — Y998 Other external cause status: Secondary | ICD-10-CM | POA: Insufficient documentation

## 2015-03-07 DIAGNOSIS — Z79899 Other long term (current) drug therapy: Secondary | ICD-10-CM | POA: Insufficient documentation

## 2015-03-07 DIAGNOSIS — S42401A Unspecified fracture of lower end of right humerus, initial encounter for closed fracture: Secondary | ICD-10-CM

## 2015-03-07 DIAGNOSIS — Y9302 Activity, running: Secondary | ICD-10-CM | POA: Diagnosis not present

## 2015-03-07 DIAGNOSIS — E039 Hypothyroidism, unspecified: Secondary | ICD-10-CM | POA: Diagnosis not present

## 2015-03-07 DIAGNOSIS — Y9289 Other specified places as the place of occurrence of the external cause: Secondary | ICD-10-CM | POA: Diagnosis not present

## 2015-03-07 DIAGNOSIS — S59901A Unspecified injury of right elbow, initial encounter: Secondary | ICD-10-CM | POA: Diagnosis present

## 2015-03-07 DIAGNOSIS — W010XXA Fall on same level from slipping, tripping and stumbling without subsequent striking against object, initial encounter: Secondary | ICD-10-CM | POA: Insufficient documentation

## 2015-03-07 DIAGNOSIS — Z01810 Encounter for preprocedural cardiovascular examination: Secondary | ICD-10-CM

## 2015-03-07 DIAGNOSIS — M25421 Effusion, right elbow: Secondary | ICD-10-CM | POA: Diagnosis not present

## 2015-03-07 DIAGNOSIS — I129 Hypertensive chronic kidney disease with stage 1 through stage 4 chronic kidney disease, or unspecified chronic kidney disease: Secondary | ICD-10-CM | POA: Diagnosis not present

## 2015-03-07 MED ORDER — HYDROCODONE-ACETAMINOPHEN 5-325 MG PO TABS
1.0000 | ORAL_TABLET | Freq: Once | ORAL | Status: AC
  Administered 2015-03-07: 1 via ORAL
  Filled 2015-03-07: qty 1

## 2015-03-07 MED ORDER — IBUPROFEN 600 MG PO TABS: 600.0000 mg | ORAL_TABLET | Freq: Three times a day (TID) | ORAL | Status: DC | PRN

## 2015-03-07 MED ORDER — IBUPROFEN 200 MG PO TABS
600.0000 mg | ORAL_TABLET | Freq: Once | ORAL | Status: DC
  Filled 2015-03-07: qty 3

## 2015-03-07 MED ORDER — HYDROCODONE-ACETAMINOPHEN 5-325 MG PO TABS: 1.0000 | ORAL_TABLET | ORAL | Status: DC | PRN

## 2015-03-07 NOTE — Progress Notes (Signed)
Orthopedic Tech Progress Note Patient Details:  Vanessa Alvarez 1985/12/11 [id number] Applied fiberglass long arm splint to RUE.  Pulses, sensation, motion intact before and after splinting.  Capillary refill less than 2 seconds before and after splinting.  Placed splinted RUE in arm sling. Ortho Devices Type of Ortho Device: Arm sling, Long arm splint Ortho Device/Splint Location: RUE Ortho Device/Splint Interventions: Application   Darrol Poke 03/07/2015, 4:50 AM

## 2015-03-07 NOTE — ED Notes (Signed)
Pt. tripped and fell forward last night and injured her right elbow last night , no LOC / ambulatory , reports pain with mild swelling at right elbow .

## 2015-03-07 NOTE — ED Provider Notes (Signed)
CSN: [id number]   Arrival date & time 03/07/15 0135  History  By signing my name below, I, Altamease Oiler, attest that this documentation has been prepared under the direction and in the presence of Jola Schmidt, MD. Electronically Signed: Altamease Oiler, ED Scribe. 03/07/2015. 2:49 AM.  Chief Complaint  Patient presents with  . Elbow Injury    HPI The history is provided by the patient. No language interpreter was used.   Vanessa Alvarez is a 29 y.o. female with history of MR who presents to the Emergency Department complaining of new and constant right elbow pain with sudden onset approximately 7 hours ago after falling forward. She was running and braced the fall with her arms.  No head injury or LOC. No other injury.    Past Medical History  Diagnosis Date  . Hypertension   . Borderline mental retardation   . Autism spectrum   . Mood disorder [facility])     Sees Dr Silvio Pate, who prescribes meds  . Anemia     Iron Def  . 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 Hypothyroidism   . HYPOTHYROIDISM 03/12/2009    Qualifier: Diagnosis of  By: Earley Favor MD, Cat    . CKD (chronic kidney disease) stage 3, GFR 30-59 ml/min 01/2015  . GERD (gastroesophageal reflux disease)     Past Surgical History  Procedure Laterality Date  . Cholecystectomy  11/2004    Family History  Problem Relation Age of Onset  . Hypertension Mother   . Diabetes Maternal Grandmother   . Hypertension Maternal Grandmother   . Heart disease Maternal Grandmother   . Cancer Maternal Grandmother   . Diabetes Maternal Grandfather     Social History  Substance Use Topics  . Smoking status: Never Smoker   . Smokeless tobacco: Never Used  . Alcohol Use: No     Review of Systems 10 Systems reviewed and all are negative for acute change except as noted in the HPI. Home Medications   Prior to Admission medications   Medication Sig Start Date End Date Taking? Authorizing Provider   amLODipine (NORVASC) 5 MG tablet Take 5 mg by mouth daily. 01/08/15  Yes Historical Provider, MD  ferrous sulfate 325 (65 FE) MG tablet Take 1 tablet (325 mg total) by mouth 2 (two) times daily. 01/07/12  Yes Clovis Cao, MD  LATUDA 40 MG TABS tablet TAKE 1 TABLET DAILY WITH THE EVENING MEAL**PA REQ** 09/28/14  Yes Historical Provider, MD  levothyroxine (SYNTHROID, LEVOTHROID) 112 MCG tablet TAKE 1 TABLET DAILY BEFORE BREAKFAST. 09/25/14  Yes Ashly M Gottschalk, DO  mycophenolate (CELLCEPT) 250 MG capsule Take 250 mg by mouth 2 (two) times daily.   Yes Historical Provider, MD  omeprazole (PRILOSEC) 20 MG capsule Take 1 capsule (20 mg total) by mouth daily. 01/27/15  Yes Ashly M Gottschalk, DO  ondansetron (ZOFRAN ODT) 4 MG disintegrating tablet Take 1 tablet (4 mg total) by mouth every 8 (eight) hours as needed for nausea or vomiting. 02/21/15  Yes Ashly Windell Moulding, DO  polyethylene glycol (MIRALAX / GLYCOLAX) packet Take 17 g by mouth daily as needed for mild constipation. 03/05/15  Yes Smiley Houseman, MD  pravastatin (PRAVACHOL) 20 MG tablet Take 20 mg by mouth daily. 01/08/15  Yes Historical Provider, MD  propranolol (INDERAL) 60 MG tablet Take 1 tablet (60 mg total) by mouth 2 (two) times daily. 09/25/14  Yes [name], DO  ranitidine (ZANTAC) 150 MG tablet TAKE 1 TABLET BY MOUTH ONCE DAILY. 02/14/15  Yes Ashly M Gottschalk, DO  sertraline (ZOLOFT) 100 MG tablet Take 150 mg by mouth daily. 1 and 1/2 tabs by mouth daily   Yes Historical Provider, MD    Allergies  Review of patient's allergies indicates no known allergies.  Triage Vitals: BP 127/92 mmHg  Pulse 82  Temp(Src) 98.7 F (37.1 C) (Oral)  Resp 16  Ht 5\' 4"  (1.626 m)  Wt 210 lb (95.255 kg)  BMI 36.03 kg/m2  SpO2 99%  LMP 02/16/2015 (Exact Date)  Physical Exam  Constitutional: She is oriented to person, place, and time. She appears well-developed and well-nourished. No distress.  HENT:  Head: Normocephalic and  atraumatic.  Eyes: EOM are normal.  Neck: Normal range of motion.  Cardiovascular: Normal rate, regular rhythm and normal heart sounds.   Pulmonary/Chest: Effort normal and breath sounds normal.  Abdominal: Soft. She exhibits no distension. There is no tenderness.  Musculoskeletal: Normal range of motion.  Tenderness at the posterior right elbow No pain with pronation or supination Normal ROM at the right wrist  Neurological: She is alert and oriented to person, place, and time.  Skin: Skin is warm and dry.  Psychiatric: She has a normal mood and affect. Judgment normal.  Nursing note and vitals reviewed.   ED Course  Procedures   DIAGNOSTIC STUDIES: Oxygen Saturation is 99% on RA, normal by my interpretation.    COORDINATION OF CARE: 2:34 AM Discussed treatment plan which includes pain management and right elbow XR with pt at bedside and pt agreed to plan.  Labs Reviewed - No data to display  Imaging Review Dg Elbow Complete Right  03/07/2015  CLINICAL DATA:  Fall landing on right elbow; now with right elbow pain. EXAM: RIGHT ELBOW - COMPLETE 3+ VIEW COMPARISON:  None. FINDINGS: There is an elbow joint effusion. Questionable but not definite cortical step-off of the radial head. No additional fractures seen. Alignment appears maintained, no dislocation. IMPRESSION: Elbow joint effusion, with questionable but not definitive radial head fracture. Electronically Signed   By: Jeb Levering M.D.   On: 03/07/2015 03:12    I personally reviewed and evaluated these images and lab results as a part of my medical decision-making.    MDM   Final diagnoses:  Elbow fracture, right, closed, initial encounter    Likely right radial head fracture.  Patient immobilized in a posterior splint.  Sling.  Orthopedic follow-up.  I personally performed the services described in this documentation, which was scribed in my presence. The recorded information has been reviewed and is accurate.        Jola Schmidt, MD 03/07/15 [phone]

## 2015-03-07 NOTE — Discharge Instructions (Signed)
°Cast or Splint Care  ° ° °Casts and splints support injured limbs and keep bones from moving while they heal. It is important to care for your cast or splint at home.  °HOME CARE INSTRUCTIONS  °Keep the cast or splint uncovered during the drying period. It can take 24 to 48 hours to dry if it is made of plaster. A fiberglass cast will dry in less than 1 hour.  °Do not rest the cast on anything harder than a pillow for the first 24 hours.  °Do not put weight on your injured limb or apply pressure to the cast until your health care provider gives you permission.  °Keep the cast or splint dry. Wet casts or splints can lose their shape and may not support the limb as well. A wet cast that has lost its shape can also create harmful pressure on your skin when it dries. Also, wet skin can become infected.  °Cover the cast or splint with a plastic bag when bathing or when out in the rain or snow. If the cast is on the trunk of the body, take sponge baths until the cast is removed.  °If your cast does become wet, dry it with a towel or a blow dryer on the cool setting only. °Keep your cast or splint clean. Soiled casts may be wiped with a moistened cloth.  °Do not place any hard or soft foreign objects under your cast or splint, such as cotton, toilet paper, lotion, or powder.  °Do not try to scratch the skin under the cast with any object. The object could get stuck inside the cast. Also, scratching could lead to an infection. If itching is a problem, use a blow dryer on a cool setting to relieve discomfort.  °Do not trim or cut your cast or remove padding from inside of it.  °Exercise all joints next to the injury that are not immobilized by the cast or splint. For example, if you have a long leg cast, exercise the hip joint and toes. If you have an arm cast or splint, exercise the shoulder, elbow, thumb, and fingers.  °Elevate your injured arm or leg on 1 or 2 pillows for the first 1 to 3 days to decrease swelling and  pain. It is best if you can comfortably elevate your cast so it is higher than your heart. °SEEK MEDICAL CARE IF:  °Your cast or splint cracks.  °Your cast or splint is too tight or too loose.  °You have unbearable itching inside the cast.  °Your cast becomes wet or develops a soft spot or area.  °You have a bad smell coming from inside your cast.  °You get an object stuck under your cast.  °Your skin around the cast becomes red or raw.  °You have new pain or worsening pain after the cast has been applied. °SEEK IMMEDIATE MEDICAL CARE IF:  °You have fluid leaking through the cast.  °You are unable to move your fingers or toes.  °You have discolored (blue or white), cool, painful, or very swollen fingers or toes beyond the cast.  °You have tingling or numbness around the injured area.  °You have severe pain or pressure under the cast.  °You have any difficulty with your breathing or have shortness of breath.  °You have chest pain. °This information is not intended to replace advice given to you by your health care provider. Make sure you discuss any questions you have with your health care provider.  °  Document Released: 04/09/2000 Document Revised: 01/31/2013 Document Reviewed: 10/19/2012  °Elsevier Interactive Patient Education ©2016 [employer]  ° °

## 2015-03-11 ENCOUNTER — Inpatient Hospital Stay: Payer: Medicare Other | Admitting: Internal Medicine

## 2015-03-13 DIAGNOSIS — F319 Bipolar disorder, unspecified: Secondary | ICD-10-CM | POA: Diagnosis not present

## 2015-03-13 DIAGNOSIS — F71 Moderate intellectual disabilities: Secondary | ICD-10-CM | POA: Diagnosis not present

## 2015-03-14 ENCOUNTER — Telehealth: Payer: Self-pay | Admitting: Family Medicine

## 2015-03-14 ENCOUNTER — Encounter (HOSPITAL_COMMUNITY): Payer: Self-pay | Admitting: *Deleted

## 2015-03-14 DIAGNOSIS — S53401A Unspecified sprain of right elbow, initial encounter: Secondary | ICD-10-CM | POA: Diagnosis not present

## 2015-03-14 DIAGNOSIS — M25521 Pain in right elbow: Secondary | ICD-10-CM | POA: Diagnosis not present

## 2015-03-14 NOTE — Progress Notes (Signed)
SDW-pre-op call completed by pt mother and pt. Mother denies that pt is under the care of a cardiologist. Mother denies that pt had a stress test, echo and cardiac cath. Mother denies that pt had a chest x ray and EKG within the last year. Mother made aware to stop NSAID's ( mobic) ,  otc vitamins and herbal medications. Mother verbalized understanding of all pre-op instructions.

## 2015-03-14 NOTE — Telephone Encounter (Signed)
Patient's mother called because patient has been having black stools since yesterday.  No abdominal pain, no vomiting.  Mother reports patient is supposed to have line placed for HD Monday and was told to call PCP office to discuss black stool.  Advised mother that patient should likely be seen in ED to assess for GI bleed and hemoglobin level.  Mother agreeable.  Virginia Crews, MD, MPH PGY-2,  [facility] 03/14/2015 7:22 PM

## 2015-03-17 ENCOUNTER — Encounter (HOSPITAL_COMMUNITY)
Admission: RE | Disposition: A | Discharge status: Home / Self Care | Payer: Self-pay | Source: Ambulatory Visit | Attending: Vascular Surgery

## 2015-03-17 ENCOUNTER — Ambulatory Visit (HOSPITAL_COMMUNITY): Payer: Medicare Other | Admitting: Emergency Medicine

## 2015-03-17 ENCOUNTER — Encounter (HOSPITAL_COMMUNITY): Payer: Self-pay | Admitting: *Deleted

## 2015-03-17 ENCOUNTER — Ambulatory Visit (HOSPITAL_COMMUNITY)
Admission: RE | Admit: 2015-03-17 | Discharge: 2015-03-17 | Disposition: A | Discharge status: Home / Self Care | Payer: Medicare Other | Source: Ambulatory Visit | Attending: Vascular Surgery | Admitting: Vascular Surgery

## 2015-03-17 DIAGNOSIS — I120 Hypertensive chronic kidney disease with stage 5 chronic kidney disease or end stage renal disease: Secondary | ICD-10-CM | POA: Diagnosis not present

## 2015-03-17 DIAGNOSIS — D509 Iron deficiency anemia, unspecified: Secondary | ICD-10-CM | POA: Diagnosis not present

## 2015-03-17 DIAGNOSIS — N186 End stage renal disease: Secondary | ICD-10-CM | POA: Insufficient documentation

## 2015-03-17 DIAGNOSIS — F840 Autistic disorder: Secondary | ICD-10-CM | POA: Insufficient documentation

## 2015-03-17 DIAGNOSIS — N183 Chronic kidney disease, stage 3 (moderate): Secondary | ICD-10-CM | POA: Diagnosis not present

## 2015-03-17 DIAGNOSIS — R4183 Borderline intellectual functioning: Secondary | ICD-10-CM | POA: Insufficient documentation

## 2015-03-17 DIAGNOSIS — E039 Hypothyroidism, unspecified: Secondary | ICD-10-CM | POA: Insufficient documentation

## 2015-03-17 DIAGNOSIS — K219 Gastro-esophageal reflux disease without esophagitis: Secondary | ICD-10-CM | POA: Insufficient documentation

## 2015-03-17 DIAGNOSIS — N185 Chronic kidney disease, stage 5: Secondary | ICD-10-CM | POA: Diagnosis not present

## 2015-03-17 DIAGNOSIS — F39 Unspecified mood [affective] disorder: Secondary | ICD-10-CM | POA: Diagnosis not present

## 2015-03-17 DIAGNOSIS — Z79899 Other long term (current) drug therapy: Secondary | ICD-10-CM | POA: Diagnosis not present

## 2015-03-17 DIAGNOSIS — N051 Unspecified nephritic syndrome with focal and segmental glomerular lesions: Secondary | ICD-10-CM | POA: Diagnosis not present

## 2015-03-17 HISTORY — PX: AV FISTULA PLACEMENT: SHX1204

## 2015-03-17 HISTORY — DX: Reserved for inherently not codable concepts without codable children: IMO0001

## 2015-03-17 HISTORY — PX: INSERTION OF DIALYSIS CATHETER: SHX1324

## 2015-03-17 LAB — POCT I-STAT 4, (NA,K, GLUC, HGB,HCT)
GLUCOSE: 82 mg/dL (ref 65–99)
HCT: 32 % — ABNORMAL LOW (ref 36.0–46.0)
HEMOGLOBIN: 10.9 g/dL — AB (ref 12.0–15.0)
POTASSIUM: 4.7 mmol/L (ref 3.5–5.1)
Sodium: 138 mmol/L (ref 135–145)

## 2015-03-17 LAB — HCG, SERUM, QUALITATIVE: PREG SERUM: NEGATIVE

## 2015-03-17 SURGERY — ARTERIOVENOUS (AV) FISTULA CREATION
Anesthesia: General | Site: Neck | Laterality: Right

## 2015-03-17 MED ORDER — CHLORHEXIDINE GLUCONATE CLOTH 2 % EX PADS: 6.0000 | MEDICATED_PAD | Freq: Once | CUTANEOUS | Status: DC

## 2015-03-17 MED ORDER — LIDOCAINE HCL (CARDIAC) 20 MG/ML IV SOLN
INTRAVENOUS | Status: AC
  Filled 2015-03-17: qty 5

## 2015-03-17 MED ORDER — EPHEDRINE SULFATE 50 MG/ML IJ SOLN
INTRAMUSCULAR | Status: AC
  Filled 2015-03-17: qty 1

## 2015-03-17 MED ORDER — SODIUM CHLORIDE 0.9 % IJ SOLN
INTRAMUSCULAR | Status: AC
  Filled 2015-03-17: qty 10

## 2015-03-17 MED ORDER — PHENYLEPHRINE 40 MCG/ML (10ML) SYRINGE FOR IV PUSH (FOR BLOOD PRESSURE SUPPORT)
PREFILLED_SYRINGE | INTRAVENOUS | Status: AC
  Filled 2015-03-17: qty 10

## 2015-03-17 MED ORDER — SODIUM CHLORIDE 0.9 % IV SOLN
10.0000 mg | INTRAVENOUS | Status: DC | PRN
  Administered 2015-03-17: 10 ug/min via INTRAVENOUS

## 2015-03-17 MED ORDER — HEPARIN SODIUM (PORCINE) 1000 UNIT/ML IJ SOLN
INTRAMUSCULAR | Status: DC | PRN
  Administered 2015-03-17: 1000 [IU]

## 2015-03-17 MED ORDER — ONDANSETRON HCL 4 MG/2ML IJ SOLN
4.0000 mg | Freq: Once | INTRAMUSCULAR | Status: AC | PRN
  Administered 2015-03-17: 4 mg via INTRAVENOUS

## 2015-03-17 MED ORDER — HEPARIN SODIUM (PORCINE) 1000 UNIT/ML IJ SOLN
INTRAMUSCULAR | Status: AC
  Filled 2015-03-17: qty 1

## 2015-03-17 MED ORDER — HYDROCODONE-ACETAMINOPHEN 5-325 MG PO TABS: 1.0000 | ORAL_TABLET | ORAL | Status: DC | PRN

## 2015-03-17 MED ORDER — SODIUM CHLORIDE 0.9 % IV SOLN: INTRAVENOUS | Status: DC

## 2015-03-17 MED ORDER — ONDANSETRON HCL 4 MG/2ML IJ SOLN
INTRAMUSCULAR | Status: AC
  Administered 2015-03-17: 4 mg via INTRAVENOUS
  Filled 2015-03-17: qty 2

## 2015-03-17 MED ORDER — SODIUM CHLORIDE 0.9 % IV SOLN
INTRAVENOUS | Status: DC | PRN
  Administered 2015-03-17: 08:00:00 via INTRAVENOUS

## 2015-03-17 MED ORDER — SODIUM CHLORIDE 0.9 % IV SOLN
INTRAVENOUS | Status: DC | PRN
  Administered 2015-03-17: 07:00:00

## 2015-03-17 MED ORDER — DEXTROSE 5 % IV SOLN
INTRAVENOUS | Status: AC
  Filled 2015-03-17: qty 1.5

## 2015-03-17 MED ORDER — PHENYLEPHRINE HCL 10 MG/ML IJ SOLN
INTRAMUSCULAR | Status: DC | PRN
  Administered 2015-03-17: 80 ug via INTRAVENOUS
  Administered 2015-03-17: 120 ug via INTRAVENOUS

## 2015-03-17 MED ORDER — SUCCINYLCHOLINE CHLORIDE 20 MG/ML IJ SOLN
INTRAMUSCULAR | Status: AC
  Filled 2015-03-17: qty 1

## 2015-03-17 MED ORDER — LIDOCAINE HCL (PF) 1 % IJ SOLN
INTRAMUSCULAR | Status: DC | PRN
  Administered 2015-03-17: 25 mL

## 2015-03-17 MED ORDER — FENTANYL CITRATE (PF) 250 MCG/5ML IJ SOLN
INTRAMUSCULAR | Status: AC
  Filled 2015-03-17: qty 5

## 2015-03-17 MED ORDER — DEXTROSE 5 % IV SOLN
1.5000 g | INTRAVENOUS | Status: AC
  Administered 2015-03-17: 1.5 g via INTRAVENOUS

## 2015-03-17 MED ORDER — PROPOFOL 10 MG/ML IV BOLUS
INTRAVENOUS | Status: AC
  Filled 2015-03-17: qty 20

## 2015-03-17 MED ORDER — ONDANSETRON HCL 4 MG/2ML IJ SOLN
INTRAMUSCULAR | Status: AC
  Filled 2015-03-17: qty 2

## 2015-03-17 MED ORDER — LIDOCAINE HCL (PF) 1 % IJ SOLN
INTRAMUSCULAR | Status: AC
  Filled 2015-03-17: qty 30

## 2015-03-17 MED ORDER — MIDAZOLAM HCL 5 MG/5ML IJ SOLN
INTRAMUSCULAR | Status: DC | PRN
  Administered 2015-03-17: 2 mg via INTRAVENOUS

## 2015-03-17 MED ORDER — ONDANSETRON HCL 4 MG/2ML IJ SOLN
INTRAMUSCULAR | Status: DC | PRN
  Administered 2015-03-17: 4 mg via INTRAVENOUS

## 2015-03-17 MED ORDER — MEPERIDINE HCL 25 MG/ML IJ SOLN: 6.2500 mg | INTRAMUSCULAR | Status: DC | PRN

## 2015-03-17 MED ORDER — LIDOCAINE HCL (CARDIAC) 10 MG/ML IV SOLN
INTRAVENOUS | Status: DC | PRN
  Administered 2015-03-17: 80 mg via INTRAVENOUS

## 2015-03-17 MED ORDER — HYDROMORPHONE HCL 1 MG/ML IJ SOLN: 0.2500 mg | INTRAMUSCULAR | Status: DC | PRN

## 2015-03-17 MED ORDER — 0.9 % SODIUM CHLORIDE (POUR BTL) OPTIME
TOPICAL | Status: DC | PRN
  Administered 2015-03-17: 1000 mL

## 2015-03-17 MED ORDER — MIDAZOLAM HCL 2 MG/2ML IJ SOLN
INTRAMUSCULAR | Status: AC
  Filled 2015-03-17: qty 2

## 2015-03-17 MED ORDER — PROPOFOL 10 MG/ML IV BOLUS
INTRAVENOUS | Status: DC | PRN
  Administered 2015-03-17: 200 mg via INTRAVENOUS
  Administered 2015-03-17: 20 mg via INTRAVENOUS
  Administered 2015-03-17: 30 mg via INTRAVENOUS

## 2015-03-17 MED ORDER — FENTANYL CITRATE (PF) 100 MCG/2ML IJ SOLN
INTRAMUSCULAR | Status: DC | PRN
  Administered 2015-03-17 (×2): 50 ug via INTRAVENOUS
  Administered 2015-03-17: 25 ug via INTRAVENOUS
  Administered 2015-03-17: 100 ug via INTRAVENOUS
  Administered 2015-03-17 (×2): 50 ug via INTRAVENOUS

## 2015-03-17 MED ORDER — ARTIFICIAL TEARS OP OINT
TOPICAL_OINTMENT | OPHTHALMIC | Status: AC
  Filled 2015-03-17: qty 3.5

## 2015-03-17 MED ORDER — ROCURONIUM BROMIDE 50 MG/5ML IV SOLN
INTRAVENOUS | Status: AC
  Filled 2015-03-17: qty 1

## 2015-03-17 SURGICAL SUPPLY — 66 items
ARMBAND PINK RESTRICT EXTREMIT (MISCELLANEOUS) ×4 IMPLANT
BAG BANDED W/RUBBER/TAPE 36X54 (MISCELLANEOUS) ×2 IMPLANT
BAG DECANTER FOR FLEXI CONT (MISCELLANEOUS) ×4 IMPLANT
BAG EQP BAND 135X91 W/RBR TAPE (MISCELLANEOUS) ×2
BIOPATCH RED 1 DISK 7.0 (GAUZE/BANDAGES/DRESSINGS) ×3 IMPLANT
BIOPATCH RED 1IN DISK 7.0MM (GAUZE/BANDAGES/DRESSINGS) ×1
CANISTER SUCTION 2500CC (MISCELLANEOUS) ×4 IMPLANT
CATH CANNON HEMO 15F 50CM (CATHETERS) IMPLANT
CATH CANNON HEMO 15FR 19 (HEMODIALYSIS SUPPLIES) ×2 IMPLANT
CATH CANNON HEMO 15FR 23CM (HEMODIALYSIS SUPPLIES) ×2 IMPLANT
CATH CANNON HEMO 15FR 31CM (HEMODIALYSIS SUPPLIES) IMPLANT
CATH CANNON HEMO 15FR 32 (HEMODIALYSIS SUPPLIES) IMPLANT
CATH CANNON HEMO 15FR 32CM (HEMODIALYSIS SUPPLIES) IMPLANT
CATH STRAIGHT 5FR 65CM (CATHETERS) IMPLANT
CLIP TI MEDIUM 6 (CLIP) ×4 IMPLANT
CLIP TI WIDE RED SMALL 6 (CLIP) ×4 IMPLANT
COVER DOME SNAP 22 D (MISCELLANEOUS) ×2 IMPLANT
COVER PROBE W GEL 5X96 (DRAPES) ×6 IMPLANT
DECANTER SPIKE VIAL GLASS SM (MISCELLANEOUS) ×4 IMPLANT
DRAPE C-ARM 42X72 X-RAY (DRAPES) ×4 IMPLANT
DRAPE CHEST BREAST 15X10 FENES (DRAPES) ×6 IMPLANT
ELECT REM PT RETURN 9FT ADLT (ELECTROSURGICAL) ×4
ELECTRODE REM PT RTRN 9FT ADLT (ELECTROSURGICAL) ×2 IMPLANT
GAUZE SPONGE 2X2 8PLY STRL LF (GAUZE/BANDAGES/DRESSINGS) ×2 IMPLANT
GAUZE SPONGE 4X4 16PLY XRAY LF (GAUZE/BANDAGES/DRESSINGS) ×2 IMPLANT
GLOVE BIO SURGEON STRL SZ7 (GLOVE) ×8 IMPLANT
GLOVE BIOGEL PI IND STRL 7.0 (GLOVE) IMPLANT
GLOVE BIOGEL PI IND STRL 7.5 (GLOVE) ×2 IMPLANT
GLOVE BIOGEL PI INDICATOR 7.0 (GLOVE) ×10
GLOVE BIOGEL PI INDICATOR 7.5 (GLOVE) ×6
GLOVE ECLIPSE 6.5 STRL STRAW (GLOVE) ×6 IMPLANT
GOWN STRL REUS W/ TWL LRG LVL3 (GOWN DISPOSABLE) ×6 IMPLANT
GOWN STRL REUS W/TWL LRG LVL3 (GOWN DISPOSABLE) ×28
KIT BASIN OR (CUSTOM PROCEDURE TRAY) ×4 IMPLANT
KIT ROOM TURNOVER OR (KITS) ×4 IMPLANT
LIQUID BAND (GAUZE/BANDAGES/DRESSINGS) ×6 IMPLANT
NDL 18GX1X1/2 (RX/OR ONLY) (NEEDLE) ×2 IMPLANT
NDL HYPO 25GX1X1/2 BEV (NEEDLE) ×2 IMPLANT
NEEDLE 18GX1X1/2 (RX/OR ONLY) (NEEDLE) ×8 IMPLANT
NEEDLE HYPO 25GX1X1/2 BEV (NEEDLE) ×4 IMPLANT
NS IRRIG 1000ML POUR BTL (IV SOLUTION) ×4 IMPLANT
PACK CV ACCESS (CUSTOM PROCEDURE TRAY) ×4 IMPLANT
PACK SURGICAL SETUP 50X90 (CUSTOM PROCEDURE TRAY) ×4 IMPLANT
PAD ARMBOARD 7.5X6 YLW CONV (MISCELLANEOUS) ×8 IMPLANT
SET MICROPUNCTURE 5F STIFF (MISCELLANEOUS) IMPLANT
SLEEVE SURGEON STRL (DRAPES) ×2 IMPLANT
SOAP 2 % CHG 4 OZ (WOUND CARE) ×6 IMPLANT
SPONGE GAUZE 2X2 STER 10/PKG (GAUZE/BANDAGES/DRESSINGS) ×2
SPONGE GAUZE 4X4 12PLY STER LF (GAUZE/BANDAGES/DRESSINGS) ×2 IMPLANT
SPONGE SURGIFOAM ABS GEL 100 (HEMOSTASIS) IMPLANT
SUT ETHILON 3 0 PS 1 (SUTURE) ×6 IMPLANT
SUT MNCRL AB 4-0 PS2 18 (SUTURE) ×6 IMPLANT
SUT PROLENE 6 0 BV (SUTURE) IMPLANT
SUT PROLENE 7 0 BV 1 (SUTURE) ×6 IMPLANT
SUT VIC AB 3-0 SH 27 (SUTURE) ×4
SUT VIC AB 3-0 SH 27X BRD (SUTURE) ×2 IMPLANT
SUT VICRYL 4-0 PS2 18IN ABS (SUTURE) ×2 IMPLANT
SYR 20CC LL (SYRINGE) ×6 IMPLANT
SYR 3ML LL SCALE MARK (SYRINGE) ×6 IMPLANT
SYR 5ML LL (SYRINGE) ×4 IMPLANT
SYR CONTROL 10ML LL (SYRINGE) ×4 IMPLANT
SYRINGE 10CC LL (SYRINGE) ×6 IMPLANT
TAPE CLOTH SURG 4X10 WHT LF (GAUZE/BANDAGES/DRESSINGS) ×2 IMPLANT
UNDERPAD 30X30 INCONTINENT (UNDERPADS AND DIAPERS) ×4 IMPLANT
WATER STERILE IRR 1000ML POUR (IV SOLUTION) ×4 IMPLANT
WIRE AMPLATZ SS-J .035X180CM (WIRE) IMPLANT

## 2015-03-17 NOTE — OR Nursing (Signed)
Per Dr. Lianne Moris note,  FINDING(S): 1. Tips of the catheter in the right atrium on fluoroscopy 2. No obvious pneumothorax on fluoroscopy

## 2015-03-17 NOTE — Progress Notes (Signed)
Family reports that patient has not any more black stools.

## 2015-03-17 NOTE — Transfer of Care (Signed)
Immediate Anesthesia Transfer of Care Note  Patient: Vanessa Alvarez  Procedure(s) Performed: Procedure(s): CREATION OF LEFT BRACHIOCEPHALIC ARTERIOVENOUS (AV) FISTULA  (Left) INSERTION OF DIALYSIS CATHETER RIGHT INTERNAL JUIGULAR PLACEMENT (Right)  Patient Location: PACU  Anesthesia Type:General  Level of Consciousness: awake, alert , oriented and patient cooperative  Airway & Oxygen Therapy: Patient Spontanous Breathing and Patient connected to face mask oxygen  Post-op Assessment: Report given to RN, Post -op Vital signs reviewed and stable and Patient moving all extremities X 4  Post vital signs: Reviewed and stable  Last Vitals:  Filed Vitals:   03/17/15 0617  BP: 124/80  Pulse: 81  Temp: 36.9 C  Resp: 16    Complications: No apparent anesthesia complications

## 2015-03-17 NOTE — Anesthesia Preprocedure Evaluation (Signed)
Anesthesia Evaluation  Patient identified by MRN, date of birth, ID band Patient awake    Reviewed: Allergy & Precautions, NPO status , Patient's Chart, lab work & pertinent test results  Airway Mallampati: I  TM Distance: >3 FB Neck ROM: Full    Dental   Pulmonary    Pulmonary exam normal        Cardiovascular hypertension, Pt. on medications Normal cardiovascular exam     Neuro/Psych Anxiety    GI/Hepatic GERD  Medicated and Controlled,  Endo/Other  Hypothyroidism   Renal/GU ESRFRenal disease     Musculoskeletal   Abdominal   Peds  Hematology   Anesthesia Other Findings   Reproductive/Obstetrics                             Anesthesia Physical Anesthesia Plan  ASA: III  Anesthesia Plan: General   Post-op Pain Management:    Induction: Intravenous  Airway Management Planned: LMA  Additional Equipment:   Intra-op Plan:   Post-operative Plan: Extubation in OR  Informed Consent: I have reviewed the patients History and Physical, chart, labs and discussed the procedure including the risks, benefits and alternatives for the proposed anesthesia with the patient or authorized representative who has indicated his/her understanding and acceptance.     Plan Discussed with: CRNA and Surgeon  Anesthesia Plan Comments:         Anesthesia Quick Evaluation

## 2015-03-17 NOTE — Anesthesia Postprocedure Evaluation (Signed)
Anesthesia Post Note  Patient: Vanessa Alvarez  Procedure(s) Performed: Procedure(s) (LRB): CREATION OF LEFT BRACHIOCEPHALIC ARTERIOVENOUS (AV) FISTULA  (Left) INSERTION OF DIALYSIS CATHETER RIGHT INTERNAL JUIGULAR PLACEMENT (Right)  Patient location during evaluation: PACU Anesthesia Type: General Level of consciousness: awake and alert Pain management: pain level controlled Vital Signs Assessment: post-procedure vital signs reviewed and stable Respiratory status: spontaneous breathing, nonlabored ventilation, respiratory function stable and patient connected to nasal cannula oxygen Cardiovascular status: blood pressure returned to baseline and stable Postop Assessment: No signs of nausea or vomiting Anesthetic complications: no    Last Vitals:  Filed Vitals:   03/17/15 1121 03/17/15 1122  BP:  131/95  Pulse: 85 83  Temp:    Resp: 20 20    Last Pain:  Filed Vitals:   03/17/15 1122  PainSc: 0-No pain                 Brittannie Tawney,E. Tanishia Lemaster

## 2015-03-17 NOTE — Anesthesia Procedure Notes (Addendum)
Procedure Name: LMA Insertion Date/Time: 03/17/2015 7:40 AM Performed by: Rogers Blocker Pre-anesthesia Checklist: Patient identified, Emergency Drugs available, Patient being monitored, Suction available and Timeout performed Patient Re-evaluated:Patient Re-evaluated prior to inductionOxygen Delivery Method: Circle system utilized Preoxygenation: Pre-oxygenation with 100% oxygen Intubation Type: IV induction Ventilation: Mask ventilation without difficulty LMA: LMA inserted LMA Size: 4.0 Number of attempts: 1 Placement Confirmation: positive ETCO2,  CO2 detector and breath sounds checked- equal and bilateral Tube secured with: Tape Dental Injury: Teeth and Oropharynx as per pre-operative assessment     0.

## 2015-03-17 NOTE — Op Note (Addendum)
OPERATIVE NOTE  PROCEDURE: 1. Right internal jugular vein tunneled dialysis catheter placement 2. Right internal jugular vein cannulation under ultrasound guidance 3. Right internal jugular vein tunneled dialysis catheter exchange 4. Left upper arm radiocephalic arteriovenous fistula    PRE-OPERATIVE DIAGNOSIS: end-stage renal failure  POST-OPERATIVE DIAGNOSIS: same as above  SURGEON: Adele Barthel, MD  ANESTHESIA: general  ESTIMATED BLOOD LOSS: 30 cc  FINDING(S): 1.  Tips of the catheter in the right atrium on fluoroscopy 2.  No obvious pneumothorax on fluoroscopy  SPECIMEN(S):  none  INDICATIONS:   Vanessa Alvarez is a 29 y.o. female who presents with end stage renal disease.  The patient presents for tunneled dialysis catheter placement and left arteriovenous fistula placement.  The patient is aware the risks of tunneled dialysis catheter placement include but are not limited to: bleeding, infection, central venous injury, pneumothorax, possible venous stenosis, possible malpositioning in the venous system, and possible infections related to long-term catheter presence.  Risk, benefits, and alternatives to access surgery were discussed.  The patient is aware the risks include but are not limited to: bleeding, infection, steal syndrome, nerve damage, ischemic monomelic neuropathy, failure to mature, need for additional procedures, death and stroke.  The patient agrees to proceed forward with the procedure.  DESCRIPTION: After written full informed consent was obtained from the patient, the patient was taken back to the operating room.  Prior to induction, the patient was given IV antibiotics.  After obtaining adequate sedation, the patient was prepped and draped in the standard fashion for a chest or neck tunneled dialysis catheter placement.   The cannulation site, the catheter exit site, and tract for the subcutaneous tunnel were then anesthestized with a total of 20 cc of 1%  lidocaine without epinephrine.  Under ultrasound guidance, the right internal jugular vein was cannulated with the 18 gauge needle.  A J-wire was then placed down into the right ventricle under fluoroscopic guidance.  The wire was then secured in place with a clamp to the drapes.  I then made stab incisions at the neck and exit sites.   I dissected from the exit site to the cannulation site with a metal tunneler.   The subcutaneous tunnel was dilated by passing a plastic dilator over the metal dissector. The wire was then unclamped and I removed the needle.  The skin tract and venotomy was dilated serially with dilators.  Finally, the dilator-sheath was placed under fluoroscopic guidance into the superior vena cava.  The dilator and wire were removed.  A 23 cm Diatek catheter was placed under fluoroscopic guidance down into the right atrium.  The sheath was broken and peeled away while holding the catheter cuff at the level of the skin.  The back end of this catheter was transected, revealing the two lumens of this catheter.  The ports were docked onto these two lumens.  The catheter hub was then screwed into place.  Each port was tested by aspirating and flushing.  No resistance was noted.  Each port was then thoroughly flushed with heparinized saline.  The catheter was secured in placed with two interrupted stitches of 3-0 Nylon tied to the catheter.  The neck incision was closed with a U-stitch of 4-0 Monocryl.  The neck and chest incision were cleaned and sterile bandages applied.  Each port was then loaded with concentrated heparin (1000 Units/mL) at the manufacturer recommended volumes to each port.  Sterile caps were applied to each port.  On completion fluoroscopy,  the tips of the catheter were in the right atrium, and there was no evidence of pneumothorax.  While taking down the drapes and repositioning the patient, it was noted that the patient continued to go into junctional rhythm with neck manipulation.   I had concerns that she was getting conduction system irritation with the 23 cm tunneled dialysis catheter so I felt exchange for a 19 cm tunneled dialysis catheter was necessary.  The patient was reprepped and redraped for a tunneled dialysis catheter exchange.  The prior sutures were all sharply released.  I clamped the prior tunneled dialysis catheter in the neck incisions and then transected the back end of the catheter.  I pull the catheter through the subcutaneous tunnel.  I advanced the J-wire back into the right ventricle and then pulled out the remaining catheter out the right internal jugular vein.  The peel away sheath was reloaded over the wire into the superior vena cava.  I removed the wire.  I loaded the 19 cm tunneled dialysis catheter through the sheath and then peeled away the sheath while holding the catheter in place.  The metal tunneler was advanced back through the subcutaneous tunnel.  The catheter end was docked onto the metal tunneler and delivered through the subcutaneous tunnel while pulling back on the dissector.  The back end of this catheter was transected, revealing the two lumens of this catheter.  The ports were docked onto these two lumens.  The catheter hub was then screwed into place.  Each port was tested by aspirating and flushing.  No resistance was noted.  Each port was then thoroughly flushed with heparinized saline.  The catheter was secured in placed with two interrupted stitches of 3-0 Nylon tied to the catheter.  The neck incision was closed with a U-stitch of 4-0 Monocryl.  The neck and chest incision were cleaned and sterile bandages applied.  Each port was then loaded with concentrated heparin (1000 Units/mL) at the manufacturer recommended volumes to each port.  Sterile caps were applied to each port.  On completion fluoroscopy, the tips of the catheter were in the right atrium, and there was no evidence of pneumothorax.   The drapes were takened down and the  patient repositioned and redraped for a left arm access procedure.  I turned my attention first to identifying the patient's cephalic vein and what I thought was the brachial artery.  Using SonoSite guidance, the location of these vessels were marked out on the skin.   At this point, I injected local anesthetic to obtain a field block of the antecubitum.  In total, I injected about 5 mL of 1% lidocaine without epinephrine.  I made a transverse incision at the level of the antecubitum and dissected through the subcutaneous tissue and fascia to gain exposure of the brachial artery.  This was noted to be 2.5 mm in diameter externally.  Due to the small size, I had concerns this patient had a high bifurcation.  With compression of this vessel, the radial signal disappeared, confirming this was a radial artery.  This was dissected out proximally and distally and controlled with vessel loops .  I then dissected out the cephalic vein.  This was noted to be 3 mm in diameter externally.  The distal segment of the vein was ligated with a  2-0 silk, and the vein was transected.  The proximal segment was interrogated with serial dilators.  The vein accepted up to a 4 mm dilator without any  difficulty.  I then instilled the heparinized saline into the vein and clamped it.  At this point, I reset my exposure of the brachial artery and placed the artery under tension proximally and distally.  I made an arteriotomy with a #11 blade, and then I extended the arteriotomy with a Potts scissor.  I injected heparinized saline proximal and distal to this arteriotomy.  The vein was then sewn to the artery in an end-to-side configuration with a running stitch of 7-0 Prolene.  Prior to completing this anastomosis, I allowed the vein and artery to backbleed.  There was no evidence of clot from any vessels.  I completed the anastomosis in the usual fashion and then released all vessel loops and clamps.  There was a palpable thrill in the  venous outflow, and there was a dopplerable radial pulse.  At this point, I irrigated out the surgical wound.  There was no further active bleeding.  The subcutaneous tissue was reapproximated with a running stitch of 3-0 Vicryl.  The skin was then reapproximated with a running subcuticular stitch of 4-0 Vicryl.  The skin was then cleaned, dried, and reinforced with Dermabond.  The patient tolerated this procedure well.    COMPLICATIONS: none  CONDITION: stable   Adele Barthel, MD [facility] Office: [phone] Pager: [phone]  03/17/2015, 8:24 AM

## 2015-03-17 NOTE — Discharge Instructions (Signed)
° ° °  03/17/2015 SKYLEE HELMKE [id number] [birth year]  Surgeon(s): Conrad Teton, MD  Procedure(s): CREATION OF LEFT UPPER RADIAL CEPHALIC ARTERIOVENOUS (AV) FISTULA  INSERTION OF DIALYSIS CATHETER RIGHT INTERNAL JUIGULAR PLACEMENT  x Do not stick fistula for 12 weeks

## 2015-03-17 NOTE — Interval H&P Note (Signed)
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 After consideration of risks, benefits and other options for treatment, the patient has consented to  Procedure(s): ARTERIOVENOUS (AV) FISTULA CREATION (Left) INSERTION OF DIALYSIS CATHETER (N/A) as a surgical intervention .  The patient's history has been reviewed, patient examined, no change in status, stable for surgery.  I have reviewed the patient's chart and labs.  Questions were answered to the patient's satisfaction.     Adele Barthel

## 2015-03-17 NOTE — H&P (View-Only) (Signed)
[facility] [place]  Reason for consult: New access Consulting physician: Dr. Moshe Cipro  History of Present Illness  Vanessa Alvarez is a 29 y.o. (1986-02-26) female who presents for evaluation for permanent access and dialysis catheter placement. The patient is not yet on dialysis. The patient is right hand dominant.  The patient has not had previous access procedures.  Previous central venous cannulation procedures include: none.  The patient has never had a  PPM placed.   She presented to her PCP with two day history of nausea and vomiting recently. Her lab work was significant for elevated creatinine. Her nephrologist was consulted who recommended direct admission. She complains of some nausea today. She denies any loss of appetite or chest discomfort.   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   Past Medical History  Diagnosis Date  . Hypertension   . Borderline mental retardation   . Autism spectrum   . Mood disorder [facility])     Sees Dr Silvio Pate, who prescribes meds  . Anemia     Iron Def  . 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 Hypothyroidism   . HYPOTHYROIDISM 03/12/2009    Qualifier: Diagnosis of  By: Earley Favor MD, Cat    . CKD (chronic kidney disease) stage 3, GFR 30-59 ml/min 01/2015  . GERD (gastroesophageal reflux disease)     Past Surgical History  Procedure Laterality Date  . Cholecystectomy  11/2004    Social History   Social History  . Marital Status: Single    Spouse Name: N/A  . Number of Children: N/A  . Years of Education: N/A   Occupational History  . Not on file.   Social History Main Topics  . Smoking status: Never Smoker   . Smokeless tobacco: Never Used  . Alcohol Use: No  . Drug Use: No  . Sexual Activity: No   Other Topics Concern  . Not on file   Social History Narrative   Lives with mother.   Very sedentary  livestyle.   No etoh, tobacco, drugs, intercourse.      MR.     Family History  Problem Relation Age of Onset  . Hypertension Mother   . Diabetes Maternal Grandmother   . Hypertension Maternal Grandmother   . Heart disease Maternal Grandmother   . Cancer Maternal Grandmother   . Diabetes Maternal Grandfather      No current facility-administered medications on file prior to encounter.   Current Outpatient Prescriptions on File Prior to Encounter  Medication Sig Dispense Refill  . amLODipine (NORVASC) 5 MG tablet Take 5 mg by mouth daily.  12  . ferrous sulfate 325 (65 FE) MG tablet Take 1 tablet (325 mg total) by mouth 2 (two) times daily. 180 tablet 3  . levothyroxine (SYNTHROID, LEVOTHROID) 112 MCG tablet TAKE 1 TABLET DAILY BEFORE BREAKFAST. 30 tablet 7  . mycophenolate (CELLCEPT) 250 MG capsule Take 250 mg by mouth 2 (two) times daily.     Marland Kitchen omeprazole (PRILOSEC) 20 MG capsule Take 1 capsule (20 mg total) by mouth daily. 30 capsule 3  . ondansetron (ZOFRAN ODT) 4 MG disintegrating tablet Take 1 tablet (4 mg total) by mouth every 8 (eight) hours as needed for nausea or vomiting. 20 tablet 0  . pravastatin (PRAVACHOL) 20 MG tablet Take 20 mg by mouth daily.  12  . propranolol (INDERAL) 60 MG  tablet Take 1 tablet (60 mg total) by mouth 2 (two) times daily. 60 tablet 7  . ranitidine (ZANTAC) 150 MG tablet TAKE 1 TABLET BY MOUTH ONCE DAILY. 60 tablet 7  . sertraline (ZOLOFT) 100 MG tablet 1 and 1/2 tabs by mouth daily     . LATUDA 40 MG TABS tablet TAKE 1 TABLET DAILY WITH THE EVENING MEAL**PA REQ**  1  . [DISCONTINUED] triamcinolone (KENALOG) 0.5 % cream Apply to affected area 2 times a day. Dispense 60 grams.       No Known Allergies   REVIEW OF SYSTEMS:  (Positives checked otherwise negative)  CARDIOVASCULAR:  []  chest pain, []  chest pressure, []  palpitations, []  shortness of breath when laying flat, []  shortness of breath with exertion,  []  pain in feet when walking, []   pain in feet when laying flat, []  history of blood clot in veins (DVT), []  history of phlebitis, []  swelling in legs, []  varicose veins  PULMONARY:  []  productive cough, []  asthma, []  wheezing  NEUROLOGIC:  []  weakness in arms or legs, []  numbness in arms or legs, []  difficulty speaking or slurred speech, []  temporary loss of vision in one eye, []  dizziness  HEMATOLOGIC:  []  bleeding problems, []  problems with blood clotting too easily  MUSCULOSKEL:  []  joint pain, []  joint swelling  GASTROINTEST:  []  vomiting blood, []  blood in stool     GENITOURINARY:  []  burning with urination, []  blood in urine  PSYCHIATRIC:  []  history of major depression  INTEGUMENTARY:  []  rashes, []  ulcers  CONSTITUTIONAL:  []  fever, []  chills  Physical Examination  Filed Vitals:   03/04/15 1200 03/04/15 2153 03/05/15 0548  BP: 116/80 131/80 133/82  Pulse: 73 70 69  Temp: 99.8 F (37.7 C) 99 F (37.2 C) 98.7 F (37.1 C)  TempSrc: Oral Oral Oral  Resp: 18 20 18   Height: 5\' 5"  (1.651 m)    Weight: 210 lb 5.1 oz (95.4 kg)  209 lb 7 oz (95 kg)  SpO2: 100% 100% 100%   Body mass index is 34.85 kg/(m^2).  General: A&O x 3, WD Obese female in NAD  Head: [place]/AT  Neck: Supple  Pulmonary: Sym exp, good air movt, CTAB, no rales, rhonchi, & wheezing   Cardiac: RRR, Nl S1, S2, no Murmurs, rubs or gallops  Vascular: Vessel Right Left  Radial Palpable Palpable  Ulnar Palpable Palpable  Brachial Palpable Palpable  Carotid Palpable, without bruit Palpable, without bruit  DP Palpable Palpable    Musculoskeletal: M/S 5/5 throughout, Extremities without ischemic changes.  Neurologic: No focal deficits, Pain and light touch intact in extremities  Psychiatric: Judgment intact, Mood & affect appropriate for pt's clinical situation  Dermatologic: See M/S exam for extremity exam, no rashes otherwise noted  Psychiatric: Judgment intact, Mood & affect appropriate for pt's clinical situation  Lymph : No  Cervical, Axillary, or Inguinal lymphadenopathy    Non-Invasive Vascular Imaging  RUE: acceptable vein conduits include basilic and marginal cephalic  LUE: acceptable vein conduits include cephalic and basilic  Medical Decision Making  TRAN FARISH is a 29 y.o. female who presents with advanced CKD secondary to FSG not yet on hemodialysis. She is expected to need dialysis initiation in the next two weeks.    The patient is right handed. She has an acceptable left cephalic vein. Plan for left arm radial-cephalic versus brachial-cephalic AV fistula creation.   I had an extensive discussion with this patient in regards to the nature of access  surgery, including risk, benefits, and alternatives.    The patient is aware that the risks of access surgery include but are not limited to: bleeding, infection, steal syndrome, nerve damage, failure of access to mature, and possible need for additional access procedures in the future.  All questions were answered and the patient is willing to proceed.   We will arrange tunneled dialysis catheter placement and permanent access as an outpatient.   Dr. Bridgett Larsson to evaluate patient.   Virgina Jock, PA-C [facility] Office: [phone] Pager: [phone]  03/05/2015, 9:22 AM   Addendum  I have independently interviewed and examined the patient, and I agree with the physician assistant's findings.  Pt vein mapping appears to be compatible with L RC AVF vs L forearm radiobasilic AVF, L BC AVF, L stage BVT, and possible R RC AVF, and staged R BVT.  I would start with L RC vs BC AVF.  Renal is also requesting TDC placement at that time.  Pt is scheduled to be discharged today, so will get his procedure as an outpatient.  Risk, benefits, and alternatives to access surgery were discussed.  The patient is aware the risks include but are not limited to: bleeding, infection, steal syndrome, nerve damage, ischemic monomelic  neuropathy, failure to mature, need for additional procedures, death and stroke.  The patient is aware the risks of tunneled dialysis catheter placement include but are not limited to: bleeding, infection, central venous injury, pneumothorax, possible venous stenosis, possible malpositioning in the venous system, and possible infections related to long-term catheter presence.  The patient is considering proceed.  She will be scheduled as an outpatient at her discretion.  Adele Barthel, MD [facility] Office: [phone] Pager: [phone]  03/05/2015, 3:29 PM

## 2015-03-18 ENCOUNTER — Encounter: Payer: Self-pay | Admitting: Internal Medicine

## 2015-03-18 ENCOUNTER — Telehealth: Payer: Self-pay | Admitting: Vascular Surgery

## 2015-03-18 ENCOUNTER — Ambulatory Visit (INDEPENDENT_AMBULATORY_CARE_PROVIDER_SITE_OTHER): Payer: Medicare Other | Admitting: Internal Medicine

## 2015-03-18 VITALS — BP 130/79 | HR 84 | Temp 99.0°F | Ht 64.0 in | Wt 206.4 lb

## 2015-03-18 DIAGNOSIS — N184 Chronic kidney disease, stage 4 (severe): Secondary | ICD-10-CM

## 2015-03-18 DIAGNOSIS — K625 Hemorrhage of anus and rectum: Secondary | ICD-10-CM | POA: Diagnosis present

## 2015-03-18 DIAGNOSIS — F411 Generalized anxiety disorder: Secondary | ICD-10-CM | POA: Insufficient documentation

## 2015-03-18 DIAGNOSIS — N186 End stage renal disease: Secondary | ICD-10-CM | POA: Diagnosis not present

## 2015-03-18 DIAGNOSIS — D689 Coagulation defect, unspecified: Secondary | ICD-10-CM | POA: Insufficient documentation

## 2015-03-18 NOTE — Telephone Encounter (Signed)
LM for pt re appt, dpm °

## 2015-03-18 NOTE — Assessment & Plan Note (Signed)
Improved. Patient and mother report no further episodes of rectal bleeding since discharge. Given association with straining bowel movements in hospital and resolution since starting Miralax, suspect constipation/hemorrhoids as cause. Patient does have mild anemia, but given she is a menstruating female anemia could be attributed to that. If had return of rectal bleeding, would recommend further work up.

## 2015-03-18 NOTE — Assessment & Plan Note (Addendum)
Patient to begin dialysis today (TTHSat schedule) and will be followed by nephrology.

## 2015-03-18 NOTE — Patient Instructions (Signed)
Thank you for coming to see me today. It was a pleasure. Today we talked about:   Your hospitalization and how you are doing since coming home. I'm glad that you are all set up for dialysis today. Please ask at dialysis about scheduling an appointment with the kidney doctors. If you have constipation, it is safe to increase the amount of Miralax you are taking for a couple days.   Please follow-up with Dr. Lajuana Ripple as needed.   If you have any questions or concerns, please do not hesitate to call the office at [phone].  Take Care,   Phill Myron, DO

## 2015-03-18 NOTE — Progress Notes (Signed)
Subjective:    Vanessa Alvarez - 30 y.o. female MRN [id number]  Date of birth: 06/19/1985  HPI  Vanessa Alvarez is 29 y.o. 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  Advanced CKD 2/2 to FSGS -Right PermCath and left UE fistula placed yesterday (11/21) by vascular surgery  -scheduled to begin dialysis today -dialysis days will be TTHSat -patient denies confusion, dysuria, oliguria, abdominal pain, and N/V -will be followed by nephrology outpatient   BRBPR -incidentally patient found to have 3 episodes of BRB on toilet paper while hospitalized  -stools were painful to pass during hospitalization -patient declined rectal exam during hospitalization  -started on Miralax and have continued since discharge  -denies current constipation or further blood in stool since discharge   -  reports that she has never smoked. She has never used smokeless tobacco. - Review of Systems: Per HPI. - Past Medical History: Patient Active Problem List   Diagnosis Date Noted  . Rectal bleeding 03/04/2015  . 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 ESRD (end stage renal disease) ([place])   . Chronic kidney disease (CKD), stage IV (severe) ([place])   . Thyroid activity decreased   . Essential hypertension, benign   . Nausea with vomiting 02/12/2015  . 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 Chronic kidney disease, stage III (moderate)   . Nystagmus 08/22/2013  . 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 Epigastric discomfort 01/07/2012  . Headache(784.0) 07/13/2011  . Health care maintenance 07/13/2011  . IMPAIRED FASTING GLUCOSE 01/26/2010  . ECZEMA, ATOPIC 11/05/2009  . Hypothyroidism 03/12/2009  . Obesity, Class II, BMI 35-39.9 02/26/2009  . Essential hypertension 02/26/2009  . Iron deficiency anemia 05/20/2008  . ANXIETY 06/23/2006  . MENTAL RETARDATION 06/23/2006   - Medications:  reviewed and updated Current Outpatient Prescriptions  Medication Sig Dispense Refill  . ARIPiprazole (ABILIFY) 5 MG tablet Take 5 mg by mouth at bedtime.    Marland Kitchen amLODipine (NORVASC) 5 MG tablet Take 5 mg by mouth daily.  12  . ferrous sulfate 325 (65 FE) MG tablet Take 1 tablet (325 mg total) by mouth 2 (two) times daily. 180 tablet 3  . HYDROcodone-acetaminophen (NORCO/VICODIN) 5-325 MG tablet Take 1 tablet by mouth every 4 (four) hours as needed for moderate pain. 20 tablet 0  . levothyroxine (SYNTHROID, LEVOTHROID) 112 MCG tablet TAKE 1 TABLET DAILY BEFORE BREAKFAST. 30 tablet 7  . omeprazole (PRILOSEC) 20 MG capsule Take 1 capsule (20 mg total) by mouth daily. 30 capsule 3  . ondansetron (ZOFRAN ODT) 4 MG disintegrating tablet Take 1 tablet (4 mg total) by mouth every 8 (eight) hours as needed for nausea or vomiting. 20 tablet 0  . polyethylene glycol (MIRALAX / GLYCOLAX) packet Take 17 g by mouth daily as needed for mild constipation. 14 each 0  . polyethylene glycol powder (GLYCOLAX/MIRALAX) powder Take 17 g by mouth daily.    . pravastatin (PRAVACHOL) 20 MG tablet Take 20 mg by mouth daily.  12  . propranolol (INDERAL) 60 MG tablet Take 1 tablet (60 mg total) by mouth 2 (two) times daily. 60 tablet 7  . ranitidine (ZANTAC) 150 MG tablet TAKE 1 TABLET BY MOUTH ONCE DAILY. 60 tablet 7  . sertraline (ZOLOFT) 100 MG tablet Take 100 mg by mouth daily. 2 tabs per day    . [DISCONTINUED] triamcinolone (KENALOG) 0.5 % cream Apply to affected area 2 times a day. Dispense 60 grams.  No current facility-administered medications for this visit.       Objective:   Physical Exam BP 130/79 mmHg  Pulse 84  Temp(Src) 99 F (37.2 C) (Oral)  Ht 5\' 4"  (1.626 m)  Wt 206 lb 6.4 oz (93.622 kg)  BMI 35.41 kg/m2  LMP 03/16/2015 Gen: NAD, alert, cooperative with exam, well-appearing CV: RRR, good S1/S2, no murmur, trace pedal and ankle edema in LE bilaterally   Resp: CTABL, no wheezes,  non-labored Abd: SNTND, BS present, no guarding  Skin: healing right chest catheter site and healing left UE AV fistula site both without drainage or bleeding  Neuro: A&Ox3         Assessment & Plan:   Rectal bleeding Improved. Patient and mother report no further episodes of rectal bleeding since discharge. Given association with straining bowel movements in hospital and resolution since starting Miralax, suspect constipation/hemorrhoids as cause. Patient does have mild anemia, but given she is a menstruating female anemia could be attributed to that. If had return of rectal bleeding, would recommend further work up.   Chronic kidney disease (CKD), stage IV (severe) (HCC) Patient to begin dialysis today (TTHSat schedule) and will be followed by nephrology.      Phill Myron, D.O. 03/18/2015, 9:45 AM PGY-1, [place]

## 2015-03-18 NOTE — Telephone Encounter (Signed)
-----   Message from Mena Goes, RN sent at 03/17/2015 10:49 AM EST ----- Regarding: schedule   ----- Message -----    From: Gabriel Earing, PA-C    Sent: 03/17/2015  10:21 AM      To: Vvs Charge Pool  S/p right upper radial cephalic avf [id number].  F/u with Dr. Bridgett Larsson in 4-6 weeks.  Thanks, Aldona Bar

## 2015-03-21 DIAGNOSIS — N186 End stage renal disease: Secondary | ICD-10-CM | POA: Diagnosis not present

## 2015-03-23 DIAGNOSIS — N186 End stage renal disease: Secondary | ICD-10-CM | POA: Diagnosis not present

## 2015-03-25 DIAGNOSIS — N186 End stage renal disease: Secondary | ICD-10-CM | POA: Diagnosis not present

## 2015-03-26 DIAGNOSIS — N186 End stage renal disease: Secondary | ICD-10-CM | POA: Diagnosis not present

## 2015-03-26 DIAGNOSIS — Z992 Dependence on renal dialysis: Secondary | ICD-10-CM | POA: Diagnosis not present

## 2015-03-26 DIAGNOSIS — I158 Other secondary hypertension: Secondary | ICD-10-CM | POA: Diagnosis not present

## 2015-03-27 DIAGNOSIS — D509 Iron deficiency anemia, unspecified: Secondary | ICD-10-CM | POA: Diagnosis not present

## 2015-03-27 DIAGNOSIS — D631 Anemia in chronic kidney disease: Secondary | ICD-10-CM | POA: Diagnosis not present

## 2015-03-27 DIAGNOSIS — Z23 Encounter for immunization: Secondary | ICD-10-CM | POA: Diagnosis not present

## 2015-03-27 DIAGNOSIS — N186 End stage renal disease: Secondary | ICD-10-CM | POA: Diagnosis not present

## 2015-03-27 DIAGNOSIS — N2581 Secondary hyperparathyroidism of renal origin: Secondary | ICD-10-CM | POA: Diagnosis not present

## 2015-03-29 DIAGNOSIS — N2581 Secondary hyperparathyroidism of renal origin: Secondary | ICD-10-CM | POA: Diagnosis not present

## 2015-03-29 DIAGNOSIS — Z23 Encounter for immunization: Secondary | ICD-10-CM | POA: Diagnosis not present

## 2015-03-29 DIAGNOSIS — D631 Anemia in chronic kidney disease: Secondary | ICD-10-CM | POA: Diagnosis not present

## 2015-03-29 DIAGNOSIS — D509 Iron deficiency anemia, unspecified: Secondary | ICD-10-CM | POA: Diagnosis not present

## 2015-03-29 DIAGNOSIS — N186 End stage renal disease: Secondary | ICD-10-CM | POA: Diagnosis not present

## 2015-04-01 DIAGNOSIS — N2581 Secondary hyperparathyroidism of renal origin: Secondary | ICD-10-CM | POA: Diagnosis not present

## 2015-04-01 DIAGNOSIS — D631 Anemia in chronic kidney disease: Secondary | ICD-10-CM | POA: Diagnosis not present

## 2015-04-01 DIAGNOSIS — D509 Iron deficiency anemia, unspecified: Secondary | ICD-10-CM | POA: Diagnosis not present

## 2015-04-01 DIAGNOSIS — Z23 Encounter for immunization: Secondary | ICD-10-CM | POA: Diagnosis not present

## 2015-04-01 DIAGNOSIS — N186 End stage renal disease: Secondary | ICD-10-CM | POA: Diagnosis not present

## 2015-04-03 DIAGNOSIS — D509 Iron deficiency anemia, unspecified: Secondary | ICD-10-CM | POA: Diagnosis not present

## 2015-04-03 DIAGNOSIS — N186 End stage renal disease: Secondary | ICD-10-CM | POA: Diagnosis not present

## 2015-04-03 DIAGNOSIS — Z23 Encounter for immunization: Secondary | ICD-10-CM | POA: Diagnosis not present

## 2015-04-03 DIAGNOSIS — N2581 Secondary hyperparathyroidism of renal origin: Secondary | ICD-10-CM | POA: Diagnosis not present

## 2015-04-03 DIAGNOSIS — D631 Anemia in chronic kidney disease: Secondary | ICD-10-CM | POA: Diagnosis not present

## 2015-04-04 ENCOUNTER — Other Ambulatory Visit (HOSPITAL_COMMUNITY): Payer: Medicare Other

## 2015-04-04 ENCOUNTER — Ambulatory Visit: Payer: Medicare Other | Admitting: Vascular Surgery

## 2015-04-04 ENCOUNTER — Encounter (HOSPITAL_COMMUNITY): Payer: Medicare Other

## 2015-04-05 DIAGNOSIS — Z23 Encounter for immunization: Secondary | ICD-10-CM | POA: Diagnosis not present

## 2015-04-05 DIAGNOSIS — N186 End stage renal disease: Secondary | ICD-10-CM | POA: Diagnosis not present

## 2015-04-05 DIAGNOSIS — D509 Iron deficiency anemia, unspecified: Secondary | ICD-10-CM | POA: Diagnosis not present

## 2015-04-05 DIAGNOSIS — D631 Anemia in chronic kidney disease: Secondary | ICD-10-CM | POA: Diagnosis not present

## 2015-04-05 DIAGNOSIS — N2581 Secondary hyperparathyroidism of renal origin: Secondary | ICD-10-CM | POA: Diagnosis not present

## 2015-04-08 DIAGNOSIS — N186 End stage renal disease: Secondary | ICD-10-CM | POA: Diagnosis not present

## 2015-04-08 DIAGNOSIS — D631 Anemia in chronic kidney disease: Secondary | ICD-10-CM | POA: Diagnosis not present

## 2015-04-08 DIAGNOSIS — D509 Iron deficiency anemia, unspecified: Secondary | ICD-10-CM | POA: Diagnosis not present

## 2015-04-08 DIAGNOSIS — N2581 Secondary hyperparathyroidism of renal origin: Secondary | ICD-10-CM | POA: Diagnosis not present

## 2015-04-08 DIAGNOSIS — Z23 Encounter for immunization: Secondary | ICD-10-CM | POA: Diagnosis not present

## 2015-04-10 DIAGNOSIS — F71 Moderate intellectual disabilities: Secondary | ICD-10-CM | POA: Diagnosis not present

## 2015-04-10 DIAGNOSIS — F319 Bipolar disorder, unspecified: Secondary | ICD-10-CM | POA: Diagnosis not present

## 2015-04-10 DIAGNOSIS — N2581 Secondary hyperparathyroidism of renal origin: Secondary | ICD-10-CM | POA: Diagnosis not present

## 2015-04-10 DIAGNOSIS — D631 Anemia in chronic kidney disease: Secondary | ICD-10-CM | POA: Diagnosis not present

## 2015-04-10 DIAGNOSIS — N186 End stage renal disease: Secondary | ICD-10-CM | POA: Diagnosis not present

## 2015-04-10 DIAGNOSIS — Z23 Encounter for immunization: Secondary | ICD-10-CM | POA: Diagnosis not present

## 2015-04-10 DIAGNOSIS — D509 Iron deficiency anemia, unspecified: Secondary | ICD-10-CM | POA: Diagnosis not present

## 2015-04-12 DIAGNOSIS — D631 Anemia in chronic kidney disease: Secondary | ICD-10-CM | POA: Diagnosis not present

## 2015-04-12 DIAGNOSIS — Z23 Encounter for immunization: Secondary | ICD-10-CM | POA: Diagnosis not present

## 2015-04-12 DIAGNOSIS — D509 Iron deficiency anemia, unspecified: Secondary | ICD-10-CM | POA: Diagnosis not present

## 2015-04-12 DIAGNOSIS — N186 End stage renal disease: Secondary | ICD-10-CM | POA: Diagnosis not present

## 2015-04-12 DIAGNOSIS — N2581 Secondary hyperparathyroidism of renal origin: Secondary | ICD-10-CM | POA: Diagnosis not present

## 2015-04-14 ENCOUNTER — Ambulatory Visit: Payer: Medicare Other | Admitting: Family Medicine

## 2015-04-15 DIAGNOSIS — Z23 Encounter for immunization: Secondary | ICD-10-CM | POA: Diagnosis not present

## 2015-04-15 DIAGNOSIS — D509 Iron deficiency anemia, unspecified: Secondary | ICD-10-CM | POA: Diagnosis not present

## 2015-04-15 DIAGNOSIS — N186 End stage renal disease: Secondary | ICD-10-CM | POA: Diagnosis not present

## 2015-04-15 DIAGNOSIS — D631 Anemia in chronic kidney disease: Secondary | ICD-10-CM | POA: Diagnosis not present

## 2015-04-15 DIAGNOSIS — N2581 Secondary hyperparathyroidism of renal origin: Secondary | ICD-10-CM | POA: Diagnosis not present

## 2015-04-16 ENCOUNTER — Encounter: Payer: Self-pay | Admitting: Vascular Surgery

## 2015-04-19 DIAGNOSIS — N2581 Secondary hyperparathyroidism of renal origin: Secondary | ICD-10-CM | POA: Diagnosis not present

## 2015-04-19 DIAGNOSIS — N186 End stage renal disease: Secondary | ICD-10-CM | POA: Diagnosis not present

## 2015-04-19 DIAGNOSIS — Z23 Encounter for immunization: Secondary | ICD-10-CM | POA: Diagnosis not present

## 2015-04-19 DIAGNOSIS — D509 Iron deficiency anemia, unspecified: Secondary | ICD-10-CM | POA: Diagnosis not present

## 2015-04-19 DIAGNOSIS — D631 Anemia in chronic kidney disease: Secondary | ICD-10-CM | POA: Diagnosis not present

## 2015-04-22 DIAGNOSIS — D509 Iron deficiency anemia, unspecified: Secondary | ICD-10-CM | POA: Diagnosis not present

## 2015-04-22 DIAGNOSIS — D631 Anemia in chronic kidney disease: Secondary | ICD-10-CM | POA: Diagnosis not present

## 2015-04-22 DIAGNOSIS — Z23 Encounter for immunization: Secondary | ICD-10-CM | POA: Diagnosis not present

## 2015-04-22 DIAGNOSIS — N186 End stage renal disease: Secondary | ICD-10-CM | POA: Diagnosis not present

## 2015-04-22 DIAGNOSIS — N2581 Secondary hyperparathyroidism of renal origin: Secondary | ICD-10-CM | POA: Diagnosis not present

## 2015-04-24 DIAGNOSIS — Z23 Encounter for immunization: Secondary | ICD-10-CM | POA: Diagnosis not present

## 2015-04-24 DIAGNOSIS — D509 Iron deficiency anemia, unspecified: Secondary | ICD-10-CM | POA: Diagnosis not present

## 2015-04-24 DIAGNOSIS — D631 Anemia in chronic kidney disease: Secondary | ICD-10-CM | POA: Diagnosis not present

## 2015-04-24 DIAGNOSIS — N2581 Secondary hyperparathyroidism of renal origin: Secondary | ICD-10-CM | POA: Diagnosis not present

## 2015-04-24 DIAGNOSIS — N186 End stage renal disease: Secondary | ICD-10-CM | POA: Diagnosis not present

## 2015-04-25 ENCOUNTER — Encounter: Payer: Self-pay | Admitting: Vascular Surgery

## 2015-04-25 ENCOUNTER — Ambulatory Visit (INDEPENDENT_AMBULATORY_CARE_PROVIDER_SITE_OTHER): Payer: Self-pay | Admitting: Vascular Surgery

## 2015-04-25 DIAGNOSIS — Z992 Dependence on renal dialysis: Secondary | ICD-10-CM

## 2015-04-25 DIAGNOSIS — N186 End stage renal disease: Secondary | ICD-10-CM

## 2015-04-25 NOTE — Progress Notes (Signed)
    Postoperative Access Visit   History of Present Illness  Vanessa Alvarez is a 29 y.o. year old female who presents for postoperative follow-up for: RIJV TDC placement, LUA RC AVF (Date: 03/17/15).  The patient's wounds are healed.  The patient notes no steal symptoms.  The patient is able to complete their activities of daily living.  The patient's current symptoms are: none.  For VQI Use Only  PRE-ADM LIVING: Home  AMB STATUS: Ambulatory  Physical Examination Filed Vitals:   04/25/15 0856  BP: 111/74  Pulse: 87  Temp: 98.5 F (36.9 C)    LUE: Incision is healed, skin feels warm, hand grip is 5/5, sensation in digits is  intact, palpable thrill, bruit can  be auscultated , on Sonosite: fistula > 6 mm throughout, several large side-branches noted  Medical Decision Making  Vanessa Alvarez is a 29 y.o. year old female who presents s/p RIJV TDC, LUA RC AVF.   The patient's access is ready for use.  The patient's tunneled dialysis catheter can be removed after two successful cannulations and completed dialysis treatments.  Thank you for allowing [place] to participate in this patient's care.  Adele Barthel, MD [facility] Office: [phone] Pager: [phone]  04/25/2015, 9:22 AM

## 2015-04-26 DIAGNOSIS — N186 End stage renal disease: Secondary | ICD-10-CM | POA: Diagnosis not present

## 2015-04-26 DIAGNOSIS — Z23 Encounter for immunization: Secondary | ICD-10-CM | POA: Diagnosis not present

## 2015-04-26 DIAGNOSIS — Z992 Dependence on renal dialysis: Secondary | ICD-10-CM | POA: Diagnosis not present

## 2015-04-26 DIAGNOSIS — N2581 Secondary hyperparathyroidism of renal origin: Secondary | ICD-10-CM | POA: Diagnosis not present

## 2015-04-26 DIAGNOSIS — I158 Other secondary hypertension: Secondary | ICD-10-CM | POA: Diagnosis not present

## 2015-04-26 DIAGNOSIS — D631 Anemia in chronic kidney disease: Secondary | ICD-10-CM | POA: Diagnosis not present

## 2015-04-26 DIAGNOSIS — D509 Iron deficiency anemia, unspecified: Secondary | ICD-10-CM | POA: Diagnosis not present

## 2015-04-29 DIAGNOSIS — N2581 Secondary hyperparathyroidism of renal origin: Secondary | ICD-10-CM | POA: Diagnosis not present

## 2015-04-29 DIAGNOSIS — Z23 Encounter for immunization: Secondary | ICD-10-CM | POA: Diagnosis not present

## 2015-04-29 DIAGNOSIS — D509 Iron deficiency anemia, unspecified: Secondary | ICD-10-CM | POA: Diagnosis not present

## 2015-04-29 DIAGNOSIS — N186 End stage renal disease: Secondary | ICD-10-CM | POA: Diagnosis not present

## 2015-04-29 DIAGNOSIS — D631 Anemia in chronic kidney disease: Secondary | ICD-10-CM | POA: Diagnosis not present

## 2015-05-01 DIAGNOSIS — N2581 Secondary hyperparathyroidism of renal origin: Secondary | ICD-10-CM | POA: Diagnosis not present

## 2015-05-01 DIAGNOSIS — D509 Iron deficiency anemia, unspecified: Secondary | ICD-10-CM | POA: Diagnosis not present

## 2015-05-01 DIAGNOSIS — N186 End stage renal disease: Secondary | ICD-10-CM | POA: Diagnosis not present

## 2015-05-01 DIAGNOSIS — D631 Anemia in chronic kidney disease: Secondary | ICD-10-CM | POA: Diagnosis not present

## 2015-05-01 DIAGNOSIS — Z23 Encounter for immunization: Secondary | ICD-10-CM | POA: Diagnosis not present

## 2015-05-03 DIAGNOSIS — D631 Anemia in chronic kidney disease: Secondary | ICD-10-CM | POA: Diagnosis not present

## 2015-05-03 DIAGNOSIS — D509 Iron deficiency anemia, unspecified: Secondary | ICD-10-CM | POA: Diagnosis not present

## 2015-05-03 DIAGNOSIS — Z23 Encounter for immunization: Secondary | ICD-10-CM | POA: Diagnosis not present

## 2015-05-03 DIAGNOSIS — N2581 Secondary hyperparathyroidism of renal origin: Secondary | ICD-10-CM | POA: Diagnosis not present

## 2015-05-03 DIAGNOSIS — N186 End stage renal disease: Secondary | ICD-10-CM | POA: Diagnosis not present

## 2015-05-06 DIAGNOSIS — D631 Anemia in chronic kidney disease: Secondary | ICD-10-CM | POA: Diagnosis not present

## 2015-05-06 DIAGNOSIS — N2581 Secondary hyperparathyroidism of renal origin: Secondary | ICD-10-CM | POA: Diagnosis not present

## 2015-05-06 DIAGNOSIS — Z23 Encounter for immunization: Secondary | ICD-10-CM | POA: Diagnosis not present

## 2015-05-06 DIAGNOSIS — D509 Iron deficiency anemia, unspecified: Secondary | ICD-10-CM | POA: Diagnosis not present

## 2015-05-06 DIAGNOSIS — N186 End stage renal disease: Secondary | ICD-10-CM | POA: Diagnosis not present

## 2015-05-08 DIAGNOSIS — N2581 Secondary hyperparathyroidism of renal origin: Secondary | ICD-10-CM | POA: Diagnosis not present

## 2015-05-08 DIAGNOSIS — D631 Anemia in chronic kidney disease: Secondary | ICD-10-CM | POA: Diagnosis not present

## 2015-05-08 DIAGNOSIS — D509 Iron deficiency anemia, unspecified: Secondary | ICD-10-CM | POA: Diagnosis not present

## 2015-05-08 DIAGNOSIS — N186 End stage renal disease: Secondary | ICD-10-CM | POA: Diagnosis not present

## 2015-05-08 DIAGNOSIS — Z23 Encounter for immunization: Secondary | ICD-10-CM | POA: Diagnosis not present

## 2015-05-10 DIAGNOSIS — Z23 Encounter for immunization: Secondary | ICD-10-CM | POA: Diagnosis not present

## 2015-05-10 DIAGNOSIS — D631 Anemia in chronic kidney disease: Secondary | ICD-10-CM | POA: Diagnosis not present

## 2015-05-10 DIAGNOSIS — N186 End stage renal disease: Secondary | ICD-10-CM | POA: Diagnosis not present

## 2015-05-10 DIAGNOSIS — N2581 Secondary hyperparathyroidism of renal origin: Secondary | ICD-10-CM | POA: Diagnosis not present

## 2015-05-10 DIAGNOSIS — D509 Iron deficiency anemia, unspecified: Secondary | ICD-10-CM | POA: Diagnosis not present

## 2015-05-13 DIAGNOSIS — N186 End stage renal disease: Secondary | ICD-10-CM | POA: Diagnosis not present

## 2015-05-13 DIAGNOSIS — D509 Iron deficiency anemia, unspecified: Secondary | ICD-10-CM | POA: Diagnosis not present

## 2015-05-13 DIAGNOSIS — Z23 Encounter for immunization: Secondary | ICD-10-CM | POA: Diagnosis not present

## 2015-05-13 DIAGNOSIS — D631 Anemia in chronic kidney disease: Secondary | ICD-10-CM | POA: Diagnosis not present

## 2015-05-13 DIAGNOSIS — N2581 Secondary hyperparathyroidism of renal origin: Secondary | ICD-10-CM | POA: Diagnosis not present

## 2015-05-15 DIAGNOSIS — N189 Chronic kidney disease, unspecified: Secondary | ICD-10-CM | POA: Diagnosis not present

## 2015-05-15 DIAGNOSIS — N051 Unspecified nephritic syndrome with focal and segmental glomerular lesions: Secondary | ICD-10-CM | POA: Diagnosis not present

## 2015-05-16 DIAGNOSIS — Z23 Encounter for immunization: Secondary | ICD-10-CM | POA: Diagnosis not present

## 2015-05-16 DIAGNOSIS — D631 Anemia in chronic kidney disease: Secondary | ICD-10-CM | POA: Diagnosis not present

## 2015-05-16 DIAGNOSIS — N2581 Secondary hyperparathyroidism of renal origin: Secondary | ICD-10-CM | POA: Diagnosis not present

## 2015-05-16 DIAGNOSIS — D509 Iron deficiency anemia, unspecified: Secondary | ICD-10-CM | POA: Diagnosis not present

## 2015-05-16 DIAGNOSIS — N186 End stage renal disease: Secondary | ICD-10-CM | POA: Diagnosis not present

## 2015-05-17 DIAGNOSIS — N2581 Secondary hyperparathyroidism of renal origin: Secondary | ICD-10-CM | POA: Diagnosis not present

## 2015-05-17 DIAGNOSIS — Z23 Encounter for immunization: Secondary | ICD-10-CM | POA: Diagnosis not present

## 2015-05-17 DIAGNOSIS — D509 Iron deficiency anemia, unspecified: Secondary | ICD-10-CM | POA: Diagnosis not present

## 2015-05-17 DIAGNOSIS — D631 Anemia in chronic kidney disease: Secondary | ICD-10-CM | POA: Diagnosis not present

## 2015-05-17 DIAGNOSIS — N186 End stage renal disease: Secondary | ICD-10-CM | POA: Diagnosis not present

## 2015-05-20 DIAGNOSIS — Z23 Encounter for immunization: Secondary | ICD-10-CM | POA: Diagnosis not present

## 2015-05-20 DIAGNOSIS — D509 Iron deficiency anemia, unspecified: Secondary | ICD-10-CM | POA: Diagnosis not present

## 2015-05-20 DIAGNOSIS — N186 End stage renal disease: Secondary | ICD-10-CM | POA: Diagnosis not present

## 2015-05-20 DIAGNOSIS — D631 Anemia in chronic kidney disease: Secondary | ICD-10-CM | POA: Diagnosis not present

## 2015-05-20 DIAGNOSIS — N2581 Secondary hyperparathyroidism of renal origin: Secondary | ICD-10-CM | POA: Diagnosis not present

## 2015-05-22 DIAGNOSIS — N2581 Secondary hyperparathyroidism of renal origin: Secondary | ICD-10-CM | POA: Diagnosis not present

## 2015-05-22 DIAGNOSIS — D631 Anemia in chronic kidney disease: Secondary | ICD-10-CM | POA: Diagnosis not present

## 2015-05-22 DIAGNOSIS — Z23 Encounter for immunization: Secondary | ICD-10-CM | POA: Diagnosis not present

## 2015-05-22 DIAGNOSIS — D509 Iron deficiency anemia, unspecified: Secondary | ICD-10-CM | POA: Diagnosis not present

## 2015-05-22 DIAGNOSIS — N186 End stage renal disease: Secondary | ICD-10-CM | POA: Diagnosis not present

## 2015-05-24 DIAGNOSIS — N186 End stage renal disease: Secondary | ICD-10-CM | POA: Diagnosis not present

## 2015-05-24 DIAGNOSIS — N2581 Secondary hyperparathyroidism of renal origin: Secondary | ICD-10-CM | POA: Diagnosis not present

## 2015-05-24 DIAGNOSIS — D631 Anemia in chronic kidney disease: Secondary | ICD-10-CM | POA: Diagnosis not present

## 2015-05-24 DIAGNOSIS — Z23 Encounter for immunization: Secondary | ICD-10-CM | POA: Diagnosis not present

## 2015-05-24 DIAGNOSIS — D509 Iron deficiency anemia, unspecified: Secondary | ICD-10-CM | POA: Diagnosis not present

## 2015-05-26 ENCOUNTER — Ambulatory Visit (INDEPENDENT_AMBULATORY_CARE_PROVIDER_SITE_OTHER): Payer: Medicare Other | Admitting: Internal Medicine

## 2015-05-26 ENCOUNTER — Encounter: Payer: Self-pay | Admitting: Internal Medicine

## 2015-05-26 ENCOUNTER — Ambulatory Visit: Payer: Medicare Other | Admitting: Family Medicine

## 2015-05-26 VITALS — BP 122/80 | HR 103 | Temp 98.9°F | Ht 64.0 in | Wt 184.0 lb

## 2015-05-26 DIAGNOSIS — E785 Hyperlipidemia, unspecified: Secondary | ICD-10-CM

## 2015-05-26 DIAGNOSIS — R7309 Other abnormal glucose: Secondary | ICD-10-CM | POA: Diagnosis not present

## 2015-05-26 DIAGNOSIS — L249 Irritant contact dermatitis, unspecified cause: Secondary | ICD-10-CM | POA: Insufficient documentation

## 2015-05-26 LAB — POCT GLYCOSYLATED HEMOGLOBIN (HGB A1C): HEMOGLOBIN A1C: 4.6

## 2015-05-26 LAB — LIPID PANEL
Cholesterol: 179 mg/dL (ref 125–200)
HDL: 61 mg/dL (ref >=46)
LDL CALC: 85 mg/dL (ref <130)
Total CHOL/HDL Ratio: 2.9 Ratio (ref <=5.0)
Triglycerides: 165 mg/dL — ABNORMAL HIGH (ref <150)
VLDL: 33 mg/dL — ABNORMAL HIGH (ref <30)

## 2015-05-26 MED ORDER — HYDROCORTISONE 0.5 % EX CREA: 1.0000 "application " | TOPICAL_CREAM | Freq: Two times a day (BID) | CUTANEOUS | Status: DC

## 2015-05-26 NOTE — Progress Notes (Signed)
Patient ID: Vanessa Alvarez, female   DOB: 19-Sep-1985, 30 y.o.   MRN: [id number] Date of Visit: 05/26/2015   HPI: Ms. Vanessa Alvarez is a 30yo here for concerns about her PICC line.   PICC Line: - noted of "somethign crawling/biting/moving" around near her picc line and the right side of her neck; also notes of itching  - picc line placed nov 21st 2016 for Dialysis access until fistula matures - symptoms started "a few weeks ago" - small bumps around the area with some mild redness - no increased tenderness and no drainage - had dressing changed on Saturday; dressing change tomorrow at dialysis center - no fevers/chills  ROS: See HPI.   PHYSICAL EXAM: BP 122/80 mmHg  Pulse 103  Temp(Src) 98.9 F (37.2 C) (Oral)  Ht 5\' 4"  (1.626 m)  Wt 184 lb (83.462 kg)  BMI 31.57 kg/m2  SpO2 99% Gen: NAD Chest: Picc line noted on right chest with gauze and Tegaderm. Site around piccline has small <45mm mildly red papules. No tenderness to palpation, no significant erythema or increased warmth. No drainagge Heart: RRR. No m/r/g Lungs: CTAB  ASSESSMENT/PLAN:  Health maintenance:  - repeat lipid panel - A1c to screen for DM   Irritant contact dermatitis Around the gauze covering around PICC line. No sign of infection. Asked the patient to ask nephrologist if the area needs to be constantly covered. It may help to have the area open to air. Has dialysis tomorrow 1/31. Recommended that patient mention this to nephrologist as well - Hydrocortisone 0.5% cream to apply to affected area for itching    FOLLOW UP: Follow up with PCP PRN   Smiley Houseman, MD PGY [place]

## 2015-05-26 NOTE — Assessment & Plan Note (Addendum)
Around the gauze covering around PICC line. No sign of infection. Asked the patient to ask nephrologist if the area needs to be constantly covered. It may help to have the area open to air. Has dialysis tomorrow 1/31. Recommended that patient mention this to nephrologist as well - Hydrocortisone 0.5% cream to apply to affected area for itching

## 2015-05-26 NOTE — Patient Instructions (Signed)
We sent a prescription for hydrocortisone cream. You can apply a small amount around the affected area to help with itching.

## 2015-05-27 DIAGNOSIS — I158 Other secondary hypertension: Secondary | ICD-10-CM | POA: Diagnosis not present

## 2015-05-27 DIAGNOSIS — Z23 Encounter for immunization: Secondary | ICD-10-CM | POA: Diagnosis not present

## 2015-05-27 DIAGNOSIS — D631 Anemia in chronic kidney disease: Secondary | ICD-10-CM | POA: Diagnosis not present

## 2015-05-27 DIAGNOSIS — N2581 Secondary hyperparathyroidism of renal origin: Secondary | ICD-10-CM | POA: Diagnosis not present

## 2015-05-27 DIAGNOSIS — D509 Iron deficiency anemia, unspecified: Secondary | ICD-10-CM | POA: Diagnosis not present

## 2015-05-27 DIAGNOSIS — Z992 Dependence on renal dialysis: Secondary | ICD-10-CM | POA: Diagnosis not present

## 2015-05-27 DIAGNOSIS — N186 End stage renal disease: Secondary | ICD-10-CM | POA: Diagnosis not present

## 2015-05-28 ENCOUNTER — Other Ambulatory Visit: Payer: Self-pay | Admitting: Family Medicine

## 2015-05-29 DIAGNOSIS — Z23 Encounter for immunization: Secondary | ICD-10-CM | POA: Diagnosis not present

## 2015-05-29 DIAGNOSIS — D509 Iron deficiency anemia, unspecified: Secondary | ICD-10-CM | POA: Diagnosis not present

## 2015-05-29 DIAGNOSIS — D631 Anemia in chronic kidney disease: Secondary | ICD-10-CM | POA: Diagnosis not present

## 2015-05-29 DIAGNOSIS — N186 End stage renal disease: Secondary | ICD-10-CM | POA: Diagnosis not present

## 2015-05-29 DIAGNOSIS — N2581 Secondary hyperparathyroidism of renal origin: Secondary | ICD-10-CM | POA: Diagnosis not present

## 2015-05-31 DIAGNOSIS — N2581 Secondary hyperparathyroidism of renal origin: Secondary | ICD-10-CM | POA: Diagnosis not present

## 2015-05-31 DIAGNOSIS — N186 End stage renal disease: Secondary | ICD-10-CM | POA: Diagnosis not present

## 2015-05-31 DIAGNOSIS — D631 Anemia in chronic kidney disease: Secondary | ICD-10-CM | POA: Diagnosis not present

## 2015-05-31 DIAGNOSIS — D509 Iron deficiency anemia, unspecified: Secondary | ICD-10-CM | POA: Diagnosis not present

## 2015-05-31 DIAGNOSIS — Z23 Encounter for immunization: Secondary | ICD-10-CM | POA: Diagnosis not present

## 2015-06-03 DIAGNOSIS — F319 Bipolar disorder, unspecified: Secondary | ICD-10-CM | POA: Diagnosis not present

## 2015-06-03 DIAGNOSIS — N2581 Secondary hyperparathyroidism of renal origin: Secondary | ICD-10-CM | POA: Diagnosis not present

## 2015-06-03 DIAGNOSIS — Z23 Encounter for immunization: Secondary | ICD-10-CM | POA: Diagnosis not present

## 2015-06-03 DIAGNOSIS — F71 Moderate intellectual disabilities: Secondary | ICD-10-CM | POA: Diagnosis not present

## 2015-06-03 DIAGNOSIS — D509 Iron deficiency anemia, unspecified: Secondary | ICD-10-CM | POA: Diagnosis not present

## 2015-06-03 DIAGNOSIS — D631 Anemia in chronic kidney disease: Secondary | ICD-10-CM | POA: Diagnosis not present

## 2015-06-03 DIAGNOSIS — N186 End stage renal disease: Secondary | ICD-10-CM | POA: Diagnosis not present

## 2015-06-05 DIAGNOSIS — D631 Anemia in chronic kidney disease: Secondary | ICD-10-CM | POA: Diagnosis not present

## 2015-06-05 DIAGNOSIS — N2581 Secondary hyperparathyroidism of renal origin: Secondary | ICD-10-CM | POA: Diagnosis not present

## 2015-06-05 DIAGNOSIS — N186 End stage renal disease: Secondary | ICD-10-CM | POA: Diagnosis not present

## 2015-06-05 DIAGNOSIS — D509 Iron deficiency anemia, unspecified: Secondary | ICD-10-CM | POA: Diagnosis not present

## 2015-06-05 DIAGNOSIS — Z23 Encounter for immunization: Secondary | ICD-10-CM | POA: Diagnosis not present

## 2015-06-06 ENCOUNTER — Other Ambulatory Visit: Payer: Self-pay | Admitting: Family Medicine

## 2015-06-06 NOTE — Telephone Encounter (Signed)
Needs lab visit for thyroid check.  Will refill x1.

## 2015-06-07 DIAGNOSIS — D509 Iron deficiency anemia, unspecified: Secondary | ICD-10-CM | POA: Diagnosis not present

## 2015-06-07 DIAGNOSIS — D631 Anemia in chronic kidney disease: Secondary | ICD-10-CM | POA: Diagnosis not present

## 2015-06-07 DIAGNOSIS — N2581 Secondary hyperparathyroidism of renal origin: Secondary | ICD-10-CM | POA: Diagnosis not present

## 2015-06-07 DIAGNOSIS — Z23 Encounter for immunization: Secondary | ICD-10-CM | POA: Diagnosis not present

## 2015-06-07 DIAGNOSIS — N186 End stage renal disease: Secondary | ICD-10-CM | POA: Diagnosis not present

## 2015-06-08 ENCOUNTER — Other Ambulatory Visit: Payer: Self-pay | Admitting: Family Medicine

## 2015-06-09 ENCOUNTER — Other Ambulatory Visit: Payer: Self-pay | Admitting: Family Medicine

## 2015-06-09 DIAGNOSIS — E039 Hypothyroidism, unspecified: Secondary | ICD-10-CM

## 2015-06-10 DIAGNOSIS — D631 Anemia in chronic kidney disease: Secondary | ICD-10-CM | POA: Diagnosis not present

## 2015-06-10 DIAGNOSIS — N186 End stage renal disease: Secondary | ICD-10-CM | POA: Diagnosis not present

## 2015-06-10 DIAGNOSIS — N2581 Secondary hyperparathyroidism of renal origin: Secondary | ICD-10-CM | POA: Diagnosis not present

## 2015-06-10 DIAGNOSIS — D509 Iron deficiency anemia, unspecified: Secondary | ICD-10-CM | POA: Diagnosis not present

## 2015-06-10 DIAGNOSIS — Z23 Encounter for immunization: Secondary | ICD-10-CM | POA: Diagnosis not present

## 2015-06-12 ENCOUNTER — Other Ambulatory Visit: Payer: Self-pay | Admitting: Family Medicine

## 2015-06-12 DIAGNOSIS — D509 Iron deficiency anemia, unspecified: Secondary | ICD-10-CM | POA: Diagnosis not present

## 2015-06-12 DIAGNOSIS — N2581 Secondary hyperparathyroidism of renal origin: Secondary | ICD-10-CM | POA: Diagnosis not present

## 2015-06-12 DIAGNOSIS — N186 End stage renal disease: Secondary | ICD-10-CM | POA: Diagnosis not present

## 2015-06-12 DIAGNOSIS — Z23 Encounter for immunization: Secondary | ICD-10-CM | POA: Diagnosis not present

## 2015-06-12 DIAGNOSIS — D631 Anemia in chronic kidney disease: Secondary | ICD-10-CM | POA: Diagnosis not present

## 2015-06-13 ENCOUNTER — Other Ambulatory Visit: Payer: Self-pay | Admitting: Family Medicine

## 2015-06-14 DIAGNOSIS — N2581 Secondary hyperparathyroidism of renal origin: Secondary | ICD-10-CM | POA: Diagnosis not present

## 2015-06-14 DIAGNOSIS — D509 Iron deficiency anemia, unspecified: Secondary | ICD-10-CM | POA: Diagnosis not present

## 2015-06-14 DIAGNOSIS — N186 End stage renal disease: Secondary | ICD-10-CM | POA: Diagnosis not present

## 2015-06-14 DIAGNOSIS — Z23 Encounter for immunization: Secondary | ICD-10-CM | POA: Diagnosis not present

## 2015-06-14 DIAGNOSIS — D631 Anemia in chronic kidney disease: Secondary | ICD-10-CM | POA: Diagnosis not present

## 2015-06-16 NOTE — Telephone Encounter (Signed)
Please call patient to make sure that she is coming in for TSH check.  She needs a TSH check before more refills.

## 2015-06-17 DIAGNOSIS — N186 End stage renal disease: Secondary | ICD-10-CM | POA: Diagnosis not present

## 2015-06-17 DIAGNOSIS — D631 Anemia in chronic kidney disease: Secondary | ICD-10-CM | POA: Diagnosis not present

## 2015-06-17 DIAGNOSIS — N2581 Secondary hyperparathyroidism of renal origin: Secondary | ICD-10-CM | POA: Diagnosis not present

## 2015-06-17 DIAGNOSIS — D509 Iron deficiency anemia, unspecified: Secondary | ICD-10-CM | POA: Diagnosis not present

## 2015-06-17 DIAGNOSIS — Z23 Encounter for immunization: Secondary | ICD-10-CM | POA: Diagnosis not present

## 2015-06-19 DIAGNOSIS — D509 Iron deficiency anemia, unspecified: Secondary | ICD-10-CM | POA: Diagnosis not present

## 2015-06-19 DIAGNOSIS — N186 End stage renal disease: Secondary | ICD-10-CM | POA: Diagnosis not present

## 2015-06-19 DIAGNOSIS — D631 Anemia in chronic kidney disease: Secondary | ICD-10-CM | POA: Diagnosis not present

## 2015-06-19 DIAGNOSIS — N2581 Secondary hyperparathyroidism of renal origin: Secondary | ICD-10-CM | POA: Diagnosis not present

## 2015-06-19 DIAGNOSIS — Z23 Encounter for immunization: Secondary | ICD-10-CM | POA: Diagnosis not present

## 2015-06-21 DIAGNOSIS — Z23 Encounter for immunization: Secondary | ICD-10-CM | POA: Diagnosis not present

## 2015-06-21 DIAGNOSIS — D631 Anemia in chronic kidney disease: Secondary | ICD-10-CM | POA: Diagnosis not present

## 2015-06-21 DIAGNOSIS — D509 Iron deficiency anemia, unspecified: Secondary | ICD-10-CM | POA: Diagnosis not present

## 2015-06-21 DIAGNOSIS — N2581 Secondary hyperparathyroidism of renal origin: Secondary | ICD-10-CM | POA: Diagnosis not present

## 2015-06-21 DIAGNOSIS — N186 End stage renal disease: Secondary | ICD-10-CM | POA: Diagnosis not present

## 2015-06-24 DIAGNOSIS — D509 Iron deficiency anemia, unspecified: Secondary | ICD-10-CM | POA: Diagnosis not present

## 2015-06-24 DIAGNOSIS — N2581 Secondary hyperparathyroidism of renal origin: Secondary | ICD-10-CM | POA: Diagnosis not present

## 2015-06-24 DIAGNOSIS — Z992 Dependence on renal dialysis: Secondary | ICD-10-CM | POA: Diagnosis not present

## 2015-06-24 DIAGNOSIS — D631 Anemia in chronic kidney disease: Secondary | ICD-10-CM | POA: Diagnosis not present

## 2015-06-24 DIAGNOSIS — I158 Other secondary hypertension: Secondary | ICD-10-CM | POA: Diagnosis not present

## 2015-06-24 DIAGNOSIS — N186 End stage renal disease: Secondary | ICD-10-CM | POA: Diagnosis not present

## 2015-06-24 DIAGNOSIS — Z23 Encounter for immunization: Secondary | ICD-10-CM | POA: Diagnosis not present

## 2015-06-26 DIAGNOSIS — N2581 Secondary hyperparathyroidism of renal origin: Secondary | ICD-10-CM | POA: Diagnosis not present

## 2015-06-26 DIAGNOSIS — D631 Anemia in chronic kidney disease: Secondary | ICD-10-CM | POA: Diagnosis not present

## 2015-06-26 DIAGNOSIS — D509 Iron deficiency anemia, unspecified: Secondary | ICD-10-CM | POA: Diagnosis not present

## 2015-06-26 DIAGNOSIS — N186 End stage renal disease: Secondary | ICD-10-CM | POA: Diagnosis not present

## 2015-06-28 DIAGNOSIS — N2581 Secondary hyperparathyroidism of renal origin: Secondary | ICD-10-CM | POA: Diagnosis not present

## 2015-06-28 DIAGNOSIS — N186 End stage renal disease: Secondary | ICD-10-CM | POA: Diagnosis not present

## 2015-06-28 DIAGNOSIS — D509 Iron deficiency anemia, unspecified: Secondary | ICD-10-CM | POA: Diagnosis not present

## 2015-06-28 DIAGNOSIS — D631 Anemia in chronic kidney disease: Secondary | ICD-10-CM | POA: Diagnosis not present

## 2015-06-30 ENCOUNTER — Encounter (HOSPITAL_COMMUNITY): Payer: Self-pay | Admitting: Emergency Medicine

## 2015-06-30 ENCOUNTER — Emergency Department (HOSPITAL_COMMUNITY)
Admission: EM | Admit: 2015-06-30 | Discharge: 2015-06-30 | Disposition: A | Discharge status: Home / Self Care | Payer: Medicare Other | Attending: Physician Assistant | Admitting: Physician Assistant

## 2015-06-30 ENCOUNTER — Emergency Department (HOSPITAL_COMMUNITY): Payer: Medicare Other

## 2015-06-30 DIAGNOSIS — Y9289 Other specified places as the place of occurrence of the external cause: Secondary | ICD-10-CM | POA: Diagnosis not present

## 2015-06-30 DIAGNOSIS — Y9389 Activity, other specified: Secondary | ICD-10-CM | POA: Insufficient documentation

## 2015-06-30 DIAGNOSIS — Y998 Other external cause status: Secondary | ICD-10-CM | POA: Insufficient documentation

## 2015-06-30 DIAGNOSIS — E039 Hypothyroidism, unspecified: Secondary | ICD-10-CM | POA: Insufficient documentation

## 2015-06-30 DIAGNOSIS — Z992 Dependence on renal dialysis: Secondary | ICD-10-CM | POA: Diagnosis not present

## 2015-06-30 DIAGNOSIS — D649 Anemia, unspecified: Secondary | ICD-10-CM | POA: Insufficient documentation

## 2015-06-30 DIAGNOSIS — N186 End stage renal disease: Secondary | ICD-10-CM | POA: Insufficient documentation

## 2015-06-30 DIAGNOSIS — M179 Osteoarthritis of knee, unspecified: Secondary | ICD-10-CM | POA: Diagnosis not present

## 2015-06-30 DIAGNOSIS — S8991XA Unspecified injury of right lower leg, initial encounter: Secondary | ICD-10-CM | POA: Diagnosis not present

## 2015-06-30 DIAGNOSIS — Z7952 Long term (current) use of systemic steroids: Secondary | ICD-10-CM | POA: Insufficient documentation

## 2015-06-30 DIAGNOSIS — X58XXXA Exposure to other specified factors, initial encounter: Secondary | ICD-10-CM | POA: Insufficient documentation

## 2015-06-30 DIAGNOSIS — Z79899 Other long term (current) drug therapy: Secondary | ICD-10-CM | POA: Diagnosis not present

## 2015-06-30 DIAGNOSIS — M1711 Unilateral primary osteoarthritis, right knee: Secondary | ICD-10-CM | POA: Insufficient documentation

## 2015-06-30 DIAGNOSIS — I120 Hypertensive chronic kidney disease with stage 5 chronic kidney disease or end stage renal disease: Secondary | ICD-10-CM | POA: Insufficient documentation

## 2015-06-30 DIAGNOSIS — F840 Autistic disorder: Secondary | ICD-10-CM | POA: Insufficient documentation

## 2015-06-30 DIAGNOSIS — K219 Gastro-esophageal reflux disease without esophagitis: Secondary | ICD-10-CM | POA: Insufficient documentation

## 2015-06-30 DIAGNOSIS — M25561 Pain in right knee: Secondary | ICD-10-CM | POA: Diagnosis not present

## 2015-06-30 NOTE — ED Notes (Signed)
Patient here with right lower extremity pain. State onset yesterday, but not sure of cause. Ambulatory with limp. No swelling to lower extremity, normothermic, color appropriate, pulse intact.

## 2015-06-30 NOTE — ED Notes (Signed)
ESRD patient. Denies missing appointments. Right chest wall catheter in place, appears well. Tues Thurs Sat is normal schedule.

## 2015-06-30 NOTE — ED Provider Notes (Signed)
CSN: [id number]     Arrival date & time 06/30/15  2041 History  By signing my name below, I, [name], attest that this documentation has been prepared under the direction and in the presence of non-physician practitioner, Delrae Rend, PA-C. Electronically Signed: [name], Scribe. 06/30/2015. 9:06 PM.     Chief Complaint  Patient presents with  . Knee Pain    The history is provided by the patient. No language interpreter was used.     HPI Comments: Vanessa Alvarez is a 30 y.o. female with h/o autism spectrum disorder who presents to the Emergency Department complaining of constant right knee pain onset yesterday. Pt does not know if she may have injured herself. She endorses pain throughout her right knee and directly above her right knee. She notes that her pain is 1/10 currently but was 10/10 earlier today. Pt also notes that she experienced pain when she felt something in her right knee pop earlier while she was sitting down. Pt has not been ambulating more than usual; she is able to ambulate currently. She has not taken any medicine for her right knee pain. Pt denies numbness or any other associated symptoms.   Past Medical History  Diagnosis Date  . Hypertension   . Borderline mental retardation   . Autism spectrum   . Mood disorder [facility])     Sees Dr Silvio Pate, who prescribes meds  . Anemia     Iron Def  . 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 Hypothyroidism   . HYPOTHYROIDISM 03/12/2009    Qualifier: Diagnosis of  By: Earley Favor MD, Cat    . CKD (chronic kidney disease) stage 3, GFR 30-59 ml/min 01/2015  . GERD (gastroesophageal reflux disease)   . Shortness of breath dyspnea     with exertion   Past Surgical History  Procedure Laterality Date  . Cholecystectomy  11/2004  . Av fistula placement Left 03/17/2015    Procedure: CREATION OF LEFT BRACHIOCEPHALIC ARTERIOVENOUS (AV) FISTULA ;  Surgeon: Conrad [place], MD;  Location: [place];   Service: Vascular;  Laterality: Left;  . Insertion of dialysis catheter Right 03/17/2015    Procedure: INSERTION OF DIALYSIS CATHETER RIGHT INTERNAL JUIGULAR PLACEMENT;  Surgeon: Conrad [place], MD;  Location: [place];  Service: Vascular;  Laterality: Right;   Family History  Problem Relation Age of Onset  . Hypertension Mother   . Diabetes Maternal Grandmother   . Hypertension Maternal Grandmother   . Heart disease Maternal Grandmother   . Cancer Maternal Grandmother   . Diabetes Maternal Grandfather    Social History  Substance Use Topics  . Smoking status: Never Smoker   . Smokeless tobacco: Never Used  . Alcohol Use: No   OB History    No data available     Review of Systems  Musculoskeletal: Positive for arthralgias.  Neurological: Negative for numbness.  All other systems reviewed and are negative.     Allergies  Review of patient's allergies indicates no known allergies.  Home Medications   Prior to Admission medications   Medication Sig Start Date End Date Taking? Authorizing Provider  amLODipine (NORVASC) 5 MG tablet Take 5 mg by mouth daily. Reported on 05/26/2015 01/08/15   Historical Provider, MD  ARIPiprazole (ABILIFY) 5 MG tablet Take 5 mg by mouth at bedtime.    Historical Provider, MD  ferrous sulfate 325 (65 FE) MG tablet Take 1 tablet (325 mg  total) by mouth 2 (two) times daily. 01/07/12   Clovis Cao, MD  HYDROcodone-acetaminophen (NORCO/VICODIN) 5-325 MG tablet Take 1 tablet by mouth every 4 (four) hours as needed for moderate pain. Patient not taking: Reported on 05/26/2015 03/17/15   Aldona Bar J Rhyne, PA-C  hydrocortisone cream 0.5 % Apply 1 application topically 2 (two) times daily. 05/26/15   Smiley Houseman, MD  lamoTRIgine (LAMICTAL) 25 MG tablet  04/10/15   Historical Provider, MD  levothyroxine (SYNTHROID, LEVOTHROID) 112 MCG tablet TAKE 1 TABLET DAILY BEFORE BREAKFAST. 06/06/15   Ashly Windell Moulding, DO  omeprazole (PRILOSEC) 20 MG capsule TAKE 1  CAPSULE (20 MG TOTAL) BY MOUTH DAILY. 06/09/15   Ashly Windell Moulding, DO  ondansetron (ZOFRAN ODT) 4 MG disintegrating tablet Take 1 tablet (4 mg total) by mouth every 8 (eight) hours as needed for nausea or vomiting. Patient not taking: Reported on 05/26/2015 02/21/15   [name], DO  polyethylene glycol (MIRALAX / GLYCOLAX) packet Take 17 g by mouth daily as needed for mild constipation. Patient not taking: Reported on 05/26/2015 03/05/15   Smiley Houseman, MD  polyethylene glycol powder (GLYCOLAX/MIRALAX) powder Take 17 g by mouth daily. Reported on 05/26/2015 03/03/15   Historical Provider, MD  pravastatin (PRAVACHOL) 20 MG tablet Take 20 mg by mouth daily. 01/08/15   Historical Provider, MD  promethazine (PHENERGAN) 25 MG tablet Reported on 05/26/2015 02/11/15   Historical Provider, MD  ranitidine (ZANTAC) 150 MG tablet TAKE 1 TABLET BY MOUTH ONCE DAILY. 02/14/15   [name], DO  RENVELA 800 MG tablet  03/29/15   Historical Provider, MD  SENSIPAR 30 MG tablet  03/29/15   Historical Provider, MD  sertraline (ZOLOFT) 100 MG tablet Take 100 mg by mouth daily. 2 tabs per day    Historical Provider, MD   BP 135/84 mmHg  Pulse 90  Temp(Src) 98.3 F (36.8 C) (Oral)  Resp 14  SpO2 99%  LMP 06/30/2015 (Exact Date) Physical Exam  Constitutional: She is oriented to person, place, and time. She appears well-developed and well-nourished. No distress.  HENT:  Head: Normocephalic and atraumatic.  Eyes: Conjunctivae and EOM are normal.  Neck: Neck supple. No tracheal deviation present.  Cardiovascular: Normal rate.   Pulmonary/Chest: Effort normal. No respiratory distress.  Musculoskeletal: Normal range of motion.  Right knee with mild diffuse tenderness. FROM. No laxity. No crepitus. 2+ distal pulses. Steady gait.  Neurological: She is alert and oriented to person, place, and time.  Skin: Skin is warm and dry.  Psychiatric: She has a normal mood and affect. Her behavior is normal.   Nursing note and vitals reviewed.   ED Course  Procedures (including critical care time)  DIAGNOSTIC STUDIES: Oxygen Saturation is 99% on RA, normal by my interpretation.    COORDINATION OF CARE: 9:25 PM Discussed treatment plan with pt at bedside and pt agreed to plan.    DIAGNOSTIC STUDIES: Oxygen Saturation is 99% on RA, normal by my interpretation.    COORDINATION OF CARE: 9:07 PM Discussed treatment plan with pt at bedside and pt agreed to plan.   Labs Review Labs Reviewed - No data to display  Imaging Review Dg Knee Complete 4 Views Right  06/30/2015  CLINICAL DATA:  Right knee pain for 2 days, no known acute injury. EXAM: RIGHT KNEE - COMPLETE 4+ VIEW COMPARISON:  Radiographs 04/21/2008 FINDINGS: No fracture or dislocation. The alignment and joint spaces are maintained. Early spurring of the medial and lateral tibial femoral  compartments. Small patellar tendon enthesophyte. No joint effusion. IMPRESSION: No acute abnormality. Minimal peripheral spurring consistent with early very mild osteoarthritis. Electronically Signed   By: Jeb Levering M.D.   On: 06/30/2015 21:19   I have personally reviewed and evaluated these images and lab results as part of my medical decision-making.   EKG Interpretation None      MDM   Final diagnoses:  Right knee pain  Osteoarthritis of right knee, unspecified osteoarthritis type    Xray reveals mild early OA otherwise unremarkable. Nonfocal exam and pt able to ambulate unassisted. Discussed findings with pt and her mother. She may take Tylenol prn. Declines tylenol in the ED. INstructed to f/u with PCP. ER return precautions given.   I personally performed the services described in this documentation, which was scribed in my presence. The recorded information has been reviewed and is accurate.    Anne Ng, PA-C 06/30/15 2133  Courteney Julio Alm, MD 07/01/15 [phone]

## 2015-06-30 NOTE — Discharge Instructions (Signed)
You were seen in the ER today for evaluation of knee pain. Your exam was normal. Your x-ray showed evidence of very early mild arthritis. You may take tylenol as needed. Apply ice on and off for the next 24-48 hours as needed for pain and swelling. Return to the ER for new or worsening symptoms.

## 2015-07-01 DIAGNOSIS — D631 Anemia in chronic kidney disease: Secondary | ICD-10-CM | POA: Diagnosis not present

## 2015-07-01 DIAGNOSIS — D509 Iron deficiency anemia, unspecified: Secondary | ICD-10-CM | POA: Diagnosis not present

## 2015-07-01 DIAGNOSIS — N186 End stage renal disease: Secondary | ICD-10-CM | POA: Diagnosis not present

## 2015-07-01 DIAGNOSIS — N2581 Secondary hyperparathyroidism of renal origin: Secondary | ICD-10-CM | POA: Diagnosis not present

## 2015-07-03 DIAGNOSIS — D631 Anemia in chronic kidney disease: Secondary | ICD-10-CM | POA: Diagnosis not present

## 2015-07-03 DIAGNOSIS — N186 End stage renal disease: Secondary | ICD-10-CM | POA: Diagnosis not present

## 2015-07-03 DIAGNOSIS — D509 Iron deficiency anemia, unspecified: Secondary | ICD-10-CM | POA: Diagnosis not present

## 2015-07-03 DIAGNOSIS — N2581 Secondary hyperparathyroidism of renal origin: Secondary | ICD-10-CM | POA: Diagnosis not present

## 2015-07-05 DIAGNOSIS — N186 End stage renal disease: Secondary | ICD-10-CM | POA: Diagnosis not present

## 2015-07-05 DIAGNOSIS — D509 Iron deficiency anemia, unspecified: Secondary | ICD-10-CM | POA: Diagnosis not present

## 2015-07-05 DIAGNOSIS — D631 Anemia in chronic kidney disease: Secondary | ICD-10-CM | POA: Diagnosis not present

## 2015-07-05 DIAGNOSIS — N2581 Secondary hyperparathyroidism of renal origin: Secondary | ICD-10-CM | POA: Diagnosis not present

## 2015-07-06 ENCOUNTER — Encounter (HOSPITAL_COMMUNITY): Payer: Self-pay | Admitting: Emergency Medicine

## 2015-07-06 ENCOUNTER — Other Ambulatory Visit: Payer: Self-pay | Admitting: Family Medicine

## 2015-07-06 ENCOUNTER — Emergency Department (HOSPITAL_COMMUNITY)
Admission: EM | Admit: 2015-07-06 | Discharge: 2015-07-06 | Disposition: A | Discharge status: Home / Self Care | Payer: Medicare Other | Attending: Emergency Medicine | Admitting: Emergency Medicine

## 2015-07-06 ENCOUNTER — Ambulatory Visit (INDEPENDENT_AMBULATORY_CARE_PROVIDER_SITE_OTHER): Payer: Medicare Other | Admitting: Family Medicine

## 2015-07-06 VITALS — BP 130/90 | HR 114 | Temp 98.6°F | Resp 18 | Ht 64.75 in | Wt 182.1 lb

## 2015-07-06 DIAGNOSIS — G249 Dystonia, unspecified: Secondary | ICD-10-CM | POA: Diagnosis not present

## 2015-07-06 DIAGNOSIS — Z992 Dependence on renal dialysis: Secondary | ICD-10-CM | POA: Diagnosis not present

## 2015-07-06 DIAGNOSIS — N186 End stage renal disease: Secondary | ICD-10-CM | POA: Diagnosis not present

## 2015-07-06 DIAGNOSIS — Z7952 Long term (current) use of systemic steroids: Secondary | ICD-10-CM | POA: Insufficient documentation

## 2015-07-06 DIAGNOSIS — Z79899 Other long term (current) drug therapy: Secondary | ICD-10-CM | POA: Diagnosis not present

## 2015-07-06 DIAGNOSIS — N179 Acute kidney failure, unspecified: Secondary | ICD-10-CM | POA: Diagnosis not present

## 2015-07-06 DIAGNOSIS — L03313 Cellulitis of chest wall: Secondary | ICD-10-CM | POA: Diagnosis not present

## 2015-07-06 DIAGNOSIS — I120 Hypertensive chronic kidney disease with stage 5 chronic kidney disease or end stage renal disease: Secondary | ICD-10-CM | POA: Diagnosis not present

## 2015-07-06 DIAGNOSIS — E039 Hypothyroidism, unspecified: Secondary | ICD-10-CM | POA: Insufficient documentation

## 2015-07-06 DIAGNOSIS — F39 Unspecified mood [affective] disorder: Secondary | ICD-10-CM | POA: Diagnosis not present

## 2015-07-06 DIAGNOSIS — D649 Anemia, unspecified: Secondary | ICD-10-CM | POA: Insufficient documentation

## 2015-07-06 DIAGNOSIS — F840 Autistic disorder: Secondary | ICD-10-CM | POA: Insufficient documentation

## 2015-07-06 DIAGNOSIS — M25561 Pain in right knee: Secondary | ICD-10-CM | POA: Diagnosis not present

## 2015-07-06 DIAGNOSIS — R110 Nausea: Secondary | ICD-10-CM | POA: Diagnosis not present

## 2015-07-06 DIAGNOSIS — R7989 Other specified abnormal findings of blood chemistry: Secondary | ICD-10-CM | POA: Insufficient documentation

## 2015-07-06 DIAGNOSIS — K219 Gastro-esophageal reflux disease without esophagitis: Secondary | ICD-10-CM | POA: Diagnosis not present

## 2015-07-06 DIAGNOSIS — M79604 Pain in right leg: Secondary | ICD-10-CM | POA: Diagnosis not present

## 2015-07-06 LAB — COMPREHENSIVE METABOLIC PANEL
ALK PHOS: 65 U/L (ref 38–126)
ALT: 12 U/L — AB (ref 14–54)
AST: 17 U/L (ref 15–41)
Albumin: 3.4 g/dL — ABNORMAL LOW (ref 3.5–5.0)
Anion gap: 15 (ref 5–15)
BILIRUBIN TOTAL: 0.5 mg/dL (ref 0.3–1.2)
BUN: 15 mg/dL (ref 6–20)
CHLORIDE: 98 mmol/L — AB (ref 101–111)
CO2: 26 mmol/L (ref 22–32)
CREATININE: 6.32 mg/dL — AB (ref 0.44–1.00)
Calcium: 9.3 mg/dL (ref 8.9–10.3)
GFR calc Af Amer: 9 mL/min — ABNORMAL LOW (ref >60)
GFR, EST NON AFRICAN AMERICAN: 8 mL/min — AB (ref >60)
Glucose, Bld: 84 mg/dL (ref 65–99)
Potassium: 3.2 mmol/L — ABNORMAL LOW (ref 3.5–5.1)
Sodium: 139 mmol/L (ref 135–145)
Total Protein: 8.3 g/dL — ABNORMAL HIGH (ref 6.5–8.1)

## 2015-07-06 LAB — CBC
HCT: 35.1 % — ABNORMAL LOW (ref 36.0–46.0)
Hemoglobin: 11.4 g/dL — ABNORMAL LOW (ref 12.0–15.0)
MCH: 26.4 pg (ref 26.0–34.0)
MCHC: 32.5 g/dL (ref 30.0–36.0)
MCV: 81.3 fL (ref 78.0–100.0)
PLATELETS: 240 10*3/uL (ref 150–400)
RBC: 4.32 MIL/uL (ref 3.87–5.11)
RDW: 15.5 % (ref 11.5–15.5)
WBC: 6.5 10*3/uL (ref 4.0–10.5)

## 2015-07-06 MED ORDER — CYCLOBENZAPRINE HCL 10 MG PO TABS: 10.0000 mg | ORAL_TABLET | Freq: Two times a day (BID) | ORAL | Status: DC | PRN

## 2015-07-06 NOTE — Patient Instructions (Signed)
     IF you received an x-ray today, you will receive an invoice from [facility]. Please contact [facility] at [phone] with questions or concerns regarding your invoice.   IF you received labwork today, you will receive an invoice from [facility]. Please contact [facility] at [phone] with questions or concerns regarding your invoice.   Our billing staff will not be able to assist you with questions regarding bills from these companies.  You will be contacted with the lab results as soon as they are available. The fastest way to get your results is to activate your My Chart account. Instructions are located on the last page of this paperwork. If you have not heard from us regarding the results in 2 weeks, please contact this office.      

## 2015-07-06 NOTE — ED Notes (Signed)
Family at bedside. 

## 2015-07-06 NOTE — Progress Notes (Addendum)
   Subjective:    Patient ID: Vanessa Alvarez, female    DOB: 05/15/1995, 30 y.o.   MRN: [id number] By signing my name below, I, Vanessa Alvarez, attest that this documentation has been prepared under the direction and in the presence of Robyn Haber, MD.  Electronically Signed: Zola Alvarez, Medical Scribe. 07/06/2015. 4:07 PM.  HPI HPI Comments: [name] is a 30 y.o. female who presents to the [facility] complaining of right knee pain that started 1 week ago. Patient has also noticed spasms in her leg. She has difficulty ambulating and bending the knee due to the pain.   Patient has a rash on her chest where her catheter is (for dialysis). She last received dialysis yesterday. Patient denies pain or twitching in her other joints. She denies history of similar pain. No known injury.   Patient has been on all of her medications long-term, except for Lamictal which she started a few months ago. She went to the ED last week for this and had XR imaging done, but she did not have any blood work done then.    Review of Systems  Musculoskeletal: Positive for myalgias and arthralgias.  Skin: Positive for rash.       Objective:   Physical Exam CONSTITUTIONAL: Well developed/well nourished HEAD: Normocephalic/atraumatic EYES: EOM shows left exotropia/PERRL ENMT: Mucous membranes moist NECK: supple no meningeal signs SPINE: entire spine nontender GU: no cva tenderness NEURO: Pt is awake/alert; unable to bend right knee or move right ankle Patient is having fasciculations in the right quadriceps muscle. EXTREMITIES: pulses normal, the muscle mass on the right thigh is much less than the left SKIN: warm, color normal; with marked scaling and irritation around the Port-A-Cath site in her right chest  PSYCH: Diminished mental capacity  Immunization History  Administered Date(s) Administered  . Influenza-Unspecified 05/28/2015         Assessment & Plan:   I'm  concerned the patient's having myoclonic jerks on the basis of drug toxicity.  In addition, patient has what looks like a cellulitis around the right Port-A-Cath site and I'm concerned about her tetanus status.  I cannot find where she's had a tetanus shot in the computer.  For these reasons I'm sending her over to [place] to have further evaluation this afternoon.  Signed, Robyn Haber M.D.  This chart was scribed in my presence and reviewed by me personally.

## 2015-07-06 NOTE — Discharge Instructions (Signed)

## 2015-07-06 NOTE — ED Notes (Signed)
Patient is alert and orientedx4.  Patient was explained discharge instructions and they understood them with no questions.  The patient's mother is taking the patient home.

## 2015-07-06 NOTE — ED Provider Notes (Signed)
CSN: [id number]     Arrival date & time 07/06/15  1644 History   First MD Initiated Contact with Patient 07/06/15 1915     Chief Complaint  Patient presents with  . abnormal labs   . Spasms    The history is provided by the patient.  Patient presents with pain spasms in her right leg. His been going on for about the last week. Worse with laying on that side. She was seen in the ER previously and had an x-ray of chest showed some arthritis. She sits in urgent care and they're worried about medication effects. No fevers. She's had occasional nausea. No trauma. No weakness. Around a month and half ago she had some change in her Lamictal. She is on dialysis and is virtually switch from her chest dialysis catheter to her fistula.  Past Medical History  Diagnosis Date  . Hypertension   . Borderline mental retardation   . Autism spectrum   . Mood disorder [facility])     Sees Dr Silvio Pate, who prescribes meds  . Anemia     Iron Def  . 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 Hypothyroidism   . HYPOTHYROIDISM 03/12/2009    Qualifier: Diagnosis of  By: Earley Favor MD, Cat    . CKD (chronic kidney disease) stage 3, GFR 30-59 ml/min 01/2015  . GERD (gastroesophageal reflux disease)   . Shortness of breath dyspnea     with exertion   Past Surgical History  Procedure Laterality Date  . Cholecystectomy  11/2004  . Av fistula placement Left 03/17/2015    Procedure: CREATION OF LEFT BRACHIOCEPHALIC ARTERIOVENOUS (AV) FISTULA ;  Surgeon: Conrad Kimberly, MD;  Location: [place];  Service: Vascular;  Laterality: Left;  . Insertion of dialysis catheter Right 03/17/2015    Procedure: INSERTION OF DIALYSIS CATHETER RIGHT INTERNAL JUIGULAR PLACEMENT;  Surgeon: Conrad Wheaton, MD;  Location: [place];  Service: Vascular;  Laterality: Right;   Family History  Problem Relation Age of Onset  . Hypertension Mother   . Diabetes Maternal Grandmother   . Hypertension Maternal Grandmother   .  Heart disease Maternal Grandmother   . Cancer Maternal Grandmother   . Diabetes Maternal Grandfather    Social History  Substance Use Topics  . Smoking status: Never Smoker   . Smokeless tobacco: Never Used  . Alcohol Use: No   OB History    No data available     Review of Systems  Constitutional: Negative for activity change and appetite change.  Respiratory: Negative for chest tightness.   Cardiovascular: Negative for chest pain.  Gastrointestinal: Positive for nausea.  Genitourinary: Negative for flank pain.  Musculoskeletal: Negative for back pain.  Neurological: Negative for weakness.      Allergies  Review of patient's allergies indicates no known allergies.  Home Medications   Prior to Admission medications   Medication Sig Start Date End Date Taking? Authorizing Provider  amLODipine (NORVASC) 5 MG tablet Take 5 mg by mouth daily. Reported on 05/26/2015 01/08/15   Historical Provider, MD  ARIPiprazole (ABILIFY) 5 MG tablet Take 5 mg by mouth at bedtime.    Historical Provider, MD  cyclobenzaprine (FLEXERIL) 10 MG tablet Take 1 tablet (10 mg total) by mouth 2 (two) times daily as needed for muscle spasms. 07/06/15   Davonna Belling, MD  ferrous sulfate 325 (65 FE) MG tablet Take 1 tablet (325 mg total) by mouth 2 (two) times  daily. 01/07/12   Clovis Cao, MD  HYDROcodone-acetaminophen (NORCO/VICODIN) 5-325 MG tablet Take 1 tablet by mouth every 4 (four) hours as needed for moderate pain. Patient not taking: Reported on 05/26/2015 03/17/15   Aldona Bar J Rhyne, PA-C  hydrocortisone cream 0.5 % Apply 1 application topically 2 (two) times daily. 05/26/15   Smiley Houseman, MD  lamoTRIgine (LAMICTAL) 25 MG tablet  04/10/15   Historical Provider, MD  levothyroxine (SYNTHROID, LEVOTHROID) 112 MCG tablet TAKE 1 TABLET DAILY BEFORE BREAKFAST. 06/06/15   Ashly Windell Moulding, DO  omeprazole (PRILOSEC) 20 MG capsule TAKE 1 CAPSULE (20 MG TOTAL) BY MOUTH DAILY. 06/09/15   Ashly Windell Moulding, DO  ondansetron (ZOFRAN ODT) 4 MG disintegrating tablet Take 1 tablet (4 mg total) by mouth every 8 (eight) hours as needed for nausea or vomiting. Patient not taking: Reported on 05/26/2015 02/21/15   [name], DO  polyethylene glycol (MIRALAX / GLYCOLAX) packet Take 17 g by mouth daily as needed for mild constipation. Patient not taking: Reported on 05/26/2015 03/05/15   Smiley Houseman, MD  polyethylene glycol powder (GLYCOLAX/MIRALAX) powder Take 17 g by mouth daily. Reported on 05/26/2015 03/03/15   Historical Provider, MD  pravastatin (PRAVACHOL) 20 MG tablet Take 20 mg by mouth daily. 01/08/15   Historical Provider, MD  promethazine (PHENERGAN) 25 MG tablet Reported on 05/26/2015 02/11/15   Historical Provider, MD  ranitidine (ZANTAC) 150 MG tablet TAKE 1 TABLET BY MOUTH ONCE DAILY. 02/14/15   [name], DO  RENVELA 800 MG tablet  03/29/15   Historical Provider, MD  SENSIPAR 30 MG tablet  03/29/15   Historical Provider, MD  sertraline (ZOLOFT) 100 MG tablet Take 100 mg by mouth daily. 2 tabs per day    Historical Provider, MD   BP 137/85 mmHg  Pulse 97  Temp(Src) 99.4 F (37.4 C) (Oral)  Resp 18  Ht 5\' 4"  (1.626 m)  Wt 181 lb 7 oz (82.3 kg)  BMI 31.13 kg/m2  SpO2 100%  LMP 06/30/2015 (Exact Date) Physical Exam  Constitutional: She appears well-developed.  Eyes:  Disconjugate gaze.  Cardiovascular: Normal rate.   Pulmonary/Chest: Effort normal.  Abdominal: There is no tenderness.  Musculoskeletal: She exhibits tenderness.  Mild tenderness over right knee superiorly. Good range of motion. No tenderness over the proximal. No pain with straight leg raise.  Neurological: She is alert.  Skin: Skin is warm.    ED Course  Procedures (including critical care time) Labs Review Labs Reviewed  COMPREHENSIVE METABOLIC PANEL - Abnormal; Notable for the following:    Potassium 3.2 (*)    Chloride 98 (*)    Creatinine, Ser 6.32 (*)    Total Protein 8.3 (*)     Albumin 3.4 (*)    ALT 12 (*)    GFR calc non Af Amer 8 (*)    GFR calc Af Amer 9 (*)    All other components within normal limits  CBC - Abnormal; Notable for the following:    Hemoglobin 11.4 (*)    HCT 35.1 (*)    All other components within normal limits    Imaging Review No results found. I have personally reviewed and evaluated these images and lab results as part of my medical decision-making.   EKG Interpretation None      MDM   Final diagnoses:  Pain of right lower extremity    Patient presents with pain and spasm in her right lower leg. Mild hypokalemia. Recent x-ray  reassuring. She is on dialysis. Will discharge home to follow-up with her PCP as needed. We'll give muscle relaxer and patient's mother discussed with about medication reactions and the possibility if this was a movement issue from her psych medicines she would try Benadryl.    Davonna Belling, MD 07/06/15 2023

## 2015-07-06 NOTE — ED Notes (Signed)
Pt brought in for eval of possible medication issues re: her blood levels. Pt is on dialysis and has been experiencing leg cramps. Symptoms ongoing since Last Sunday. Dialysis days are T,Th, Sa. Pt went to dialysis yesterday.

## 2015-07-07 ENCOUNTER — Encounter (HOSPITAL_COMMUNITY): Payer: Self-pay | Admitting: Emergency Medicine

## 2015-07-07 NOTE — Telephone Encounter (Signed)
Pt returned call, appt made. Damonie Furney, Salome Spotted, CMA

## 2015-07-07 NOTE — Telephone Encounter (Signed)
Attempted to call patient to inform her that she needs labs.  This was reiterated last month.  Left voicemail.  Please call her again later today to remind her to have labs done.  Will refill medication once her labs come back.

## 2015-07-08 ENCOUNTER — Other Ambulatory Visit: Payer: Medicare Other

## 2015-07-08 DIAGNOSIS — E039 Hypothyroidism, unspecified: Secondary | ICD-10-CM

## 2015-07-08 DIAGNOSIS — N2581 Secondary hyperparathyroidism of renal origin: Secondary | ICD-10-CM | POA: Diagnosis not present

## 2015-07-08 DIAGNOSIS — D509 Iron deficiency anemia, unspecified: Secondary | ICD-10-CM | POA: Diagnosis not present

## 2015-07-08 DIAGNOSIS — N186 End stage renal disease: Secondary | ICD-10-CM | POA: Diagnosis not present

## 2015-07-08 DIAGNOSIS — D631 Anemia in chronic kidney disease: Secondary | ICD-10-CM | POA: Diagnosis not present

## 2015-07-08 LAB — TSH: TSH: 3.92 mIU/L

## 2015-07-08 NOTE — Progress Notes (Signed)
tsh done today Vanessa Alvarez 

## 2015-07-09 ENCOUNTER — Other Ambulatory Visit: Payer: Self-pay | Admitting: Family Medicine

## 2015-07-09 DIAGNOSIS — E039 Hypothyroidism, unspecified: Secondary | ICD-10-CM

## 2015-07-09 MED ORDER — LEVOTHYROXINE SODIUM 112 MCG PO TABS: ORAL_TABLET | ORAL | Status: DC

## 2015-07-10 ENCOUNTER — Ambulatory Visit (INDEPENDENT_AMBULATORY_CARE_PROVIDER_SITE_OTHER): Payer: Medicare Other | Admitting: Family Medicine

## 2015-07-10 ENCOUNTER — Encounter: Payer: Self-pay | Admitting: Family Medicine

## 2015-07-10 VITALS — BP 124/76 | HR 97 | Temp 98.2°F | Ht 64.0 in | Wt 180.7 lb

## 2015-07-10 DIAGNOSIS — Z992 Dependence on renal dialysis: Secondary | ICD-10-CM

## 2015-07-10 DIAGNOSIS — D509 Iron deficiency anemia, unspecified: Secondary | ICD-10-CM | POA: Diagnosis not present

## 2015-07-10 DIAGNOSIS — M79604 Pain in right leg: Secondary | ICD-10-CM | POA: Insufficient documentation

## 2015-07-10 DIAGNOSIS — Z1159 Encounter for screening for other viral diseases: Secondary | ICD-10-CM | POA: Diagnosis not present

## 2015-07-10 DIAGNOSIS — D631 Anemia in chronic kidney disease: Secondary | ICD-10-CM | POA: Diagnosis not present

## 2015-07-10 DIAGNOSIS — Z7721 Contact with and (suspected) exposure to potentially hazardous body fluids: Secondary | ICD-10-CM | POA: Diagnosis not present

## 2015-07-10 DIAGNOSIS — Z114 Encounter for screening for human immunodeficiency virus [HIV]: Secondary | ICD-10-CM | POA: Insufficient documentation

## 2015-07-10 DIAGNOSIS — N186 End stage renal disease: Secondary | ICD-10-CM | POA: Diagnosis not present

## 2015-07-10 DIAGNOSIS — N2581 Secondary hyperparathyroidism of renal origin: Secondary | ICD-10-CM | POA: Diagnosis not present

## 2015-07-10 LAB — CBC
HEMATOCRIT: 35.8 % — AB (ref 36.0–46.0)
HEMOGLOBIN: 11.8 g/dL — AB (ref 12.0–15.0)
MCH: 26.3 pg (ref 26.0–34.0)
MCHC: 33 g/dL (ref 30.0–36.0)
MCV: 79.9 fL (ref 78.0–100.0)
MPV: 9.6 fL (ref 8.6–12.4)
Platelets: 256 10*3/uL (ref 150–400)
RBC: 4.48 MIL/uL (ref 3.87–5.11)
RDW: 16.4 % — AB (ref 11.5–15.5)
WBC: 6.7 10*3/uL (ref 4.0–10.5)

## 2015-07-10 LAB — COMPLETE METABOLIC PANEL WITH GFR
ALBUMIN: 3.8 g/dL (ref 3.6–5.1)
ALK PHOS: 69 U/L (ref 33–115)
ALT: 7 U/L (ref 6–29)
AST: 10 U/L (ref 10–30)
BILIRUBIN TOTAL: 0.3 mg/dL (ref 0.2–1.2)
BUN: 26 mg/dL — ABNORMAL HIGH (ref 7–25)
CO2: 27 mmol/L (ref 20–31)
Calcium: 8.8 mg/dL (ref 8.6–10.2)
Chloride: 94 mmol/L — ABNORMAL LOW (ref 98–110)
Creat: 7.71 mg/dL — ABNORMAL HIGH (ref 0.50–1.10)
GFR, EST AFRICAN AMERICAN: 7 mL/min — AB (ref >=60)
GFR, EST NON AFRICAN AMERICAN: 6 mL/min — AB (ref >=60)
Glucose, Bld: 81 mg/dL (ref 65–99)
Potassium: 3.9 mmol/L (ref 3.5–5.3)
Sodium: 141 mmol/L (ref 135–146)
TOTAL PROTEIN: 7.6 g/dL (ref 6.1–8.1)

## 2015-07-10 LAB — PHOSPHORUS: Phosphorus: 5 mg/dL — ABNORMAL HIGH (ref 2.5–4.5)

## 2015-07-10 LAB — MAGNESIUM: MAGNESIUM: 2.4 mg/dL (ref 1.5–2.5)

## 2015-07-10 LAB — CK: CK TOTAL: 48 U/L (ref 7–177)

## 2015-07-10 NOTE — Progress Notes (Signed)
   Subjective:    Patient ID: Vanessa Alvarez, female    DOB: 07-Feb-1986, 30 y.o.   MRN: [id number]  HPI Complicated woman with significant psychopathology and dialysis requiring end stage renal disease.  Complains of 10-14 days of right leg pain and twitching.  While she starts complaining of knee pain, it is actually the full leg with mostly thigh twitching and pain extending into the anterior shin.  No trauma, bruising or swelling.  No back pain.  No other muscle pains.  Pain is neither worsening nor improving.  Denies numbness.  She is on several meds including statin.  Last labs were several months ago and showed hyperphosphatemia.  She is on a phosphorus binder   Review of Systems     Objective:   Physical Exam Normal right thigh and knee.  No effusion.  No edema.  Ligaments intact to provocative testing.        Assessment & Plan:

## 2015-07-10 NOTE — Assessment & Plan Note (Signed)
Screen hiv

## 2015-07-10 NOTE — Assessment & Plan Note (Addendum)
Also screen hiv.  Puts her at risk for metabolic disturbance.

## 2015-07-10 NOTE — Assessment & Plan Note (Signed)
Broad differential.  While unilateral pain is usually musculoskeletal, I doubt with her lack of physical findings and trauma.  Her pain and distribution sounds mostly like radiculopathy - but she denies back pain.  With her ESRD, I am mostly concerned about metabolic imbalance and or myositis.  Will check labs including CK

## 2015-07-10 NOTE — Patient Instructions (Addendum)
I will call tomorrow with blood test results.  I will wait until I have those results before I make any changes. Thank you for bringing in the medication list.  That helped a lot.

## 2015-07-11 LAB — HIV ANTIBODY (ROUTINE TESTING W REFLEX): HIV 1&2 Ab, 4th Generation: NONREACTIVE

## 2015-07-11 NOTE — Assessment & Plan Note (Signed)
Phosphorus is high.  Will increase sevelamer to 1600 mg with each meal

## 2015-07-12 DIAGNOSIS — D509 Iron deficiency anemia, unspecified: Secondary | ICD-10-CM | POA: Diagnosis not present

## 2015-07-12 DIAGNOSIS — N2581 Secondary hyperparathyroidism of renal origin: Secondary | ICD-10-CM | POA: Diagnosis not present

## 2015-07-12 DIAGNOSIS — N186 End stage renal disease: Secondary | ICD-10-CM | POA: Diagnosis not present

## 2015-07-12 DIAGNOSIS — D631 Anemia in chronic kidney disease: Secondary | ICD-10-CM | POA: Diagnosis not present

## 2015-07-13 ENCOUNTER — Other Ambulatory Visit: Payer: Self-pay | Admitting: Family Medicine

## 2015-07-15 DIAGNOSIS — D509 Iron deficiency anemia, unspecified: Secondary | ICD-10-CM | POA: Diagnosis not present

## 2015-07-15 DIAGNOSIS — N2581 Secondary hyperparathyroidism of renal origin: Secondary | ICD-10-CM | POA: Diagnosis not present

## 2015-07-15 DIAGNOSIS — D631 Anemia in chronic kidney disease: Secondary | ICD-10-CM | POA: Diagnosis not present

## 2015-07-15 DIAGNOSIS — N186 End stage renal disease: Secondary | ICD-10-CM | POA: Diagnosis not present

## 2015-07-16 DIAGNOSIS — Z01818 Encounter for other preprocedural examination: Secondary | ICD-10-CM | POA: Diagnosis not present

## 2015-07-16 DIAGNOSIS — N186 End stage renal disease: Secondary | ICD-10-CM | POA: Diagnosis not present

## 2015-07-17 ENCOUNTER — Emergency Department (HOSPITAL_COMMUNITY)
Admission: EM | Admit: 2015-07-17 | Discharge: 2015-07-17 | Disposition: A | Discharge status: Home / Self Care | Payer: Medicare Other | Attending: Emergency Medicine | Admitting: Emergency Medicine

## 2015-07-17 ENCOUNTER — Encounter (HOSPITAL_COMMUNITY): Payer: Self-pay | Admitting: *Deleted

## 2015-07-17 DIAGNOSIS — N186 End stage renal disease: Secondary | ICD-10-CM | POA: Diagnosis not present

## 2015-07-17 DIAGNOSIS — Y802 Prosthetic and other implants, materials and accessory physical medicine devices associated with adverse incidents: Secondary | ICD-10-CM | POA: Insufficient documentation

## 2015-07-17 DIAGNOSIS — I120 Hypertensive chronic kidney disease with stage 5 chronic kidney disease or end stage renal disease: Secondary | ICD-10-CM | POA: Diagnosis not present

## 2015-07-17 DIAGNOSIS — K219 Gastro-esophageal reflux disease without esophagitis: Secondary | ICD-10-CM | POA: Diagnosis not present

## 2015-07-17 DIAGNOSIS — N2581 Secondary hyperparathyroidism of renal origin: Secondary | ICD-10-CM | POA: Diagnosis not present

## 2015-07-17 DIAGNOSIS — F840 Autistic disorder: Secondary | ICD-10-CM | POA: Diagnosis not present

## 2015-07-17 DIAGNOSIS — L299 Pruritus, unspecified: Secondary | ICD-10-CM

## 2015-07-17 DIAGNOSIS — L989 Disorder of the skin and subcutaneous tissue, unspecified: Secondary | ICD-10-CM | POA: Diagnosis not present

## 2015-07-17 DIAGNOSIS — Z79899 Other long term (current) drug therapy: Secondary | ICD-10-CM | POA: Insufficient documentation

## 2015-07-17 DIAGNOSIS — E039 Hypothyroidism, unspecified: Secondary | ICD-10-CM | POA: Insufficient documentation

## 2015-07-17 DIAGNOSIS — D509 Iron deficiency anemia, unspecified: Secondary | ICD-10-CM | POA: Insufficient documentation

## 2015-07-17 DIAGNOSIS — L298 Other pruritus: Secondary | ICD-10-CM | POA: Diagnosis not present

## 2015-07-17 DIAGNOSIS — E785 Hyperlipidemia, unspecified: Secondary | ICD-10-CM | POA: Diagnosis not present

## 2015-07-17 DIAGNOSIS — T82898A Other specified complication of vascular prosthetic devices, implants and grafts, initial encounter: Secondary | ICD-10-CM | POA: Insufficient documentation

## 2015-07-17 DIAGNOSIS — D631 Anemia in chronic kidney disease: Secondary | ICD-10-CM | POA: Diagnosis not present

## 2015-07-17 DIAGNOSIS — Z992 Dependence on renal dialysis: Secondary | ICD-10-CM | POA: Insufficient documentation

## 2015-07-17 DIAGNOSIS — T148XXA Other injury of unspecified body region, initial encounter: Secondary | ICD-10-CM

## 2015-07-17 LAB — CBC WITH DIFFERENTIAL/PLATELET
Basophils Absolute: 0 10*3/uL (ref 0.0–0.1)
Basophils Relative: 0 %
EOS ABS: 0.3 10*3/uL (ref 0.0–0.7)
EOS PCT: 3 %
HCT: 35.3 % — ABNORMAL LOW (ref 36.0–46.0)
Hemoglobin: 11.3 g/dL — ABNORMAL LOW (ref 12.0–15.0)
LYMPHS ABS: 3.1 10*3/uL (ref 0.7–4.0)
Lymphocytes Relative: 31 %
MCH: 26.5 pg (ref 26.0–34.0)
MCHC: 32 g/dL (ref 30.0–36.0)
MCV: 82.7 fL (ref 78.0–100.0)
MONOS PCT: 7 %
Monocytes Absolute: 0.6 10*3/uL (ref 0.1–1.0)
Neutro Abs: 5.9 10*3/uL (ref 1.7–7.7)
Neutrophils Relative %: 59 %
PLATELETS: 253 10*3/uL (ref 150–400)
RBC: 4.27 MIL/uL (ref 3.87–5.11)
RDW: 16.6 % — ABNORMAL HIGH (ref 11.5–15.5)
WBC: 9.9 10*3/uL (ref 4.0–10.5)

## 2015-07-17 LAB — COMPREHENSIVE METABOLIC PANEL
ALT: 10 U/L — ABNORMAL LOW (ref 14–54)
ANION GAP: 14 (ref 5–15)
AST: 14 U/L — ABNORMAL LOW (ref 15–41)
Albumin: 3.5 g/dL (ref 3.5–5.0)
Alkaline Phosphatase: 68 U/L (ref 38–126)
BUN: 26 mg/dL — ABNORMAL HIGH (ref 6–20)
CHLORIDE: 97 mmol/L — AB (ref 101–111)
CO2: 28 mmol/L (ref 22–32)
Calcium: 9.2 mg/dL (ref 8.9–10.3)
Creatinine, Ser: 7.67 mg/dL — ABNORMAL HIGH (ref 0.44–1.00)
GFR, EST AFRICAN AMERICAN: 7 mL/min — AB (ref >60)
GFR, EST NON AFRICAN AMERICAN: 6 mL/min — AB (ref >60)
Glucose, Bld: 96 mg/dL (ref 65–99)
POTASSIUM: 3.4 mmol/L — AB (ref 3.5–5.1)
SODIUM: 139 mmol/L (ref 135–145)
Total Bilirubin: 0.6 mg/dL (ref 0.3–1.2)
Total Protein: 8.2 g/dL — ABNORMAL HIGH (ref 6.5–8.1)

## 2015-07-17 MED ORDER — HYDROXYZINE HCL 25 MG PO TABS: 25.0000 mg | ORAL_TABLET | Freq: Four times a day (QID) | ORAL | Status: DC | PRN

## 2015-07-17 MED ORDER — HYDROXYZINE HCL 25 MG PO TABS
25.0000 mg | ORAL_TABLET | Freq: Once | ORAL | Status: AC
  Administered 2015-07-17: 25 mg via ORAL
  Filled 2015-07-17: qty 1

## 2015-07-17 NOTE — ED Notes (Signed)
Pt has scratched at area surrounding her dialysis catheter. Some abrasions are open and there is scant amounts of drainage visible.

## 2015-07-17 NOTE — ED Provider Notes (Signed)
CSN: [id number]     Arrival date & time 07/17/15  0002 History   First MD Initiated Contact with Patient 07/17/15 0301     Chief Complaint  Patient presents with  . Pruritis     (Consider location/radiation/quality/duration/timing/severity/associated sxs/prior Treatment) HPI  Itching for approximately 1 month, worse around catheter site on the right, however sometimes it is everywhere. Right now itching just around the catheter.  Havent tried anything for it. No benadryl. Feels like "something is crawling inside." Never had these symptoms before.  No fevers, n/v. Dialysis last Tuesday AM.  No new soaps/detergents/sick contacts.  Used to have dressing over area, however took it off today because it was itching so severely.  Taking off dressing did not seem to help.    Past Medical History  Diagnosis Date  . Hyperlipidemia   . Chronic kidney disease   . Behavioral problems   . Hypertension   . Borderline mental retardation   . Autism spectrum   . Mood disorder [facility])     Sees Dr Silvio Pate, who prescribes meds  . Anemia     Iron Def  . 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 Hypothyroidism   . HYPOTHYROIDISM 03/12/2009    Qualifier: Diagnosis of  By: Earley Favor MD, Cat    . CKD (chronic kidney disease) stage 3, GFR 30-59 ml/min 01/2015  . GERD (gastroesophageal reflux disease)   . Shortness of breath dyspnea     with exertion   Past Surgical History  Procedure Laterality Date  . Cholecystectomy    . Cholecystectomy  11/2004  . Av fistula placement Left 03/17/2015    Procedure: CREATION OF LEFT BRACHIOCEPHALIC ARTERIOVENOUS (AV) FISTULA ;  Surgeon: Conrad Carlisle, MD;  Location: [place];  Service: Vascular;  Laterality: Left;  . Insertion of dialysis catheter Right 03/17/2015    Procedure: INSERTION OF DIALYSIS CATHETER RIGHT INTERNAL JUIGULAR PLACEMENT;  Surgeon: Conrad [place] Harbor, MD;  Location: [place];  Service: Vascular;  Laterality: Right;   Family  History  Problem Relation Age of Onset  . Hypertension Mother   . Diabetes Maternal Grandmother   . Hypertension Maternal Grandmother   . Heart disease Maternal Grandmother   . Cancer Maternal Grandmother   . Diabetes Maternal Grandfather    Social History  Substance Use Topics  . Smoking status: Never Smoker   . Smokeless tobacco: Never Used  . Alcohol Use: No   OB History    Gravida Para Term Preterm AB TAB SAB Ectopic Multiple Living   0 0 0 0 0 0 0 0       Review of Systems  Constitutional: Negative for fever and appetite change.  HENT: Negative for sore throat.   Eyes: Negative for visual disturbance.  Respiratory: Negative for cough and shortness of breath.   Cardiovascular: Negative for chest pain.  Gastrointestinal: Negative for nausea, vomiting and abdominal pain.  Genitourinary: Negative for difficulty urinating.  Musculoskeletal: Negative for back pain and neck pain.  Skin: Positive for rash.  Neurological: Negative for syncope and headaches.      Allergies  Review of patient's allergies indicates no known allergies.  Home Medications   Prior to Admission medications   Medication Sig Start Date End Date Taking? Authorizing Provider  ARIPiprazole (ABILIFY) 5 MG tablet Take 5 mg by mouth at bedtime.   Yes Historical Provider, MD  B Complex-C-Folic Acid (DIALYVITE Q000111Q PO) Take by mouth.  Yes Historical Provider, MD  cinacalcet (SENSIPAR) 30 MG tablet Take 30 mg by mouth daily with supper.   Yes Historical Provider, MD  cyclobenzaprine (FLEXERIL) 10 MG tablet Take 1 tablet (10 mg total) by mouth 2 (two) times daily as needed for muscle spasms. 07/06/15  Yes Davonna Belling, MD  ferrous sulfate 325 (65 FE) MG tablet Take 1 tablet (325 mg total) by mouth 2 (two) times daily. 01/07/12  Yes Clovis Cao, MD  lamoTRIgine (LAMICTAL) 25 MG tablet Take 25-50 mg by mouth 2 (two) times daily. One every morning and 2 each evening.   Yes Historical Provider, MD   levothyroxine (SYNTHROID, LEVOTHROID) 112 MCG tablet TAKE 1 TABLET DAILY BEFORE BREAKFAST. 07/14/15  Yes Ashly M Gottschalk, DO  omeprazole (PRILOSEC) 20 MG capsule TAKE 1 CAPSULE (20 MG TOTAL) BY MOUTH DAILY. 06/09/15  Yes Ashly Windell Moulding, DO  polyethylene glycol powder (GLYCOLAX/MIRALAX) powder Take 17 g by mouth daily. Reported on 05/26/2015 03/03/15  Yes Historical Provider, MD  pravastatin (PRAVACHOL) 20 MG tablet Take 20 mg by mouth at bedtime.   Yes Historical Provider, MD  ranitidine (ZANTAC) 150 MG tablet Take 150 mg by mouth daily.   Yes Historical Provider, MD  sertraline (ZOLOFT) 100 MG tablet Take 200 mg by mouth daily.    Yes Historical Provider, MD  sevelamer carbonate (RENVELA) 800 MG tablet Take 1,600 mg by mouth 3 (three) times daily with meals.   Yes Historical Provider, MD  hydrocortisone cream 0.5 % Apply 1 application topically 2 (two) times daily. Patient not taking: Reported on 07/17/2015 05/26/15   Smiley Houseman, MD  hydrOXYzine (ATARAX/VISTARIL) 25 MG tablet Take 1 tablet (25 mg total) by mouth every 6 (six) hours as needed for anxiety, itching or nausea. 07/17/15   Gareth Morgan, MD  polyethylene glycol (MIRALAX / GLYCOLAX) packet Take 17 g by mouth daily as needed for mild constipation. Patient not taking: Reported on 05/26/2015 03/05/15   Smiley Houseman, MD   BP 150/94 mmHg  Pulse 101  Temp(Src) 98.4 F (36.9 C) (Oral)  Resp 18  SpO2 100%  LMP 06/30/2015 (Exact Date) Physical Exam  Constitutional: She is oriented to person, place, and time. She appears well-developed and well-nourished. No distress.  HENT:  Head: Normocephalic and atraumatic.  Eyes: Conjunctivae and EOM are normal.  Neck: Normal range of motion.  Cardiovascular: Normal rate, regular rhythm, normal heart sounds and intact distal pulses.  Exam reveals no gallop and no friction rub.   No murmur heard. Pulmonary/Chest: Effort normal and breath sounds normal. No respiratory distress. She  has no wheezes. She has no rales.  Abdominal: Soft. She exhibits no distension. There is no tenderness. There is no guarding.  Musculoskeletal: She exhibits no edema or tenderness.  Neurological: She is alert and oriented to person, place, and time.  Skin: Skin is warm and dry. No rash noted. She is not diaphoretic. No erythema.  Healed linear excoriations bilateral arms, legs, scab with no surrounding erythema  Thickened skin/dark plaque surrounding cathter site over right chest, new linear excoriations surrounding this area, no surrounding erythema. No fluctuance over site   Nursing note and vitals reviewed.   ED Course  Procedures (including critical care time) Labs Review Labs Reviewed  CBC WITH DIFFERENTIAL/PLATELET - Abnormal; Notable for the following:    Hemoglobin 11.3 (*)    HCT 35.3 (*)    RDW 16.6 (*)    All other components within normal limits  COMPREHENSIVE METABOLIC PANEL -  Abnormal; Notable for the following:    Potassium 3.4 (*)    Chloride 97 (*)    BUN 26 (*)    Creatinine, Ser 7.67 (*)    Total Protein 8.2 (*)    AST 14 (*)    ALT 10 (*)    GFR calc non Af Amer 6 (*)    GFR calc Af Amer 7 (*)    All other components within normal limits    Imaging Review No results found. I have personally reviewed and evaluated these images and lab results as part of my medical decision-making.   EKG Interpretation None      MDM   Final diagnoses:  Itching  Wound of skin   30yo female with history of hypertension, mood disorder, autism spectrum disorder, hypothyroidism, ESRD on dialysis T-Th-S presents with concern for progressive pruritis.  Reports itching is diffuse however worse surrounding the catheter.  She has areas of excoriation, however no sign of infection.  Labs at baseline for patient. Possible contact dermatitis to catheter or dressing, other pruritis related to ESRD, hyperphosphatemia, hypothyroidism.  No sign of underlying rash. Given atarax with  symptom relief in the ED.  Wrote rx for atarax to use every 6hr as needed for itching and recommend PCP and continued nephrology follow up. Patient discharged in stable condition with understanding of reasons to return.      Gareth Morgan, MD 07/17/15 1451

## 2015-07-17 NOTE — ED Notes (Signed)
Last dialysis was Tuesday, schedule is Tuesday Thursday Saturday. Pt very upset and crying in triage about itching.

## 2015-07-17 NOTE — ED Notes (Signed)
Bandage applied to right upper chest.

## 2015-07-17 NOTE — ED Notes (Signed)
Pt states a dialysis catheter was placed in November to her right upper chest. States that her chest has been itching since they placed the catheter.

## 2015-07-17 NOTE — ED Notes (Signed)
Dr. Schlossman at bedside. 

## 2015-07-18 ENCOUNTER — Ambulatory Visit: Payer: Medicare Other | Admitting: Gastroenterology

## 2015-07-19 DIAGNOSIS — D509 Iron deficiency anemia, unspecified: Secondary | ICD-10-CM | POA: Diagnosis not present

## 2015-07-19 DIAGNOSIS — N2581 Secondary hyperparathyroidism of renal origin: Secondary | ICD-10-CM | POA: Diagnosis not present

## 2015-07-19 DIAGNOSIS — D631 Anemia in chronic kidney disease: Secondary | ICD-10-CM | POA: Diagnosis not present

## 2015-07-19 DIAGNOSIS — N186 End stage renal disease: Secondary | ICD-10-CM | POA: Diagnosis not present

## 2015-07-22 DIAGNOSIS — D509 Iron deficiency anemia, unspecified: Secondary | ICD-10-CM | POA: Diagnosis not present

## 2015-07-22 DIAGNOSIS — N186 End stage renal disease: Secondary | ICD-10-CM | POA: Diagnosis not present

## 2015-07-22 DIAGNOSIS — N2581 Secondary hyperparathyroidism of renal origin: Secondary | ICD-10-CM | POA: Diagnosis not present

## 2015-07-22 DIAGNOSIS — D631 Anemia in chronic kidney disease: Secondary | ICD-10-CM | POA: Diagnosis not present

## 2015-07-24 DIAGNOSIS — N186 End stage renal disease: Secondary | ICD-10-CM | POA: Diagnosis not present

## 2015-07-24 DIAGNOSIS — N2581 Secondary hyperparathyroidism of renal origin: Secondary | ICD-10-CM | POA: Diagnosis not present

## 2015-07-24 DIAGNOSIS — D631 Anemia in chronic kidney disease: Secondary | ICD-10-CM | POA: Diagnosis not present

## 2015-07-24 DIAGNOSIS — D509 Iron deficiency anemia, unspecified: Secondary | ICD-10-CM | POA: Diagnosis not present

## 2015-07-25 DIAGNOSIS — Z992 Dependence on renal dialysis: Secondary | ICD-10-CM | POA: Diagnosis not present

## 2015-07-25 DIAGNOSIS — I158 Other secondary hypertension: Secondary | ICD-10-CM | POA: Diagnosis not present

## 2015-07-25 DIAGNOSIS — N186 End stage renal disease: Secondary | ICD-10-CM | POA: Diagnosis not present

## 2015-07-25 DIAGNOSIS — Z452 Encounter for adjustment and management of vascular access device: Secondary | ICD-10-CM | POA: Diagnosis not present

## 2015-07-26 DIAGNOSIS — N2581 Secondary hyperparathyroidism of renal origin: Secondary | ICD-10-CM | POA: Diagnosis not present

## 2015-07-26 DIAGNOSIS — D631 Anemia in chronic kidney disease: Secondary | ICD-10-CM | POA: Diagnosis not present

## 2015-07-26 DIAGNOSIS — D509 Iron deficiency anemia, unspecified: Secondary | ICD-10-CM | POA: Diagnosis not present

## 2015-07-26 DIAGNOSIS — N186 End stage renal disease: Secondary | ICD-10-CM | POA: Diagnosis not present

## 2015-07-29 ENCOUNTER — Encounter: Payer: Self-pay | Admitting: Gastroenterology

## 2015-07-29 ENCOUNTER — Ambulatory Visit: Payer: Medicare Other | Admitting: Gastroenterology

## 2015-07-29 DIAGNOSIS — N2581 Secondary hyperparathyroidism of renal origin: Secondary | ICD-10-CM | POA: Diagnosis not present

## 2015-07-29 DIAGNOSIS — N186 End stage renal disease: Secondary | ICD-10-CM | POA: Diagnosis not present

## 2015-07-29 DIAGNOSIS — D509 Iron deficiency anemia, unspecified: Secondary | ICD-10-CM | POA: Diagnosis not present

## 2015-07-29 DIAGNOSIS — D631 Anemia in chronic kidney disease: Secondary | ICD-10-CM | POA: Diagnosis not present

## 2015-07-31 DIAGNOSIS — N186 End stage renal disease: Secondary | ICD-10-CM | POA: Diagnosis not present

## 2015-07-31 DIAGNOSIS — N2581 Secondary hyperparathyroidism of renal origin: Secondary | ICD-10-CM | POA: Diagnosis not present

## 2015-07-31 DIAGNOSIS — D631 Anemia in chronic kidney disease: Secondary | ICD-10-CM | POA: Diagnosis not present

## 2015-07-31 DIAGNOSIS — D509 Iron deficiency anemia, unspecified: Secondary | ICD-10-CM | POA: Diagnosis not present

## 2015-08-02 DIAGNOSIS — D631 Anemia in chronic kidney disease: Secondary | ICD-10-CM | POA: Diagnosis not present

## 2015-08-02 DIAGNOSIS — D509 Iron deficiency anemia, unspecified: Secondary | ICD-10-CM | POA: Diagnosis not present

## 2015-08-02 DIAGNOSIS — N2581 Secondary hyperparathyroidism of renal origin: Secondary | ICD-10-CM | POA: Diagnosis not present

## 2015-08-02 DIAGNOSIS — N186 End stage renal disease: Secondary | ICD-10-CM | POA: Diagnosis not present

## 2015-08-05 DIAGNOSIS — D631 Anemia in chronic kidney disease: Secondary | ICD-10-CM | POA: Diagnosis not present

## 2015-08-05 DIAGNOSIS — N186 End stage renal disease: Secondary | ICD-10-CM | POA: Diagnosis not present

## 2015-08-05 DIAGNOSIS — D509 Iron deficiency anemia, unspecified: Secondary | ICD-10-CM | POA: Diagnosis not present

## 2015-08-05 DIAGNOSIS — N2581 Secondary hyperparathyroidism of renal origin: Secondary | ICD-10-CM | POA: Diagnosis not present

## 2015-08-07 DIAGNOSIS — N2581 Secondary hyperparathyroidism of renal origin: Secondary | ICD-10-CM | POA: Diagnosis not present

## 2015-08-07 DIAGNOSIS — N186 End stage renal disease: Secondary | ICD-10-CM | POA: Diagnosis not present

## 2015-08-07 DIAGNOSIS — D509 Iron deficiency anemia, unspecified: Secondary | ICD-10-CM | POA: Diagnosis not present

## 2015-08-07 DIAGNOSIS — D631 Anemia in chronic kidney disease: Secondary | ICD-10-CM | POA: Diagnosis not present

## 2015-08-09 DIAGNOSIS — D631 Anemia in chronic kidney disease: Secondary | ICD-10-CM | POA: Diagnosis not present

## 2015-08-09 DIAGNOSIS — N2581 Secondary hyperparathyroidism of renal origin: Secondary | ICD-10-CM | POA: Diagnosis not present

## 2015-08-09 DIAGNOSIS — D509 Iron deficiency anemia, unspecified: Secondary | ICD-10-CM | POA: Diagnosis not present

## 2015-08-09 DIAGNOSIS — N186 End stage renal disease: Secondary | ICD-10-CM | POA: Diagnosis not present

## 2015-08-11 ENCOUNTER — Ambulatory Visit (INDEPENDENT_AMBULATORY_CARE_PROVIDER_SITE_OTHER): Payer: Medicare Other | Admitting: Gastroenterology

## 2015-08-11 ENCOUNTER — Encounter: Payer: Self-pay | Admitting: Gastroenterology

## 2015-08-11 VITALS — BP 130/80 | HR 88 | Ht 64.5 in | Wt 182.0 lb

## 2015-08-11 DIAGNOSIS — R634 Abnormal weight loss: Secondary | ICD-10-CM | POA: Diagnosis not present

## 2015-08-11 DIAGNOSIS — D649 Anemia, unspecified: Secondary | ICD-10-CM | POA: Diagnosis not present

## 2015-08-11 DIAGNOSIS — R112 Nausea with vomiting, unspecified: Secondary | ICD-10-CM

## 2015-08-11 NOTE — Patient Instructions (Signed)
   You have been scheduled for an endoscopy. Please follow written instructions given to you at your visit today. If you use inhalers (even only as needed), please bring them with you on the day of your procedure. Your physician has requested that you go to www.[email or web address].com and enter the access code given to you at your visit today. This web site gives a general overview about your procedure. However, you should still follow specific instructions given to you by our office regarding your preparation for the procedure.   I appreciate the opportunity to care for you.  

## 2015-08-11 NOTE — Progress Notes (Signed)
HPI :  30 y/o female with a history of ESRD on HD, here in consultation for Dr. Ronnie Doss, for symptoms vomiting  Symptoms of vomiting ongoing for several months. She has vomiting once or twice per week. She often has nausea as well. She thinks her vomiting can usually after eating it will occur. She thinks within an hour or so after eating she can have vomiting. She can't say if specific foods cause this. No abdominal pains. No early satiety. No changes in eating pattern. She thinks she has lost some weight since being on dialysis. She has been on dialysis since [place] of last year, but vomiting preceeded this.. Unclear etiology for her kidney disease. She remains on dialysis. She takes omeprazole 20mg  daily. She denies NSAID use. She is on oral iron pills for history of iron deficiency. She has a history of cholecystectomy. She doesn't take anything for nausea currently, previously tried Zofran and it did not help. She denies any reflux symptoms. No dysphagia.   No FH of colon cancer or gastric cancer.  Mother reports the patient has heavy menses at baseline, possible cause for her anemia. These have been regular recently. No blood in the stools. No constipation or diarrhea or other bowel habit changes.     Past Medical History  Diagnosis Date  . Hyperlipidemia   . Behavioral problems   . Hypertension   . Autism spectrum   . Mood disorder [facility])     Sees Dr Silvio Pate, who prescribes meds  . Anemia     Iron Def  . 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 HYPOTHYROIDISM 03/12/2009    Qualifier: Diagnosis of  By: Earley Favor MD, Cat    . GERD (gastroesophageal reflux disease)   . Shortness of breath dyspnea     with exertion  . Anxiety   . Depression   . End stage renal disease [facility])      Past Surgical History  Procedure Laterality Date  . Cholecystectomy  11/2004  . Av fistula placement Left 03/17/2015    Procedure: CREATION OF LEFT  BRACHIOCEPHALIC ARTERIOVENOUS (AV) FISTULA ;  Surgeon: Conrad Hartline, MD;  Location: [place];  Service: Vascular;  Laterality: Left;  . Insertion of dialysis catheter Right 03/17/2015    Procedure: INSERTION OF DIALYSIS CATHETER RIGHT INTERNAL JUIGULAR PLACEMENT;  Surgeon: Conrad Meire Grove, MD;  Location: [place];  Service: Vascular;  Laterality: Right;   Family History  Problem Relation Age of Onset  . Hypertension Mother   . Diabetes Maternal Grandmother   . Hypertension Maternal Grandmother   . Heart disease Maternal Grandmother   . Bladder Cancer Maternal Grandmother   . Diabetes Maternal Grandfather    Social History  Substance Use Topics  . Smoking status: Never Smoker   . Smokeless tobacco: Never Used  . Alcohol Use: No   Current Outpatient Prescriptions  Medication Sig Dispense Refill  . ARIPiprazole (ABILIFY) 5 MG tablet Take 5 mg by mouth at bedtime.    . B Complex-C-Folic Acid (DIALYVITE Q000111Q PO) Take by mouth.    . cinacalcet (SENSIPAR) 30 MG tablet Take 30 mg by mouth daily with supper.    . cyclobenzaprine (FLEXERIL) 10 MG tablet Take 1 tablet (10 mg total) by mouth 2 (two) times daily as needed for muscle spasms. 10 tablet 0  . ferrous sulfate 325 (65 FE) MG tablet Take 1 tablet (325 mg total) by mouth 2 (two)  times daily. 180 tablet 3  . hydrocortisone cream 0.5 % Apply 1 application topically 2 (two) times daily. 15 g 1  . hydrOXYzine (ATARAX/VISTARIL) 25 MG tablet Take 1 tablet (25 mg total) by mouth every 6 (six) hours as needed for anxiety, itching or nausea. 30 tablet 0  . lamoTRIgine (LAMICTAL) 100 MG tablet Take 100 mg by mouth at bedtime.    Marland Kitchen levothyroxine (SYNTHROID, LEVOTHROID) 112 MCG tablet TAKE 1 TABLET DAILY BEFORE BREAKFAST. 30 tablet 12  . omeprazole (PRILOSEC) 20 MG capsule TAKE 1 CAPSULE (20 MG TOTAL) BY MOUTH DAILY. 30 capsule 3  . polyethylene glycol powder (GLYCOLAX/MIRALAX) powder Take 17 g by mouth daily. Reported on 05/26/2015    . pravastatin (PRAVACHOL)  20 MG tablet Take 20 mg by mouth at bedtime.    . ranitidine (ZANTAC) 150 MG tablet Take 150 mg by mouth daily.    . sertraline (ZOLOFT) 100 MG tablet Take 200 mg by mouth daily.     . sevelamer carbonate (RENVELA) 800 MG tablet Take 1,600 mg by mouth 3 (three) times daily with meals.    . [DISCONTINUED] triamcinolone (KENALOG) 0.5 % cream Apply to affected area 2 times a day. Dispense 60 grams.      No current facility-administered medications for this visit.   No Known Allergies   Review of Systems: All systems reviewed and negative except where noted in HPI.   Lab Results  Component Value Date   WBC 9.9 07/17/2015   HGB 11.3* 07/17/2015   HCT 35.3* 07/17/2015   MCV 82.7 07/17/2015   PLT 253 07/17/2015    Lab Results  Component Value Date   CREATININE 7.67* 07/17/2015   BUN 26* 07/17/2015   NA 139 07/17/2015   K 3.4* 07/17/2015   CL 97* 07/17/2015   CO2 28 07/17/2015    Lab Results  Component Value Date   ALT 10* 07/17/2015   AST 14* 07/17/2015   ALKPHOS 68 07/17/2015   BILITOT 0.6 07/17/2015   Lab Results  Component Value Date   IRON 27* 05/22/2008   TIBC 334 05/22/2008   FERRITIN 60 04/11/2013     Physical Exam: BP 130/80 mmHg  Pulse 88  Ht 5' 4.5" (1.638 m)  Wt 182 lb (82.555 kg)  BMI 30.77 kg/m2  LMP 07/21/2015 Constitutional: Pleasant,well-developed, female in no acute distress. HEENT: Normocephalic and atraumatic. Conjunctivae are normal. No scleral icterus. Neck supple.  Cardiovascular: Normal rate, regular rhythm.  Pulmonary/chest: Effort normal and breath sounds normal. No wheezing, rales or rhonchi. Abdominal: Soft, nondistended, nontender. Bowel sounds active throughout. There are no masses palpable. No hepatomegaly. Extremities: no edema Lymphadenopathy: No cervical adenopathy noted. Neurological: Alert and oriented to person place and time. Skin: Skin is warm and dry. No rashes noted. Psychiatric: Normal mood and affect. Behavior is  normal.   ASSESSMENT AND PLAN: 30 y/o female with ESRD on HD, with ongoing anemia of unclear etiology (perhaps multifactorial due to renal disease and menorrhagia), presenting with intermittent postprandial vomiting and ongoing weight loss. I discussed differential of her symptoms with her and recommend an EGD to initially evaluate this issue, rule out PUD, H pylori, etc. The indications, risks, and benefits of EGD were explained to the patient in detail. Risks include but are not limited to bleeding, perforation, adverse reaction to medications, and cardiopulmonary compromise. Sequelae include but are not limited to the possibility of surgery, hospitalization, and mortality. The patient verbalized understanding and wished to proceed. All questions answered, referred to scheduler.  Further recommendations pending results of the exam. This will be coordinated around her dialysis. She declined trial of other antiemetics at this time, will await result, will be done this week.   [place] Cellar, MD [facility] Pager [phone]  CC: [name], DO

## 2015-08-13 ENCOUNTER — Telehealth: Payer: Self-pay | Admitting: Gastroenterology

## 2015-08-13 NOTE — Telephone Encounter (Signed)
No that's okay. She can reschedule at her convenience. Thanks

## 2015-08-14 ENCOUNTER — Encounter: Payer: Medicare Other | Admitting: Gastroenterology

## 2015-08-14 DIAGNOSIS — N2581 Secondary hyperparathyroidism of renal origin: Secondary | ICD-10-CM | POA: Diagnosis not present

## 2015-08-14 DIAGNOSIS — D631 Anemia in chronic kidney disease: Secondary | ICD-10-CM | POA: Diagnosis not present

## 2015-08-14 DIAGNOSIS — D509 Iron deficiency anemia, unspecified: Secondary | ICD-10-CM | POA: Diagnosis not present

## 2015-08-14 DIAGNOSIS — N186 End stage renal disease: Secondary | ICD-10-CM | POA: Diagnosis not present

## 2015-08-16 DIAGNOSIS — D631 Anemia in chronic kidney disease: Secondary | ICD-10-CM | POA: Diagnosis not present

## 2015-08-16 DIAGNOSIS — N186 End stage renal disease: Secondary | ICD-10-CM | POA: Diagnosis not present

## 2015-08-16 DIAGNOSIS — N2581 Secondary hyperparathyroidism of renal origin: Secondary | ICD-10-CM | POA: Diagnosis not present

## 2015-08-16 DIAGNOSIS — D509 Iron deficiency anemia, unspecified: Secondary | ICD-10-CM | POA: Diagnosis not present

## 2015-08-18 ENCOUNTER — Encounter: Payer: Self-pay | Admitting: Family Medicine

## 2015-08-18 ENCOUNTER — Ambulatory Visit (INDEPENDENT_AMBULATORY_CARE_PROVIDER_SITE_OTHER): Payer: Medicare Other | Admitting: Family Medicine

## 2015-08-18 VITALS — BP 139/78 | HR 97 | Temp 99.1°F | Ht 64.5 in | Wt 181.5 lb

## 2015-08-18 DIAGNOSIS — L298 Other pruritus: Secondary | ICD-10-CM | POA: Diagnosis present

## 2015-08-18 DIAGNOSIS — T829XXA Unspecified complication of cardiac and vascular prosthetic device, implant and graft, initial encounter: Secondary | ICD-10-CM | POA: Insufficient documentation

## 2015-08-18 MED ORDER — TRIAMCINOLONE ACETONIDE 0.5 % EX OINT: 1.0000 "application " | TOPICAL_OINTMENT | Freq: Two times a day (BID) | CUTANEOUS | Status: DC

## 2015-08-18 NOTE — Progress Notes (Signed)
Subjective: Vanessa Alvarez is a 30 y.o. female with a history of eczema and FSGS now with ESRD on HD brought by her mother for itching.   She reports constant, waxing/waning, mild-to-severe itching overlaying the right chest following removal of HD catheter at that site 3/31. There's also an intermittent stabbing-type sensation at the area of the scar but radiating to her right neck that is not related to position, breathing, or exertion. Denies chest pain, palpitations, dyspnea, diaphoresis, fever, chills, redness or drainage. OTC cream has not helped itching.   - ROS: As above - Non-smoker  Objective: BP 139/78 mmHg  Pulse 97  Temp(Src) 99.1 F (37.3 C) (Oral)  Ht 5' 4.5" (1.638 m)  Wt 181 lb 8 oz (82.328 kg)  BMI 30.68 kg/m2  LMP 08/16/2015 Gen: Well-appearing 30 y.o. female in no distress Chest: Right anterior chest wall with hyperpigmentation extending circumferentially irregularly from what is now a keloid without fluctuance. Some shallow abrasions from excoriations noted without drainage, erythema, or swelling. No axillary, cervical, or supraclavicular lymphadenopathy.  CV: Regular rate, no murmur; radial, DP and PT pulses 2+ bilaterally; no LE edema, no JVD, cap refill < 2 sec. Pulm: Non-labored breathing ambient air; CTAB, no wheezes or crackles  Assessment/Plan: Vanessa Alvarez is a 30 y.o. female here for focal pruritus.  Xerotic eczema With exacerbation follow removal of HD catheter in right chest now with keloid and excoriations. Will protect area with gauze and paper tape (provided to pt's mother today) with topical steroid for 1 - 2 weeks. No evidence of infection.

## 2015-08-18 NOTE — Patient Instructions (Signed)
Thank you for coming in today!  Keep the area covered and TRY NOT TO [facility] IT. This is just making it worse. You should apply kenalog ointment twice a day for no more than 2 weeks in a row.   Our clinic's number is [phone]. Feel free to call any time with questions or concerns. We will answer any questions after hours with our 24-hour emergency line at that number as well.   - Dr. Bonner Puna

## 2015-08-18 NOTE — Assessment & Plan Note (Signed)
With exacerbation follow removal of HD catheter in right chest now with keloid and excoriations. Will protect area with gauze and paper tape (provided to pt's mother today) with topical steroid for 1 - 2 weeks. No evidence of infection.

## 2015-08-19 DIAGNOSIS — D631 Anemia in chronic kidney disease: Secondary | ICD-10-CM | POA: Diagnosis not present

## 2015-08-19 DIAGNOSIS — N186 End stage renal disease: Secondary | ICD-10-CM | POA: Diagnosis not present

## 2015-08-19 DIAGNOSIS — N2581 Secondary hyperparathyroidism of renal origin: Secondary | ICD-10-CM | POA: Diagnosis not present

## 2015-08-19 DIAGNOSIS — D509 Iron deficiency anemia, unspecified: Secondary | ICD-10-CM | POA: Diagnosis not present

## 2015-08-23 DIAGNOSIS — D509 Iron deficiency anemia, unspecified: Secondary | ICD-10-CM | POA: Diagnosis not present

## 2015-08-23 DIAGNOSIS — N186 End stage renal disease: Secondary | ICD-10-CM | POA: Diagnosis not present

## 2015-08-23 DIAGNOSIS — D631 Anemia in chronic kidney disease: Secondary | ICD-10-CM | POA: Diagnosis not present

## 2015-08-23 DIAGNOSIS — N2581 Secondary hyperparathyroidism of renal origin: Secondary | ICD-10-CM | POA: Diagnosis not present

## 2015-08-24 DIAGNOSIS — N186 End stage renal disease: Secondary | ICD-10-CM | POA: Diagnosis not present

## 2015-08-24 DIAGNOSIS — Z992 Dependence on renal dialysis: Secondary | ICD-10-CM | POA: Diagnosis not present

## 2015-08-24 DIAGNOSIS — I158 Other secondary hypertension: Secondary | ICD-10-CM | POA: Diagnosis not present

## 2015-08-28 DIAGNOSIS — N186 End stage renal disease: Secondary | ICD-10-CM | POA: Diagnosis not present

## 2015-08-28 DIAGNOSIS — D631 Anemia in chronic kidney disease: Secondary | ICD-10-CM | POA: Diagnosis not present

## 2015-08-28 DIAGNOSIS — D509 Iron deficiency anemia, unspecified: Secondary | ICD-10-CM | POA: Diagnosis not present

## 2015-08-28 DIAGNOSIS — N2581 Secondary hyperparathyroidism of renal origin: Secondary | ICD-10-CM | POA: Diagnosis not present

## 2015-08-30 DIAGNOSIS — N186 End stage renal disease: Secondary | ICD-10-CM | POA: Diagnosis not present

## 2015-08-30 DIAGNOSIS — N2581 Secondary hyperparathyroidism of renal origin: Secondary | ICD-10-CM | POA: Diagnosis not present

## 2015-08-30 DIAGNOSIS — D509 Iron deficiency anemia, unspecified: Secondary | ICD-10-CM | POA: Diagnosis not present

## 2015-08-30 DIAGNOSIS — D631 Anemia in chronic kidney disease: Secondary | ICD-10-CM | POA: Diagnosis not present

## 2015-09-02 DIAGNOSIS — D509 Iron deficiency anemia, unspecified: Secondary | ICD-10-CM | POA: Diagnosis not present

## 2015-09-02 DIAGNOSIS — N2581 Secondary hyperparathyroidism of renal origin: Secondary | ICD-10-CM | POA: Diagnosis not present

## 2015-09-02 DIAGNOSIS — N186 End stage renal disease: Secondary | ICD-10-CM | POA: Diagnosis not present

## 2015-09-02 DIAGNOSIS — D631 Anemia in chronic kidney disease: Secondary | ICD-10-CM | POA: Diagnosis not present

## 2015-09-04 DIAGNOSIS — N186 End stage renal disease: Secondary | ICD-10-CM | POA: Diagnosis not present

## 2015-09-04 DIAGNOSIS — D509 Iron deficiency anemia, unspecified: Secondary | ICD-10-CM | POA: Diagnosis not present

## 2015-09-04 DIAGNOSIS — D631 Anemia in chronic kidney disease: Secondary | ICD-10-CM | POA: Diagnosis not present

## 2015-09-04 DIAGNOSIS — N2581 Secondary hyperparathyroidism of renal origin: Secondary | ICD-10-CM | POA: Diagnosis not present

## 2015-09-06 DIAGNOSIS — D509 Iron deficiency anemia, unspecified: Secondary | ICD-10-CM | POA: Diagnosis not present

## 2015-09-06 DIAGNOSIS — D631 Anemia in chronic kidney disease: Secondary | ICD-10-CM | POA: Diagnosis not present

## 2015-09-06 DIAGNOSIS — N186 End stage renal disease: Secondary | ICD-10-CM | POA: Diagnosis not present

## 2015-09-06 DIAGNOSIS — N2581 Secondary hyperparathyroidism of renal origin: Secondary | ICD-10-CM | POA: Diagnosis not present

## 2015-09-08 ENCOUNTER — Ambulatory Visit: Payer: Medicare Other | Admitting: Family Medicine

## 2015-09-09 DIAGNOSIS — D509 Iron deficiency anemia, unspecified: Secondary | ICD-10-CM | POA: Diagnosis not present

## 2015-09-09 DIAGNOSIS — N186 End stage renal disease: Secondary | ICD-10-CM | POA: Diagnosis not present

## 2015-09-09 DIAGNOSIS — D631 Anemia in chronic kidney disease: Secondary | ICD-10-CM | POA: Diagnosis not present

## 2015-09-09 DIAGNOSIS — N2581 Secondary hyperparathyroidism of renal origin: Secondary | ICD-10-CM | POA: Diagnosis not present

## 2015-09-11 ENCOUNTER — Encounter: Payer: Self-pay | Admitting: *Deleted

## 2015-09-11 DIAGNOSIS — N2581 Secondary hyperparathyroidism of renal origin: Secondary | ICD-10-CM | POA: Diagnosis not present

## 2015-09-11 DIAGNOSIS — N186 End stage renal disease: Secondary | ICD-10-CM | POA: Diagnosis not present

## 2015-09-11 DIAGNOSIS — D509 Iron deficiency anemia, unspecified: Secondary | ICD-10-CM | POA: Diagnosis not present

## 2015-09-11 DIAGNOSIS — D631 Anemia in chronic kidney disease: Secondary | ICD-10-CM | POA: Diagnosis not present

## 2015-09-11 NOTE — Progress Notes (Signed)
Patient ID: Vanessa Alvarez, female   DOB: [birth year], 30 y.o.   MRN: [id number] [name] CRNA reviewed pt's chart- ok for care at [facility]

## 2015-09-13 ENCOUNTER — Other Ambulatory Visit: Payer: Self-pay | Admitting: Family Medicine

## 2015-09-13 DIAGNOSIS — D509 Iron deficiency anemia, unspecified: Secondary | ICD-10-CM | POA: Diagnosis not present

## 2015-09-13 DIAGNOSIS — D631 Anemia in chronic kidney disease: Secondary | ICD-10-CM | POA: Diagnosis not present

## 2015-09-13 DIAGNOSIS — N186 End stage renal disease: Secondary | ICD-10-CM | POA: Diagnosis not present

## 2015-09-13 DIAGNOSIS — N2581 Secondary hyperparathyroidism of renal origin: Secondary | ICD-10-CM | POA: Diagnosis not present

## 2015-09-15 ENCOUNTER — Ambulatory Visit (AMBULATORY_SURGERY_CENTER): Payer: Medicare Other | Admitting: Gastroenterology

## 2015-09-15 ENCOUNTER — Encounter: Payer: Self-pay | Admitting: Gastroenterology

## 2015-09-15 VITALS — BP 128/66 | HR 93 | Temp 98.0°F | Resp 17 | Ht 64.0 in | Wt 182.0 lb

## 2015-09-15 DIAGNOSIS — K317 Polyp of stomach and duodenum: Secondary | ICD-10-CM

## 2015-09-15 DIAGNOSIS — K2970 Gastritis, unspecified, without bleeding: Secondary | ICD-10-CM

## 2015-09-15 DIAGNOSIS — N186 End stage renal disease: Secondary | ICD-10-CM | POA: Diagnosis not present

## 2015-09-15 DIAGNOSIS — R1115 Cyclical vomiting syndrome unrelated to migraine: Secondary | ICD-10-CM

## 2015-09-15 DIAGNOSIS — K2990 Gastroduodenitis, unspecified, without bleeding: Secondary | ICD-10-CM

## 2015-09-15 DIAGNOSIS — K2950 Unspecified chronic gastritis without bleeding: Secondary | ICD-10-CM | POA: Diagnosis not present

## 2015-09-15 DIAGNOSIS — G43A1 Cyclical vomiting, intractable: Secondary | ICD-10-CM | POA: Diagnosis not present

## 2015-09-15 DIAGNOSIS — R1110 Vomiting, unspecified: Secondary | ICD-10-CM | POA: Diagnosis not present

## 2015-09-15 DIAGNOSIS — K3189 Other diseases of stomach and duodenum: Secondary | ICD-10-CM | POA: Diagnosis not present

## 2015-09-15 DIAGNOSIS — F840 Autistic disorder: Secondary | ICD-10-CM | POA: Diagnosis not present

## 2015-09-15 MED ORDER — SODIUM CHLORIDE 0.9 % IV SOLN: 500.0000 mL | INTRAVENOUS | Status: DC

## 2015-09-15 NOTE — Patient Instructions (Signed)
YOU HAD AN ENDOSCOPIC PROCEDURE TODAY AT THE [facility]:   Refer to the procedure report that was given to you for any specific questions about what was found during the examination.  If the procedure report does not answer your questions, please call your gastroenterologist to clarify.  If you requested that your care partner not be given the details of your procedure findings, then the procedure report has been included in a sealed envelope for you to review at your convenience later.  YOU SHOULD EXPECT: Some feelings of bloating in the abdomen. Passage of more gas than usual.  Walking can help get rid of the air that was put into your GI tract during the procedure and reduce the bloating. If you had a lower endoscopy (such as a colonoscopy or flexible sigmoidoscopy) you may notice spotting of blood in your stool or on the toilet paper. If you underwent a bowel prep for your procedure, you may not have a normal bowel movement for a few days.  Please Note:  You might notice some irritation and congestion in your nose or some drainage.  This is from the oxygen used during your procedure.  There is no need for concern and it should clear up in a day or so.  SYMPTOMS TO REPORT IMMEDIATELY:    Following upper endoscopy (EGD)  Vomiting of blood or coffee ground material  New chest pain or pain under the shoulder blades  Painful or persistently difficult swallowing  New shortness of breath  Fever of 100F or higher  Black, tarry-looking stools  For urgent or emergent issues, a gastroenterologist can be reached at any hour by calling [phone].   DIET: Your first meal following the procedure should be a small meal and then it is ok to progress to your normal diet. Heavy or fried foods are harder to digest and may make you feel nauseous or bloated.  Likewise, meals heavy in dairy and vegetables can increase bloating.  Drink plenty of fluids but you should avoid alcoholic beverages  for 24 hours.  ACTIVITY:  You should plan to take it easy for the rest of today and you should NOT DRIVE or use heavy machinery until tomorrow (because of the sedation medicines used during the test).    FOLLOW UP: Our staff will call the number listed on your records the next business day following your procedure to check on you and address any questions or concerns that you may have regarding the information given to you following your procedure. If we do not reach you, we will leave a message.  However, if you are feeling well and you are not experiencing any problems, there is no need to return our call.  We will assume that you have returned to your regular daily activities without incident.  If any biopsies were taken you will be contacted by phone or by letter within the next 1-3 weeks.  Please call us at [phone] if you have not heard about the biopsies in 3 weeks.    SIGNATURES/CONFIDENTIALITY: You and/or your care partner have signed paperwork which will be entered into your electronic medical record.  These signatures attest to the fact that that the information above on your After Visit Summary has been reviewed and is understood.  Full responsibility of the confidentiality of this discharge information lies with you and/or your care-partner. 

## 2015-09-15 NOTE — Progress Notes (Signed)
Called to room to assist during endoscopic procedure.  Patient ID and intended procedure confirmed with present staff. Received instructions for my participation in the procedure from the performing physician.  

## 2015-09-15 NOTE — Progress Notes (Signed)
A/ox3, pleased with MAC, report to RN 

## 2015-09-15 NOTE — Op Note (Signed)
[facility] Patient Name: Vanessa Alvarez Procedure Date: 09/15/2015 10:30 AM MRN: [id number] Endoscopist: Remo Lipps P. Havery Moros , MD Age: 30 Referring MD:  Date of Birth: 18-Mar-1986 Gender: Female Procedure:                Upper GI endoscopy Indications:              Nausea with vomiting Medicines:                Monitored Anesthesia Care Procedure:                Pre-Anesthesia Assessment:                           - Prior to the procedure, a History and Physical                            was performed, and patient medications and                            allergies were reviewed. The patient's tolerance of                            previous anesthesia was also reviewed. The risks                            and benefits of the procedure and the sedation                            options and risks were discussed with the patient.                            All questions were answered, and informed consent                            was obtained. Prior Anticoagulants: The patient has                            taken no previous anticoagulant or antiplatelet                            agents. ASA Grade Assessment: III - A patient with                            severe systemic disease. After reviewing the risks                            and benefits, the patient was deemed in                            satisfactory condition to undergo the procedure.                           After obtaining informed consent, the endoscope was  passed under direct vision. Throughout the                            procedure, the patient's blood pressure, pulse, and                            oxygen saturations were monitored continuously. The                            Model GIF-[id number]) scope was introduced                            through the mouth, and advanced to the second part                            of duodenum. The upper GI endoscopy was                        accomplished without difficulty. The patient                            tolerated the procedure well. Scope In: Scope Out: Findings:                 Esophagogastric landmarks were identified: the                            Z-line was found at 39 cm, the gastroesophageal                            junction was found at 39 cm and the upper extent of                            the gastric folds was found at 39 cm from the                            incisors.                           The exam of the esophagus was otherwise normal.                           A few diminutive sessile polyps were found in the                            gastric fundus and in the gastric body, grossly                            consistent with benign fundic gland polyps. One                            representative polyp was removed with a cold biopsy                            forceps.  Resection and retrieval were complete.                           Patchy mildly erythematous mucosa without bleeding                            was found in the gastric antrum. Biopsies were                            taken with a cold forceps for Helicobacter pylori                            testing.                           The exam of the stomach was otherwise normal.                           Localized nodular mucosa was found in the duodenal                            bulb which I suspect is benign ectopic gastric                            mucosa. Biopsies were taken with a cold forceps for                            histology.                           The exam of the duodenum was otherwise normal. Complications:            No immediate complications. Estimated blood loss:                            Minimal. Estimated Blood Loss:     Estimated blood loss was minimal. Impression:               - Esophagogastric landmarks identified.                           - A few gastric polyps. Resected and  retrieved.                           - Erythematous mucosa in the antrum. Biopsied.                           - Nodular mucosa in the duodenal bulb. Biopsied.                           Overall, no overt significant pathology noted to                            account for the patient's symptoms, but will await  biopsies to rule out H pylori Recommendation:           - Patient has a contact number available for                            emergencies. The signs and symptoms of potential                            delayed complications were discussed with the                            patient. Return to normal activities tomorrow.                            Written discharge instructions were provided to the                            patient.                           - Resume previous diet.                           - Continue present medications.                           - Await pathology results.                           - No repeat upper endoscopy. Remo Lipps P. Havery Moros, MD 09/15/2015 10:55:22 AM This report has been signed electronically.

## 2015-09-16 ENCOUNTER — Telehealth: Payer: Self-pay | Admitting: *Deleted

## 2015-09-16 DIAGNOSIS — N186 End stage renal disease: Secondary | ICD-10-CM | POA: Diagnosis not present

## 2015-09-16 DIAGNOSIS — D631 Anemia in chronic kidney disease: Secondary | ICD-10-CM | POA: Diagnosis not present

## 2015-09-16 DIAGNOSIS — D509 Iron deficiency anemia, unspecified: Secondary | ICD-10-CM | POA: Diagnosis not present

## 2015-09-16 DIAGNOSIS — N2581 Secondary hyperparathyroidism of renal origin: Secondary | ICD-10-CM | POA: Diagnosis not present

## 2015-09-16 NOTE — Telephone Encounter (Signed)
  Follow up Call-  Call back number 09/15/2015  Post procedure Call Back phone  # [phone]  Permission to leave phone message Yes     Patient questions:  Do you have a fever, pain , or abdominal swelling? No. Pain Score  0 *  Have you tolerated food without any problems? Yes.    Have you been able to return to your normal activities? No.  Do you have any questions about your discharge instructions: Diet   No. Medications  No. Follow up visit  No.  Do you have questions or concerns about your Care? No.  Actions: * If pain score is 4 or above: No action needed, pain <4.

## 2015-09-19 DIAGNOSIS — F71 Moderate intellectual disabilities: Secondary | ICD-10-CM | POA: Diagnosis not present

## 2015-09-19 DIAGNOSIS — F319 Bipolar disorder, unspecified: Secondary | ICD-10-CM | POA: Diagnosis not present

## 2015-09-20 DIAGNOSIS — N186 End stage renal disease: Secondary | ICD-10-CM | POA: Diagnosis not present

## 2015-09-20 DIAGNOSIS — D509 Iron deficiency anemia, unspecified: Secondary | ICD-10-CM | POA: Diagnosis not present

## 2015-09-20 DIAGNOSIS — N2581 Secondary hyperparathyroidism of renal origin: Secondary | ICD-10-CM | POA: Diagnosis not present

## 2015-09-20 DIAGNOSIS — D631 Anemia in chronic kidney disease: Secondary | ICD-10-CM | POA: Diagnosis not present

## 2015-09-23 DIAGNOSIS — D631 Anemia in chronic kidney disease: Secondary | ICD-10-CM | POA: Diagnosis not present

## 2015-09-23 DIAGNOSIS — D509 Iron deficiency anemia, unspecified: Secondary | ICD-10-CM | POA: Diagnosis not present

## 2015-09-23 DIAGNOSIS — N186 End stage renal disease: Secondary | ICD-10-CM | POA: Diagnosis not present

## 2015-09-23 DIAGNOSIS — N2581 Secondary hyperparathyroidism of renal origin: Secondary | ICD-10-CM | POA: Diagnosis not present

## 2015-09-24 DIAGNOSIS — N186 End stage renal disease: Secondary | ICD-10-CM | POA: Diagnosis not present

## 2015-09-24 DIAGNOSIS — I158 Other secondary hypertension: Secondary | ICD-10-CM | POA: Diagnosis not present

## 2015-09-24 DIAGNOSIS — Z992 Dependence on renal dialysis: Secondary | ICD-10-CM | POA: Diagnosis not present

## 2015-09-25 DIAGNOSIS — Z23 Encounter for immunization: Secondary | ICD-10-CM | POA: Diagnosis not present

## 2015-09-25 DIAGNOSIS — N186 End stage renal disease: Secondary | ICD-10-CM | POA: Diagnosis not present

## 2015-09-25 DIAGNOSIS — D631 Anemia in chronic kidney disease: Secondary | ICD-10-CM | POA: Diagnosis not present

## 2015-09-25 DIAGNOSIS — N2581 Secondary hyperparathyroidism of renal origin: Secondary | ICD-10-CM | POA: Diagnosis not present

## 2015-09-27 DIAGNOSIS — N186 End stage renal disease: Secondary | ICD-10-CM | POA: Diagnosis not present

## 2015-09-27 DIAGNOSIS — D631 Anemia in chronic kidney disease: Secondary | ICD-10-CM | POA: Diagnosis not present

## 2015-09-27 DIAGNOSIS — N2581 Secondary hyperparathyroidism of renal origin: Secondary | ICD-10-CM | POA: Diagnosis not present

## 2015-09-27 DIAGNOSIS — Z23 Encounter for immunization: Secondary | ICD-10-CM | POA: Diagnosis not present

## 2015-10-02 DIAGNOSIS — Z23 Encounter for immunization: Secondary | ICD-10-CM | POA: Diagnosis not present

## 2015-10-02 DIAGNOSIS — D631 Anemia in chronic kidney disease: Secondary | ICD-10-CM | POA: Diagnosis not present

## 2015-10-02 DIAGNOSIS — N186 End stage renal disease: Secondary | ICD-10-CM | POA: Diagnosis not present

## 2015-10-02 DIAGNOSIS — N2581 Secondary hyperparathyroidism of renal origin: Secondary | ICD-10-CM | POA: Diagnosis not present

## 2015-10-04 DIAGNOSIS — Z23 Encounter for immunization: Secondary | ICD-10-CM | POA: Diagnosis not present

## 2015-10-04 DIAGNOSIS — N186 End stage renal disease: Secondary | ICD-10-CM | POA: Diagnosis not present

## 2015-10-04 DIAGNOSIS — N2581 Secondary hyperparathyroidism of renal origin: Secondary | ICD-10-CM | POA: Diagnosis not present

## 2015-10-04 DIAGNOSIS — D631 Anemia in chronic kidney disease: Secondary | ICD-10-CM | POA: Diagnosis not present

## 2015-10-05 ENCOUNTER — Other Ambulatory Visit: Payer: Self-pay | Admitting: Family Medicine

## 2015-10-06 ENCOUNTER — Ambulatory Visit (INDEPENDENT_AMBULATORY_CARE_PROVIDER_SITE_OTHER): Payer: Medicare Other | Admitting: Family Medicine

## 2015-10-06 ENCOUNTER — Encounter: Payer: Self-pay | Admitting: Family Medicine

## 2015-10-06 VITALS — BP 136/87 | HR 94 | Temp 98.7°F | Ht 64.0 in | Wt 174.0 lb

## 2015-10-06 DIAGNOSIS — N186 End stage renal disease: Secondary | ICD-10-CM | POA: Diagnosis not present

## 2015-10-06 DIAGNOSIS — L299 Pruritus, unspecified: Secondary | ICD-10-CM | POA: Diagnosis not present

## 2015-10-06 DIAGNOSIS — G43D0 Abdominal migraine, not intractable: Secondary | ICD-10-CM | POA: Diagnosis not present

## 2015-10-06 DIAGNOSIS — Z992 Dependence on renal dialysis: Secondary | ICD-10-CM | POA: Diagnosis not present

## 2015-10-06 MED ORDER — SUMATRIPTAN SUCCINATE 25 MG PO TABS: 25.0000 mg | ORAL_TABLET | ORAL | Status: DC | PRN

## 2015-10-06 NOTE — Patient Instructions (Addendum)
I have sent in Imitrex tablets.  You can take ONE tablet at onset of migraine.  If migraine is note better in 2 hours, you may repeat dose ONCE.  You can continue taking Tylenol as well if needed.  Concerning your itching, you can take Hydroxyzine for this OR you can apply Benadryl cream as directed as needed to area.  Migraine Headache A migraine headache is an intense, throbbing pain on one or both sides of your head. A migraine can last for 30 minutes to several hours. CAUSES  The exact cause of a migraine headache is not always known. However, a migraine may be caused when nerves in the brain become irritated and release chemicals that cause inflammation. This causes pain. Certain things may also trigger migraines, such as:  Alcohol.  Smoking.  Stress.  Menstruation.  Aged cheeses.  Foods or drinks that contain nitrates, glutamate, aspartame, or tyramine.  Lack of sleep.  Chocolate.  Caffeine.  Hunger.  Physical exertion.  Fatigue.  Medicines used to treat chest pain (nitroglycerine), birth control pills, estrogen, and some blood pressure medicines. SIGNS AND SYMPTOMS  Pain on one or both sides of your head.  Pulsating or throbbing pain.  Severe pain that prevents daily activities.  Pain that is aggravated by any physical activity.  Nausea, vomiting, or both.  Dizziness.  Pain with exposure to bright lights, loud noises, or activity.  General sensitivity to bright lights, loud noises, or smells. Before you get a migraine, you may get warning signs that a migraine is coming (aura). An aura may include:  Seeing flashing lights.  Seeing bright spots, halos, or zigzag lines.  Having tunnel vision or blurred vision.  Having feelings of numbness or tingling.  Having trouble talking.  Having muscle weakness. DIAGNOSIS  A migraine headache is often diagnosed based on:  Symptoms.  Physical exam.  A CT scan or MRI of your head. These imaging tests  cannot diagnose migraines, but they can help rule out other causes of headaches. TREATMENT Medicines may be given for pain and nausea. Medicines can also be given to help prevent recurrent migraines.  HOME CARE INSTRUCTIONS  Only take over-the-counter or prescription medicines for pain or discomfort as directed by your health care provider. The use of long-term narcotics is not recommended.  Lie down in a dark, quiet room when you have a migraine.  Keep a journal to find out what may trigger your migraine headaches. For example, write down:  What you eat and drink.  How much sleep you get.  Any change to your diet or medicines.  Limit alcohol consumption.  Quit smoking if you smoke.  Get 7-9 hours of sleep, or as recommended by your health care provider.  Limit stress.  Keep lights dim if bright lights bother you and make your migraines worse. SEEK IMMEDIATE MEDICAL CARE IF:   Your migraine becomes severe.  You have a fever.  You have a stiff neck.  You have vision loss.  You have muscular weakness or loss of muscle control.  You start losing your balance or have trouble walking.  You feel faint or pass out.  You have severe symptoms that are different from your first symptoms. MAKE SURE YOU:   Understand these instructions.  Will watch your condition.  Will get help right away if you are not doing well or get worse.   This information is not intended to replace advice given to you by your health care provider. Make sure you  discuss any questions you have with your health care provider.   Document Released: 04/12/2005 Document Revised: 05/03/2014 Document Reviewed: 12/18/2012 Elsevier Interactive Patient Education [employer].

## 2015-10-06 NOTE — Progress Notes (Signed)
Subjective: CC: right sided headache HPI: Vanessa Alvarez is a 30 y.o. female presenting to clinic today for office visit. Concerns today include:  1. Headache Patient notes that she developed a right sided headache about 2-3 months ago.  She notes that headache is throbbing.  Headache comes and goes.  She reports that it seems to come at random.  Headache lasts up to an hour at a time and occur about 2 times per week.  She reports photophobia.  Denies phonophobia, dizziness, blurred vision.  She reports associated nausea and vomiting.  She is taking Tylenol for her headache.  She notes that this sometimes helps but not always.  Drinks tea once daily up to 4 times weekly.  Does not consume chocolate regularly.  Does consume processed meats.  She reports that she sleeps well at night.  Mother has a history of migraine headaches.   2. Pruritis She notes itching at her catheter site (no longer using).  She was prescribed triamcinolone but discontinued use after about 2 weeks.  Describes itching as right sided and traveling down her torso deep inside.  3. ESRD on HD She is seeing Dr Marval Regal for HD on T Th Sat.  She has a Left sided fistula that she started using about the end of March.  HD going well.  No concerns at this time.  Compliant with renal medications.  Social History Reviewed: non smoker. FamHx and MedHx reviewed.  Please see EMR.  ROS: Per HPI  Objective: Office vital signs reviewed. BP 136/87 mmHg  Pulse 94  Temp(Src) 98.7 F (37.1 C) (Oral)  Ht 5\' 4"  (1.626 m)  Wt 174 lb (78.926 kg)  BMI 29.85 kg/m2  LMP 10/03/2015 (Approximate)  Physical Examination:  General: Awake, alert, well nourished (looks to have lost some weight since last visit), No acute distress HEENT: PERRL, exotropia noted Cardio: regular rate and rhythm, S1S2 heard, no murmurs appreciated Pulm: clear to auscultation bilaterally, no wheezes, rhonchi or rales, normal WOB on room air Skin: dry,  intact, no rashes or lesions, well healed scar without evidence of infection on right side of chest.  Left upper extremity with fistula.  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 female   1. Abdominal migraine, not intractable.  Based on HPI and family history have a high suspicion of migraine headache.  Alternate DDx tension headache vs HD induced - Continue Tylenol prn headache.  Instructed to use this first then if no improvement, use Imitrex. - SUMAtriptan (IMITREX) 25 MG tablet; Take 1 tablet (25 mg total) by mouth every 2 (two) hours as needed for migraine. May repeat in 2 hours ONCE if headache persists or recurs.  Dispense: 10 tablet; Refill: 1 - Avoid triggers - Patient to maintain a headache diary - Will consider increasing dose of Imitrex if 25mg  insufficient.  Discussed starting low and slow in the setting of ESRD, though medication per Uptodate is not renally metabolized. - Considered NSAID in the setting of ESRD on HD but patient started on HD within last 6 months, so avoiding this for now. - Could consider referral to neuro vs headache clinic if refractory to above treatments - Return precautions reviewed with patient and her mother  2. Pruritus.  No rash on exam.  Patient has well healed scar on right side of chest.  I suspect she has some nerve irritation from previous HD catheter vs from scar formation. - Reassurance, suspect that this will improve as scar softens. - May use hydroxyzine prn itching - Consider TOPICAL benadryl cream prn  3. ESRD on HD: doing well.  Seeing Dr Marval Regal at [facility].  Has HD T Th S.   - continue HD - continue renal medications - will avoid NSAIDs for now.  Follow up in 3 months or sooner if needed.   [name], DO PGY-2, [place]

## 2015-10-07 DIAGNOSIS — Z23 Encounter for immunization: Secondary | ICD-10-CM | POA: Diagnosis not present

## 2015-10-07 DIAGNOSIS — D631 Anemia in chronic kidney disease: Secondary | ICD-10-CM | POA: Diagnosis not present

## 2015-10-07 DIAGNOSIS — N2581 Secondary hyperparathyroidism of renal origin: Secondary | ICD-10-CM | POA: Diagnosis not present

## 2015-10-07 DIAGNOSIS — N186 End stage renal disease: Secondary | ICD-10-CM | POA: Diagnosis not present

## 2015-10-09 DIAGNOSIS — D631 Anemia in chronic kidney disease: Secondary | ICD-10-CM | POA: Diagnosis not present

## 2015-10-09 DIAGNOSIS — N186 End stage renal disease: Secondary | ICD-10-CM | POA: Diagnosis not present

## 2015-10-09 DIAGNOSIS — Z23 Encounter for immunization: Secondary | ICD-10-CM | POA: Diagnosis not present

## 2015-10-09 DIAGNOSIS — N2581 Secondary hyperparathyroidism of renal origin: Secondary | ICD-10-CM | POA: Diagnosis not present

## 2015-10-11 DIAGNOSIS — Z23 Encounter for immunization: Secondary | ICD-10-CM | POA: Diagnosis not present

## 2015-10-11 DIAGNOSIS — N186 End stage renal disease: Secondary | ICD-10-CM | POA: Diagnosis not present

## 2015-10-11 DIAGNOSIS — D631 Anemia in chronic kidney disease: Secondary | ICD-10-CM | POA: Diagnosis not present

## 2015-10-11 DIAGNOSIS — N2581 Secondary hyperparathyroidism of renal origin: Secondary | ICD-10-CM | POA: Diagnosis not present

## 2015-10-15 ENCOUNTER — Inpatient Hospital Stay (HOSPITAL_COMMUNITY)
Admission: EM | Admit: 2015-10-15 | Discharge: 2015-10-17 | DRG: 682 | Disposition: A | Discharge status: Home / Self Care | Payer: Medicare Other | Attending: Internal Medicine | Admitting: Internal Medicine

## 2015-10-15 ENCOUNTER — Encounter (HOSPITAL_COMMUNITY): Payer: Self-pay | Admitting: *Deleted

## 2015-10-15 DIAGNOSIS — F419 Anxiety disorder, unspecified: Secondary | ICD-10-CM | POA: Diagnosis present

## 2015-10-15 DIAGNOSIS — R0602 Shortness of breath: Secondary | ICD-10-CM | POA: Diagnosis not present

## 2015-10-15 DIAGNOSIS — Z992 Dependence on renal dialysis: Secondary | ICD-10-CM | POA: Diagnosis not present

## 2015-10-15 DIAGNOSIS — F79 Unspecified intellectual disabilities: Secondary | ICD-10-CM | POA: Diagnosis present

## 2015-10-15 DIAGNOSIS — N186 End stage renal disease: Secondary | ICD-10-CM | POA: Diagnosis present

## 2015-10-15 DIAGNOSIS — Z9119 Patient's noncompliance with other medical treatment and regimen: Secondary | ICD-10-CM | POA: Diagnosis not present

## 2015-10-15 DIAGNOSIS — K219 Gastro-esophageal reflux disease without esophagitis: Secondary | ICD-10-CM | POA: Diagnosis present

## 2015-10-15 DIAGNOSIS — E8889 Other specified metabolic disorders: Secondary | ICD-10-CM | POA: Diagnosis not present

## 2015-10-15 DIAGNOSIS — F840 Autistic disorder: Secondary | ICD-10-CM | POA: Diagnosis present

## 2015-10-15 DIAGNOSIS — D638 Anemia in other chronic diseases classified elsewhere: Secondary | ICD-10-CM | POA: Diagnosis not present

## 2015-10-15 DIAGNOSIS — N2581 Secondary hyperparathyroidism of renal origin: Secondary | ICD-10-CM | POA: Diagnosis present

## 2015-10-15 DIAGNOSIS — Z046 Encounter for general psychiatric examination, requested by authority: Secondary | ICD-10-CM | POA: Insufficient documentation

## 2015-10-15 DIAGNOSIS — F22 Delusional disorders: Secondary | ICD-10-CM | POA: Diagnosis present

## 2015-10-15 DIAGNOSIS — I120 Hypertensive chronic kidney disease with stage 5 chronic kidney disease or end stage renal disease: Principal | ICD-10-CM | POA: Diagnosis present

## 2015-10-15 DIAGNOSIS — E039 Hypothyroidism, unspecified: Secondary | ICD-10-CM | POA: Diagnosis not present

## 2015-10-15 DIAGNOSIS — N189 Chronic kidney disease, unspecified: Secondary | ICD-10-CM | POA: Diagnosis not present

## 2015-10-15 DIAGNOSIS — E785 Hyperlipidemia, unspecified: Secondary | ICD-10-CM | POA: Diagnosis not present

## 2015-10-15 DIAGNOSIS — F329 Major depressive disorder, single episode, unspecified: Secondary | ICD-10-CM | POA: Diagnosis not present

## 2015-10-15 DIAGNOSIS — Z9115 Patient's noncompliance with renal dialysis: Secondary | ICD-10-CM

## 2015-10-15 DIAGNOSIS — F600 Paranoid personality disorder: Secondary | ICD-10-CM | POA: Diagnosis not present

## 2015-10-15 DIAGNOSIS — I129 Hypertensive chronic kidney disease with stage 1 through stage 4 chronic kidney disease, or unspecified chronic kidney disease: Secondary | ICD-10-CM | POA: Diagnosis not present

## 2015-10-15 DIAGNOSIS — R258 Other abnormal involuntary movements: Secondary | ICD-10-CM | POA: Diagnosis not present

## 2015-10-15 DIAGNOSIS — Z91158 Patient's noncompliance with renal dialysis for other reason: Secondary | ICD-10-CM

## 2015-10-15 DIAGNOSIS — F39 Unspecified mood [affective] disorder: Secondary | ICD-10-CM | POA: Diagnosis not present

## 2015-10-15 LAB — COMPREHENSIVE METABOLIC PANEL
ALT: 10 U/L — ABNORMAL LOW (ref 14–54)
AST: 11 U/L — AB (ref 15–41)
Albumin: 4.3 g/dL (ref 3.5–5.0)
Alkaline Phosphatase: 69 U/L (ref 38–126)
Anion gap: 12 (ref 5–15)
BILIRUBIN TOTAL: 0.5 mg/dL (ref 0.3–1.2)
BUN: 43 mg/dL — AB (ref 6–20)
CO2: 22 mmol/L (ref 22–32)
Calcium: 8.9 mg/dL (ref 8.9–10.3)
Chloride: 104 mmol/L (ref 101–111)
Creatinine, Ser: 10.39 mg/dL — ABNORMAL HIGH (ref 0.44–1.00)
GFR calc Af Amer: 5 mL/min — ABNORMAL LOW (ref >60)
GFR, EST NON AFRICAN AMERICAN: 4 mL/min — AB (ref >60)
Glucose, Bld: 97 mg/dL (ref 65–99)
POTASSIUM: 4 mmol/L (ref 3.5–5.1)
Sodium: 138 mmol/L (ref 135–145)
Total Protein: 8.2 g/dL — ABNORMAL HIGH (ref 6.5–8.1)

## 2015-10-15 LAB — ACETAMINOPHEN LEVEL: Acetaminophen (Tylenol), Serum: <10 ug/mL — ABNORMAL LOW (ref 10–30)

## 2015-10-15 LAB — ETHANOL: Alcohol, Ethyl (B): <5 mg/dL (ref <5)

## 2015-10-15 LAB — CBC
HCT: 31.3 % — ABNORMAL LOW (ref 36.0–46.0)
Hemoglobin: 10.5 g/dL — ABNORMAL LOW (ref 12.0–15.0)
MCH: 27.1 pg (ref 26.0–34.0)
MCHC: 33.5 g/dL (ref 30.0–36.0)
MCV: 80.7 fL (ref 78.0–100.0)
PLATELETS: 206 10*3/uL (ref 150–400)
RBC: 3.88 MIL/uL (ref 3.87–5.11)
RDW: 14.9 % (ref 11.5–15.5)
WBC: 8.9 10*3/uL (ref 4.0–10.5)

## 2015-10-15 LAB — SALICYLATE LEVEL: Salicylate Lvl: <4 mg/dL (ref 2.8–30.0)

## 2015-10-15 NOTE — ED Notes (Signed)
Pt mother states pt has had anger, anxiety issues for several months. Pt says she is upset/depressed about dialysis (started in November). Pt mother says that the pt has been refusing to go to dialysis (last treatment Saturday).  Pt denies SI or HI. She is a patient of [facility], last saw them in May, has been taking medications as prescribed.

## 2015-10-15 NOTE — ED Notes (Signed)
Pt does have IVC papers stating pt has been paranoid and uncooperative with dialysis, ripped the needle from the treatment out of her arm and did not show up for scheduled treatment yesterday.

## 2015-10-15 NOTE — ED Notes (Signed)
Discussed with kelle, PA that pt hr is still 110-112 and reported vomiting x 1

## 2015-10-15 NOTE — ED Notes (Signed)
Pt has been wanded by security, changed to paper scrubs, and mother took pt belongings.

## 2015-10-15 NOTE — ED Provider Notes (Signed)
CSN: [id number]     Arrival date & time 10/15/15  2141 History  By signing my name below, I, Rowan Blase, attest that this documentation has been prepared under the direction and in the presence of non-physician practitioner, Antonietta Breach, PA-C. Electronically Signed: Rowan Blase, Scribe. 10/15/2015. 10:38 PM.    Chief Complaint  Patient presents with  . Psychiatric Evaluation    The history is provided by the patient. No language interpreter was used.   HPI Comments:  Vanessa Alvarez is a 30 y.o. female with PMHx of mood disorder, mental retardation, anxiety, depression and end stage renal disease who presents to the Emergency Department for medical clearance and IVC. Pt states she is upset because she has kidney disease; she started dialysis in November. Pt did not go to dialysis yesterday because she is scared they are "being mean to me" and treating her badly. She states no one has treated her badly but she is worried they will. Per IVC paperwork, pt was at dialysis, became violent and pulled the equipment out of her arm. Pt's mother was concerned pt is a danger to herself. Pt is seen at [facility] every 3 months per pt's mother. Pt notes she still makes urine.   Past Medical History  Diagnosis Date  . Hyperlipidemia   . Behavioral problems   . Hypertension   . Autism spectrum   . Mood disorder [facility])     Sees Dr Silvio Pate, who prescribes meds  . Anemia     Iron Def  . 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 HYPOTHYROIDISM 03/12/2009    Qualifier: Diagnosis of  By: Earley Favor MD, Cat    . GERD (gastroesophageal reflux disease)   . Shortness of breath dyspnea     with exertion  . Anxiety   . Depression   . End stage renal disease [facility])    Past Surgical History  Procedure Laterality Date  . Cholecystectomy  11/2004  . Av fistula placement Left 03/17/2015    Procedure: CREATION OF LEFT BRACHIOCEPHALIC ARTERIOVENOUS (AV) FISTULA ;  Surgeon:  Conrad Brookneal, MD;  Location: [place];  Service: Vascular;  Laterality: Left;  . Insertion of dialysis catheter Right 03/17/2015    Procedure: INSERTION OF DIALYSIS CATHETER RIGHT INTERNAL JUIGULAR PLACEMENT;  Surgeon: Conrad Des Arc, MD;  Location: [place];  Service: Vascular;  Laterality: Right;   Family History  Problem Relation Age of Onset  . Hypertension Mother   . Diabetes Maternal Grandmother   . Hypertension Maternal Grandmother   . Heart disease Maternal Grandmother   . Bladder Cancer Maternal Grandmother   . Diabetes Maternal Grandfather    Social History  Substance Use Topics  . Smoking status: Never Smoker   . Smokeless tobacco: Never Used  . Alcohol Use: No   OB History    Gravida Para Term Preterm AB TAB SAB Ectopic Multiple Living   0 0 0 0 0 0 0 0        Review of Systems  Skin: Negative for wound.  Psychiatric/Behavioral: The patient is nervous/anxious.   All other systems reviewed and are negative.   Allergies  Review of patient's allergies indicates no known allergies.  Home Medications   Prior to Admission medications   Medication Sig Start Date End Date Taking? Authorizing Provider  ARIPiprazole (ABILIFY) 5 MG tablet Take 5 mg by mouth at bedtime.   Yes Historical Provider, MD  B Complex-C-Folic  Acid (DIALYVITE 800 PO) Take by mouth.   Yes Historical Provider, MD  cinacalcet (SENSIPAR) 30 MG tablet Take 30 mg by mouth daily with supper.   Yes Historical Provider, MD  ferrous sulfate 325 (65 FE) MG tablet Take 1 tablet (325 mg total) by mouth 2 (two) times daily. 01/07/12  Yes Clovis Cao, MD  hydrOXYzine (ATARAX/VISTARIL) 25 MG tablet Take 1 tablet (25 mg total) by mouth every 6 (six) hours as needed for anxiety, itching or nausea. 07/17/15  Yes Gareth Morgan, MD  lamoTRIgine (LAMICTAL) 150 MG tablet Take 150 mg by mouth at bedtime. 09/18/15  Yes Historical Provider, MD  levothyroxine (SYNTHROID, LEVOTHROID) 112 MCG tablet TAKE 1 TABLET DAILY BEFORE  BREAKFAST. 07/14/15  Yes Ashly M Gottschalk, DO  omeprazole (PRILOSEC) 20 MG capsule TAKE 1 CAPSULE (20 MG TOTAL) BY MOUTH DAILY. 10/06/15  Yes Ashly Windell Moulding, DO  polyethylene glycol powder (GLYCOLAX/MIRALAX) powder Take 17 g by mouth daily. Reported on 05/26/2015 03/03/15  Yes Historical Provider, MD  pravastatin (PRAVACHOL) 20 MG tablet Take 20 mg by mouth at bedtime.   Yes Historical Provider, MD  sertraline (ZOLOFT) 100 MG tablet Take 200 mg by mouth daily.    Yes Historical Provider, MD  sevelamer carbonate (RENVELA) 800 MG tablet Take 1,600 mg by mouth 3 (three) times daily with meals.   Yes Historical Provider, MD  SUMAtriptan (IMITREX) 25 MG tablet Take 1 tablet (25 mg total) by mouth every 2 (two) hours as needed for migraine. May repeat in 2 hours ONCE if headache persists or recurs. 10/06/15  Yes Ashly Windell Moulding, DO  ranitidine (ZANTAC) 150 MG tablet Take 1 tablet (150 mg total) by mouth daily. Patient not taking: Reported on 10/15/2015 09/16/15   [name], DO  triamcinolone ointment (KENALOG) 0.5 % Apply 1 application topically 2 (two) times daily. Patient not taking: Reported on 09/15/2015 08/18/15   Patrecia Pour, MD   BP 126/76 mmHg  Pulse 98  Temp(Src) 97.9 F (36.6 C) (Oral)  Resp 18  Ht 5\' 4"  (1.626 m)  Wt 78.926 kg  BMI 29.85 kg/m2  SpO2 97%  LMP 10/03/2015 (Approximate)   Physical Exam  Constitutional: She is oriented to person, place, and time. She appears well-developed and well-nourished. No distress.  HENT:  Head: Normocephalic and atraumatic.  Eyes: Conjunctivae and EOM are normal. No scleral icterus.  Neck: Normal range of motion.  Pulmonary/Chest: Effort normal. No respiratory distress.  Respirations even and unlabored  Musculoskeletal: Normal range of motion.  Neurological: She is alert and oriented to person, place, and time.  Skin: Skin is warm and dry. No rash noted. She is not diaphoretic. No erythema. No pallor.  Psychiatric: Her speech is  normal. Her mood appears anxious. Thought content is paranoid.  Nursing note and vitals reviewed.   ED Course  Procedures  DIAGNOSTIC STUDIES:  Oxygen Saturation is 98% on RA, normal by my interpretation.    COORDINATION OF CARE:  10:30 PM Will order labs and consult. Discussed treatment plan with pt at bedside and pt agreed to plan.  Labs Review Labs Reviewed  COMPREHENSIVE METABOLIC PANEL - Abnormal; Notable for the following:    BUN 43 (*)    Creatinine, Ser 10.39 (*)    Total Protein 8.2 (*)    AST 11 (*)    ALT 10 (*)    GFR calc non Af Amer 4 (*)    GFR calc Af Amer 5 (*)    All other  components within normal limits  ACETAMINOPHEN LEVEL - Abnormal; Notable for the following:    Acetaminophen (Tylenol), Serum <10 (*)    All other components within normal limits  CBC - Abnormal; Notable for the following:    Hemoglobin 10.5 (*)    HCT 31.3 (*)    All other components within normal limits  ETHANOL  SALICYLATE LEVEL  URINE RAPID DRUG SCREEN, HOSP PERFORMED    Imaging Review No results found.   I have personally reviewed and evaluated these images and lab results as part of my medical decision-making.   EKG Interpretation None      MDM   Final diagnoses:  Paranoid behavior ([place])  Dialysis patient, noncompliant ([place])  Involuntary commitment    30 year old female presents to the emergency department for psychiatric evaluation. She is under IVC taken out by family over concern for paranoia. Patient with a history of mental retardation, anxiety, and depression. The patient has been followed by [facility] in the past for psychiatric medications. She was recently started on dialysis, but has been noncompliant with attending her dialysis sessions as she is paranoid that the nurses are trying to hurt her and that they are being mean to her.  Unfortunately, after 8 hours in the emergency department the patient has yet to receive psychiatric evaluation and recommendations  on her paranoia. The patient did miss her dialysis session 2 days ago. She is supposed to be dialyzed today. I am concerned that keeping the patient in the emergency department awaiting TTS consult will result in her missing dialysis, again, today. I have discussed admission with Dr. Tamala Julian as well as continued psychiatric evaluation while inpatient. Plan to proceed with this to prevent worsening effects of dialysis noncompliance.  Psychiatric evaluation will be necessary given that the patient is under IVC. She will be transferred to [facility] for further dialysis management and psychiatric care.  I personally performed the services described in this documentation, which was scribed in my presence. The recorded information has been reviewed and is accurate.      Antonietta Breach, PA-C 10/16/15 [id number]  Merrily Pew, MD 10/20/15 [phone]

## 2015-10-16 ENCOUNTER — Inpatient Hospital Stay (HOSPITAL_COMMUNITY): Payer: Medicare Other

## 2015-10-16 ENCOUNTER — Encounter (HOSPITAL_COMMUNITY): Payer: Self-pay | Admitting: General Practice

## 2015-10-16 DIAGNOSIS — F39 Unspecified mood [affective] disorder: Secondary | ICD-10-CM | POA: Diagnosis present

## 2015-10-16 DIAGNOSIS — I120 Hypertensive chronic kidney disease with stage 5 chronic kidney disease or end stage renal disease: Secondary | ICD-10-CM | POA: Diagnosis present

## 2015-10-16 DIAGNOSIS — K219 Gastro-esophageal reflux disease without esophagitis: Secondary | ICD-10-CM | POA: Diagnosis present

## 2015-10-16 DIAGNOSIS — D638 Anemia in other chronic diseases classified elsewhere: Secondary | ICD-10-CM | POA: Diagnosis present

## 2015-10-16 DIAGNOSIS — E785 Hyperlipidemia, unspecified: Secondary | ICD-10-CM | POA: Diagnosis present

## 2015-10-16 DIAGNOSIS — N2581 Secondary hyperparathyroidism of renal origin: Secondary | ICD-10-CM | POA: Diagnosis present

## 2015-10-16 DIAGNOSIS — F22 Delusional disorders: Secondary | ICD-10-CM

## 2015-10-16 DIAGNOSIS — R0602 Shortness of breath: Secondary | ICD-10-CM

## 2015-10-16 DIAGNOSIS — N186 End stage renal disease: Secondary | ICD-10-CM | POA: Diagnosis present

## 2015-10-16 DIAGNOSIS — F79 Unspecified intellectual disabilities: Secondary | ICD-10-CM | POA: Diagnosis present

## 2015-10-16 DIAGNOSIS — F419 Anxiety disorder, unspecified: Secondary | ICD-10-CM | POA: Diagnosis present

## 2015-10-16 DIAGNOSIS — E039 Hypothyroidism, unspecified: Secondary | ICD-10-CM | POA: Diagnosis present

## 2015-10-16 DIAGNOSIS — D631 Anemia in chronic kidney disease: Secondary | ICD-10-CM | POA: Diagnosis not present

## 2015-10-16 DIAGNOSIS — F329 Major depressive disorder, single episode, unspecified: Secondary | ICD-10-CM | POA: Diagnosis present

## 2015-10-16 DIAGNOSIS — Z9119 Patient's noncompliance with other medical treatment and regimen: Secondary | ICD-10-CM | POA: Diagnosis not present

## 2015-10-16 DIAGNOSIS — Z9115 Patient's noncompliance with renal dialysis: Secondary | ICD-10-CM | POA: Diagnosis not present

## 2015-10-16 DIAGNOSIS — F840 Autistic disorder: Secondary | ICD-10-CM | POA: Diagnosis present

## 2015-10-16 DIAGNOSIS — Z992 Dependence on renal dialysis: Secondary | ICD-10-CM | POA: Diagnosis not present

## 2015-10-16 DIAGNOSIS — Z046 Encounter for general psychiatric examination, requested by authority: Secondary | ICD-10-CM

## 2015-10-16 DIAGNOSIS — E8889 Other specified metabolic disorders: Secondary | ICD-10-CM | POA: Diagnosis present

## 2015-10-16 LAB — RAPID URINE DRUG SCREEN, HOSP PERFORMED
AMPHETAMINES: NOT DETECTED
BARBITURATES: NOT DETECTED
BENZODIAZEPINES: NOT DETECTED
Cocaine: NOT DETECTED
Opiates: NOT DETECTED
Tetrahydrocannabinol: NOT DETECTED

## 2015-10-16 LAB — MRSA PCR SCREENING: MRSA by PCR: NEGATIVE

## 2015-10-16 MED ORDER — HYDROXYZINE HCL 25 MG PO TABS: 25.0000 mg | ORAL_TABLET | Freq: Four times a day (QID) | ORAL | Status: DC | PRN

## 2015-10-16 MED ORDER — DOXERCALCIFEROL 4 MCG/2ML IV SOLN
INTRAVENOUS | Status: AC
  Filled 2015-10-16: qty 2

## 2015-10-16 MED ORDER — SERTRALINE HCL 100 MG PO TABS
200.0000 mg | ORAL_TABLET | Freq: Every day | ORAL | Status: DC
  Administered 2015-10-16 – 2015-10-17 (×2): 200 mg via ORAL
  Filled 2015-10-16 (×2): qty 2

## 2015-10-16 MED ORDER — RENA-VITE PO TABS
1.0000 | ORAL_TABLET | Freq: Every day | ORAL | Status: DC
  Administered 2015-10-16 – 2015-10-17 (×2): 1 via ORAL
  Filled 2015-10-16 (×2): qty 1

## 2015-10-16 MED ORDER — FERROUS SULFATE 325 (65 FE) MG PO TABS: 325.0000 mg | ORAL_TABLET | Freq: Two times a day (BID) | ORAL | Status: DC

## 2015-10-16 MED ORDER — SEVELAMER CARBONATE 800 MG PO TABS
2400.0000 mg | ORAL_TABLET | Freq: Three times a day (TID) | ORAL | Status: DC
  Administered 2015-10-16 – 2015-10-17 (×3): 2400 mg via ORAL
  Filled 2015-10-16 (×4): qty 3

## 2015-10-16 MED ORDER — DOXERCALCIFEROL 4 MCG/2ML IV SOLN
3.0000 ug | INTRAVENOUS | Status: DC
  Administered 2015-10-16: 3 ug via INTRAVENOUS
  Filled 2015-10-16: qty 2

## 2015-10-16 MED ORDER — ALBUTEROL SULFATE (2.5 MG/3ML) 0.083% IN NEBU: 2.5000 mg | INHALATION_SOLUTION | RESPIRATORY_TRACT | Status: DC | PRN

## 2015-10-16 MED ORDER — CINACALCET HCL 30 MG PO TABS
30.0000 mg | ORAL_TABLET | Freq: Every day | ORAL | Status: DC
  Administered 2015-10-16 – 2015-10-17 (×2): 30 mg via ORAL
  Filled 2015-10-16 (×2): qty 1

## 2015-10-16 MED ORDER — PENTAFLUOROPROP-TETRAFLUOROETH EX AERO
INHALATION_SPRAY | CUTANEOUS | Status: AC
Start: 2015-10-16 — End: 2015-10-16
  Filled 2015-10-16: qty 103.5

## 2015-10-16 MED ORDER — SEVELAMER CARBONATE 800 MG PO TABS
1600.0000 mg | ORAL_TABLET | Freq: Three times a day (TID) | ORAL | Status: DC
  Administered 2015-10-16: 1600 mg via ORAL
  Filled 2015-10-16: qty 2

## 2015-10-16 MED ORDER — LEVOTHYROXINE SODIUM 112 MCG PO TABS
112.0000 ug | ORAL_TABLET | Freq: Every day | ORAL | Status: DC
  Administered 2015-10-16 – 2015-10-17 (×2): 112 ug via ORAL
  Filled 2015-10-16 (×2): qty 1

## 2015-10-16 MED ORDER — ACETAMINOPHEN 325 MG PO TABS: 650.0000 mg | ORAL_TABLET | Freq: Four times a day (QID) | ORAL | Status: DC | PRN

## 2015-10-16 MED ORDER — DARBEPOETIN ALFA 25 MCG/0.42ML IJ SOSY
25.0000 ug | PREFILLED_SYRINGE | Freq: Once | INTRAMUSCULAR | Status: AC
  Administered 2015-10-16: 25 ug via INTRAVENOUS

## 2015-10-16 MED ORDER — ACETAMINOPHEN 650 MG RE SUPP: 650.0000 mg | Freq: Four times a day (QID) | RECTAL | Status: DC | PRN

## 2015-10-16 MED ORDER — PANTOPRAZOLE SODIUM 40 MG PO TBEC
40.0000 mg | DELAYED_RELEASE_TABLET | Freq: Every day | ORAL | Status: DC
Start: 2015-10-16 — End: 2015-10-17
  Administered 2015-10-16 – 2015-10-17 (×2): 40 mg via ORAL
  Filled 2015-10-16 (×2): qty 1

## 2015-10-16 MED ORDER — POLYETHYLENE GLYCOL 3350 17 G PO PACK
17.0000 g | PACK | Freq: Every day | ORAL | Status: DC
  Administered 2015-10-17: 17 g via ORAL
  Filled 2015-10-16: qty 1

## 2015-10-16 MED ORDER — ARIPIPRAZOLE 5 MG PO TABS
5.0000 mg | ORAL_TABLET | Freq: Every day | ORAL | Status: DC
  Administered 2015-10-16: 5 mg via ORAL
  Filled 2015-10-16: qty 1

## 2015-10-16 MED ORDER — PRAVASTATIN SODIUM 20 MG PO TABS
20.0000 mg | ORAL_TABLET | Freq: Every day | ORAL | Status: DC
  Administered 2015-10-16: 20 mg via ORAL
  Filled 2015-10-16: qty 1

## 2015-10-16 MED ORDER — HEPARIN SODIUM (PORCINE) 5000 UNIT/ML IJ SOLN
5000.0000 [IU] | Freq: Three times a day (TID) | INTRAMUSCULAR | Status: DC
  Administered 2015-10-17: 5000 [IU] via SUBCUTANEOUS
  Filled 2015-10-16 (×3): qty 1

## 2015-10-16 MED ORDER — SUMATRIPTAN SUCCINATE 25 MG PO TABS
25.0000 mg | ORAL_TABLET | Freq: Two times a day (BID) | ORAL | Status: DC | PRN
  Filled 2015-10-16: qty 1

## 2015-10-16 MED ORDER — ONDANSETRON HCL 4 MG/2ML IJ SOLN
4.0000 mg | Freq: Four times a day (QID) | INTRAMUSCULAR | Status: DC | PRN
  Administered 2015-10-16: 4 mg via INTRAVENOUS
  Filled 2015-10-16: qty 2

## 2015-10-16 MED ORDER — LAMOTRIGINE 150 MG PO TABS
150.0000 mg | ORAL_TABLET | Freq: Every day | ORAL | Status: DC
  Administered 2015-10-16: 150 mg via ORAL
  Filled 2015-10-16 (×2): qty 1

## 2015-10-16 MED ORDER — ONDANSETRON HCL 4 MG PO TABS: 4.0000 mg | ORAL_TABLET | Freq: Four times a day (QID) | ORAL | Status: DC | PRN

## 2015-10-16 MED ORDER — DARBEPOETIN ALFA 25 MCG/0.42ML IJ SOSY
PREFILLED_SYRINGE | INTRAMUSCULAR | Status: AC
Start: 2015-10-16 — End: 2015-10-16
  Filled 2015-10-16: qty 0.42

## 2015-10-16 MED ORDER — ONDANSETRON 8 MG PO TBDP
8.0000 mg | ORAL_TABLET | Freq: Once | ORAL | Status: AC
  Administered 2015-10-16: 8 mg via ORAL
  Filled 2015-10-16: qty 1

## 2015-10-16 NOTE — Progress Notes (Signed)
   10/16/15 1411  Clinical Encounter Type  Visited With Patient  Visit Type Initial  Referral From Nurse  Consult/Referral To Chaplain  Spiritual Encounters  Spiritual Needs Emotional  Stress Factors  Patient Stress Factors Exhausted;Health changes;Major life changes  Chaplain visit made per nurse request, patient crying, feeling upset because of unfamiliar environment mother currently at work, provided emotional support, ministry of presence and prayer. Patient calmer by the end of visit, CNA sitting with patient.

## 2015-10-16 NOTE — Care Management Obs Status (Signed)
[place] NOTIFICATION   Patient Details  Name: Vanessa Alvarez MRN: [id number] Date of Birth: 1985-12-04   Medicare Observation Status Notification Given:  Yes  Code 44 given and explained to pt mother, [name].   Rohit Deloria, Rory Percy, RN 10/16/2015, 3:27 PM

## 2015-10-16 NOTE — Progress Notes (Signed)
Transfer note:from [place] [facility] Sitter assigned  To room Arrival Method: stretcher Mental Orientation:A&Ox4 Telemetry:none Assessment: see doc flowsheet Skin: Dry, intact Patient in purple scrubs Pain: Denies pain Tubes:none Fall Prevention Safety Plan: Reviewed with pt [id number] Orientation: Patient has been oriented to the unit, staff and to the room.

## 2015-10-16 NOTE — Consult Note (Signed)
[facility] Renal Consultation Note    Indication for Consultation:  Management of ESRD/hemodialysis; anemia, hypertension/volume and secondary hyperparathyroidism  HPI: Vanessa Alvarez is a 30 y.o. female with ESRD 2/2 FSGS who started hemodialysis 03/18/2015. She attends [facility]. PMH significant for mood disorder, autism spectrum, mental retardation, hypertension, hypothyroidism, GERD, anxiety, depression, anemia of chronic disease, secondary hyperparathyroidism. She was brought by her to ED for medical clearance for involuntary commitment for D/T not wanting to go to hemodialysis. Patient reports that people are mean to her and that she doesn't want to go there. Per H & P, Mother refutes these claims, says patient is paranoid and has had behavioral issues (pulling out needles) at HD center, that everyone has been nice to her. She has been admitted per hospitalist for IVC and hemodialysis. Admission labs unremarkable, urine drug screen negative. Patient C/O SOB in ED, CXR showed mild pulmonary venous congestion, no pulmonary edema.   Patient is awake, alert with safety sitter at bedside. She says, "I have been depressed about kidney disease and I just don't want to go to my treatments". C/O being anxious and depressed. Patient denies thoughts of harming herself, denies C/O pain, SOB, headache, N,V,D, abdominal pain, fever, chills, malaise. She appears exactly as I have seen her in the HD centers, which is quiet and cooperative. She missed HD 10/14/2015. "I just didn't want to go". Agrees to go to HD now. Mother is not present with patient-will need to speak to her for rest of history.   Past Medical History  Diagnosis Date  . Hyperlipidemia   . Behavioral problems   . Hypertension   . Autism spectrum   . Mood disorder [facility])     Sees Dr Silvio Pate, who prescribes meds  . Anemia     Iron Def  . MENTAL RETARDATION 06/23/2006    Qualifier: Diagnosis of  By:  Eusebio Friendly    . HYPOTHYROIDISM 03/12/2009    Qualifier: Diagnosis of  By: Earley Favor MD, Cat    . GERD (gastroesophageal reflux disease)   . Shortness of breath dyspnea     with exertion  . Anxiety   . Depression   . End stage renal disease [facility])    Past Surgical History  Procedure Laterality Date  . Cholecystectomy  11/2004  . Av fistula placement Left 03/17/2015    Procedure: CREATION OF LEFT BRACHIOCEPHALIC ARTERIOVENOUS (AV) FISTULA ;  Surgeon: Conrad [place], MD;  Location: [place];  Service: Vascular;  Laterality: Left;  . Insertion of dialysis catheter Right 03/17/2015    Procedure: INSERTION OF DIALYSIS CATHETER RIGHT INTERNAL JUIGULAR PLACEMENT;  Surgeon: Conrad Kaylor, MD;  Location: [place];  Service: Vascular;  Laterality: Right;   Family History  Problem Relation Age of Onset  . Hypertension Mother   . Diabetes Maternal Grandmother   . Hypertension Maternal Grandmother   . Heart disease Maternal Grandmother   . Bladder Cancer Maternal Grandmother   . Diabetes Maternal Grandfather    Social History:  reports that she has never smoked. She has never used smokeless tobacco. She reports that she does not drink alcohol or use illicit drugs. No Known Allergies    Prior to Admission medications   Medication Sig Start Date End Date Taking? Authorizing Provider  ARIPiprazole (ABILIFY) 5 MG tablet Take 5 mg by mouth at bedtime.   Yes Historical Provider, MD  B Complex-C-Folic Acid (DIALYVITE Q000111Q PO) Take by mouth.   Yes Historical Provider,  MD  cinacalcet (SENSIPAR) 30 MG tablet Take 30 mg by mouth daily with supper.   Yes Historical Provider, MD  ferrous sulfate 325 (65 FE) MG tablet Take 1 tablet (325 mg total) by mouth 2 (two) times daily. 01/07/12  Yes Clovis Cao, MD  hydrOXYzine (ATARAX/VISTARIL) 25 MG tablet Take 1 tablet (25 mg total) by mouth every 6 (six) hours as needed for anxiety, itching or nausea. 07/17/15  Yes Gareth Morgan, MD  lamoTRIgine (LAMICTAL) 150 MG tablet  Take 150 mg by mouth at bedtime. 09/18/15  Yes Historical Provider, MD  levothyroxine (SYNTHROID, LEVOTHROID) 112 MCG tablet TAKE 1 TABLET DAILY BEFORE BREAKFAST. 07/14/15  Yes Ashly M Gottschalk, DO  omeprazole (PRILOSEC) 20 MG capsule TAKE 1 CAPSULE (20 MG TOTAL) BY MOUTH DAILY. 10/06/15  Yes Ashly Windell Moulding, DO  polyethylene glycol powder (GLYCOLAX/MIRALAX) powder Take 17 g by mouth daily. Reported on 05/26/2015 03/03/15  Yes Historical Provider, MD  pravastatin (PRAVACHOL) 20 MG tablet Take 20 mg by mouth at bedtime.   Yes Historical Provider, MD  sertraline (ZOLOFT) 100 MG tablet Take 200 mg by mouth daily.    Yes Historical Provider, MD  sevelamer carbonate (RENVELA) 800 MG tablet Take 1,600 mg by mouth 3 (three) times daily with meals.   Yes Historical Provider, MD  SUMAtriptan (IMITREX) 25 MG tablet Take 1 tablet (25 mg total) by mouth every 2 (two) hours as needed for migraine. May repeat in 2 hours ONCE if headache persists or recurs. 10/06/15  Yes Ashly Windell Moulding, DO  ranitidine (ZANTAC) 150 MG tablet Take 1 tablet (150 mg total) by mouth daily. Patient not taking: Reported on 10/15/2015 09/16/15   [name], DO  triamcinolone ointment (KENALOG) 0.5 % Apply 1 application topically 2 (two) times daily. Patient not taking: Reported on 09/15/2015 08/18/15   Patrecia Pour, MD   Current Facility-Administered Medications  Medication Dose Route Frequency Provider Last Rate Last Dose  . acetaminophen (TYLENOL) tablet 650 mg  650 mg Oral Q6H PRN Norval Morton, MD       Or  . acetaminophen (TYLENOL) suppository 650 mg  650 mg Rectal Q6H PRN Rondell A Tamala Julian, MD      . albuterol (PROVENTIL) (2.5 MG/3ML) 0.083% nebulizer solution 2.5 mg  2.5 mg Nebulization Q2H PRN Norval Morton, MD      . ARIPiprazole (ABILIFY) tablet 5 mg  5 mg Oral QHS Rondell A Tamala Julian, MD      . cinacalcet (SENSIPAR) tablet 30 mg  30 mg Oral Q supper Rondell A Tamala Julian, MD      . ferrous sulfate tablet 325 mg  325 mg Oral  BID Norval Morton, MD      . heparin injection 5,000 Units  5,000 Units Subcutaneous Q8H Norval Morton, MD   5,000 Units at 10/16/15 0654  . hydrOXYzine (ATARAX/VISTARIL) tablet 25 mg  25 mg Oral Q6H PRN Norval Morton, MD      . lamoTRIgine (LAMICTAL) tablet 150 mg  150 mg Oral QHS Rondell A Smith, MD      . levothyroxine (SYNTHROID, LEVOTHROID) tablet 112 mcg  112 mcg Oral QAC breakfast Rondell A Tamala Julian, MD      . multivitamin (RENA-VIT) tablet 1 tablet  1 tablet Oral Daily Rondell A Tamala Julian, MD      . ondansetron (ZOFRAN) tablet 4 mg  4 mg Oral Q6H PRN Norval Morton, MD       Or  . ondansetron (ZOFRAN) injection  4 mg  4 mg Intravenous Q6H PRN Norval Morton, MD      . pantoprazole (PROTONIX) EC tablet 40 mg  40 mg Oral Daily Rondell Charmayne Sheer, MD      . pentafluoroprop-tetrafluoroeth (GEBAUERS) aerosol           . polyethylene glycol (MIRALAX / GLYCOLAX) packet 17 g  17 g Oral Daily Rondell A Tamala Julian, MD      . pravastatin (PRAVACHOL) tablet 20 mg  20 mg Oral QHS Rondell A Tamala Julian, MD      . sertraline (ZOLOFT) tablet 200 mg  200 mg Oral Daily Rondell A Tamala Julian, MD      . sevelamer carbonate (RENVELA) tablet 1,600 mg  1,600 mg Oral TID WC Norval Morton, MD   1,600 mg at 10/16/15 0843  . SUMAtriptan (IMITREX) tablet 25 mg  25 mg Oral BID PRN Norval Morton, MD       Labs:   Recent Labs Lab 10/15/15 2217  NA 138  K 4.0  CL 104  CO2 22  GLUCOSE 97  BUN 43*  CREATININE 10.39*  CALCIUM 8.9     Recent Labs Lab 10/15/15 2217  AST 11*  ALT 10*  ALKPHOS 69  BILITOT 0.5  PROT 8.2*  ALBUMIN 4.3     Recent Labs Lab 10/15/15 2217  WBC 8.9  HGB 10.5*  HCT 31.3*  MCV 80.7  PLT 206   Dg Chest Port 1 View  10/16/2015  CLINICAL DATA:  Initial evaluation for acute shortness of breath. EXAM: PORTABLE CHEST 1 VIEW COMPARISON:  Prior radiograph from 07/17/2004. FINDINGS: Cardiomegaly. Mediastinal silhouette within normal limits. Mild tortuosity the intrathoracic aorta noted.  Lungs normally inflated. No focal infiltrates. Mild venous congestion without overt pulmonary edema. No pleural effusion. No pneumothorax. No acute osseous abnormality.  Scoliosis noted. IMPRESSION: 1. Cardiomegaly with mild pulmonary venous congestion without overt pulmonary edema. 2. No other active cardiopulmonary disease. Electronically Signed   By: Jeannine Boga M.D.   On: 10/16/2015 06:26    ROS: As per HPI otherwise negative.   Physical Exam: Filed Vitals:   10/16/15 0530 10/16/15 0553 10/16/15 0600 10/16/15 0641  BP: 132/81  126/76 132/89  Pulse: 102  98 96  Temp:    98.6 F (37 C)  TempSrc:    Oral  Resp:    18  Height:  5\' 4"  (1.626 m)    Weight:  78.926 kg (174 lb)  80.151 kg (176 lb 11.2 oz)  SpO2: 98%  97% 99%     General: Well developed, well nourished, in no acute distress. Head: Normocephalic, atraumatic, sclera non-icteric, mucus membranes are moist. Eyelids are puffy, periorbital edema present.  Neck: Supple. JVD not elevated. Lungs: BBS few bibasilar crackles, few scatters coarse breath sounds upper lungs fields. No wheezing. No WOB.  Heart: RRR with S1 S2. No murmurs, rubs, or gallops appreciated. Abdomen: Soft, non-tender, non-distended with normoactive bowel sounds. No rebound/guarding. No obvious abdominal masses. M-S:  Strength and tone appear normal for age. Lower extremities:without edema or ischemic changes, no open wounds  Neuro: Alert and oriented X 3. Moves all extremities spontaneously. Skin: Has scarring on arms and legs from self inflicted scratches.  Psych:  Responds to questions appropriately with a normal affect. Dialysis Access: LUA AVF + bruit  Dialysis Orders: [facility] 3.5 hours EDW 78.5 kg 2.0 K/2.25 Ca bath 350/Auto 1.5 Linear Na Heparin 2800 units IV q treatment Mircera 50 mcg IV Q 4 weeks (last  dose 09/02/15 HGB 10.5 10/09/15) Hectoral 3 mcg IV q tx (last PTH 39105/30/17)   Assessment/Plan: 1.  Paranoia/ Mood disorder  history/Medical noncompliance-refusing HD: IVC. Agrees to go to HD today. Cont psych meds.  2.  ESRD -  [facility] for HD today on schedule. K+4.0 2.0 K bath.  3. Hypertension/volume  - Wt 80.2 kg. 2-2.5 liters today. No antihypertensive meds on OP med list 4.  Anemia  - HGB 10.5. Due to ESA dose in-center today. Will give Aranesp 25 mcg IV today. Follow HGB. Patient has oral Fe in orders-does not need this as this is assessed in HD. Not on OP med list in HD center.  5.  Metabolic bone disease - Cont VDRA/Binders/sensipar.  6.  Nutrition - Albumin 4.3. Renal Diet/Renal vits.  7. Hypothyroidism: per primary  Rita H. Owens Shark, NP-C 10/16/2015, 9:07 AM  [employer] [phone]  I have seen and examined this patient and agree with plan and assessment in the above note. Needs medical clearance for involuntary commitment. Agrees to have HD here today. Jamal Maes B,MD 10/16/2015 10:59 AM

## 2015-10-16 NOTE — Progress Notes (Signed)
Vanessa Alvarez is a 30 y.o. female with medical history significant of mood disorder, mental retardation/autism, HTN, ESRD on HD(T/Th/Sat), hypothyroidism; who presented to the emergency room for need of medical clearance and involuntary commitment due to not wanting to go to hemodialysis.    Seen and examined patient today.  She is alert and oriented, does not appear to be paranoid.  Agrees to go to HD treatments as scheduled.  CVS s1s2,  Lungs Clear , no wheezing or rhonchi.  abd soft non tender non distended.  Neuro: alert and oriented and calm   Plan: Complete HD session.  Resume psychiatry meds.  Watch patient overnight.    Hosie Poisson, MD [phone]

## 2015-10-16 NOTE — H&P (Signed)
History and Physical    Vanessa Alvarez [id number] DOB: 04/03/1986 DOA: 10/15/2015  Referring MD/NP/PA: Antonietta Breach PA-C PCP: Ronnie Doss, DO  Patient coming from:   Chief Complaint: "People being mean to me"  HPI: Vanessa Alvarez is a 30 y.o. female with medical history significant of mood disorder, mental retardation/autism, HTN, ESRD on HD(T/Th/Sat), hypothyroidism; who presented to the emergency room for need of medical clearance and involuntary commitment due to not wanting to go to hemodialysis. Patient's mother helps provide history as patient is unable to do so for herself due to history of mental retardation. Patient has been on hemodialysis since 03/18/2015. Patient is seen by "Dr. Loletha Grayer" at [facility]. She missed her Tuesday hemodialysis session. She denies any suicidal or homicidal ideations. The patient was seen at [facility] yesterday for which she is as noted has been refusing to go to hemodialysis due to people being mean to her. Her mother states that noone has been mean to her at the hemodialysis unit. Patient has paranoid and refuses to go and therefore was a concern of being a danger to herself. Per review of records it appears that the patient has been uncooperative dialysis regimen and previously removed  the dialysis needles from arm. Patient complains of associated symptoms of feeling nervous, shortness of breath, and knee pain and cramping. Patient also notes that she still is able to make a little bit of urine.  ED Course: Upon admission into the emergency department the patient was evaluated and seen to be afebrile, with heart rates up to 110, and all other vital signs within normal limits. Lab work revealed hemoglobin of 10.5, BUN 43, creatinine 10.39. Urine drug screen, alcohol, acetaminophen, and salicylate levels undetectable. Patient has been IVC. TRH consulted to admit for need of hemodialysis.  Review of Systems: As per HPI otherwise 10 point review of  systems negative.   Past Medical History  Diagnosis Date  . Hyperlipidemia   . Behavioral problems   . Hypertension   . Autism spectrum   . Mood disorder [facility])     Sees Dr Silvio Pate, who prescribes meds  . Anemia     Iron Def  . 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 HYPOTHYROIDISM 03/12/2009    Qualifier: Diagnosis of  By: Earley Favor MD, Cat    . GERD (gastroesophageal reflux disease)   . Shortness of breath dyspnea     with exertion  . Anxiety   . Depression   . End stage renal disease [facility])     Past Surgical History  Procedure Laterality Date  . Cholecystectomy  11/2004  . Av fistula placement Left 03/17/2015    Procedure: CREATION OF LEFT BRACHIOCEPHALIC ARTERIOVENOUS (AV) FISTULA ;  Surgeon: Conrad Heber, MD;  Location: [place];  Service: Vascular;  Laterality: Left;  . Insertion of dialysis catheter Right 03/17/2015    Procedure: INSERTION OF DIALYSIS CATHETER RIGHT INTERNAL JUIGULAR PLACEMENT;  Surgeon: Conrad [place], MD;  Location: [place];  Service: Vascular;  Laterality: Right;     reports that she has never smoked. She has never used smokeless tobacco. She reports that she does not drink alcohol or use illicit drugs.  No Known Allergies  Family History  Problem Relation Age of Onset  . Hypertension Mother   . Diabetes Maternal Grandmother   . Hypertension Maternal Grandmother   . Heart disease Maternal Grandmother   . Bladder Cancer Maternal Grandmother   .  Diabetes Maternal Grandfather     Prior to Admission medications   Medication Sig Start Date End Date Taking? Authorizing Provider  ARIPiprazole (ABILIFY) 5 MG tablet Take 5 mg by mouth at bedtime.   Yes Historical Provider, MD  B Complex-C-Folic Acid (DIALYVITE Q000111Q PO) Take by mouth.   Yes Historical Provider, MD  cinacalcet (SENSIPAR) 30 MG tablet Take 30 mg by mouth daily with supper.   Yes Historical Provider, MD  ferrous sulfate 325 (65 FE) MG tablet Take 1 tablet (325  mg total) by mouth 2 (two) times daily. 01/07/12  Yes Clovis Cao, MD  hydrOXYzine (ATARAX/VISTARIL) 25 MG tablet Take 1 tablet (25 mg total) by mouth every 6 (six) hours as needed for anxiety, itching or nausea. 07/17/15  Yes Gareth Morgan, MD  lamoTRIgine (LAMICTAL) 150 MG tablet Take 150 mg by mouth at bedtime. 09/18/15  Yes Historical Provider, MD  levothyroxine (SYNTHROID, LEVOTHROID) 112 MCG tablet TAKE 1 TABLET DAILY BEFORE BREAKFAST. 07/14/15  Yes Ashly M Gottschalk, DO  omeprazole (PRILOSEC) 20 MG capsule TAKE 1 CAPSULE (20 MG TOTAL) BY MOUTH DAILY. 10/06/15  Yes Ashly Windell Moulding, DO  polyethylene glycol powder (GLYCOLAX/MIRALAX) powder Take 17 g by mouth daily. Reported on 05/26/2015 03/03/15  Yes Historical Provider, MD  pravastatin (PRAVACHOL) 20 MG tablet Take 20 mg by mouth at bedtime.   Yes Historical Provider, MD  sertraline (ZOLOFT) 100 MG tablet Take 200 mg by mouth daily.    Yes Historical Provider, MD  sevelamer carbonate (RENVELA) 800 MG tablet Take 1,600 mg by mouth 3 (three) times daily with meals.   Yes Historical Provider, MD  SUMAtriptan (IMITREX) 25 MG tablet Take 1 tablet (25 mg total) by mouth every 2 (two) hours as needed for migraine. May repeat in 2 hours ONCE if headache persists or recurs. 10/06/15  Yes Ashly Windell Moulding, DO  ranitidine (ZANTAC) 150 MG tablet Take 1 tablet (150 mg total) by mouth daily. Patient not taking: Reported on 10/15/2015 09/16/15   [name], DO  triamcinolone ointment (KENALOG) 0.5 % Apply 1 application topically 2 (two) times daily. Patient not taking: Reported on 09/15/2015 08/18/15   Patrecia Pour, MD    Physical Exam: Filed Vitals:   10/15/15 2205 10/16/15 0213 10/16/15 0437  BP: 160/94 132/81 132/77  Pulse: 110 99 103  Temp: 98.2 F (36.8 C) 98.1 F (36.7 C) 97.9 F (36.6 C)  TempSrc: Oral Oral Oral  Resp: 20 18 18   SpO2: 98% 100% 99%      Constitutional: Obese female in NAD, calm, comfortable Filed Vitals:    10/15/15 2205 10/16/15 0213 10/16/15 0437  BP: 160/94 132/81 132/77  Pulse: 110 99 103  Temp: 98.2 F (36.8 C) 98.1 F (36.7 C) 97.9 F (36.6 C)  TempSrc: Oral Oral Oral  Resp: 20 18 18   SpO2: 98% 100% 99%   Eyes: PERRL, lids and conjunctivae normal ENMT: Mucous membranes are moist. Posterior pharynx clear of any exudate or lesions.Normal dentition.  Neck: normal, supple, no masses, no thyromegaly Respiratory: clear to auscultation bilaterally, no wheezing, no crackles. Normal respiratory effort. No accessory muscle use.  Cardiovascular: Regular rate and rhythm, no murmurs / rubs / gallops. No extremity edema. 2+ pedal pulses. No carotid bruits.  Abdomen: no tenderness, no masses palpated. No hepatosplenomegaly. Bowel sounds positive.  Musculoskeletal: no clubbing / cyanosis. No joint deformity upper and lower extremities. Good ROM, no contractures. Normal muscle tone.  Skin: no rashes, lesions, ulcers. No induration Neurologic: CN  2-12 grossly intact. Sensation intact, DTR normal. Strength 5/5 in all 4.  Psychiatric: Normal judgment and insight. Alert and oriented x Person and place, but not circumstance.. Normal mood.     Labs on Admission: I have personally reviewed following labs and imaging studies  CBC:  Recent Labs Lab 10/15/15 2217  WBC 8.9  HGB 10.5*  HCT 31.3*  MCV 80.7  PLT 99991111   Basic Metabolic Panel:  Recent Labs Lab 10/15/15 2217  NA 138  K 4.0  CL 104  CO2 22  GLUCOSE 97  BUN 43*  CREATININE 10.39*  CALCIUM 8.9   GFR: Estimated Creatinine Clearance: 8 mL/min (by C-G formula based on Cr of 10.39). Liver Function Tests:  Recent Labs Lab 10/15/15 2217  AST 11*  ALT 10*  ALKPHOS 69  BILITOT 0.5  PROT 8.2*  ALBUMIN 4.3   No results for input(s): LIPASE, AMYLASE in the last 168 hours. No results for input(s): AMMONIA in the last 168 hours. Coagulation Profile: No results for input(s): INR, PROTIME in the last 168 hours. Cardiac  Enzymes: No results for input(s): CKTOTAL, CKMB, CKMBINDEX, TROPONINI in the last 168 hours. BNP (last 3 results) No results for input(s): PROBNP in the last 8760 hours. HbA1C: No results for input(s): HGBA1C in the last 72 hours. CBG: No results for input(s): GLUCAP in the last 168 hours. Lipid Profile: No results for input(s): CHOL, HDL, LDLCALC, TRIG, CHOLHDL, LDLDIRECT in the last 72 hours. Thyroid Function Tests: No results for input(s): TSH, T4TOTAL, FREET4, T3FREE, THYROIDAB in the last 72 hours. Anemia Panel: No results for input(s): VITAMINB12, FOLATE, FERRITIN, TIBC, IRON, RETICCTPCT in the last 72 hours. Urine analysis:    Component Value Date/Time   COLORURINE YELLOW 03/04/2015 1436   APPEARANCEUR HAZY* 03/04/2015 1436   LABSPEC 1.035* 03/04/2015 1436   PHURINE 6.0 03/04/2015 1436   GLUCOSEU NEGATIVE 03/04/2015 1436   HGBUR MODERATE* 03/04/2015 1436   HGBUR negative 01/26/2010 0830   BILIRUBINUR NEGATIVE 03/04/2015 1436   BILIRUBINUR NEG 10/23/2014 1606   KETONESUR NEGATIVE 03/04/2015 1436   PROTEINUR >300* 03/04/2015 1436   PROTEINUR >=300 10/23/2014 1606   UROBILINOGEN 0.2 03/04/2015 1436   UROBILINOGEN 0.2 10/23/2014 1606   NITRITE NEGATIVE 03/04/2015 1436   NITRITE NEG 10/23/2014 1606   LEUKOCYTESUR NEGATIVE 03/04/2015 1436   Sepsis Labs: No results found for this or any previous visit (from the past 240 hour(s)).   Radiological Exams on Admission: No results found.  Assessment/Plan Paranoia/ Mood disorder history: Patient refusing to go to dialysis secondary to paranoia that people were being mean to her at the dialysis unit. Patient has been involuntary committed - Admit to a MedSurg bed at [place], Abilify, and sotalol - Sitter to bedside as patient is a flight risk - Consult TTS  ESRD on HD/ Dialysis patient, noncompliant ([place]) - Continue Sensipar and anemia - Left message on HD answer machine - Will need to call to make  sure patient is placed on the list  Dyspnea - Check portable x-ray - Albuterol nebs prn shortness of breath  hypothyroidism - Continue levothyroxine  Anemia of chronic disease: Stable - Continue to monitor - Continue ferrous sulfate   DVT prophylaxis:Heparin   Code Status: full Family Communication: Discussed plan with the patient's mother present at bedside  Disposition Plan: Possible discharge home once medically stable  Consults called: None  Admission status:Inpatient MedSurg   Norval Morton MD [facility] Pager [phone]  If 7PM-7AM,  please contact night-coverage [email or web address] Password [facility]  10/16/2015, 5:38 AM

## 2015-10-16 NOTE — Procedures (Signed)
I have personally attended this patient's dialysis session.   She is currently on HD Access cannulated 1'47" left in treatment K4 - change bath to 4K Smiling, and without complaint right now  Jamal Maes, MD [place] Pager 10/16/2015, 11:09 AM

## 2015-10-16 NOTE — ED Notes (Signed)
[facility] arrived and taking patient to bed at [facility].

## 2015-10-17 DIAGNOSIS — Z046 Encounter for general psychiatric examination, requested by authority: Secondary | ICD-10-CM | POA: Insufficient documentation

## 2015-10-17 DIAGNOSIS — R0602 Shortness of breath: Secondary | ICD-10-CM | POA: Insufficient documentation

## 2015-10-17 MED ORDER — SODIUM CHLORIDE 0.9 % IV BOLUS (SEPSIS)
250.0000 mL | Freq: Once | INTRAVENOUS | Status: AC
  Administered 2015-10-17: 250 mL via INTRAVENOUS

## 2015-10-17 NOTE — Progress Notes (Signed)
[facility] Rounding Note  Subjective:   No cos/ Sitter  in room /states HD yesterday okay   Agrees to continue with normal schedule  Objective Vital signs in last 24 hours: Filed Vitals:   10/16/15 1246 10/16/15 1341 10/16/15 1625 10/16/15 2158  BP: 134/91 137/96 131/84 125/74  Pulse: 124 116 111 102  Temp: 99 F (37.2 C) 99.3 F (37.4 C) 98.7 F (37.1 C) 99.6 F (37.6 C)  TempSrc: Oral Oral Oral Oral  Resp: 18 18 18 18   Height:      Weight: 76.8 kg (169 lb 5 oz)     SpO2: 99% 100%  99%   Weight change: 0.374 kg (13.2 oz)  Physical Exam: General:  NAD /Alert/ talkative   Lungs: CTA  bilat  Heart: Regular, tachy. S1S2 No S3.  Abdomen: Soft, non-tender, non-distended with normoactive bowel sounds.. Lower extremities:without edema Dialysis Access: LUA AVF + bruit  OP Dialysis Orders:  [facility] 3.5 hours  EDW 78.5 kg 2.0 K/2.25 Ca bath  350/Auto 1.5 Linear Na Heparin 2800 units IV q treatment Mircera 50 mcg IV Q 4 weeks (last dose 09/02/15 HGB 10.5 10/09/15) Hectoral 3 mcg IV q tx (last PTH 39105/30/17)  Problem/Plan: 1. Paranoia/ Mood disorder history/Medical noncompliance-refusing HD. Involuntary commitment.  Had hemo yesterday no issues and indicates agreeable to continue.I'm not sure what further plans admitting team has for her. ? Is psychiatry supposed to see? 2. ESRD - [facility]   3. Hypertension/volume - Wt  Post hd 76.8 yest ( lower than edw) and was tachy during the night - got NS bolus. BP OK. Limit UF with HD tomorrow.   4. Anemia - HGB 10.5. yest . Was due for  ESA dose in-center yest so was given  Aranesp 25 mcg on HD here . Follow HGB. Patient has oral Fe in orders-does not need this as this is assessed in HD. Stopped.  5. Metabolic bone disease - Cont VDRA/Binders/sensipar.  6. Nutrition - Albumin 4.3. Renal Diet/Renal vits.  7. Hypothyroidism: per primary   Ernest Haber, PA-C Vanessa Alvarez [phone] 10/17/2015,9:47 AM  LOS: 1  day   I have seen and examined this patient and agree with plan and assessment in the above note with highlighted addition. Had HD without incident and will be scheduled for HD tomorrow.    Vanessa Abend B,MD 10/17/2015 11:02 AM  Labs:   Recent Labs Lab 10/15/15 2217  NA 138  K 4.0  CL 104  CO2 22  GLUCOSE 97  BUN 43*  CREATININE 10.39*  CALCIUM 8.9     Recent Labs Lab 10/15/15 2217  AST 11*  ALT 10*  ALKPHOS 69  BILITOT 0.5  PROT 8.2*  ALBUMIN 4.3     Recent Labs Lab 10/15/15 2217  WBC 8.9  HGB 10.5*  HCT 31.3*  MCV 80.7  PLT 206    Studies/Results: Dg Chest Port 1 View  10/16/2015  CLINICAL DATA:  Initial evaluation for acute shortness of breath. EXAM: PORTABLE CHEST 1 VIEW COMPARISON:  Prior radiograph from 07/17/2004. FINDINGS: Cardiomegaly. Mediastinal silhouette within normal limits. Mild tortuosity the intrathoracic aorta noted. Lungs normally inflated. No focal infiltrates. Mild venous congestion without overt pulmonary edema. No pleural effusion. No pneumothorax. No acute osseous abnormality.  Scoliosis noted. IMPRESSION: 1. Cardiomegaly with mild pulmonary venous congestion without overt pulmonary edema. 2. No other active cardiopulmonary disease. Electronically Signed   By: Jeannine Boga M.D.   On: 10/16/2015 06:26   Medications:   .  ARIPiprazole  5 mg Oral QHS  . cinacalcet  30 mg Oral Q supper  . doxercalciferol  3 mcg Intravenous Q T,Th,Sa-HD  . heparin  5,000 Units Subcutaneous Q8H  . lamoTRIgine  150 mg Oral QHS  . levothyroxine  112 mcg Oral QAC breakfast  . multivitamin  1 tablet Oral Daily  . pantoprazole  40 mg Oral Daily  . polyethylene glycol  17 g Oral Daily  . pravastatin  20 mg Oral QHS  . sertraline  200 mg Oral Daily  . sevelamer carbonate  2,400 mg Oral TID WC

## 2015-10-17 NOTE — Progress Notes (Signed)
Notified covering MD of patient's elevated heart rate (110's). An order was given for 218ml NS bolus. Patient remained asymptomatic. Will continue to monitor for patient safety.

## 2015-10-17 NOTE — Care Management Note (Signed)
Case Management Note  Patient Details  Name: Vanessa Alvarez MRN: [id number] Date of Birth: 02-Feb-1986  Subjective/Objective:        CM following for progression and d/c planning.             Action/Plan: 10/17/2015 Met with pt , who states that she stays at home and that her mother takes her to hemdialysis , then her aunt picks her up.  Pt is very calm, states that she feels well and is ready to go return to hemodialysis. No HH or DME needs. Pt is ambulatory.   Expected Discharge Date:    10/18/2015              Expected Discharge Plan:  Home/Self Care  In-House Referral:  Clinical Social Work  Discharge planning Services  CM Consult  Post Acute Care Choice:  NA Choice offered to:  NA  DME Arranged:  N/A DME Agency:  NA  HH Arranged:  NA HH Agency:  NA  Status of Service:  Completed, signed off  If discussed at [employer] of Stay Meetings, dates discussed:    Additional Comments:  Adron Bene, RN 10/17/2015, 4:08 PM

## 2015-10-17 NOTE — Progress Notes (Signed)
10/17/2015 5:33 PM  Reviewed discharge paperwork with patient and her mother.  Reviewed medications, discharge instructions, diet, and follow-up instructions.  Stressed the importance of being compliant with hemodialysis sessions as well as her diet.  Both asked questions appropriately, and verbalized understanding.  IV and tele removed by primary RN.  Pt's mother gathered patient's belongings, NT discharged patient via wheelchair through ED entrance.  Princella Pellegrini

## 2015-10-17 NOTE — Plan of Care (Signed)
Problem: Health Behavior/Discharge Planning: Goal: Ability to manage health-related needs will improve Outcome: Completed/Met Date Met:  10/17/15 Will be discharged home with family.

## 2015-10-18 DIAGNOSIS — N186 End stage renal disease: Secondary | ICD-10-CM | POA: Diagnosis not present

## 2015-10-18 DIAGNOSIS — Z23 Encounter for immunization: Secondary | ICD-10-CM | POA: Diagnosis not present

## 2015-10-18 DIAGNOSIS — D631 Anemia in chronic kidney disease: Secondary | ICD-10-CM | POA: Diagnosis not present

## 2015-10-18 DIAGNOSIS — N2581 Secondary hyperparathyroidism of renal origin: Secondary | ICD-10-CM | POA: Diagnosis not present

## 2015-10-20 NOTE — Discharge Summary (Addendum)
Physician Discharge Summary  Vanessa Alvarez [id number] DOB: 05-Dec-1985 DOA: 10/15/2015  PCP: Ronnie Doss, DO  Admit date: 10/15/2015 Discharge date: 10/17/2015  Admitted From: [facility] Disposition:  Home   Recommendations for Outpatient Follow-up:  1. Follow up with PCP in 1-2 weeks 2. Please obtain BMP/CBC in one week     Discharge Condition:stable.  CODE STATUS:full code  Diet recommendation: renal diet.   Brief/Interim Summary: Vanessa Alvarez is a 30 y.o. female with medical history significant of mood disorder, mental retardation/autism, HTN, ESRD on HD(T/Th/Sat), hypothyroidism; who presented to the emergency room for need of medical clearance and involuntary commitment due to not wanting to go to hemodialysis.   Discharge Diagnoses:  Active Problems:   Dialysis patient, noncompliant ([facility])   Paranoia ([facility])   Involuntary commitment   Shortness of breath  ESRD on HD: Pt completed HD without any issues and she reported that she was frustrated with everything and refused HD earlier.  No new issues.  D/c home to her mother.   Discharge Instructions  Discharge Instructions    Discharge instructions    Complete by:  As directed   Follow up with PCP in one week post hospitalization visit.  please be compliant to HD sessions. Follow renal diet.            Medication List    STOP taking these medications        ranitidine 150 MG tablet  Commonly known as:  ZANTAC     triamcinolone ointment 0.5 %  Commonly known as:  KENALOG      TAKE these medications        ARIPiprazole 5 MG tablet  Commonly known as:  ABILIFY  Take 5 mg by mouth at bedtime.     cinacalcet 30 MG tablet  Commonly known as:  SENSIPAR  Take 30 mg by mouth daily with supper.     DIALYVITE 800 PO  Take by mouth.     ferrous sulfate 325 (65 FE) MG tablet  Take 1 tablet (325 mg total) by mouth 2 (two) times daily.     hydrOXYzine 25 MG tablet  Commonly known as:  ATARAX/VISTARIL   Take 1 tablet (25 mg total) by mouth every 6 (six) hours as needed for anxiety, itching or nausea.     lamoTRIgine 150 MG tablet  Commonly known as:  LAMICTAL  Take 150 mg by mouth at bedtime.     levothyroxine 112 MCG tablet  Commonly known as:  SYNTHROID, LEVOTHROID  TAKE 1 TABLET DAILY BEFORE BREAKFAST.     omeprazole 20 MG capsule  Commonly known as:  PRILOSEC  TAKE 1 CAPSULE (20 MG TOTAL) BY MOUTH DAILY.     polyethylene glycol powder powder  Commonly known as:  GLYCOLAX/MIRALAX  Take 17 g by mouth daily. Reported on 05/26/2015     pravastatin 20 MG tablet  Commonly known as:  PRAVACHOL  Take 20 mg by mouth at bedtime.     sertraline 100 MG tablet  Commonly known as:  ZOLOFT  Take 200 mg by mouth daily.     sevelamer carbonate 800 MG tablet  Commonly known as:  RENVELA  Take 1,600 mg by mouth 3 (three) times daily with meals.     SUMAtriptan 25 MG tablet  Commonly known as:  IMITREX  Take 1 tablet (25 mg total) by mouth every 2 (two) hours as needed for migraine. May repeat in 2 hours ONCE if headache persists or recurs.  No Known Allergies  Consultations:  nephrology   Procedures/Studies: Dg Chest Port 1 View  10/16/2015  CLINICAL DATA:  Initial evaluation for acute shortness of breath. EXAM: PORTABLE CHEST 1 VIEW COMPARISON:  Prior radiograph from 07/17/2004. FINDINGS: Cardiomegaly. Mediastinal silhouette within normal limits. Mild tortuosity the intrathoracic aorta noted. Lungs normally inflated. No focal infiltrates. Mild venous congestion without overt pulmonary edema. No pleural effusion. No pneumothorax. No acute osseous abnormality.  Scoliosis noted. IMPRESSION: 1. Cardiomegaly with mild pulmonary venous congestion without overt pulmonary edema. 2. No other active cardiopulmonary disease. Electronically Signed   By: Jeannine Boga M.D.   On: 10/16/2015 06:26       Subjective: No new complaints, cheerful.   Discharge Exam: Filed Vitals:    10/16/15 2158 10/17/15 0950  BP: 125/74 147/88  Pulse: 102 107  Temp: 99.6 F (37.6 C) 98.4 F (36.9 C)  Resp: 18 18   Filed Vitals:   10/16/15 1341 10/16/15 1625 10/16/15 2158 10/17/15 0950  BP: 137/96 131/84 125/74 147/88  Pulse: 116 111 102 107  Temp: 99.3 F (37.4 C) 98.7 F (37.1 C) 99.6 F (37.6 C) 98.4 F (36.9 C)  TempSrc: Oral Oral Oral Oral  Resp: 18 18 18 18   Height:      Weight:      SpO2: 100%  99% 96%    General: Pt is alert, awake, not in acute distress Cardiovascular: RRR, S1/S2 +, no rubs, no gallops Respiratory: CTA bilaterally, no wheezing, no rhonchi Abdominal: Soft, NT, ND, bowel sounds + Extremities: no edema, no cyanosis    The results of significant diagnostics from this hospitalization (including imaging, microbiology, ancillary and laboratory) are listed below for reference.     Microbiology: Recent Results (from the past 240 hour(s))  MRSA PCR Screening     Status: None   Collection Time: 10/16/15  9:16 AM  Result Value Ref Range Status   MRSA by PCR NEGATIVE NEGATIVE Final    Comment:        The GeneXpert MRSA Assay (FDA approved for NASAL specimens only), is one component of a comprehensive MRSA colonization surveillance program. It is not intended to diagnose MRSA infection nor to guide or monitor treatment for MRSA infections.      Labs: BNP (last 3 results) No results for input(s): BNP in the last 8760 hours. Basic Metabolic Panel:  Recent Labs Lab 10/15/15 2217  NA 138  K 4.0  CL 104  CO2 22  GLUCOSE 97  BUN 43*  CREATININE 10.39*  CALCIUM 8.9   Liver Function Tests:  Recent Labs Lab 10/15/15 2217  AST 11*  ALT 10*  ALKPHOS 69  BILITOT 0.5  PROT 8.2*  ALBUMIN 4.3   No results for input(s): LIPASE, AMYLASE in the last 168 hours. No results for input(s): AMMONIA in the last 168 hours. CBC:  Recent Labs Lab 10/15/15 2217  WBC 8.9  HGB 10.5*  HCT 31.3*  MCV 80.7  PLT 206   Cardiac  Enzymes: No results for input(s): CKTOTAL, CKMB, CKMBINDEX, TROPONINI in the last 168 hours. BNP: Invalid input(s): POCBNP CBG: No results for input(s): GLUCAP in the last 168 hours. D-Dimer No results for input(s): DDIMER in the last 72 hours. Hgb A1c No results for input(s): HGBA1C in the last 72 hours. Lipid Profile No results for input(s): CHOL, HDL, LDLCALC, TRIG, CHOLHDL, LDLDIRECT in the last 72 hours. Thyroid function studies No results for input(s): TSH, T4TOTAL, T3FREE, THYROIDAB in the last 72 hours.  Invalid  input(s): FREET3 Anemia work up No results for input(s): VITAMINB12, FOLATE, FERRITIN, TIBC, IRON, RETICCTPCT in the last 72 hours. Urinalysis    Component Value Date/Time   COLORURINE YELLOW 03/04/2015 1436   APPEARANCEUR HAZY* 03/04/2015 1436   LABSPEC 1.035* 03/04/2015 1436   PHURINE 6.0 03/04/2015 1436   GLUCOSEU NEGATIVE 03/04/2015 1436   HGBUR MODERATE* 03/04/2015 1436   HGBUR negative 01/26/2010 0830   BILIRUBINUR NEGATIVE 03/04/2015 1436   BILIRUBINUR NEG 10/23/2014 1606   KETONESUR NEGATIVE 03/04/2015 1436   PROTEINUR >300* 03/04/2015 1436   PROTEINUR >=300 10/23/2014 1606   UROBILINOGEN 0.2 03/04/2015 1436   UROBILINOGEN 0.2 10/23/2014 1606   NITRITE NEGATIVE 03/04/2015 1436   NITRITE NEG 10/23/2014 1606   LEUKOCYTESUR NEGATIVE 03/04/2015 1436   Sepsis Labs Invalid input(s): PROCALCITONIN,  WBC,  LACTICIDVEN Microbiology Recent Results (from the past 240 hour(s))  MRSA PCR Screening     Status: None   Collection Time: 10/16/15  9:16 AM  Result Value Ref Range Status   MRSA by PCR NEGATIVE NEGATIVE Final    Comment:        The GeneXpert MRSA Assay (FDA approved for NASAL specimens only), is one component of a comprehensive MRSA colonization surveillance program. It is not intended to diagnose MRSA infection nor to guide or monitor treatment for MRSA infections.      Time coordinating discharge: Over 30  minutes  SIGNED:   Hosie Poisson, MD  [facility] 10/20/2015, 1:17 AM Pager [phone]  If 7PM-7AM, please contact night-coverage [email or web address] Password [email or web address]

## 2015-10-21 DIAGNOSIS — N2581 Secondary hyperparathyroidism of renal origin: Secondary | ICD-10-CM | POA: Diagnosis not present

## 2015-10-21 DIAGNOSIS — N186 End stage renal disease: Secondary | ICD-10-CM | POA: Diagnosis not present

## 2015-10-21 DIAGNOSIS — Z23 Encounter for immunization: Secondary | ICD-10-CM | POA: Diagnosis not present

## 2015-10-21 DIAGNOSIS — D631 Anemia in chronic kidney disease: Secondary | ICD-10-CM | POA: Diagnosis not present

## 2015-10-23 DIAGNOSIS — N186 End stage renal disease: Secondary | ICD-10-CM | POA: Diagnosis not present

## 2015-10-23 DIAGNOSIS — Z23 Encounter for immunization: Secondary | ICD-10-CM | POA: Diagnosis not present

## 2015-10-23 DIAGNOSIS — N2581 Secondary hyperparathyroidism of renal origin: Secondary | ICD-10-CM | POA: Diagnosis not present

## 2015-10-23 DIAGNOSIS — D631 Anemia in chronic kidney disease: Secondary | ICD-10-CM | POA: Diagnosis not present

## 2015-10-24 DIAGNOSIS — I158 Other secondary hypertension: Secondary | ICD-10-CM | POA: Diagnosis not present

## 2015-10-24 DIAGNOSIS — Z992 Dependence on renal dialysis: Secondary | ICD-10-CM | POA: Diagnosis not present

## 2015-10-24 DIAGNOSIS — N186 End stage renal disease: Secondary | ICD-10-CM | POA: Diagnosis not present

## 2015-10-25 DIAGNOSIS — N186 End stage renal disease: Secondary | ICD-10-CM | POA: Diagnosis not present

## 2015-10-25 DIAGNOSIS — D631 Anemia in chronic kidney disease: Secondary | ICD-10-CM | POA: Diagnosis not present

## 2015-10-25 DIAGNOSIS — N2581 Secondary hyperparathyroidism of renal origin: Secondary | ICD-10-CM | POA: Diagnosis not present

## 2015-10-25 DIAGNOSIS — D509 Iron deficiency anemia, unspecified: Secondary | ICD-10-CM | POA: Diagnosis not present

## 2015-10-25 DIAGNOSIS — R509 Fever, unspecified: Secondary | ICD-10-CM | POA: Diagnosis not present

## 2015-10-28 DIAGNOSIS — N186 End stage renal disease: Secondary | ICD-10-CM | POA: Diagnosis not present

## 2015-10-28 DIAGNOSIS — N2581 Secondary hyperparathyroidism of renal origin: Secondary | ICD-10-CM | POA: Diagnosis not present

## 2015-10-28 DIAGNOSIS — D509 Iron deficiency anemia, unspecified: Secondary | ICD-10-CM | POA: Diagnosis not present

## 2015-10-28 DIAGNOSIS — R509 Fever, unspecified: Secondary | ICD-10-CM | POA: Diagnosis not present

## 2015-10-28 DIAGNOSIS — D631 Anemia in chronic kidney disease: Secondary | ICD-10-CM | POA: Diagnosis not present

## 2015-10-30 DIAGNOSIS — N2581 Secondary hyperparathyroidism of renal origin: Secondary | ICD-10-CM | POA: Diagnosis not present

## 2015-10-30 DIAGNOSIS — N186 End stage renal disease: Secondary | ICD-10-CM | POA: Diagnosis not present

## 2015-10-30 DIAGNOSIS — D509 Iron deficiency anemia, unspecified: Secondary | ICD-10-CM | POA: Diagnosis not present

## 2015-10-30 DIAGNOSIS — D631 Anemia in chronic kidney disease: Secondary | ICD-10-CM | POA: Diagnosis not present

## 2015-10-30 DIAGNOSIS — R509 Fever, unspecified: Secondary | ICD-10-CM | POA: Diagnosis not present

## 2015-11-01 DIAGNOSIS — R509 Fever, unspecified: Secondary | ICD-10-CM | POA: Diagnosis not present

## 2015-11-01 DIAGNOSIS — N2581 Secondary hyperparathyroidism of renal origin: Secondary | ICD-10-CM | POA: Diagnosis not present

## 2015-11-01 DIAGNOSIS — D631 Anemia in chronic kidney disease: Secondary | ICD-10-CM | POA: Diagnosis not present

## 2015-11-01 DIAGNOSIS — D509 Iron deficiency anemia, unspecified: Secondary | ICD-10-CM | POA: Diagnosis not present

## 2015-11-01 DIAGNOSIS — N186 End stage renal disease: Secondary | ICD-10-CM | POA: Diagnosis not present

## 2015-11-04 DIAGNOSIS — N186 End stage renal disease: Secondary | ICD-10-CM | POA: Diagnosis not present

## 2015-11-04 DIAGNOSIS — N2581 Secondary hyperparathyroidism of renal origin: Secondary | ICD-10-CM | POA: Diagnosis not present

## 2015-11-04 DIAGNOSIS — D631 Anemia in chronic kidney disease: Secondary | ICD-10-CM | POA: Diagnosis not present

## 2015-11-04 DIAGNOSIS — R509 Fever, unspecified: Secondary | ICD-10-CM | POA: Diagnosis not present

## 2015-11-04 DIAGNOSIS — D509 Iron deficiency anemia, unspecified: Secondary | ICD-10-CM | POA: Diagnosis not present

## 2015-11-06 DIAGNOSIS — D631 Anemia in chronic kidney disease: Secondary | ICD-10-CM | POA: Diagnosis not present

## 2015-11-06 DIAGNOSIS — R509 Fever, unspecified: Secondary | ICD-10-CM | POA: Diagnosis not present

## 2015-11-06 DIAGNOSIS — N186 End stage renal disease: Secondary | ICD-10-CM | POA: Diagnosis not present

## 2015-11-06 DIAGNOSIS — D509 Iron deficiency anemia, unspecified: Secondary | ICD-10-CM | POA: Diagnosis not present

## 2015-11-06 DIAGNOSIS — N2581 Secondary hyperparathyroidism of renal origin: Secondary | ICD-10-CM | POA: Diagnosis not present

## 2015-11-08 DIAGNOSIS — D631 Anemia in chronic kidney disease: Secondary | ICD-10-CM | POA: Diagnosis not present

## 2015-11-08 DIAGNOSIS — N186 End stage renal disease: Secondary | ICD-10-CM | POA: Diagnosis not present

## 2015-11-08 DIAGNOSIS — R509 Fever, unspecified: Secondary | ICD-10-CM | POA: Diagnosis not present

## 2015-11-08 DIAGNOSIS — N2581 Secondary hyperparathyroidism of renal origin: Secondary | ICD-10-CM | POA: Diagnosis not present

## 2015-11-08 DIAGNOSIS — D509 Iron deficiency anemia, unspecified: Secondary | ICD-10-CM | POA: Diagnosis not present

## 2015-11-11 DIAGNOSIS — N186 End stage renal disease: Secondary | ICD-10-CM | POA: Diagnosis not present

## 2015-11-11 DIAGNOSIS — D509 Iron deficiency anemia, unspecified: Secondary | ICD-10-CM | POA: Diagnosis not present

## 2015-11-11 DIAGNOSIS — N2581 Secondary hyperparathyroidism of renal origin: Secondary | ICD-10-CM | POA: Diagnosis not present

## 2015-11-11 DIAGNOSIS — R509 Fever, unspecified: Secondary | ICD-10-CM | POA: Diagnosis not present

## 2015-11-11 DIAGNOSIS — D631 Anemia in chronic kidney disease: Secondary | ICD-10-CM | POA: Diagnosis not present

## 2015-11-13 DIAGNOSIS — N2581 Secondary hyperparathyroidism of renal origin: Secondary | ICD-10-CM | POA: Diagnosis not present

## 2015-11-13 DIAGNOSIS — D631 Anemia in chronic kidney disease: Secondary | ICD-10-CM | POA: Diagnosis not present

## 2015-11-13 DIAGNOSIS — R509 Fever, unspecified: Secondary | ICD-10-CM | POA: Diagnosis not present

## 2015-11-13 DIAGNOSIS — D509 Iron deficiency anemia, unspecified: Secondary | ICD-10-CM | POA: Diagnosis not present

## 2015-11-13 DIAGNOSIS — N186 End stage renal disease: Secondary | ICD-10-CM | POA: Diagnosis not present

## 2015-11-15 DIAGNOSIS — N186 End stage renal disease: Secondary | ICD-10-CM | POA: Diagnosis not present

## 2015-11-15 DIAGNOSIS — D631 Anemia in chronic kidney disease: Secondary | ICD-10-CM | POA: Diagnosis not present

## 2015-11-15 DIAGNOSIS — R509 Fever, unspecified: Secondary | ICD-10-CM | POA: Diagnosis not present

## 2015-11-15 DIAGNOSIS — D509 Iron deficiency anemia, unspecified: Secondary | ICD-10-CM | POA: Diagnosis not present

## 2015-11-15 DIAGNOSIS — N2581 Secondary hyperparathyroidism of renal origin: Secondary | ICD-10-CM | POA: Diagnosis not present

## 2015-11-18 DIAGNOSIS — N2581 Secondary hyperparathyroidism of renal origin: Secondary | ICD-10-CM | POA: Diagnosis not present

## 2015-11-18 DIAGNOSIS — D509 Iron deficiency anemia, unspecified: Secondary | ICD-10-CM | POA: Diagnosis not present

## 2015-11-18 DIAGNOSIS — D631 Anemia in chronic kidney disease: Secondary | ICD-10-CM | POA: Diagnosis not present

## 2015-11-18 DIAGNOSIS — N186 End stage renal disease: Secondary | ICD-10-CM | POA: Diagnosis not present

## 2015-11-18 DIAGNOSIS — R509 Fever, unspecified: Secondary | ICD-10-CM | POA: Diagnosis not present

## 2015-11-20 DIAGNOSIS — N2581 Secondary hyperparathyroidism of renal origin: Secondary | ICD-10-CM | POA: Diagnosis not present

## 2015-11-20 DIAGNOSIS — N186 End stage renal disease: Secondary | ICD-10-CM | POA: Diagnosis not present

## 2015-11-20 DIAGNOSIS — R509 Fever, unspecified: Secondary | ICD-10-CM | POA: Diagnosis not present

## 2015-11-20 DIAGNOSIS — D509 Iron deficiency anemia, unspecified: Secondary | ICD-10-CM | POA: Diagnosis not present

## 2015-11-20 DIAGNOSIS — D631 Anemia in chronic kidney disease: Secondary | ICD-10-CM | POA: Diagnosis not present

## 2015-11-22 DIAGNOSIS — D509 Iron deficiency anemia, unspecified: Secondary | ICD-10-CM | POA: Diagnosis not present

## 2015-11-22 DIAGNOSIS — R509 Fever, unspecified: Secondary | ICD-10-CM | POA: Diagnosis not present

## 2015-11-22 DIAGNOSIS — D631 Anemia in chronic kidney disease: Secondary | ICD-10-CM | POA: Diagnosis not present

## 2015-11-22 DIAGNOSIS — N2581 Secondary hyperparathyroidism of renal origin: Secondary | ICD-10-CM | POA: Diagnosis not present

## 2015-11-22 DIAGNOSIS — N186 End stage renal disease: Secondary | ICD-10-CM | POA: Diagnosis not present

## 2015-11-24 DIAGNOSIS — I158 Other secondary hypertension: Secondary | ICD-10-CM | POA: Diagnosis not present

## 2015-11-24 DIAGNOSIS — N186 End stage renal disease: Secondary | ICD-10-CM | POA: Diagnosis not present

## 2015-11-24 DIAGNOSIS — Z992 Dependence on renal dialysis: Secondary | ICD-10-CM | POA: Diagnosis not present

## 2015-11-25 DIAGNOSIS — N2581 Secondary hyperparathyroidism of renal origin: Secondary | ICD-10-CM | POA: Diagnosis not present

## 2015-11-25 DIAGNOSIS — N186 End stage renal disease: Secondary | ICD-10-CM | POA: Diagnosis not present

## 2015-11-25 DIAGNOSIS — D631 Anemia in chronic kidney disease: Secondary | ICD-10-CM | POA: Diagnosis not present

## 2015-11-27 DIAGNOSIS — D631 Anemia in chronic kidney disease: Secondary | ICD-10-CM | POA: Diagnosis not present

## 2015-11-27 DIAGNOSIS — N186 End stage renal disease: Secondary | ICD-10-CM | POA: Diagnosis not present

## 2015-11-27 DIAGNOSIS — N2581 Secondary hyperparathyroidism of renal origin: Secondary | ICD-10-CM | POA: Diagnosis not present

## 2015-11-29 DIAGNOSIS — N2581 Secondary hyperparathyroidism of renal origin: Secondary | ICD-10-CM | POA: Diagnosis not present

## 2015-11-29 DIAGNOSIS — D631 Anemia in chronic kidney disease: Secondary | ICD-10-CM | POA: Diagnosis not present

## 2015-11-29 DIAGNOSIS — N186 End stage renal disease: Secondary | ICD-10-CM | POA: Diagnosis not present

## 2015-12-02 DIAGNOSIS — N186 End stage renal disease: Secondary | ICD-10-CM | POA: Diagnosis not present

## 2015-12-02 DIAGNOSIS — D631 Anemia in chronic kidney disease: Secondary | ICD-10-CM | POA: Diagnosis not present

## 2015-12-02 DIAGNOSIS — N2581 Secondary hyperparathyroidism of renal origin: Secondary | ICD-10-CM | POA: Diagnosis not present

## 2015-12-04 DIAGNOSIS — N186 End stage renal disease: Secondary | ICD-10-CM | POA: Diagnosis not present

## 2015-12-04 DIAGNOSIS — N2581 Secondary hyperparathyroidism of renal origin: Secondary | ICD-10-CM | POA: Diagnosis not present

## 2015-12-04 DIAGNOSIS — D631 Anemia in chronic kidney disease: Secondary | ICD-10-CM | POA: Diagnosis not present

## 2015-12-06 DIAGNOSIS — N2581 Secondary hyperparathyroidism of renal origin: Secondary | ICD-10-CM | POA: Diagnosis not present

## 2015-12-06 DIAGNOSIS — D631 Anemia in chronic kidney disease: Secondary | ICD-10-CM | POA: Diagnosis not present

## 2015-12-06 DIAGNOSIS — N186 End stage renal disease: Secondary | ICD-10-CM | POA: Diagnosis not present

## 2015-12-09 DIAGNOSIS — N186 End stage renal disease: Secondary | ICD-10-CM | POA: Diagnosis not present

## 2015-12-09 DIAGNOSIS — D631 Anemia in chronic kidney disease: Secondary | ICD-10-CM | POA: Diagnosis not present

## 2015-12-09 DIAGNOSIS — N2581 Secondary hyperparathyroidism of renal origin: Secondary | ICD-10-CM | POA: Diagnosis not present

## 2015-12-11 DIAGNOSIS — N2581 Secondary hyperparathyroidism of renal origin: Secondary | ICD-10-CM | POA: Diagnosis not present

## 2015-12-11 DIAGNOSIS — N186 End stage renal disease: Secondary | ICD-10-CM | POA: Diagnosis not present

## 2015-12-11 DIAGNOSIS — D631 Anemia in chronic kidney disease: Secondary | ICD-10-CM | POA: Diagnosis not present

## 2015-12-13 DIAGNOSIS — N2581 Secondary hyperparathyroidism of renal origin: Secondary | ICD-10-CM | POA: Diagnosis not present

## 2015-12-13 DIAGNOSIS — N186 End stage renal disease: Secondary | ICD-10-CM | POA: Diagnosis not present

## 2015-12-13 DIAGNOSIS — D631 Anemia in chronic kidney disease: Secondary | ICD-10-CM | POA: Diagnosis not present

## 2015-12-16 DIAGNOSIS — D631 Anemia in chronic kidney disease: Secondary | ICD-10-CM | POA: Diagnosis not present

## 2015-12-16 DIAGNOSIS — N186 End stage renal disease: Secondary | ICD-10-CM | POA: Diagnosis not present

## 2015-12-16 DIAGNOSIS — N2581 Secondary hyperparathyroidism of renal origin: Secondary | ICD-10-CM | POA: Diagnosis not present

## 2015-12-18 DIAGNOSIS — D631 Anemia in chronic kidney disease: Secondary | ICD-10-CM | POA: Diagnosis not present

## 2015-12-18 DIAGNOSIS — N186 End stage renal disease: Secondary | ICD-10-CM | POA: Diagnosis not present

## 2015-12-18 DIAGNOSIS — N2581 Secondary hyperparathyroidism of renal origin: Secondary | ICD-10-CM | POA: Diagnosis not present

## 2015-12-20 DIAGNOSIS — N186 End stage renal disease: Secondary | ICD-10-CM | POA: Diagnosis not present

## 2015-12-20 DIAGNOSIS — N2581 Secondary hyperparathyroidism of renal origin: Secondary | ICD-10-CM | POA: Diagnosis not present

## 2015-12-20 DIAGNOSIS — D631 Anemia in chronic kidney disease: Secondary | ICD-10-CM | POA: Diagnosis not present

## 2015-12-22 ENCOUNTER — Encounter: Payer: Self-pay | Admitting: Family Medicine

## 2015-12-22 ENCOUNTER — Ambulatory Visit (INDEPENDENT_AMBULATORY_CARE_PROVIDER_SITE_OTHER): Payer: Medicare Other | Admitting: Family Medicine

## 2015-12-22 VITALS — BP 129/85 | HR 90 | Temp 98.5°F | Ht 64.0 in | Wt 169.2 lb

## 2015-12-22 DIAGNOSIS — R208 Other disturbances of skin sensation: Secondary | ICD-10-CM

## 2015-12-22 DIAGNOSIS — Z992 Dependence on renal dialysis: Secondary | ICD-10-CM

## 2015-12-22 DIAGNOSIS — N186 End stage renal disease: Secondary | ICD-10-CM | POA: Diagnosis not present

## 2015-12-22 DIAGNOSIS — R200 Anesthesia of skin: Secondary | ICD-10-CM

## 2015-12-22 LAB — BASIC METABOLIC PANEL WITH GFR
BUN: 33 mg/dL — AB (ref 7–25)
CALCIUM: 9.2 mg/dL (ref 8.6–10.2)
CO2: 25 mmol/L (ref 20–31)
Chloride: 96 mmol/L — ABNORMAL LOW (ref 98–110)
Creat: 8.18 mg/dL — ABNORMAL HIGH (ref 0.50–1.10)
GFR, EST AFRICAN AMERICAN: 7 mL/min — AB (ref >=60)
GFR, EST NON AFRICAN AMERICAN: 6 mL/min — AB (ref >=60)
GLUCOSE: 78 mg/dL (ref 65–99)
POTASSIUM: 4.2 mmol/L (ref 3.5–5.3)
Sodium: 139 mmol/L (ref 135–146)

## 2015-12-22 LAB — CBC WITH DIFFERENTIAL/PLATELET
BASOS ABS: 0 {cells}/uL (ref 0–200)
Basophils Relative: 0 %
EOS PCT: 3 %
Eosinophils Absolute: 186 cells/uL (ref 15–500)
HEMATOCRIT: 34.2 % — AB (ref 35.0–45.0)
HEMOGLOBIN: 11.6 g/dL — AB (ref 11.7–15.5)
LYMPHS ABS: 1922 {cells}/uL (ref 850–3900)
Lymphocytes Relative: 31 %
MCH: 28.1 pg (ref 27.0–33.0)
MCHC: 33.9 g/dL (ref 32.0–36.0)
MCV: 82.8 fL (ref 80.0–100.0)
MONO ABS: 372 {cells}/uL (ref 200–950)
MPV: 10.1 fL (ref 7.5–12.5)
Monocytes Relative: 6 %
NEUTROS PCT: 60 %
Neutro Abs: 3720 cells/uL (ref 1500–7800)
Platelets: 269 10*3/uL (ref 140–400)
RBC: 4.13 MIL/uL (ref 3.80–5.10)
RDW: 15.2 % — ABNORMAL HIGH (ref 11.0–15.0)
WBC: 6.2 10*3/uL (ref 3.8–10.8)

## 2015-12-22 NOTE — Assessment & Plan Note (Signed)
ESRD on HD dx 2016. Sees Dr Marval Regal with [facility].  Tues, Thurs, Sat schedule. Recent hospitalization for paranoid behavior in HD.

## 2015-12-22 NOTE — Patient Instructions (Signed)
Your numbness might be a symptoms of your migraine headaches.  I want you to take your Imitrex the next time you have a headache with the numbness and see if that does not relieve it.  If it does not, we can refer you to neurology for further evaluation.  Also, when you get ready to have your pap smear done, let me know about 3 weeks ahead of time and we will refer you to GYN for this.    I have ordered a CBC and a BMP (blood labs) at the request of the doctor you saw in the hospital.  I will contact you will the results of your labs.  If anything is abnormal, I will call you.  Otherwise, expect a copy to be mailed to you.

## 2015-12-22 NOTE — Progress Notes (Signed)
    Subjective: CC: ED follow up for SI/ noncompliance w/ HD; forehead numbness HPI: Vanessa Alvarez is a 30 y.o. female presenting to clinic today for follow up. Concerns today include:  1. Forehead numbness Patient reports temporal numbness accompanied by headaches.  She reports symptoms occur maybe 3 times weekly.  She reports that symptoms last maybe 5 minutes and it resolves independently.  No accompanying nausea, vomiting, blurred vision.    2. ESRD on HD Receives HD on Tues, Thurs, Sat w/ Dr Marval Regal.  No missed sessions or shortened sessions lately.  Taking all vitamins and meds prescribed by renal.  Additionally, patient's mother notes that she needs a pap smear in order to be placed on the donor list for kidney transplant.  She voices concern because patient is a virgin.  She would like a referral to GYN for this sometime in the future.  Social History Reviewed: non smoker. FamHx and MedHx reviewed.  Please see EMR. Health Maintenance: pap smear  ROS: Per HPI  Objective: Office vital signs reviewed. BP 129/85   Pulse 90   Temp 98.5 F (36.9 C) (Oral)   Ht 5\' 4"  (1.626 m)   Wt 169 lb 3.2 oz (76.7 kg)   BMI 29.04 kg/m   Physical Examination:  General: Awake, alert, No acute distress Eyes: PERRLA, exotropia noted Ext: no edema, WWP, multiple scars on UE w/ LUE HD fistula Skin: dry, intact, no rashes or lesions Neuro: Strength and sensation grossly intact, normal light touch sensation to entire face, CN 2-12 grossly in tact with the exception of exotropia of the eyes.  Assessment/ Plan: 30 y.o. female   ESRD on dialysis [facility]) ESRD on HD dx 2016. Sees Dr Marval Regal with [facility].  Tues, Thurs, Sat schedule. Recent hospitalization for paranoid behavior in HD.   - CBC with Differential/Platelet - BASIC METABOLIC PANEL WITH GFR  Numbness of face.  No focal deficits on exam today.  Not experiencing symptoms.  At risk for cardiovascular events given  ESRD on HD.  However, I suspect that patient's symptoms are more of a complex migraine.  Discussed the increased risk of serotonin syndrome with Zoloft and Imitrex. - Recommended taking imitrex when symptoms occur - If no improvement with medication, will plan to refer to Neurology.  Patient's mother to contact me if desires neuro or gyn referral before our next appointment in 3 months.   Vanessa Norlander, DO PGY-3, [facility]

## 2015-12-23 ENCOUNTER — Telehealth: Payer: Self-pay | Admitting: *Deleted

## 2015-12-23 DIAGNOSIS — D631 Anemia in chronic kidney disease: Secondary | ICD-10-CM | POA: Diagnosis not present

## 2015-12-23 DIAGNOSIS — N2581 Secondary hyperparathyroidism of renal origin: Secondary | ICD-10-CM | POA: Diagnosis not present

## 2015-12-23 DIAGNOSIS — N186 End stage renal disease: Secondary | ICD-10-CM | POA: Diagnosis not present

## 2015-12-23 NOTE — Telephone Encounter (Signed)
William calling from [facility] to report critical value for creatinine - 8.18.  Called and informed Dr. Lajuana Ripple of lab result.  Noted - patient is currently on HD and is being followed by Dr. Marval Regal. Burna Forts, BSN, RN-BC

## 2015-12-25 DIAGNOSIS — N186 End stage renal disease: Secondary | ICD-10-CM | POA: Diagnosis not present

## 2015-12-25 DIAGNOSIS — D631 Anemia in chronic kidney disease: Secondary | ICD-10-CM | POA: Diagnosis not present

## 2015-12-25 DIAGNOSIS — Z992 Dependence on renal dialysis: Secondary | ICD-10-CM | POA: Diagnosis not present

## 2015-12-25 DIAGNOSIS — I158 Other secondary hypertension: Secondary | ICD-10-CM | POA: Diagnosis not present

## 2015-12-25 DIAGNOSIS — N2581 Secondary hyperparathyroidism of renal origin: Secondary | ICD-10-CM | POA: Diagnosis not present

## 2015-12-27 DIAGNOSIS — N186 End stage renal disease: Secondary | ICD-10-CM | POA: Diagnosis not present

## 2015-12-27 DIAGNOSIS — N2581 Secondary hyperparathyroidism of renal origin: Secondary | ICD-10-CM | POA: Diagnosis not present

## 2015-12-27 DIAGNOSIS — Z23 Encounter for immunization: Secondary | ICD-10-CM | POA: Diagnosis not present

## 2015-12-30 DIAGNOSIS — N2581 Secondary hyperparathyroidism of renal origin: Secondary | ICD-10-CM | POA: Diagnosis not present

## 2015-12-30 DIAGNOSIS — N186 End stage renal disease: Secondary | ICD-10-CM | POA: Diagnosis not present

## 2015-12-30 DIAGNOSIS — Z23 Encounter for immunization: Secondary | ICD-10-CM | POA: Diagnosis not present

## 2016-01-01 DIAGNOSIS — N186 End stage renal disease: Secondary | ICD-10-CM | POA: Diagnosis not present

## 2016-01-01 DIAGNOSIS — Z23 Encounter for immunization: Secondary | ICD-10-CM | POA: Diagnosis not present

## 2016-01-01 DIAGNOSIS — N2581 Secondary hyperparathyroidism of renal origin: Secondary | ICD-10-CM | POA: Diagnosis not present

## 2016-01-03 DIAGNOSIS — N2581 Secondary hyperparathyroidism of renal origin: Secondary | ICD-10-CM | POA: Diagnosis not present

## 2016-01-03 DIAGNOSIS — N186 End stage renal disease: Secondary | ICD-10-CM | POA: Diagnosis not present

## 2016-01-03 DIAGNOSIS — Z23 Encounter for immunization: Secondary | ICD-10-CM | POA: Diagnosis not present

## 2016-01-06 DIAGNOSIS — Z23 Encounter for immunization: Secondary | ICD-10-CM | POA: Diagnosis not present

## 2016-01-06 DIAGNOSIS — N186 End stage renal disease: Secondary | ICD-10-CM | POA: Diagnosis not present

## 2016-01-06 DIAGNOSIS — N2581 Secondary hyperparathyroidism of renal origin: Secondary | ICD-10-CM | POA: Diagnosis not present

## 2016-01-08 ENCOUNTER — Other Ambulatory Visit: Payer: Self-pay | Admitting: Family Medicine

## 2016-01-08 DIAGNOSIS — Z23 Encounter for immunization: Secondary | ICD-10-CM | POA: Diagnosis not present

## 2016-01-08 DIAGNOSIS — E039 Hypothyroidism, unspecified: Secondary | ICD-10-CM

## 2016-01-08 DIAGNOSIS — N186 End stage renal disease: Secondary | ICD-10-CM | POA: Diagnosis not present

## 2016-01-08 DIAGNOSIS — N2581 Secondary hyperparathyroidism of renal origin: Secondary | ICD-10-CM | POA: Diagnosis not present

## 2016-01-09 DIAGNOSIS — F71 Moderate intellectual disabilities: Secondary | ICD-10-CM | POA: Diagnosis not present

## 2016-01-09 DIAGNOSIS — F319 Bipolar disorder, unspecified: Secondary | ICD-10-CM | POA: Diagnosis not present

## 2016-01-10 DIAGNOSIS — Z23 Encounter for immunization: Secondary | ICD-10-CM | POA: Diagnosis not present

## 2016-01-10 DIAGNOSIS — N2581 Secondary hyperparathyroidism of renal origin: Secondary | ICD-10-CM | POA: Diagnosis not present

## 2016-01-10 DIAGNOSIS — N186 End stage renal disease: Secondary | ICD-10-CM | POA: Diagnosis not present

## 2016-01-12 ENCOUNTER — Other Ambulatory Visit: Payer: Self-pay | Admitting: Family Medicine

## 2016-01-12 ENCOUNTER — Telehealth: Payer: Self-pay | Admitting: *Deleted

## 2016-01-12 DIAGNOSIS — R519 Headache, unspecified: Secondary | ICD-10-CM

## 2016-01-12 DIAGNOSIS — R51 Headache: Principal | ICD-10-CM

## 2016-01-12 NOTE — Telephone Encounter (Signed)
Done

## 2016-01-12 NOTE — Telephone Encounter (Signed)
Mother called and states that patient would like for provider to go ahead and place referral for neurology.  They can contact either mom or patient with an appointment. Annelisa Ryback,CMA

## 2016-01-13 DIAGNOSIS — N2581 Secondary hyperparathyroidism of renal origin: Secondary | ICD-10-CM | POA: Diagnosis not present

## 2016-01-13 DIAGNOSIS — N186 End stage renal disease: Secondary | ICD-10-CM | POA: Diagnosis not present

## 2016-01-13 DIAGNOSIS — Z23 Encounter for immunization: Secondary | ICD-10-CM | POA: Diagnosis not present

## 2016-01-14 DIAGNOSIS — F329 Major depressive disorder, single episode, unspecified: Secondary | ICD-10-CM | POA: Diagnosis not present

## 2016-01-14 DIAGNOSIS — F71 Moderate intellectual disabilities: Secondary | ICD-10-CM | POA: Diagnosis not present

## 2016-01-15 DIAGNOSIS — Z23 Encounter for immunization: Secondary | ICD-10-CM | POA: Diagnosis not present

## 2016-01-15 DIAGNOSIS — N2581 Secondary hyperparathyroidism of renal origin: Secondary | ICD-10-CM | POA: Diagnosis not present

## 2016-01-15 DIAGNOSIS — N186 End stage renal disease: Secondary | ICD-10-CM | POA: Diagnosis not present

## 2016-01-17 DIAGNOSIS — N186 End stage renal disease: Secondary | ICD-10-CM | POA: Diagnosis not present

## 2016-01-17 DIAGNOSIS — Z23 Encounter for immunization: Secondary | ICD-10-CM | POA: Diagnosis not present

## 2016-01-17 DIAGNOSIS — N2581 Secondary hyperparathyroidism of renal origin: Secondary | ICD-10-CM | POA: Diagnosis not present

## 2016-01-20 DIAGNOSIS — N2581 Secondary hyperparathyroidism of renal origin: Secondary | ICD-10-CM | POA: Diagnosis not present

## 2016-01-20 DIAGNOSIS — N186 End stage renal disease: Secondary | ICD-10-CM | POA: Diagnosis not present

## 2016-01-20 DIAGNOSIS — Z23 Encounter for immunization: Secondary | ICD-10-CM | POA: Diagnosis not present

## 2016-01-22 DIAGNOSIS — Z23 Encounter for immunization: Secondary | ICD-10-CM | POA: Diagnosis not present

## 2016-01-22 DIAGNOSIS — N186 End stage renal disease: Secondary | ICD-10-CM | POA: Diagnosis not present

## 2016-01-22 DIAGNOSIS — N2581 Secondary hyperparathyroidism of renal origin: Secondary | ICD-10-CM | POA: Diagnosis not present

## 2016-01-24 DIAGNOSIS — N2581 Secondary hyperparathyroidism of renal origin: Secondary | ICD-10-CM | POA: Diagnosis not present

## 2016-01-24 DIAGNOSIS — Z23 Encounter for immunization: Secondary | ICD-10-CM | POA: Diagnosis not present

## 2016-01-24 DIAGNOSIS — I158 Other secondary hypertension: Secondary | ICD-10-CM | POA: Diagnosis not present

## 2016-01-24 DIAGNOSIS — N186 End stage renal disease: Secondary | ICD-10-CM | POA: Diagnosis not present

## 2016-01-24 DIAGNOSIS — Z992 Dependence on renal dialysis: Secondary | ICD-10-CM | POA: Diagnosis not present

## 2016-01-27 DIAGNOSIS — N186 End stage renal disease: Secondary | ICD-10-CM | POA: Diagnosis not present

## 2016-01-27 DIAGNOSIS — N2581 Secondary hyperparathyroidism of renal origin: Secondary | ICD-10-CM | POA: Diagnosis not present

## 2016-01-29 DIAGNOSIS — N186 End stage renal disease: Secondary | ICD-10-CM | POA: Diagnosis not present

## 2016-01-29 DIAGNOSIS — N2581 Secondary hyperparathyroidism of renal origin: Secondary | ICD-10-CM | POA: Diagnosis not present

## 2016-01-31 DIAGNOSIS — N186 End stage renal disease: Secondary | ICD-10-CM | POA: Diagnosis not present

## 2016-01-31 DIAGNOSIS — N2581 Secondary hyperparathyroidism of renal origin: Secondary | ICD-10-CM | POA: Diagnosis not present

## 2016-02-01 ENCOUNTER — Other Ambulatory Visit: Payer: Self-pay | Admitting: Family Medicine

## 2016-02-02 DIAGNOSIS — F71 Moderate intellectual disabilities: Secondary | ICD-10-CM | POA: Diagnosis not present

## 2016-02-02 DIAGNOSIS — F329 Major depressive disorder, single episode, unspecified: Secondary | ICD-10-CM | POA: Diagnosis not present

## 2016-02-03 DIAGNOSIS — N2581 Secondary hyperparathyroidism of renal origin: Secondary | ICD-10-CM | POA: Diagnosis not present

## 2016-02-03 DIAGNOSIS — N186 End stage renal disease: Secondary | ICD-10-CM | POA: Diagnosis not present

## 2016-02-05 DIAGNOSIS — N2581 Secondary hyperparathyroidism of renal origin: Secondary | ICD-10-CM | POA: Diagnosis not present

## 2016-02-05 DIAGNOSIS — N186 End stage renal disease: Secondary | ICD-10-CM | POA: Diagnosis not present

## 2016-02-07 DIAGNOSIS — N2581 Secondary hyperparathyroidism of renal origin: Secondary | ICD-10-CM | POA: Diagnosis not present

## 2016-02-07 DIAGNOSIS — N186 End stage renal disease: Secondary | ICD-10-CM | POA: Diagnosis not present

## 2016-02-10 DIAGNOSIS — N186 End stage renal disease: Secondary | ICD-10-CM | POA: Diagnosis not present

## 2016-02-10 DIAGNOSIS — N2581 Secondary hyperparathyroidism of renal origin: Secondary | ICD-10-CM | POA: Diagnosis not present

## 2016-02-12 DIAGNOSIS — N186 End stage renal disease: Secondary | ICD-10-CM | POA: Diagnosis not present

## 2016-02-12 DIAGNOSIS — N2581 Secondary hyperparathyroidism of renal origin: Secondary | ICD-10-CM | POA: Diagnosis not present

## 2016-02-14 DIAGNOSIS — N2581 Secondary hyperparathyroidism of renal origin: Secondary | ICD-10-CM | POA: Diagnosis not present

## 2016-02-14 DIAGNOSIS — N186 End stage renal disease: Secondary | ICD-10-CM | POA: Diagnosis not present

## 2016-02-17 DIAGNOSIS — N186 End stage renal disease: Secondary | ICD-10-CM | POA: Diagnosis not present

## 2016-02-17 DIAGNOSIS — N2581 Secondary hyperparathyroidism of renal origin: Secondary | ICD-10-CM | POA: Diagnosis not present

## 2016-02-19 DIAGNOSIS — N2581 Secondary hyperparathyroidism of renal origin: Secondary | ICD-10-CM | POA: Diagnosis not present

## 2016-02-19 DIAGNOSIS — N186 End stage renal disease: Secondary | ICD-10-CM | POA: Diagnosis not present

## 2016-02-21 DIAGNOSIS — N186 End stage renal disease: Secondary | ICD-10-CM | POA: Diagnosis not present

## 2016-02-21 DIAGNOSIS — N2581 Secondary hyperparathyroidism of renal origin: Secondary | ICD-10-CM | POA: Diagnosis not present

## 2016-02-24 DIAGNOSIS — N2581 Secondary hyperparathyroidism of renal origin: Secondary | ICD-10-CM | POA: Diagnosis not present

## 2016-02-24 DIAGNOSIS — I158 Other secondary hypertension: Secondary | ICD-10-CM | POA: Diagnosis not present

## 2016-02-24 DIAGNOSIS — N186 End stage renal disease: Secondary | ICD-10-CM | POA: Diagnosis not present

## 2016-02-24 DIAGNOSIS — Z992 Dependence on renal dialysis: Secondary | ICD-10-CM | POA: Diagnosis not present

## 2016-02-26 DIAGNOSIS — N2581 Secondary hyperparathyroidism of renal origin: Secondary | ICD-10-CM | POA: Diagnosis not present

## 2016-02-26 DIAGNOSIS — D509 Iron deficiency anemia, unspecified: Secondary | ICD-10-CM | POA: Diagnosis not present

## 2016-02-26 DIAGNOSIS — D631 Anemia in chronic kidney disease: Secondary | ICD-10-CM | POA: Diagnosis not present

## 2016-02-26 DIAGNOSIS — N186 End stage renal disease: Secondary | ICD-10-CM | POA: Diagnosis not present

## 2016-02-28 DIAGNOSIS — N2581 Secondary hyperparathyroidism of renal origin: Secondary | ICD-10-CM | POA: Diagnosis not present

## 2016-02-28 DIAGNOSIS — D509 Iron deficiency anemia, unspecified: Secondary | ICD-10-CM | POA: Diagnosis not present

## 2016-02-28 DIAGNOSIS — N186 End stage renal disease: Secondary | ICD-10-CM | POA: Diagnosis not present

## 2016-02-28 DIAGNOSIS — D631 Anemia in chronic kidney disease: Secondary | ICD-10-CM | POA: Diagnosis not present

## 2016-03-02 DIAGNOSIS — N2581 Secondary hyperparathyroidism of renal origin: Secondary | ICD-10-CM | POA: Diagnosis not present

## 2016-03-02 DIAGNOSIS — D509 Iron deficiency anemia, unspecified: Secondary | ICD-10-CM | POA: Diagnosis not present

## 2016-03-02 DIAGNOSIS — N186 End stage renal disease: Secondary | ICD-10-CM | POA: Diagnosis not present

## 2016-03-02 DIAGNOSIS — D631 Anemia in chronic kidney disease: Secondary | ICD-10-CM | POA: Diagnosis not present

## 2016-03-04 DIAGNOSIS — D631 Anemia in chronic kidney disease: Secondary | ICD-10-CM | POA: Diagnosis not present

## 2016-03-04 DIAGNOSIS — D509 Iron deficiency anemia, unspecified: Secondary | ICD-10-CM | POA: Diagnosis not present

## 2016-03-04 DIAGNOSIS — N186 End stage renal disease: Secondary | ICD-10-CM | POA: Diagnosis not present

## 2016-03-04 DIAGNOSIS — N2581 Secondary hyperparathyroidism of renal origin: Secondary | ICD-10-CM | POA: Diagnosis not present

## 2016-03-06 DIAGNOSIS — N186 End stage renal disease: Secondary | ICD-10-CM | POA: Diagnosis not present

## 2016-03-06 DIAGNOSIS — D631 Anemia in chronic kidney disease: Secondary | ICD-10-CM | POA: Diagnosis not present

## 2016-03-06 DIAGNOSIS — D509 Iron deficiency anemia, unspecified: Secondary | ICD-10-CM | POA: Diagnosis not present

## 2016-03-06 DIAGNOSIS — N2581 Secondary hyperparathyroidism of renal origin: Secondary | ICD-10-CM | POA: Diagnosis not present

## 2016-03-09 DIAGNOSIS — N2581 Secondary hyperparathyroidism of renal origin: Secondary | ICD-10-CM | POA: Diagnosis not present

## 2016-03-09 DIAGNOSIS — D631 Anemia in chronic kidney disease: Secondary | ICD-10-CM | POA: Diagnosis not present

## 2016-03-09 DIAGNOSIS — N186 End stage renal disease: Secondary | ICD-10-CM | POA: Diagnosis not present

## 2016-03-09 DIAGNOSIS — D509 Iron deficiency anemia, unspecified: Secondary | ICD-10-CM | POA: Diagnosis not present

## 2016-03-11 DIAGNOSIS — D631 Anemia in chronic kidney disease: Secondary | ICD-10-CM | POA: Diagnosis not present

## 2016-03-11 DIAGNOSIS — N186 End stage renal disease: Secondary | ICD-10-CM | POA: Diagnosis not present

## 2016-03-11 DIAGNOSIS — D509 Iron deficiency anemia, unspecified: Secondary | ICD-10-CM | POA: Diagnosis not present

## 2016-03-11 DIAGNOSIS — N2581 Secondary hyperparathyroidism of renal origin: Secondary | ICD-10-CM | POA: Diagnosis not present

## 2016-03-13 DIAGNOSIS — N2581 Secondary hyperparathyroidism of renal origin: Secondary | ICD-10-CM | POA: Diagnosis not present

## 2016-03-13 DIAGNOSIS — D631 Anemia in chronic kidney disease: Secondary | ICD-10-CM | POA: Diagnosis not present

## 2016-03-13 DIAGNOSIS — N186 End stage renal disease: Secondary | ICD-10-CM | POA: Diagnosis not present

## 2016-03-13 DIAGNOSIS — D509 Iron deficiency anemia, unspecified: Secondary | ICD-10-CM | POA: Diagnosis not present

## 2016-03-15 DIAGNOSIS — N186 End stage renal disease: Secondary | ICD-10-CM | POA: Diagnosis not present

## 2016-03-15 DIAGNOSIS — N2581 Secondary hyperparathyroidism of renal origin: Secondary | ICD-10-CM | POA: Diagnosis not present

## 2016-03-15 DIAGNOSIS — D509 Iron deficiency anemia, unspecified: Secondary | ICD-10-CM | POA: Diagnosis not present

## 2016-03-15 DIAGNOSIS — D631 Anemia in chronic kidney disease: Secondary | ICD-10-CM | POA: Diagnosis not present

## 2016-03-17 DIAGNOSIS — N186 End stage renal disease: Secondary | ICD-10-CM | POA: Diagnosis not present

## 2016-03-17 DIAGNOSIS — D631 Anemia in chronic kidney disease: Secondary | ICD-10-CM | POA: Diagnosis not present

## 2016-03-17 DIAGNOSIS — N2581 Secondary hyperparathyroidism of renal origin: Secondary | ICD-10-CM | POA: Diagnosis not present

## 2016-03-17 DIAGNOSIS — D509 Iron deficiency anemia, unspecified: Secondary | ICD-10-CM | POA: Diagnosis not present

## 2016-03-20 DIAGNOSIS — D509 Iron deficiency anemia, unspecified: Secondary | ICD-10-CM | POA: Diagnosis not present

## 2016-03-20 DIAGNOSIS — D631 Anemia in chronic kidney disease: Secondary | ICD-10-CM | POA: Diagnosis not present

## 2016-03-20 DIAGNOSIS — N2581 Secondary hyperparathyroidism of renal origin: Secondary | ICD-10-CM | POA: Diagnosis not present

## 2016-03-20 DIAGNOSIS — N186 End stage renal disease: Secondary | ICD-10-CM | POA: Diagnosis not present

## 2016-03-23 DIAGNOSIS — D631 Anemia in chronic kidney disease: Secondary | ICD-10-CM | POA: Diagnosis not present

## 2016-03-23 DIAGNOSIS — N2581 Secondary hyperparathyroidism of renal origin: Secondary | ICD-10-CM | POA: Diagnosis not present

## 2016-03-23 DIAGNOSIS — N186 End stage renal disease: Secondary | ICD-10-CM | POA: Diagnosis not present

## 2016-03-23 DIAGNOSIS — D509 Iron deficiency anemia, unspecified: Secondary | ICD-10-CM | POA: Diagnosis not present

## 2016-03-25 DIAGNOSIS — N186 End stage renal disease: Secondary | ICD-10-CM | POA: Diagnosis not present

## 2016-03-25 DIAGNOSIS — I158 Other secondary hypertension: Secondary | ICD-10-CM | POA: Diagnosis not present

## 2016-03-25 DIAGNOSIS — D631 Anemia in chronic kidney disease: Secondary | ICD-10-CM | POA: Diagnosis not present

## 2016-03-25 DIAGNOSIS — N2581 Secondary hyperparathyroidism of renal origin: Secondary | ICD-10-CM | POA: Diagnosis not present

## 2016-03-25 DIAGNOSIS — Z992 Dependence on renal dialysis: Secondary | ICD-10-CM | POA: Diagnosis not present

## 2016-03-25 DIAGNOSIS — D509 Iron deficiency anemia, unspecified: Secondary | ICD-10-CM | POA: Diagnosis not present

## 2016-03-27 DIAGNOSIS — N186 End stage renal disease: Secondary | ICD-10-CM | POA: Diagnosis not present

## 2016-03-27 DIAGNOSIS — D509 Iron deficiency anemia, unspecified: Secondary | ICD-10-CM | POA: Diagnosis not present

## 2016-03-27 DIAGNOSIS — N2581 Secondary hyperparathyroidism of renal origin: Secondary | ICD-10-CM | POA: Diagnosis not present

## 2016-03-27 DIAGNOSIS — D631 Anemia in chronic kidney disease: Secondary | ICD-10-CM | POA: Diagnosis not present

## 2016-03-29 ENCOUNTER — Ambulatory Visit (INDEPENDENT_AMBULATORY_CARE_PROVIDER_SITE_OTHER): Payer: Medicare Other | Admitting: Neurology

## 2016-03-29 ENCOUNTER — Encounter: Payer: Self-pay | Admitting: Neurology

## 2016-03-29 VITALS — BP 130/78 | HR 101 | Ht 64.0 in | Wt 174.0 lb

## 2016-03-29 DIAGNOSIS — G44219 Episodic tension-type headache, not intractable: Secondary | ICD-10-CM | POA: Diagnosis not present

## 2016-03-29 NOTE — Patient Instructions (Signed)
I think you have tension type headaches.  I don't thing there is anything serious.  I wouldn't add any medication.  I would continue taking Tylenol when if you get it.  Contact us if headaches get worse and you need to follow up

## 2016-03-29 NOTE — Progress Notes (Signed)
NEUROLOGY CONSULTATION NOTE  Vanessa Alvarez MRN: [id number] DOB: 1985/11/08  Referring provider: Dr. Lajuana Ripple Primary care provider: Dr. Lajuana Ripple  Reason for consult:  headache  HISTORY OF PRESENT ILLNESS: Vanessa Alvarez is a 30 year old female with ESRD on HD (T/R/S), mood disorder, mental disorder, hypertension, hypothyroidism who presents for headache and numbness.  History supplemented by PCP note and her mother, who accompanies her.  Onset:  November 2016 (after her catheter placement for HD). Intensity:  Mild but sometimes wose Location:  Bi-temporal Quality:  "pulling" sensation (non-throbbing) Aura:  No Duration:  2 hours Frequency:  2 to 3 days a month but has not had it recently Associated symptoms:  No nausea, photophobia or phonophobia.  Sometimes sees spots and has noted a numb sensation on the side of her face.  Past NSAIDS:  no Past analgesics:  no Past abortive triptans:  sumatriptan 25mg  (ineffective) Past muscle relaxants:  no Past anti-emetic:  no  Current NSAIDS:  No  Current analgesics:  Tylenol Current triptans:  no Current anti-emetic:  no Current muscle relaxants:  no Current anti-anxiolytic:  BuSpar Current sleep aide:  no Current Antihypertensive medications:   Current Antidepressant medications:  sertraline 100mg  Current Anticonvulsant medications:  lamotrigine 150mg  Other therapy:  no Other medication:  Abilify  Labs from August include CBC with WBC 6.2, HGB 11.6, HCT 34.2 and PLT 269; BMP showed Na 139, K 4.2, glucose 78, BUN 33 and Cr 8.18.  PAST MEDICAL HISTORY: Past Medical History:  Diagnosis Date  . Anemia    Iron Def  . Anxiety   . Autism spectrum   . Behavioral problems   . Depression   . End stage renal disease ([place])   . GERD (gastroesophageal reflux disease)   . Hyperlipidemia   . Hypertension   . HYPOTHYROIDISM 03/12/2009   Qualifier: Diagnosis of  By: Earley Favor MD, Cat    . MENTAL RETARDATION 06/23/2006   Qualifier:  Diagnosis of  By: Eusebio Friendly    . Mood disorder [facility])    Sees Dr Silvio Pate, who prescribes meds  . Shortness of breath dyspnea    with exertion    PAST SURGICAL HISTORY: Past Surgical History:  Procedure Laterality Date  . AV FISTULA PLACEMENT Left 03/17/2015   Procedure: CREATION OF LEFT BRACHIOCEPHALIC ARTERIOVENOUS (AV) FISTULA ;  Surgeon: Conrad [place], MD;  Location: [place];  Service: Vascular;  Laterality: Left;  . CHOLECYSTECTOMY  11/2004  . INSERTION OF DIALYSIS CATHETER Right 03/17/2015   Procedure: INSERTION OF DIALYSIS CATHETER RIGHT INTERNAL JUIGULAR PLACEMENT;  Surgeon: Conrad [place], MD;  Location: [place];  Service: Vascular;  Laterality: Right;    MEDICATIONS: Current Outpatient Prescriptions on File Prior to Visit  Medication Sig Dispense Refill  . ARIPiprazole (ABILIFY) 5 MG tablet Take 5 mg by mouth at bedtime.    . B Complex-C-Folic Acid (DIALYVITE 182 PO) Take by mouth.    . cinacalcet (SENSIPAR) 30 MG tablet Take 30 mg by mouth daily with supper.    . ferrous sulfate 325 (65 FE) MG tablet Take 1 tablet (325 mg total) by mouth 2 (two) times daily. 180 tablet 3  . hydrOXYzine (ATARAX/VISTARIL) 25 MG tablet Take 1 tablet (25 mg total) by mouth every 6 (six) hours as needed for anxiety, itching or nausea. 30 tablet 0  . lamoTRIgine (LAMICTAL) 150 MG tablet Take 150 mg by mouth at bedtime.  2  . levothyroxine (SYNTHROID, LEVOTHROID) 112 MCG tablet TAKE 1 TABLET DAILY BEFORE  BREAKFAST. 30 tablet 12  . omeprazole (PRILOSEC) 20 MG capsule TAKE 1 CAPSULE (20 MG TOTAL) BY MOUTH DAILY. 30 capsule 3  . polyethylene glycol powder (GLYCOLAX/MIRALAX) powder Take 17 g by mouth daily. Reported on 05/26/2015    . pravastatin (PRAVACHOL) 20 MG tablet Take 20 mg by mouth at bedtime.    . sertraline (ZOLOFT) 100 MG tablet Take 200 mg by mouth daily.     . sevelamer carbonate (RENVELA) 800 MG tablet Take 1,600 mg by mouth 3 (three) times daily with meals.    . SUMAtriptan (IMITREX) 25 MG  tablet Take 1 tablet (25 mg total) by mouth every 2 (two) hours as needed for migraine. May repeat in 2 hours ONCE if headache persists or recurs. (Patient not taking: Reported on 03/29/2016) 10 tablet 1   No current facility-administered medications on file prior to visit.     ALLERGIES: No Known Allergies  FAMILY HISTORY: Family History  Problem Relation Age of Onset  . Hypertension Mother   . Diabetes Maternal Grandmother   . Hypertension Maternal Grandmother   . Heart disease Maternal Grandmother   . Bladder Cancer Maternal Grandmother   . Diabetes Maternal Grandfather     SOCIAL HISTORY: Social History   Social History  . Marital status: Single    Spouse name: N/A  . Number of children: 0  . Years of education: N/A   Occupational History  . disabled    Social History Main Topics  . Smoking status: Never Smoker  . Smokeless tobacco: Never Used  . Alcohol use No  . Drug use: No  . Sexual activity: No   Other Topics Concern  . Not on file   Social History Narrative   ** Merged History Encounter **       Lives with mother.   Very sedentary livestyle.   No etoh, tobacco, drugs, intercourse.      MR.     REVIEW OF SYSTEMS: Constitutional: No fevers, chills, or sweats, no generalized fatigue, change in appetite Eyes: No visual changes, double vision, eye pain Ear, nose and throat: No hearing loss, ear pain, nasal congestion, sore throat Cardiovascular: No chest pain, palpitations Respiratory:  No shortness of breath at rest or with exertion, wheezes GastrointestinaI: No nausea, vomiting, diarrhea, abdominal pain, fecal incontinence Genitourinary:  No dysuria, urinary retention or frequency Musculoskeletal:  No neck pain, back pain Integumentary: No rash, pruritus, skin lesions Neurological: as above Psychiatric: No depression, insomnia, anxiety Endocrine: No palpitations, fatigue, diaphoresis, mood swings, change in appetite, change in weight, increased  thirst Hematologic/Lymphatic:  No purpura, petechiae. Allergic/Immunologic: no itchy/runny eyes, nasal congestion, recent allergic reactions, rashes  PHYSICAL EXAM: Vitals:   03/29/16 0747  BP: 130/78  Pulse: (!) 101   General: No acute distress.  Patient appears well-groomed.  Head:  Normocephalic/atraumatic Eyes:  fundi examined but not visualized Neck: supple, no paraspinal tenderness, full range of motion Back: No paraspinal tenderness Heart: regular rate and rhythm Lungs: Clear to auscultation bilaterally. Vascular: No carotid bruits. Neurological Exam: Mental status: alert and oriented to person, place, and time, recent and remote memory intact, fund of knowledge fair, attention and concentration intact, speech fluent and not dysarthric, language intact. Cranial nerves: CN I: not tested CN II: pupils equal, round and reactive to light, visual fields intact CN III, IV, VI:  Left exotropia on primary gaze, full range of motion, no nystagmus, no ptosis CN V: facial sensation intact CN VII: upper and lower face symmetric CN VIII:  hearing intact CN IX, X: gag intact, uvula midline CN XI: sternocleidomastoid and trapezius muscles intact CN XII: tongue midline Bulk & Tone: normal, no fasciculations. Motor:  5/5 throughout  Sensation: temperature and vibration sensation intact. Deep Tendon Reflexes:  2+ throughout, toes downgoing.  Finger to nose testing:  Without dysmetria.  Heel to shin:  Without dysmetria.  Gait:  Normal station and stride.  Able to turn and tandem walk. Romberg negative.  IMPRESSION: Episodic tension type headache.  There is nothing concerning on neurologic exam.  PLAN: Since they are infrequent, and taking into account her comorbidities and other medications, I would not start a preventative medication.  Continue Tylenol as needed (since it seems to help).  Follow up as needed.  Thank you for allowing me to take part in the care of this patient.  Metta Clines, DO  CC:  Ronnie Doss, DO

## 2016-03-30 DIAGNOSIS — Z992 Dependence on renal dialysis: Secondary | ICD-10-CM | POA: Diagnosis not present

## 2016-03-30 DIAGNOSIS — N186 End stage renal disease: Secondary | ICD-10-CM | POA: Diagnosis not present

## 2016-03-30 DIAGNOSIS — Z9115 Patient's noncompliance with renal dialysis: Secondary | ICD-10-CM | POA: Diagnosis not present

## 2016-03-30 DIAGNOSIS — F22 Delusional disorders: Secondary | ICD-10-CM | POA: Diagnosis not present

## 2016-03-31 DIAGNOSIS — D509 Iron deficiency anemia, unspecified: Secondary | ICD-10-CM | POA: Diagnosis not present

## 2016-03-31 DIAGNOSIS — D631 Anemia in chronic kidney disease: Secondary | ICD-10-CM | POA: Diagnosis not present

## 2016-03-31 DIAGNOSIS — N2581 Secondary hyperparathyroidism of renal origin: Secondary | ICD-10-CM | POA: Diagnosis not present

## 2016-03-31 DIAGNOSIS — N186 End stage renal disease: Secondary | ICD-10-CM | POA: Diagnosis not present

## 2016-04-01 DIAGNOSIS — D631 Anemia in chronic kidney disease: Secondary | ICD-10-CM | POA: Diagnosis not present

## 2016-04-01 DIAGNOSIS — N2581 Secondary hyperparathyroidism of renal origin: Secondary | ICD-10-CM | POA: Diagnosis not present

## 2016-04-01 DIAGNOSIS — N186 End stage renal disease: Secondary | ICD-10-CM | POA: Diagnosis not present

## 2016-04-01 DIAGNOSIS — D509 Iron deficiency anemia, unspecified: Secondary | ICD-10-CM | POA: Diagnosis not present

## 2016-04-02 DIAGNOSIS — F71 Moderate intellectual disabilities: Secondary | ICD-10-CM | POA: Diagnosis not present

## 2016-04-02 DIAGNOSIS — F329 Major depressive disorder, single episode, unspecified: Secondary | ICD-10-CM | POA: Diagnosis not present

## 2016-04-03 DIAGNOSIS — D631 Anemia in chronic kidney disease: Secondary | ICD-10-CM | POA: Diagnosis not present

## 2016-04-03 DIAGNOSIS — N2581 Secondary hyperparathyroidism of renal origin: Secondary | ICD-10-CM | POA: Diagnosis not present

## 2016-04-03 DIAGNOSIS — N186 End stage renal disease: Secondary | ICD-10-CM | POA: Diagnosis not present

## 2016-04-03 DIAGNOSIS — D509 Iron deficiency anemia, unspecified: Secondary | ICD-10-CM | POA: Diagnosis not present

## 2016-04-06 DIAGNOSIS — D509 Iron deficiency anemia, unspecified: Secondary | ICD-10-CM | POA: Diagnosis not present

## 2016-04-06 DIAGNOSIS — N2581 Secondary hyperparathyroidism of renal origin: Secondary | ICD-10-CM | POA: Diagnosis not present

## 2016-04-06 DIAGNOSIS — N186 End stage renal disease: Secondary | ICD-10-CM | POA: Diagnosis not present

## 2016-04-06 DIAGNOSIS — D631 Anemia in chronic kidney disease: Secondary | ICD-10-CM | POA: Diagnosis not present

## 2016-04-08 DIAGNOSIS — N186 End stage renal disease: Secondary | ICD-10-CM | POA: Diagnosis not present

## 2016-04-08 DIAGNOSIS — D509 Iron deficiency anemia, unspecified: Secondary | ICD-10-CM | POA: Diagnosis not present

## 2016-04-08 DIAGNOSIS — N2581 Secondary hyperparathyroidism of renal origin: Secondary | ICD-10-CM | POA: Diagnosis not present

## 2016-04-08 DIAGNOSIS — D631 Anemia in chronic kidney disease: Secondary | ICD-10-CM | POA: Diagnosis not present

## 2016-04-10 DIAGNOSIS — N186 End stage renal disease: Secondary | ICD-10-CM | POA: Diagnosis not present

## 2016-04-10 DIAGNOSIS — D631 Anemia in chronic kidney disease: Secondary | ICD-10-CM | POA: Diagnosis not present

## 2016-04-10 DIAGNOSIS — D509 Iron deficiency anemia, unspecified: Secondary | ICD-10-CM | POA: Diagnosis not present

## 2016-04-10 DIAGNOSIS — N2581 Secondary hyperparathyroidism of renal origin: Secondary | ICD-10-CM | POA: Diagnosis not present

## 2016-04-13 DIAGNOSIS — D631 Anemia in chronic kidney disease: Secondary | ICD-10-CM | POA: Diagnosis not present

## 2016-04-13 DIAGNOSIS — N186 End stage renal disease: Secondary | ICD-10-CM | POA: Diagnosis not present

## 2016-04-13 DIAGNOSIS — N2581 Secondary hyperparathyroidism of renal origin: Secondary | ICD-10-CM | POA: Diagnosis not present

## 2016-04-13 DIAGNOSIS — D509 Iron deficiency anemia, unspecified: Secondary | ICD-10-CM | POA: Diagnosis not present

## 2016-04-15 DIAGNOSIS — N186 End stage renal disease: Secondary | ICD-10-CM | POA: Diagnosis not present

## 2016-04-15 DIAGNOSIS — D631 Anemia in chronic kidney disease: Secondary | ICD-10-CM | POA: Diagnosis not present

## 2016-04-15 DIAGNOSIS — D509 Iron deficiency anemia, unspecified: Secondary | ICD-10-CM | POA: Diagnosis not present

## 2016-04-15 DIAGNOSIS — N2581 Secondary hyperparathyroidism of renal origin: Secondary | ICD-10-CM | POA: Diagnosis not present

## 2016-04-17 DIAGNOSIS — D631 Anemia in chronic kidney disease: Secondary | ICD-10-CM | POA: Diagnosis not present

## 2016-04-17 DIAGNOSIS — N2581 Secondary hyperparathyroidism of renal origin: Secondary | ICD-10-CM | POA: Diagnosis not present

## 2016-04-17 DIAGNOSIS — N186 End stage renal disease: Secondary | ICD-10-CM | POA: Diagnosis not present

## 2016-04-17 DIAGNOSIS — D509 Iron deficiency anemia, unspecified: Secondary | ICD-10-CM | POA: Diagnosis not present

## 2016-04-20 DIAGNOSIS — N186 End stage renal disease: Secondary | ICD-10-CM | POA: Diagnosis not present

## 2016-04-20 DIAGNOSIS — N2581 Secondary hyperparathyroidism of renal origin: Secondary | ICD-10-CM | POA: Diagnosis not present

## 2016-04-20 DIAGNOSIS — D631 Anemia in chronic kidney disease: Secondary | ICD-10-CM | POA: Diagnosis not present

## 2016-04-20 DIAGNOSIS — D509 Iron deficiency anemia, unspecified: Secondary | ICD-10-CM | POA: Diagnosis not present

## 2016-04-22 DIAGNOSIS — N186 End stage renal disease: Secondary | ICD-10-CM | POA: Diagnosis not present

## 2016-04-22 DIAGNOSIS — N2581 Secondary hyperparathyroidism of renal origin: Secondary | ICD-10-CM | POA: Diagnosis not present

## 2016-04-22 DIAGNOSIS — D509 Iron deficiency anemia, unspecified: Secondary | ICD-10-CM | POA: Diagnosis not present

## 2016-04-22 DIAGNOSIS — D631 Anemia in chronic kidney disease: Secondary | ICD-10-CM | POA: Diagnosis not present

## 2016-04-24 DIAGNOSIS — N186 End stage renal disease: Secondary | ICD-10-CM | POA: Diagnosis not present

## 2016-04-24 DIAGNOSIS — N2581 Secondary hyperparathyroidism of renal origin: Secondary | ICD-10-CM | POA: Diagnosis not present

## 2016-04-24 DIAGNOSIS — D509 Iron deficiency anemia, unspecified: Secondary | ICD-10-CM | POA: Diagnosis not present

## 2016-04-24 DIAGNOSIS — D631 Anemia in chronic kidney disease: Secondary | ICD-10-CM | POA: Diagnosis not present

## 2016-04-25 DIAGNOSIS — N186 End stage renal disease: Secondary | ICD-10-CM | POA: Diagnosis not present

## 2016-04-25 DIAGNOSIS — I158 Other secondary hypertension: Secondary | ICD-10-CM | POA: Diagnosis not present

## 2016-04-25 DIAGNOSIS — Z992 Dependence on renal dialysis: Secondary | ICD-10-CM | POA: Diagnosis not present

## 2016-04-27 DIAGNOSIS — N186 End stage renal disease: Secondary | ICD-10-CM | POA: Diagnosis not present

## 2016-04-27 DIAGNOSIS — N2581 Secondary hyperparathyroidism of renal origin: Secondary | ICD-10-CM | POA: Diagnosis not present

## 2016-04-27 DIAGNOSIS — D509 Iron deficiency anemia, unspecified: Secondary | ICD-10-CM | POA: Diagnosis not present

## 2016-04-27 DIAGNOSIS — D631 Anemia in chronic kidney disease: Secondary | ICD-10-CM | POA: Diagnosis not present

## 2016-04-29 DIAGNOSIS — D631 Anemia in chronic kidney disease: Secondary | ICD-10-CM | POA: Diagnosis not present

## 2016-04-29 DIAGNOSIS — N2581 Secondary hyperparathyroidism of renal origin: Secondary | ICD-10-CM | POA: Diagnosis not present

## 2016-04-29 DIAGNOSIS — N186 End stage renal disease: Secondary | ICD-10-CM | POA: Diagnosis not present

## 2016-04-29 DIAGNOSIS — D509 Iron deficiency anemia, unspecified: Secondary | ICD-10-CM | POA: Diagnosis not present

## 2016-05-01 DIAGNOSIS — D631 Anemia in chronic kidney disease: Secondary | ICD-10-CM | POA: Diagnosis not present

## 2016-05-01 DIAGNOSIS — N2581 Secondary hyperparathyroidism of renal origin: Secondary | ICD-10-CM | POA: Diagnosis not present

## 2016-05-01 DIAGNOSIS — N186 End stage renal disease: Secondary | ICD-10-CM | POA: Diagnosis not present

## 2016-05-01 DIAGNOSIS — D509 Iron deficiency anemia, unspecified: Secondary | ICD-10-CM | POA: Diagnosis not present

## 2016-05-03 ENCOUNTER — Encounter: Payer: Self-pay | Admitting: Family Medicine

## 2016-05-03 ENCOUNTER — Ambulatory Visit (INDEPENDENT_AMBULATORY_CARE_PROVIDER_SITE_OTHER): Payer: Medicare Other | Admitting: Family Medicine

## 2016-05-03 VITALS — BP 142/90 | HR 101 | Temp 98.5°F | Ht 64.0 in | Wt 175.8 lb

## 2016-05-03 DIAGNOSIS — F71 Moderate intellectual disabilities: Secondary | ICD-10-CM | POA: Diagnosis not present

## 2016-05-03 DIAGNOSIS — Z124 Encounter for screening for malignant neoplasm of cervix: Secondary | ICD-10-CM

## 2016-05-03 DIAGNOSIS — E669 Obesity, unspecified: Secondary | ICD-10-CM

## 2016-05-03 DIAGNOSIS — E039 Hypothyroidism, unspecified: Secondary | ICD-10-CM

## 2016-05-03 DIAGNOSIS — I10 Essential (primary) hypertension: Secondary | ICD-10-CM | POA: Diagnosis present

## 2016-05-03 DIAGNOSIS — R7301 Impaired fasting glucose: Secondary | ICD-10-CM | POA: Diagnosis not present

## 2016-05-03 NOTE — Progress Notes (Signed)
    Subjective: CC: HLD, Hypothyroidism, transplant HPI: Vanessa Alvarez is a 31 y.o. female presenting to clinic today for:  1. HLD Patient's mother reports she was placed on pravastatin some time ago.  She notes she ran out of medication last fall and has not been on it since.  She has no h/o DM2.  Diet is fair.  She has lost weight since last year and hope that her labs have improved.  2. Hypothyroidism Patient reports compliance with Synthroid as directed.  Denies diarrhea, constipation, fatigue.    3. Kidney transplant Patient is gearing up for kidney transplant consideration.  She is currently undergoing HD 3x per week with [facility], Dr Marval Regal.  She reports compliance with this.  Her mother notes that she will require a pap smear prior to transplant.  She asks that patient be seen by a GYN for this.  Patient's last menstrual period was 04/21/2016 (approximate).  Never sexually active.  Not on contraception.  Social Hx reviewed: non smoker. MedHx, medications and allergies reviewed.  Please see EMR. ROS: Per HPI  Objective: Office vital signs reviewed. BP (!) 142/90   Pulse (!) 101   Temp 98.5 F (36.9 C) (Oral)   Ht 5\' 4"  (1.626 m)   Wt 175 lb 12.8 oz (79.7 kg)   LMP 04/21/2016 (Approximate)   SpO2 99%   BMI 30.18 kg/m   Physical Examination:  General: Awake, alert, well nourished, No acute distress HEENT: Normal    Neck: No masses palpated. No lymphadenopathy, no thyromegaly/ masses Cardio: regular rate and rhythm, S1S2 heard, no murmurs appreciated Pulm: clear to auscultation bilaterally, no wheezes, rhonchi or rales; normal work of breathing on room air  Assessment/ Plan: 31 y.o. female   1. Essential hypertension.  BP not at goal.  She is not currently on antihypertensives.   - Return to clinic in 1 week for BP check.  If persistently elevated, will consider starting Norvasc 5mg  daily. - Lipid panel; Future - Hemoglobin A1c; Future  2. Hypothyroidism,  unspecified type - Continue Synthroid - TSH; Future  3. Obesity (BMI 30.0-34.9) - Lipid panel; Future - Hemoglobin A1c; Future  4. Impaired fasting glucose - Lipid panel; Future - Hemoglobin A1c; Future  5. Screening for cervical cancer - Reviewed with mother that cervical cancer screening can be performed here.  However, she prefers that patient be evaluated by GYN. - Ambulatory referral to Gynecology  [name], DO PGY-3, [facility]

## 2016-05-03 NOTE — Patient Instructions (Signed)
Your referral to GYN has been placed for your pap smear.  I will contact you will the results of your labs.  If anything is abnormal, I will call you.  Otherwise, expect a copy to be mailed to you.

## 2016-05-04 DIAGNOSIS — D631 Anemia in chronic kidney disease: Secondary | ICD-10-CM | POA: Diagnosis not present

## 2016-05-04 DIAGNOSIS — D509 Iron deficiency anemia, unspecified: Secondary | ICD-10-CM | POA: Diagnosis not present

## 2016-05-04 DIAGNOSIS — N2581 Secondary hyperparathyroidism of renal origin: Secondary | ICD-10-CM | POA: Diagnosis not present

## 2016-05-04 DIAGNOSIS — N186 End stage renal disease: Secondary | ICD-10-CM | POA: Diagnosis not present

## 2016-05-06 DIAGNOSIS — N2581 Secondary hyperparathyroidism of renal origin: Secondary | ICD-10-CM | POA: Diagnosis not present

## 2016-05-06 DIAGNOSIS — D631 Anemia in chronic kidney disease: Secondary | ICD-10-CM | POA: Diagnosis not present

## 2016-05-06 DIAGNOSIS — D509 Iron deficiency anemia, unspecified: Secondary | ICD-10-CM | POA: Diagnosis not present

## 2016-05-06 DIAGNOSIS — N186 End stage renal disease: Secondary | ICD-10-CM | POA: Diagnosis not present

## 2016-05-08 DIAGNOSIS — D631 Anemia in chronic kidney disease: Secondary | ICD-10-CM | POA: Diagnosis not present

## 2016-05-08 DIAGNOSIS — N2581 Secondary hyperparathyroidism of renal origin: Secondary | ICD-10-CM | POA: Diagnosis not present

## 2016-05-08 DIAGNOSIS — D509 Iron deficiency anemia, unspecified: Secondary | ICD-10-CM | POA: Diagnosis not present

## 2016-05-08 DIAGNOSIS — N186 End stage renal disease: Secondary | ICD-10-CM | POA: Diagnosis not present

## 2016-05-11 DIAGNOSIS — D631 Anemia in chronic kidney disease: Secondary | ICD-10-CM | POA: Diagnosis not present

## 2016-05-11 DIAGNOSIS — D509 Iron deficiency anemia, unspecified: Secondary | ICD-10-CM | POA: Diagnosis not present

## 2016-05-11 DIAGNOSIS — N186 End stage renal disease: Secondary | ICD-10-CM | POA: Diagnosis not present

## 2016-05-11 DIAGNOSIS — N2581 Secondary hyperparathyroidism of renal origin: Secondary | ICD-10-CM | POA: Diagnosis not present

## 2016-05-13 DIAGNOSIS — D509 Iron deficiency anemia, unspecified: Secondary | ICD-10-CM | POA: Diagnosis not present

## 2016-05-13 DIAGNOSIS — D631 Anemia in chronic kidney disease: Secondary | ICD-10-CM | POA: Diagnosis not present

## 2016-05-13 DIAGNOSIS — N186 End stage renal disease: Secondary | ICD-10-CM | POA: Diagnosis not present

## 2016-05-13 DIAGNOSIS — N2581 Secondary hyperparathyroidism of renal origin: Secondary | ICD-10-CM | POA: Diagnosis not present

## 2016-05-15 DIAGNOSIS — D631 Anemia in chronic kidney disease: Secondary | ICD-10-CM | POA: Diagnosis not present

## 2016-05-15 DIAGNOSIS — N2581 Secondary hyperparathyroidism of renal origin: Secondary | ICD-10-CM | POA: Diagnosis not present

## 2016-05-15 DIAGNOSIS — N186 End stage renal disease: Secondary | ICD-10-CM | POA: Diagnosis not present

## 2016-05-15 DIAGNOSIS — D509 Iron deficiency anemia, unspecified: Secondary | ICD-10-CM | POA: Diagnosis not present

## 2016-05-17 DIAGNOSIS — Z992 Dependence on renal dialysis: Secondary | ICD-10-CM | POA: Diagnosis not present

## 2016-05-17 DIAGNOSIS — N186 End stage renal disease: Secondary | ICD-10-CM | POA: Diagnosis not present

## 2016-05-17 DIAGNOSIS — I871 Compression of vein: Secondary | ICD-10-CM | POA: Diagnosis not present

## 2016-05-17 DIAGNOSIS — T82858A Stenosis of vascular prosthetic devices, implants and grafts, initial encounter: Secondary | ICD-10-CM | POA: Diagnosis not present

## 2016-05-18 DIAGNOSIS — N2581 Secondary hyperparathyroidism of renal origin: Secondary | ICD-10-CM | POA: Diagnosis not present

## 2016-05-18 DIAGNOSIS — D631 Anemia in chronic kidney disease: Secondary | ICD-10-CM | POA: Diagnosis not present

## 2016-05-18 DIAGNOSIS — N186 End stage renal disease: Secondary | ICD-10-CM | POA: Diagnosis not present

## 2016-05-18 DIAGNOSIS — D509 Iron deficiency anemia, unspecified: Secondary | ICD-10-CM | POA: Diagnosis not present

## 2016-05-20 DIAGNOSIS — N186 End stage renal disease: Secondary | ICD-10-CM | POA: Diagnosis not present

## 2016-05-20 DIAGNOSIS — D631 Anemia in chronic kidney disease: Secondary | ICD-10-CM | POA: Diagnosis not present

## 2016-05-20 DIAGNOSIS — D509 Iron deficiency anemia, unspecified: Secondary | ICD-10-CM | POA: Diagnosis not present

## 2016-05-20 DIAGNOSIS — N2581 Secondary hyperparathyroidism of renal origin: Secondary | ICD-10-CM | POA: Diagnosis not present

## 2016-05-21 DIAGNOSIS — F329 Major depressive disorder, single episode, unspecified: Secondary | ICD-10-CM | POA: Diagnosis not present

## 2016-05-21 DIAGNOSIS — F71 Moderate intellectual disabilities: Secondary | ICD-10-CM | POA: Diagnosis not present

## 2016-05-22 DIAGNOSIS — D509 Iron deficiency anemia, unspecified: Secondary | ICD-10-CM | POA: Diagnosis not present

## 2016-05-22 DIAGNOSIS — N186 End stage renal disease: Secondary | ICD-10-CM | POA: Diagnosis not present

## 2016-05-22 DIAGNOSIS — D631 Anemia in chronic kidney disease: Secondary | ICD-10-CM | POA: Diagnosis not present

## 2016-05-22 DIAGNOSIS — N2581 Secondary hyperparathyroidism of renal origin: Secondary | ICD-10-CM | POA: Diagnosis not present

## 2016-05-25 DIAGNOSIS — D631 Anemia in chronic kidney disease: Secondary | ICD-10-CM | POA: Diagnosis not present

## 2016-05-25 DIAGNOSIS — N186 End stage renal disease: Secondary | ICD-10-CM | POA: Diagnosis not present

## 2016-05-25 DIAGNOSIS — D509 Iron deficiency anemia, unspecified: Secondary | ICD-10-CM | POA: Diagnosis not present

## 2016-05-25 DIAGNOSIS — N2581 Secondary hyperparathyroidism of renal origin: Secondary | ICD-10-CM | POA: Diagnosis not present

## 2016-05-26 DIAGNOSIS — I158 Other secondary hypertension: Secondary | ICD-10-CM | POA: Diagnosis not present

## 2016-05-26 DIAGNOSIS — Z992 Dependence on renal dialysis: Secondary | ICD-10-CM | POA: Diagnosis not present

## 2016-05-26 DIAGNOSIS — N186 End stage renal disease: Secondary | ICD-10-CM | POA: Diagnosis not present

## 2016-05-27 DIAGNOSIS — N2581 Secondary hyperparathyroidism of renal origin: Secondary | ICD-10-CM | POA: Diagnosis not present

## 2016-05-27 DIAGNOSIS — D631 Anemia in chronic kidney disease: Secondary | ICD-10-CM | POA: Diagnosis not present

## 2016-05-27 DIAGNOSIS — N186 End stage renal disease: Secondary | ICD-10-CM | POA: Diagnosis not present

## 2016-05-28 ENCOUNTER — Other Ambulatory Visit (INDEPENDENT_AMBULATORY_CARE_PROVIDER_SITE_OTHER): Payer: Medicare Other

## 2016-05-28 DIAGNOSIS — R7301 Impaired fasting glucose: Secondary | ICD-10-CM | POA: Diagnosis not present

## 2016-05-28 DIAGNOSIS — E039 Hypothyroidism, unspecified: Secondary | ICD-10-CM | POA: Diagnosis not present

## 2016-05-28 DIAGNOSIS — E669 Obesity, unspecified: Secondary | ICD-10-CM

## 2016-05-28 DIAGNOSIS — I10 Essential (primary) hypertension: Secondary | ICD-10-CM | POA: Diagnosis not present

## 2016-05-28 LAB — LIPID PANEL
CHOL/HDL RATIO: 2.9 ratio (ref <5.0)
Cholesterol: 176 mg/dL (ref <200)
HDL: 60 mg/dL (ref >50)
LDL Cholesterol: 95 mg/dL (ref <100)
Triglycerides: 106 mg/dL (ref <150)
VLDL: 21 mg/dL (ref <30)

## 2016-05-28 LAB — POCT GLYCOSYLATED HEMOGLOBIN (HGB A1C): HEMOGLOBIN A1C: 4.5

## 2016-05-28 LAB — TSH: TSH: 2.27 m[IU]/L

## 2016-05-29 DIAGNOSIS — N186 End stage renal disease: Secondary | ICD-10-CM | POA: Diagnosis not present

## 2016-05-29 DIAGNOSIS — D631 Anemia in chronic kidney disease: Secondary | ICD-10-CM | POA: Diagnosis not present

## 2016-05-29 DIAGNOSIS — N2581 Secondary hyperparathyroidism of renal origin: Secondary | ICD-10-CM | POA: Diagnosis not present

## 2016-05-30 ENCOUNTER — Other Ambulatory Visit: Payer: Self-pay | Admitting: Family Medicine

## 2016-06-01 DIAGNOSIS — N2581 Secondary hyperparathyroidism of renal origin: Secondary | ICD-10-CM | POA: Diagnosis not present

## 2016-06-01 DIAGNOSIS — D631 Anemia in chronic kidney disease: Secondary | ICD-10-CM | POA: Diagnosis not present

## 2016-06-01 DIAGNOSIS — N186 End stage renal disease: Secondary | ICD-10-CM | POA: Diagnosis not present

## 2016-06-03 DIAGNOSIS — D631 Anemia in chronic kidney disease: Secondary | ICD-10-CM | POA: Diagnosis not present

## 2016-06-03 DIAGNOSIS — N2581 Secondary hyperparathyroidism of renal origin: Secondary | ICD-10-CM | POA: Diagnosis not present

## 2016-06-03 DIAGNOSIS — N186 End stage renal disease: Secondary | ICD-10-CM | POA: Diagnosis not present

## 2016-06-04 DIAGNOSIS — H52223 Regular astigmatism, bilateral: Secondary | ICD-10-CM | POA: Diagnosis not present

## 2016-06-04 DIAGNOSIS — H5213 Myopia, bilateral: Secondary | ICD-10-CM | POA: Diagnosis not present

## 2016-06-04 DIAGNOSIS — H5501 Congenital nystagmus: Secondary | ICD-10-CM | POA: Diagnosis not present

## 2016-06-04 DIAGNOSIS — H53003 Unspecified amblyopia, bilateral: Secondary | ICD-10-CM | POA: Diagnosis not present

## 2016-06-05 DIAGNOSIS — D631 Anemia in chronic kidney disease: Secondary | ICD-10-CM | POA: Diagnosis not present

## 2016-06-05 DIAGNOSIS — N186 End stage renal disease: Secondary | ICD-10-CM | POA: Diagnosis not present

## 2016-06-05 DIAGNOSIS — N2581 Secondary hyperparathyroidism of renal origin: Secondary | ICD-10-CM | POA: Diagnosis not present

## 2016-06-08 DIAGNOSIS — N2581 Secondary hyperparathyroidism of renal origin: Secondary | ICD-10-CM | POA: Diagnosis not present

## 2016-06-08 DIAGNOSIS — D631 Anemia in chronic kidney disease: Secondary | ICD-10-CM | POA: Diagnosis not present

## 2016-06-08 DIAGNOSIS — N186 End stage renal disease: Secondary | ICD-10-CM | POA: Diagnosis not present

## 2016-06-10 DIAGNOSIS — D631 Anemia in chronic kidney disease: Secondary | ICD-10-CM | POA: Diagnosis not present

## 2016-06-10 DIAGNOSIS — N186 End stage renal disease: Secondary | ICD-10-CM | POA: Diagnosis not present

## 2016-06-10 DIAGNOSIS — N2581 Secondary hyperparathyroidism of renal origin: Secondary | ICD-10-CM | POA: Diagnosis not present

## 2016-06-12 DIAGNOSIS — N2581 Secondary hyperparathyroidism of renal origin: Secondary | ICD-10-CM | POA: Diagnosis not present

## 2016-06-12 DIAGNOSIS — N186 End stage renal disease: Secondary | ICD-10-CM | POA: Diagnosis not present

## 2016-06-12 DIAGNOSIS — D631 Anemia in chronic kidney disease: Secondary | ICD-10-CM | POA: Diagnosis not present

## 2016-06-14 ENCOUNTER — Telehealth: Payer: Self-pay

## 2016-06-14 NOTE — Telephone Encounter (Signed)
Please inform patient her thyroid function test is normal and her cholesterol was normal.

## 2016-06-14 NOTE — Telephone Encounter (Signed)
Pt would like test results. Please call pt on [phone]. Ottis Stain, CMA

## 2016-06-15 ENCOUNTER — Encounter (HOSPITAL_COMMUNITY): Payer: Self-pay | Admitting: Emergency Medicine

## 2016-06-15 ENCOUNTER — Emergency Department (HOSPITAL_COMMUNITY): Payer: Medicare Other

## 2016-06-15 ENCOUNTER — Emergency Department (HOSPITAL_COMMUNITY)
Admission: EM | Admit: 2016-06-15 | Discharge: 2016-06-15 | Disposition: A | Discharge status: Home / Self Care | Payer: Medicare Other | Attending: Emergency Medicine | Admitting: Emergency Medicine

## 2016-06-15 DIAGNOSIS — N186 End stage renal disease: Secondary | ICD-10-CM | POA: Insufficient documentation

## 2016-06-15 DIAGNOSIS — R509 Fever, unspecified: Secondary | ICD-10-CM | POA: Diagnosis present

## 2016-06-15 DIAGNOSIS — Z992 Dependence on renal dialysis: Secondary | ICD-10-CM | POA: Insufficient documentation

## 2016-06-15 DIAGNOSIS — I120 Hypertensive chronic kidney disease with stage 5 chronic kidney disease or end stage renal disease: Secondary | ICD-10-CM | POA: Insufficient documentation

## 2016-06-15 DIAGNOSIS — Z79899 Other long term (current) drug therapy: Secondary | ICD-10-CM | POA: Insufficient documentation

## 2016-06-15 DIAGNOSIS — R05 Cough: Secondary | ICD-10-CM | POA: Diagnosis not present

## 2016-06-15 DIAGNOSIS — J111 Influenza due to unidentified influenza virus with other respiratory manifestations: Secondary | ICD-10-CM | POA: Diagnosis not present

## 2016-06-15 DIAGNOSIS — R000 Tachycardia, unspecified: Secondary | ICD-10-CM | POA: Diagnosis not present

## 2016-06-15 DIAGNOSIS — E039 Hypothyroidism, unspecified: Secondary | ICD-10-CM | POA: Insufficient documentation

## 2016-06-15 DIAGNOSIS — R079 Chest pain, unspecified: Secondary | ICD-10-CM | POA: Diagnosis not present

## 2016-06-15 LAB — I-STAT CHEM 8, ED
BUN: 43 mg/dL — AB (ref 6–20)
CALCIUM ION: 1.13 mmol/L — AB (ref 1.15–1.40)
CHLORIDE: 102 mmol/L (ref 101–111)
Creatinine, Ser: 11 mg/dL — ABNORMAL HIGH (ref 0.44–1.00)
Glucose, Bld: 81 mg/dL (ref 65–99)
HEMATOCRIT: 32 % — AB (ref 36.0–46.0)
Hemoglobin: 10.9 g/dL — ABNORMAL LOW (ref 12.0–15.0)
Potassium: 5.4 mmol/L — ABNORMAL HIGH (ref 3.5–5.1)
SODIUM: 138 mmol/L (ref 135–145)
TCO2: 26 mmol/L (ref 0–100)

## 2016-06-15 LAB — RAPID STREP SCREEN (MED CTR MEBANE ONLY): STREPTOCOCCUS, GROUP A SCREEN (DIRECT): NEGATIVE

## 2016-06-15 LAB — INFLUENZA PANEL BY PCR (TYPE A & B)
INFLAPCR: POSITIVE — AB
INFLBPCR: NEGATIVE

## 2016-06-15 MED ORDER — OSELTAMIVIR PHOSPHATE 75 MG PO CAPS
75.0000 mg | ORAL_CAPSULE | Freq: Once | ORAL | Status: AC
  Administered 2016-06-15: 75 mg via ORAL
  Filled 2016-06-15: qty 1

## 2016-06-15 MED ORDER — ONDANSETRON HCL 4 MG PO TABS: 4.0000 mg | ORAL_TABLET | Freq: Three times a day (TID) | ORAL | 0 refills | Status: DC | PRN

## 2016-06-15 MED ORDER — SODIUM POLYSTYRENE SULFONATE 15 GM/60ML PO SUSP
30.0000 g | Freq: Once | ORAL | Status: AC
  Administered 2016-06-15: 30 g via ORAL
  Filled 2016-06-15: qty 120

## 2016-06-15 MED ORDER — OSELTAMIVIR PHOSPHATE 75 MG PO CAPS: 75.0000 mg | ORAL_CAPSULE | Freq: Two times a day (BID) | ORAL | 0 refills | Status: DC

## 2016-06-15 MED ORDER — ACETAMINOPHEN 325 MG PO TABS: 650.0000 mg | ORAL_TABLET | Freq: Four times a day (QID) | ORAL | 0 refills | Status: DC | PRN

## 2016-06-15 MED ORDER — ACETAMINOPHEN 325 MG PO TABS
650.0000 mg | ORAL_TABLET | Freq: Once | ORAL | Status: AC
  Administered 2016-06-15: 650 mg via ORAL
  Filled 2016-06-15: qty 2

## 2016-06-15 NOTE — ED Provider Notes (Signed)
[place] DEPT Provider Note   CSN: [id number] Arrival date & time: 06/15/16  0263     History   Chief Complaint Chief Complaint  Patient presents with  . Fever  . Sore Throat    HPI Vanessa Alvarez is a 31 y.o. female.   Sore Throat  This is a new problem. The current episode started 2 days ago. The problem occurs constantly. The problem has been gradually worsening. Pertinent negatives include no shortness of breath. Nothing aggravates the symptoms. Nothing relieves the symptoms.    Past Medical History:  Diagnosis Date  . Anemia    Iron Def  . Anxiety   . Autism spectrum   . Behavioral problems   . Depression   . End stage renal disease ([place])   . GERD (gastroesophageal reflux disease)   . Hyperlipidemia   . Hypertension   . HYPOTHYROIDISM 03/12/2009   Qualifier: Diagnosis of  By: Earley Favor MD, Cat    . MENTAL RETARDATION 06/23/2006   Qualifier: Diagnosis of  By: Eusebio Friendly    . Mood disorder [facility])    Sees Dr Silvio Pate, who prescribes meds  . Shortness of breath dyspnea    with exertion    Patient Active Problem List   Diagnosis Date Noted  . Involuntary commitment   . Shortness of breath   . Dialysis patient, noncompliant ([place]) 10/16/2015  . Paranoia ([place]) 10/16/2015  . Hyperphosphatemia 07/11/2015  . Affective disorder ([place]) 07/06/2015  . Rectal bleeding 03/04/2015  . 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 Thyroid activity decreased   . Nausea with vomiting 02/12/2015  . ESRD on dialysis ([place])   . Nystagmus 08/22/2013  . 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 Headache 07/13/2011  . IMPAIRED FASTING GLUCOSE 01/26/2010  . ECZEMA, ATOPIC 11/05/2009  . Hypothyroidism 03/12/2009  . Obesity, Class II, BMI 35-39.9 02/26/2009  . Essential hypertension 02/26/2009  . Iron deficiency anemia 05/20/2008  . ANXIETY 06/23/2006  . MENTAL RETARDATION 06/23/2006    Past Surgical History:  Procedure Laterality Date  . AV FISTULA PLACEMENT Left  03/17/2015   Procedure: CREATION OF LEFT BRACHIOCEPHALIC ARTERIOVENOUS (AV) FISTULA ;  Surgeon: Conrad [place], MD;  Location: [place];  Service: Vascular;  Laterality: Left;  . CHOLECYSTECTOMY  11/2004  . INSERTION OF DIALYSIS CATHETER Right 03/17/2015   Procedure: INSERTION OF DIALYSIS CATHETER RIGHT INTERNAL JUIGULAR PLACEMENT;  Surgeon: Conrad , MD;  Location[facility];  Service: Vascular;  Laterality: Right;    OB History    Gravida Para Term Preterm AB Living   0 0 0 0 0     SAB TAB Ectopic Multiple Live Births   0 0 0           Home Medications    Prior to Admission medications   Medication Sig Start Date End Date Taking? Authorizing Provider  ARIPiprazole (ABILIFY) 5 MG tablet Take 5 mg by mouth at bedtime.   Yes Historical Provider, MD  B Complex-C-Folic Acid (DIALYVITE 785 PO) Take 1 tablet by mouth daily.    Yes Historical Provider, MD  benztropine (COGENTIN) 0.5 MG tablet Take 0.5 mg by mouth daily as needed for tremors.  02/02/16  Yes Historical Provider, MD  busPIRone (BUSPAR) 5 MG tablet Take 5 mg by mouth 3 (three) times daily.  10/08/15  Yes Historical Provider, MD  ferrous sulfate 325 (65 FE) MG tablet Take 1 tablet (325 mg total) by mouth 2 (two) times daily. 01/07/12  Yes Clovis Cao, MD  lamoTRIgine (LAMICTAL) 150 MG tablet Take 150 mg by mouth at bedtime. 09/18/15  Yes Historical Provider, MD  levothyroxine (SYNTHROID, LEVOTHROID) 112 MCG tablet TAKE 1 TABLET DAILY BEFORE BREAKFAST. 07/14/15  Yes Ashly Windell Moulding, DO  omeprazole (PRILOSEC) 20 MG capsule TAKE ONE CAPSULE BY MOUTH EVERY DAY 05/31/16  Yes Ashly M Gottschalk, DO  polyethylene glycol powder (GLYCOLAX/MIRALAX) powder Take 17 g by mouth daily as needed for mild constipation. Reported on 05/26/2015 03/03/15  Yes Historical Provider, MD  sertraline (ZOLOFT) 100 MG tablet Take 100 mg by mouth daily.    Yes Historical Provider, MD  sevelamer carbonate (RENVELA) 800 MG tablet Take 2,400 mg by mouth 3 (three) times  daily with meals.    Yes Historical Provider, MD  SUMAtriptan (IMITREX) 25 MG tablet Take 1 tablet (25 mg total) by mouth every 2 (two) hours as needed for migraine. May repeat in 2 hours ONCE if headache persists or recurs. 10/06/15  Yes Ashly Windell Moulding, DO  acetaminophen (TYLENOL) 325 MG tablet Take 2 tablets (650 mg total) by mouth every 6 (six) hours as needed for moderate pain or fever. 06/15/16   Merrily Pew, MD  ondansetron (ZOFRAN) 4 MG tablet Take 1 tablet (4 mg total) by mouth every 8 (eight) hours as needed for nausea or vomiting. 06/15/16   Merrily Pew, MD  oseltamivir (TAMIFLU) 75 MG capsule Take 1 capsule (75 mg total) by mouth every 12 (twelve) hours. 06/15/16   Merrily Pew, MD    Family History Family History  Problem Relation Age of Onset  . Hypertension Mother   . Diabetes Maternal Grandmother   . Hypertension Maternal Grandmother   . Heart disease Maternal Grandmother   . Bladder Cancer Maternal Grandmother   . Diabetes Maternal Grandfather     Social History Social History  Substance Use Topics  . Smoking status: Never Smoker  . Smokeless tobacco: Never Used  . Alcohol use No     Allergies   Patient has no known allergies.   Review of Systems Review of Systems  Constitutional: Positive for fever. Negative for chills.  Eyes: Negative for pain.  Respiratory: Negative for cough and shortness of breath.   Musculoskeletal: Positive for arthralgias and myalgias.  All other systems reviewed and are negative.    Physical Exam Updated Vital Signs BP 134/87 (BP Location: Right Arm)   Pulse 111   Temp 100.2 F (37.9 C) (Oral)   Resp (!) 27   Ht 5\' 4"  (1.626 m)   SpO2 100%   Physical Exam  Constitutional: She is oriented to person, place, and time. She appears well-developed and well-nourished.  HENT:  Head: Normocephalic and atraumatic.  Eyes: Conjunctivae and EOM are normal.  Neck: Normal range of motion.  Cardiovascular: Regular rhythm.   Tachycardia present.   Pulmonary/Chest: Effort normal. No stridor. No respiratory distress.  Abdominal: She exhibits no distension.  Musculoskeletal: Normal range of motion. She exhibits no edema or deformity.  Neurological: She is alert and oriented to person, place, and time.  Skin: Skin is warm and dry. No erythema. No pallor.  Nursing note and vitals reviewed.    ED Treatments / Results  Labs (all labs ordered are listed, but only abnormal results are displayed) Labs Reviewed  INFLUENZA PANEL BY PCR (TYPE A & B) - Abnormal; Notable for the following:       Result Value   Influenza A By PCR POSITIVE (*)    All other components within normal limits  I-STAT  CHEM 8, ED - Abnormal; Notable for the following:    Potassium 5.4 (*)    BUN 43 (*)    Creatinine, Ser 11.00 (*)    Calcium, Ion 1.13 (*)    Hemoglobin 10.9 (*)    HCT 32.0 (*)    All other components within normal limits  RAPID STREP SCREEN (NOT AT [facility])  CULTURE, GROUP A STREP [facility])    EKG  EKG Interpretation None       Radiology Dg Chest 2 View  Result Date: 06/15/2016 CLINICAL DATA:  Chest pain and cough for the past 3 days. History of dialysis dependent renal failure EXAM: CHEST  2 VIEW COMPARISON:  Portable chest x-ray of October 16, 2015 FINDINGS: The lungs are adequately inflated and clear. The interstitial markings are mildly prominent but much improved over the previous study. The cardiac silhouette is top-normal in size. The central pulmonary vascularity is mildly prominent. There is no pleural effusion or pneumothorax. There is stable moderate dextrocurvature centered in the mid to lower thoracic spine. IMPRESSION: No pneumonia nor definite CHF. Mild interstitial prominence is present bilaterally. Electronically Signed   By: David  Martinique M.D.   On: 06/15/2016 08:11    Procedures Procedures (including critical care time)  Medications Ordered in ED Medications  oseltamivir (TAMIFLU) capsule 75 mg (not  administered)  sodium polystyrene (KAYEXALATE) 15 GM/60ML suspension 30 g (not administered)  acetaminophen (TYLENOL) tablet 650 mg (650 mg Oral Given 06/15/16 0725)     Initial Impression / Assessment and Plan / ED Course  I have reviewed the triage vital signs and the nursing notes.  Pertinent labs & imaging results that were available during my care of the patient were reviewed by me and considered in my medical decision making (see chart for details).   Here with flulike symptoms. We'll do screening labs to make sure she doesn't need emergent dialysis and discuss with the dialysis center to try to get her dialysis soon.  Spoke with the dialysis center on [address] who stated they did not have any room to get her dialysis. I will discuss with the nephrologist here to see if there is any way to get dialysis here.   Discussed the case with Dr. Jimmy Footman who said he will contact the patient's dialysis Center once again to get the dialysis set up for today or tomorrow. Patient given a dose of Kayexalate in the emergency department. Started on Tamiflu for influenza a positive PCR.  Final Clinical Impressions(s) / ED Diagnoses   Final diagnoses:  Influenza    New Prescriptions New Prescriptions   ACETAMINOPHEN (TYLENOL) 325 MG TABLET    Take 2 tablets (650 mg total) by mouth every 6 (six) hours as needed for moderate pain or fever.   ONDANSETRON (ZOFRAN) 4 MG TABLET    Take 1 tablet (4 mg total) by mouth every 8 (eight) hours as needed for nausea or vomiting.   OSELTAMIVIR (TAMIFLU) 75 MG CAPSULE    Take 1 capsule (75 mg total) by mouth every 12 (twelve) hours.     Merrily Pew, MD 06/15/16 [phone]

## 2016-06-15 NOTE — Telephone Encounter (Signed)
Pt informed. Sharon T Saunders, CMA  

## 2016-06-15 NOTE — ED Triage Notes (Signed)
Pt reports a fever this morning along w/ a sore throat X2 days.  She also reports a non-productive cough.  Pt is a dialysis pt (Tuesday, Wed, Sat).  Last treatment was Saturday.

## 2016-06-17 DIAGNOSIS — D631 Anemia in chronic kidney disease: Secondary | ICD-10-CM | POA: Diagnosis not present

## 2016-06-17 DIAGNOSIS — N2581 Secondary hyperparathyroidism of renal origin: Secondary | ICD-10-CM | POA: Diagnosis not present

## 2016-06-17 DIAGNOSIS — N186 End stage renal disease: Secondary | ICD-10-CM | POA: Diagnosis not present

## 2016-06-17 LAB — CULTURE, GROUP A STREP (THRC)

## 2016-06-19 DIAGNOSIS — N2581 Secondary hyperparathyroidism of renal origin: Secondary | ICD-10-CM | POA: Diagnosis not present

## 2016-06-19 DIAGNOSIS — D631 Anemia in chronic kidney disease: Secondary | ICD-10-CM | POA: Diagnosis not present

## 2016-06-19 DIAGNOSIS — N186 End stage renal disease: Secondary | ICD-10-CM | POA: Diagnosis not present

## 2016-06-22 DIAGNOSIS — N186 End stage renal disease: Secondary | ICD-10-CM | POA: Diagnosis not present

## 2016-06-22 DIAGNOSIS — N2581 Secondary hyperparathyroidism of renal origin: Secondary | ICD-10-CM | POA: Diagnosis not present

## 2016-06-22 DIAGNOSIS — D631 Anemia in chronic kidney disease: Secondary | ICD-10-CM | POA: Diagnosis not present

## 2016-06-23 DIAGNOSIS — Z992 Dependence on renal dialysis: Secondary | ICD-10-CM | POA: Diagnosis not present

## 2016-06-23 DIAGNOSIS — N186 End stage renal disease: Secondary | ICD-10-CM | POA: Diagnosis not present

## 2016-06-23 DIAGNOSIS — I158 Other secondary hypertension: Secondary | ICD-10-CM | POA: Diagnosis not present

## 2016-06-24 DIAGNOSIS — N186 End stage renal disease: Secondary | ICD-10-CM | POA: Diagnosis not present

## 2016-06-24 DIAGNOSIS — D631 Anemia in chronic kidney disease: Secondary | ICD-10-CM | POA: Diagnosis not present

## 2016-06-24 DIAGNOSIS — N2581 Secondary hyperparathyroidism of renal origin: Secondary | ICD-10-CM | POA: Diagnosis not present

## 2016-06-26 DIAGNOSIS — D631 Anemia in chronic kidney disease: Secondary | ICD-10-CM | POA: Diagnosis not present

## 2016-06-26 DIAGNOSIS — N186 End stage renal disease: Secondary | ICD-10-CM | POA: Diagnosis not present

## 2016-06-26 DIAGNOSIS — N2581 Secondary hyperparathyroidism of renal origin: Secondary | ICD-10-CM | POA: Diagnosis not present

## 2016-06-29 DIAGNOSIS — N186 End stage renal disease: Secondary | ICD-10-CM | POA: Diagnosis not present

## 2016-06-29 DIAGNOSIS — N2581 Secondary hyperparathyroidism of renal origin: Secondary | ICD-10-CM | POA: Diagnosis not present

## 2016-06-29 DIAGNOSIS — D631 Anemia in chronic kidney disease: Secondary | ICD-10-CM | POA: Diagnosis not present

## 2016-07-01 DIAGNOSIS — N2581 Secondary hyperparathyroidism of renal origin: Secondary | ICD-10-CM | POA: Diagnosis not present

## 2016-07-01 DIAGNOSIS — N186 End stage renal disease: Secondary | ICD-10-CM | POA: Diagnosis not present

## 2016-07-01 DIAGNOSIS — D631 Anemia in chronic kidney disease: Secondary | ICD-10-CM | POA: Diagnosis not present

## 2016-07-03 DIAGNOSIS — N186 End stage renal disease: Secondary | ICD-10-CM | POA: Diagnosis not present

## 2016-07-03 DIAGNOSIS — N2581 Secondary hyperparathyroidism of renal origin: Secondary | ICD-10-CM | POA: Diagnosis not present

## 2016-07-03 DIAGNOSIS — D631 Anemia in chronic kidney disease: Secondary | ICD-10-CM | POA: Diagnosis not present

## 2016-07-06 ENCOUNTER — Other Ambulatory Visit: Payer: Self-pay | Admitting: Family Medicine

## 2016-07-06 DIAGNOSIS — D631 Anemia in chronic kidney disease: Secondary | ICD-10-CM | POA: Diagnosis not present

## 2016-07-06 DIAGNOSIS — N2581 Secondary hyperparathyroidism of renal origin: Secondary | ICD-10-CM | POA: Diagnosis not present

## 2016-07-06 DIAGNOSIS — E039 Hypothyroidism, unspecified: Secondary | ICD-10-CM

## 2016-07-06 DIAGNOSIS — N186 End stage renal disease: Secondary | ICD-10-CM | POA: Diagnosis not present

## 2016-07-08 DIAGNOSIS — N2581 Secondary hyperparathyroidism of renal origin: Secondary | ICD-10-CM | POA: Diagnosis not present

## 2016-07-08 DIAGNOSIS — D631 Anemia in chronic kidney disease: Secondary | ICD-10-CM | POA: Diagnosis not present

## 2016-07-08 DIAGNOSIS — N186 End stage renal disease: Secondary | ICD-10-CM | POA: Diagnosis not present

## 2016-07-10 DIAGNOSIS — N2581 Secondary hyperparathyroidism of renal origin: Secondary | ICD-10-CM | POA: Diagnosis not present

## 2016-07-10 DIAGNOSIS — N186 End stage renal disease: Secondary | ICD-10-CM | POA: Diagnosis not present

## 2016-07-10 DIAGNOSIS — D631 Anemia in chronic kidney disease: Secondary | ICD-10-CM | POA: Diagnosis not present

## 2016-07-11 ENCOUNTER — Other Ambulatory Visit: Payer: Self-pay | Admitting: Family Medicine

## 2016-07-11 DIAGNOSIS — E039 Hypothyroidism, unspecified: Secondary | ICD-10-CM

## 2016-07-13 DIAGNOSIS — N186 End stage renal disease: Secondary | ICD-10-CM | POA: Diagnosis not present

## 2016-07-13 DIAGNOSIS — D631 Anemia in chronic kidney disease: Secondary | ICD-10-CM | POA: Diagnosis not present

## 2016-07-13 DIAGNOSIS — N2581 Secondary hyperparathyroidism of renal origin: Secondary | ICD-10-CM | POA: Diagnosis not present

## 2016-07-15 DIAGNOSIS — N186 End stage renal disease: Secondary | ICD-10-CM | POA: Diagnosis not present

## 2016-07-15 DIAGNOSIS — D631 Anemia in chronic kidney disease: Secondary | ICD-10-CM | POA: Diagnosis not present

## 2016-07-15 DIAGNOSIS — N2581 Secondary hyperparathyroidism of renal origin: Secondary | ICD-10-CM | POA: Diagnosis not present

## 2016-07-17 DIAGNOSIS — D631 Anemia in chronic kidney disease: Secondary | ICD-10-CM | POA: Diagnosis not present

## 2016-07-17 DIAGNOSIS — N186 End stage renal disease: Secondary | ICD-10-CM | POA: Diagnosis not present

## 2016-07-17 DIAGNOSIS — N2581 Secondary hyperparathyroidism of renal origin: Secondary | ICD-10-CM | POA: Diagnosis not present

## 2016-07-20 DIAGNOSIS — N2581 Secondary hyperparathyroidism of renal origin: Secondary | ICD-10-CM | POA: Diagnosis not present

## 2016-07-20 DIAGNOSIS — D631 Anemia in chronic kidney disease: Secondary | ICD-10-CM | POA: Diagnosis not present

## 2016-07-20 DIAGNOSIS — N186 End stage renal disease: Secondary | ICD-10-CM | POA: Diagnosis not present

## 2016-07-22 DIAGNOSIS — N2581 Secondary hyperparathyroidism of renal origin: Secondary | ICD-10-CM | POA: Diagnosis not present

## 2016-07-22 DIAGNOSIS — N186 End stage renal disease: Secondary | ICD-10-CM | POA: Diagnosis not present

## 2016-07-22 DIAGNOSIS — D631 Anemia in chronic kidney disease: Secondary | ICD-10-CM | POA: Diagnosis not present

## 2016-07-24 DIAGNOSIS — N2581 Secondary hyperparathyroidism of renal origin: Secondary | ICD-10-CM | POA: Diagnosis not present

## 2016-07-24 DIAGNOSIS — I158 Other secondary hypertension: Secondary | ICD-10-CM | POA: Diagnosis not present

## 2016-07-24 DIAGNOSIS — Z992 Dependence on renal dialysis: Secondary | ICD-10-CM | POA: Diagnosis not present

## 2016-07-24 DIAGNOSIS — D631 Anemia in chronic kidney disease: Secondary | ICD-10-CM | POA: Diagnosis not present

## 2016-07-24 DIAGNOSIS — N186 End stage renal disease: Secondary | ICD-10-CM | POA: Diagnosis not present

## 2016-07-26 ENCOUNTER — Ambulatory Visit: Payer: Medicare Other | Admitting: Obstetrics & Gynecology

## 2016-07-27 DIAGNOSIS — D631 Anemia in chronic kidney disease: Secondary | ICD-10-CM | POA: Diagnosis not present

## 2016-07-27 DIAGNOSIS — N2581 Secondary hyperparathyroidism of renal origin: Secondary | ICD-10-CM | POA: Diagnosis not present

## 2016-07-27 DIAGNOSIS — N186 End stage renal disease: Secondary | ICD-10-CM | POA: Diagnosis not present

## 2016-07-29 DIAGNOSIS — N186 End stage renal disease: Secondary | ICD-10-CM | POA: Diagnosis not present

## 2016-07-29 DIAGNOSIS — N2581 Secondary hyperparathyroidism of renal origin: Secondary | ICD-10-CM | POA: Diagnosis not present

## 2016-07-29 DIAGNOSIS — D631 Anemia in chronic kidney disease: Secondary | ICD-10-CM | POA: Diagnosis not present

## 2016-07-31 DIAGNOSIS — N186 End stage renal disease: Secondary | ICD-10-CM | POA: Diagnosis not present

## 2016-07-31 DIAGNOSIS — N2581 Secondary hyperparathyroidism of renal origin: Secondary | ICD-10-CM | POA: Diagnosis not present

## 2016-07-31 DIAGNOSIS — D631 Anemia in chronic kidney disease: Secondary | ICD-10-CM | POA: Diagnosis not present

## 2016-08-02 DIAGNOSIS — F71 Moderate intellectual disabilities: Secondary | ICD-10-CM | POA: Diagnosis not present

## 2016-08-02 DIAGNOSIS — F319 Bipolar disorder, unspecified: Secondary | ICD-10-CM | POA: Diagnosis not present

## 2016-08-03 DIAGNOSIS — N186 End stage renal disease: Secondary | ICD-10-CM | POA: Diagnosis not present

## 2016-08-03 DIAGNOSIS — N2581 Secondary hyperparathyroidism of renal origin: Secondary | ICD-10-CM | POA: Diagnosis not present

## 2016-08-03 DIAGNOSIS — D631 Anemia in chronic kidney disease: Secondary | ICD-10-CM | POA: Diagnosis not present

## 2016-08-05 DIAGNOSIS — N2581 Secondary hyperparathyroidism of renal origin: Secondary | ICD-10-CM | POA: Diagnosis not present

## 2016-08-05 DIAGNOSIS — D631 Anemia in chronic kidney disease: Secondary | ICD-10-CM | POA: Diagnosis not present

## 2016-08-05 DIAGNOSIS — N186 End stage renal disease: Secondary | ICD-10-CM | POA: Diagnosis not present

## 2016-08-06 ENCOUNTER — Ambulatory Visit (INDEPENDENT_AMBULATORY_CARE_PROVIDER_SITE_OTHER): Payer: Medicare Other | Admitting: Obstetrics & Gynecology

## 2016-08-06 ENCOUNTER — Encounter: Payer: Self-pay | Admitting: Obstetrics & Gynecology

## 2016-08-06 VITALS — BP 160/84 | Ht 64.0 in | Wt 159.0 lb

## 2016-08-06 DIAGNOSIS — Z01411 Encounter for gynecological examination (general) (routine) with abnormal findings: Secondary | ICD-10-CM

## 2016-08-06 DIAGNOSIS — N051 Unspecified nephritic syndrome with focal and segmental glomerular lesions: Secondary | ICD-10-CM | POA: Diagnosis not present

## 2016-08-06 DIAGNOSIS — F39 Unspecified mood [affective] disorder: Secondary | ICD-10-CM

## 2016-08-06 DIAGNOSIS — F068 Other specified mental disorders due to known physiological condition: Secondary | ICD-10-CM

## 2016-08-06 NOTE — Progress Notes (Signed)
    Vanessa Alvarez 1985-11-03 [id number]        31 y.o.  G0P0000 single, no coitarche.  Affective disorder, Autism with Anxiety.   New patient presenting accompanied by her mother for her first Annual/Gyn exam with a Pap test.  She needs a Pap test in order to get on the Kidney transplant waiting list.  Has Renal failure on Dialysis secondary to FSGS.  Menses reg normal.  No pelvic pain.  No abnormal vaginal d/c.  Breasts wnl.  Past medical history,surgical history, problem list, medications, allergies, family history and social history were all reviewed and documented in the EPIC chart.  Directed ROS with pertinent positives and negatives documented in the history of present illness/assessment and plan.  Exam:  Vitals:   08/06/16 1412  BP: (!) 160/84  Weight: 159 lb (72.1 kg)  Height: 5\' 4"  (1.626 m)   General appearance:  Anxiety.  Refuses Gyn exam/Pap test today.   Assessment/Plan:  31 y.o. G0P0000.  1. 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 Anxiety worsening with the plan of doing a Gyn exam with Pap test today.  Patient refused.  Able to understand and discuss her issues.  2. Encounter for gynecological examination with abnormal finding Anxiety, gyn exam not performed  3. FSGS (focal segmental glomerulosclerosis) Needs Pap test to be on the waiting list for Kidney Transplant.  On Dialysis currently.  4. Affective disorder ([place]) Autism with anxiety/Paranoia/Mental retardation.   Counseling on how we will prepare for her Gyn exam/Pap test with Buspar 5 mg PO before next visit.  Gyn exam and how I will do the Pap test explained to the patient, using a virgin speculum with water, very gently.  Reassured that I would stop if she experienced pain.  Importance of Breast exam and Gyn exam to verify her ovaries and uterus also discussed.  Counseling 100% x 30 min.  Princess Bruins MD, 2:46 PM 08/06/2016

## 2016-08-06 NOTE — Patient Instructions (Addendum)
Vanessa Alvarez, it was a pleasure to meet you today.  I will see you again next week.  Please take 1 tab of Buspar 5 mg about 1 hour before the visit to help you relax.  Take care, Dr. Dellis Filbert

## 2016-08-07 DIAGNOSIS — D631 Anemia in chronic kidney disease: Secondary | ICD-10-CM | POA: Diagnosis not present

## 2016-08-07 DIAGNOSIS — N186 End stage renal disease: Secondary | ICD-10-CM | POA: Diagnosis not present

## 2016-08-07 DIAGNOSIS — N2581 Secondary hyperparathyroidism of renal origin: Secondary | ICD-10-CM | POA: Diagnosis not present

## 2016-08-10 DIAGNOSIS — N186 End stage renal disease: Secondary | ICD-10-CM | POA: Diagnosis not present

## 2016-08-10 DIAGNOSIS — D631 Anemia in chronic kidney disease: Secondary | ICD-10-CM | POA: Diagnosis not present

## 2016-08-10 DIAGNOSIS — N2581 Secondary hyperparathyroidism of renal origin: Secondary | ICD-10-CM | POA: Diagnosis not present

## 2016-08-12 DIAGNOSIS — N2581 Secondary hyperparathyroidism of renal origin: Secondary | ICD-10-CM | POA: Diagnosis not present

## 2016-08-12 DIAGNOSIS — D631 Anemia in chronic kidney disease: Secondary | ICD-10-CM | POA: Diagnosis not present

## 2016-08-12 DIAGNOSIS — N186 End stage renal disease: Secondary | ICD-10-CM | POA: Diagnosis not present

## 2016-08-14 DIAGNOSIS — N2581 Secondary hyperparathyroidism of renal origin: Secondary | ICD-10-CM | POA: Diagnosis not present

## 2016-08-14 DIAGNOSIS — D631 Anemia in chronic kidney disease: Secondary | ICD-10-CM | POA: Diagnosis not present

## 2016-08-14 DIAGNOSIS — N186 End stage renal disease: Secondary | ICD-10-CM | POA: Diagnosis not present

## 2016-08-17 DIAGNOSIS — N186 End stage renal disease: Secondary | ICD-10-CM | POA: Diagnosis not present

## 2016-08-17 DIAGNOSIS — D631 Anemia in chronic kidney disease: Secondary | ICD-10-CM | POA: Diagnosis not present

## 2016-08-17 DIAGNOSIS — N2581 Secondary hyperparathyroidism of renal origin: Secondary | ICD-10-CM | POA: Diagnosis not present

## 2016-08-19 DIAGNOSIS — D631 Anemia in chronic kidney disease: Secondary | ICD-10-CM | POA: Diagnosis not present

## 2016-08-19 DIAGNOSIS — N2581 Secondary hyperparathyroidism of renal origin: Secondary | ICD-10-CM | POA: Diagnosis not present

## 2016-08-19 DIAGNOSIS — N186 End stage renal disease: Secondary | ICD-10-CM | POA: Diagnosis not present

## 2016-08-21 DIAGNOSIS — D631 Anemia in chronic kidney disease: Secondary | ICD-10-CM | POA: Diagnosis not present

## 2016-08-21 DIAGNOSIS — N2581 Secondary hyperparathyroidism of renal origin: Secondary | ICD-10-CM | POA: Diagnosis not present

## 2016-08-21 DIAGNOSIS — N186 End stage renal disease: Secondary | ICD-10-CM | POA: Diagnosis not present

## 2016-08-23 DIAGNOSIS — I158 Other secondary hypertension: Secondary | ICD-10-CM | POA: Diagnosis not present

## 2016-08-23 DIAGNOSIS — Z992 Dependence on renal dialysis: Secondary | ICD-10-CM | POA: Diagnosis not present

## 2016-08-23 DIAGNOSIS — N186 End stage renal disease: Secondary | ICD-10-CM | POA: Diagnosis not present

## 2016-08-24 DIAGNOSIS — D631 Anemia in chronic kidney disease: Secondary | ICD-10-CM | POA: Diagnosis not present

## 2016-08-24 DIAGNOSIS — N2581 Secondary hyperparathyroidism of renal origin: Secondary | ICD-10-CM | POA: Diagnosis not present

## 2016-08-24 DIAGNOSIS — N186 End stage renal disease: Secondary | ICD-10-CM | POA: Diagnosis not present

## 2016-08-26 DIAGNOSIS — N2581 Secondary hyperparathyroidism of renal origin: Secondary | ICD-10-CM | POA: Diagnosis not present

## 2016-08-26 DIAGNOSIS — D631 Anemia in chronic kidney disease: Secondary | ICD-10-CM | POA: Diagnosis not present

## 2016-08-26 DIAGNOSIS — N186 End stage renal disease: Secondary | ICD-10-CM | POA: Diagnosis not present

## 2016-08-28 DIAGNOSIS — N2581 Secondary hyperparathyroidism of renal origin: Secondary | ICD-10-CM | POA: Diagnosis not present

## 2016-08-28 DIAGNOSIS — N186 End stage renal disease: Secondary | ICD-10-CM | POA: Diagnosis not present

## 2016-08-28 DIAGNOSIS — D631 Anemia in chronic kidney disease: Secondary | ICD-10-CM | POA: Diagnosis not present

## 2016-08-31 DIAGNOSIS — D631 Anemia in chronic kidney disease: Secondary | ICD-10-CM | POA: Diagnosis not present

## 2016-08-31 DIAGNOSIS — N2581 Secondary hyperparathyroidism of renal origin: Secondary | ICD-10-CM | POA: Diagnosis not present

## 2016-08-31 DIAGNOSIS — N186 End stage renal disease: Secondary | ICD-10-CM | POA: Diagnosis not present

## 2016-09-01 ENCOUNTER — Ambulatory Visit: Payer: Medicare Other | Admitting: *Deleted

## 2016-09-02 DIAGNOSIS — N2581 Secondary hyperparathyroidism of renal origin: Secondary | ICD-10-CM | POA: Diagnosis not present

## 2016-09-02 DIAGNOSIS — N186 End stage renal disease: Secondary | ICD-10-CM | POA: Diagnosis not present

## 2016-09-02 DIAGNOSIS — D631 Anemia in chronic kidney disease: Secondary | ICD-10-CM | POA: Diagnosis not present

## 2016-09-03 ENCOUNTER — Other Ambulatory Visit: Payer: Self-pay | Admitting: Family Medicine

## 2016-09-03 ENCOUNTER — Encounter: Payer: Self-pay | Admitting: Family Medicine

## 2016-09-03 ENCOUNTER — Ambulatory Visit (INDEPENDENT_AMBULATORY_CARE_PROVIDER_SITE_OTHER): Payer: Medicare Other | Admitting: Family Medicine

## 2016-09-03 VITALS — BP 136/83 | HR 93 | Temp 99.0°F | Wt 157.0 lb

## 2016-09-03 DIAGNOSIS — Z992 Dependence on renal dialysis: Secondary | ICD-10-CM | POA: Diagnosis not present

## 2016-09-03 DIAGNOSIS — F411 Generalized anxiety disorder: Secondary | ICD-10-CM | POA: Diagnosis not present

## 2016-09-03 DIAGNOSIS — I10 Essential (primary) hypertension: Secondary | ICD-10-CM

## 2016-09-03 DIAGNOSIS — N186 End stage renal disease: Secondary | ICD-10-CM

## 2016-09-03 MED ORDER — LORAZEPAM 0.5 MG PO TABS: ORAL_TABLET | ORAL | 0 refills | Status: DC

## 2016-09-03 NOTE — Progress Notes (Signed)
    Subjective: CC: BP follow up HPI: Vanessa Alvarez is a 31 y.o. female presenting to clinic today for:  1. Elevated BP Patient reports that she was seen at her GYN's office recently and noted to have elevated BP.  She reports that she was supposed to have her pap smear performed in preparation for eligibility for renal transplant but was too anxious to proceed, as she is a virgin.  She has rescheduled to attempt later in the month.  She reports that she has been told at HD that she has had elevated BP too.  She is worried that if she is started on antihypertensives, her BP will bottom out during HD.  She denies CP, SOB, nausea, vomiting, dizziness.  Social Hx reviewed: non smoker. MedHx, medications and allergies reviewed.  Please see EMR. Health Maintenance: pap smear needed ROS: Per HPI  Objective: Office vital signs reviewed. BP 136/83   Pulse 93   Temp 99 F (37.2 C) (Oral)   Wt 157 lb (71.2 kg)   LMP 08/20/2016   SpO2 99%   BMI 26.95 kg/m   Physical Examination:  General: Awake, alert, well nourished, No acute distress HEENT: Normal    Eyes: PERRLA, sclera white Cardio: regular rate and rhythm, S1S2 heard, soft systolic murmur appreciated Pulm: clear to auscultation bilaterally, no wheezes, rhonchi or rales; normal work of breathing on room air Extremities: warm, well perfused, No edema, cyanosis or clubbing; +2 pulses bilaterally; LUE with patent AVF.  MSK: normal gait and normal station Skin: dry; intact; no rashes or lesions; healing excoriation along RUE.  Assessment/ Plan: 31 y.o. female   Essential hypertension BP controlled during this visit.  I wonder if her elevated BPs are situational (HD and GYN exam).  I reviewed with patient and mother that I'd like her to record pre and post BPs during HD for the next 2 weeks and call with results.  I will plan to order Norvasc pending these values.  No red flags.    Anxiety state Lorazepam 0.5mg  PO 30 minutes prior to  GYN exam.  Rx provided.  Follow up prn.   Vanessa Norlander, DO PGY-3, [facility]

## 2016-09-03 NOTE — Patient Instructions (Addendum)
Remember to keep a log of your blood pressures at hemodialysis.  Keep a record of the before and after blood pressures for the next 2 weeks and call me with them.  If they are elevated, we will start Amlodipine.

## 2016-09-04 DIAGNOSIS — N186 End stage renal disease: Secondary | ICD-10-CM | POA: Diagnosis not present

## 2016-09-04 DIAGNOSIS — D631 Anemia in chronic kidney disease: Secondary | ICD-10-CM | POA: Diagnosis not present

## 2016-09-04 DIAGNOSIS — N2581 Secondary hyperparathyroidism of renal origin: Secondary | ICD-10-CM | POA: Diagnosis not present

## 2016-09-05 NOTE — Assessment & Plan Note (Signed)
BP controlled during this visit.  I wonder if her elevated BPs are situational (HD and GYN exam).  I reviewed with patient and mother that I'd like her to record pre and post BPs during HD for the next 2 weeks and call with results.  I will plan to order Norvasc pending these values.  No red flags.

## 2016-09-05 NOTE — Assessment & Plan Note (Signed)
Lorazepam 0.5mg  PO 30 minutes prior to GYN exam.  Rx provided.  Follow up prn.

## 2016-09-07 DIAGNOSIS — D631 Anemia in chronic kidney disease: Secondary | ICD-10-CM | POA: Diagnosis not present

## 2016-09-07 DIAGNOSIS — N186 End stage renal disease: Secondary | ICD-10-CM | POA: Diagnosis not present

## 2016-09-07 DIAGNOSIS — N2581 Secondary hyperparathyroidism of renal origin: Secondary | ICD-10-CM | POA: Diagnosis not present

## 2016-09-09 DIAGNOSIS — D631 Anemia in chronic kidney disease: Secondary | ICD-10-CM | POA: Diagnosis not present

## 2016-09-09 DIAGNOSIS — N2581 Secondary hyperparathyroidism of renal origin: Secondary | ICD-10-CM | POA: Diagnosis not present

## 2016-09-09 DIAGNOSIS — N186 End stage renal disease: Secondary | ICD-10-CM | POA: Diagnosis not present

## 2016-09-10 ENCOUNTER — Ambulatory Visit: Payer: Medicare Other | Admitting: Obstetrics & Gynecology

## 2016-09-11 DIAGNOSIS — N2581 Secondary hyperparathyroidism of renal origin: Secondary | ICD-10-CM | POA: Diagnosis not present

## 2016-09-11 DIAGNOSIS — D631 Anemia in chronic kidney disease: Secondary | ICD-10-CM | POA: Diagnosis not present

## 2016-09-11 DIAGNOSIS — N186 End stage renal disease: Secondary | ICD-10-CM | POA: Diagnosis not present

## 2016-09-14 DIAGNOSIS — N186 End stage renal disease: Secondary | ICD-10-CM | POA: Diagnosis not present

## 2016-09-14 DIAGNOSIS — N2581 Secondary hyperparathyroidism of renal origin: Secondary | ICD-10-CM | POA: Diagnosis not present

## 2016-09-14 DIAGNOSIS — D631 Anemia in chronic kidney disease: Secondary | ICD-10-CM | POA: Diagnosis not present

## 2016-09-16 DIAGNOSIS — N2581 Secondary hyperparathyroidism of renal origin: Secondary | ICD-10-CM | POA: Diagnosis not present

## 2016-09-16 DIAGNOSIS — D631 Anemia in chronic kidney disease: Secondary | ICD-10-CM | POA: Diagnosis not present

## 2016-09-16 DIAGNOSIS — N186 End stage renal disease: Secondary | ICD-10-CM | POA: Diagnosis not present

## 2016-09-17 ENCOUNTER — Ambulatory Visit (INDEPENDENT_AMBULATORY_CARE_PROVIDER_SITE_OTHER): Payer: Medicare Other | Admitting: Obstetrics & Gynecology

## 2016-09-17 ENCOUNTER — Encounter: Payer: Self-pay | Admitting: Obstetrics & Gynecology

## 2016-09-17 VITALS — BP 142/86

## 2016-09-17 DIAGNOSIS — Z7682 Awaiting organ transplant status: Secondary | ICD-10-CM

## 2016-09-17 DIAGNOSIS — N051 Unspecified nephritic syndrome with focal and segmental glomerular lesions: Secondary | ICD-10-CM

## 2016-09-17 DIAGNOSIS — Z124 Encounter for screening for malignant neoplasm of cervix: Secondary | ICD-10-CM

## 2016-09-17 NOTE — Patient Instructions (Signed)
1. 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 Kidney transplant candidate Needs Pap test done to be on the Kidney transplant list.  Vanessa Alvarez, good to see you today!  I will inform you of the pap test result as soon as available.

## 2016-09-17 NOTE — Progress Notes (Signed)
    FELICITA NUNCIO 1985/09/20 [id number]        31 y.o.  G0 single.  No coitarche.  RP:  Needs pap test to be on the kidney transplant list  FSGS, needs kidney transplant.  Past medical history,surgical history, problem list, medications, allergies, family history and social history were all reviewed and documented in the EPIC chart.  Directed ROS with pertinent positives and negatives documented in the history of present illness/assessment and plan.  Exam:  Vitals:   09/17/16 1420  BP: (!) 142/86   General appearance:  Normal  Gyn exam:  Vulva normal                     Speculum (virgin speculum used): Vagina, cervix normal.  Pap test done.  Assessment/Plan:  31 y.o. G0P0000   1. 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 Kidney transplant candidate Needs Pap test done to be on the Kidney transplant list.  Princess Bruins MD, 2:41 PM 09/17/2016

## 2016-09-18 DIAGNOSIS — D631 Anemia in chronic kidney disease: Secondary | ICD-10-CM | POA: Diagnosis not present

## 2016-09-18 DIAGNOSIS — N2581 Secondary hyperparathyroidism of renal origin: Secondary | ICD-10-CM | POA: Diagnosis not present

## 2016-09-18 DIAGNOSIS — N186 End stage renal disease: Secondary | ICD-10-CM | POA: Diagnosis not present

## 2016-09-21 DIAGNOSIS — N186 End stage renal disease: Secondary | ICD-10-CM | POA: Diagnosis not present

## 2016-09-21 DIAGNOSIS — N2581 Secondary hyperparathyroidism of renal origin: Secondary | ICD-10-CM | POA: Diagnosis not present

## 2016-09-21 DIAGNOSIS — D631 Anemia in chronic kidney disease: Secondary | ICD-10-CM | POA: Diagnosis not present

## 2016-09-22 LAB — PAP IG W/ RFLX HPV ASCU

## 2016-09-23 ENCOUNTER — Other Ambulatory Visit: Payer: Self-pay | Admitting: Family Medicine

## 2016-09-23 DIAGNOSIS — N186 End stage renal disease: Secondary | ICD-10-CM | POA: Diagnosis not present

## 2016-09-23 DIAGNOSIS — Z992 Dependence on renal dialysis: Secondary | ICD-10-CM | POA: Diagnosis not present

## 2016-09-23 DIAGNOSIS — N2581 Secondary hyperparathyroidism of renal origin: Secondary | ICD-10-CM | POA: Diagnosis not present

## 2016-09-23 DIAGNOSIS — I158 Other secondary hypertension: Secondary | ICD-10-CM | POA: Diagnosis not present

## 2016-09-23 DIAGNOSIS — D631 Anemia in chronic kidney disease: Secondary | ICD-10-CM | POA: Diagnosis not present

## 2016-09-24 ENCOUNTER — Encounter: Payer: Self-pay | Admitting: Family Medicine

## 2016-09-24 ENCOUNTER — Ambulatory Visit (INDEPENDENT_AMBULATORY_CARE_PROVIDER_SITE_OTHER): Payer: Medicare Other | Admitting: Family Medicine

## 2016-09-24 VITALS — BP 130/86 | HR 84 | Temp 98.3°F | Ht 64.0 in | Wt 156.4 lb

## 2016-09-24 DIAGNOSIS — N186 End stage renal disease: Secondary | ICD-10-CM | POA: Diagnosis not present

## 2016-09-24 DIAGNOSIS — R0789 Other chest pain: Secondary | ICD-10-CM | POA: Diagnosis not present

## 2016-09-24 DIAGNOSIS — I10 Essential (primary) hypertension: Secondary | ICD-10-CM

## 2016-09-24 DIAGNOSIS — Z992 Dependence on renal dialysis: Secondary | ICD-10-CM | POA: Diagnosis not present

## 2016-09-24 NOTE — Assessment & Plan Note (Addendum)
She is not having CP today.  05/2016 EKG reviewed.  Sinus tachycardia but no evidence of ischemia.  CP could be GI related.  Discussed consideration for alternative PPI.  Though she is at increased risk of cardiovascular disease given ESRD on HD status.  No significant family h/o.  Referral placed to cardiology for evaluation/ stress testing.  She will likely require this anyway as she is attempting to get a renal transplant.  Strict precautions discussed.

## 2016-09-24 NOTE — Patient Instructions (Signed)
Your blood pressure looks good off of medications.  Congratulations on your first pap smear!  It was normal. I have placed a referral to cardiology (heart specialist).

## 2016-09-24 NOTE — Progress Notes (Signed)
    Subjective: CC: HTN HPI: Vanessa Alvarez is a 31 y.o. female presenting to clinic today for:  Mother brings in BPs from HD sessions (pre and post).  SBPs range from 110-130s.  DBPs range 60-80s.  She is not on medications.  Denies headaches, dizziness, SOB, nausea, vomiting, LE edema.  She reports compliance with HD sessions.  She reports intermittent episodes of left lower chest pain that is sharp in nature.  She does not associate pain with eating or activities.  She notes that pain can last minutes to hours.  She takes Prilosec daily, which does not help.  Mother reports a great maternal grandmother with h/o CVA and massive MI in 21s.  Otherwise no h/o of cardiovascular disease.  Additionally, patient had her pap smear done.  She continues to prepare for candidacy for renal transplant.  Social Hx reviewed: non smoker. MedHx, medications and allergies reviewed.  Please see EMR. ROS: Per HPI  Objective: Office vital signs reviewed. BP 130/86   Pulse 84   Temp 98.3 F (36.8 C) (Oral)   Ht 5\' 4"  (1.626 m)   Wt 156 lb 6.4 oz (70.9 kg)   LMP 09/23/2016 (Exact Date)   SpO2 99%   BMI 26.85 kg/m   Physical Examination:  General: Awake, alert, well appearing female, No acute distress, accompanied by mother to appointment HEENT: MMM, sclera white Cardio: regular rate and rhythm, S1S2 heard, no murmurs appreciated Pulm: clear to auscultation bilaterally, no wheezes, rhonchi or rales; normal work of breathing on room air Extremities: warm, well perfused, No edema, cyanosis or clubbing; +2 pulses bilaterally; LUE with patent AVF  Assessment/ Plan: 31 y.o. female   Essential hypertension Doing well off of medication.  She is at goal.  Instructed mother to continue to monitor BPs and that if elevated BPs seen to follow up in clinic.  I suspect that isolated elevations were related to stress induced situations (pap smear).  She is having some atypical left sided CP intermittently.   Outlined in separate problem.  Atypical chest pain She is not having CP today.  05/2016 EKG reviewed.  Sinus tachycardia but no evidence of ischemia.  CP could be GI related.  Discussed consideration for alternative PPI.  Though she is at increased risk of cardiovascular disease given ESRD on HD status.  No significant family h/o.  Referral placed to cardiology for evaluation/ stress testing.  She will likely require this anyway as she is attempting to get a renal transplant.  Strict precautions discussed.   [name], DO PGY-3, [facility]

## 2016-09-24 NOTE — Assessment & Plan Note (Signed)
Doing well off of medication.  She is at goal.  Instructed mother to continue to monitor BPs and that if elevated BPs seen to follow up in clinic.  I suspect that isolated elevations were related to stress induced situations (pap smear).  She is having some atypical left sided CP intermittently.  Outlined in separate problem.

## 2016-09-25 DIAGNOSIS — D631 Anemia in chronic kidney disease: Secondary | ICD-10-CM | POA: Diagnosis not present

## 2016-09-25 DIAGNOSIS — N186 End stage renal disease: Secondary | ICD-10-CM | POA: Diagnosis not present

## 2016-09-25 DIAGNOSIS — N2581 Secondary hyperparathyroidism of renal origin: Secondary | ICD-10-CM | POA: Diagnosis not present

## 2016-09-28 DIAGNOSIS — D631 Anemia in chronic kidney disease: Secondary | ICD-10-CM | POA: Diagnosis not present

## 2016-09-28 DIAGNOSIS — N186 End stage renal disease: Secondary | ICD-10-CM | POA: Diagnosis not present

## 2016-09-28 DIAGNOSIS — N2581 Secondary hyperparathyroidism of renal origin: Secondary | ICD-10-CM | POA: Diagnosis not present

## 2016-09-30 DIAGNOSIS — D631 Anemia in chronic kidney disease: Secondary | ICD-10-CM | POA: Diagnosis not present

## 2016-09-30 DIAGNOSIS — N186 End stage renal disease: Secondary | ICD-10-CM | POA: Diagnosis not present

## 2016-09-30 DIAGNOSIS — N2581 Secondary hyperparathyroidism of renal origin: Secondary | ICD-10-CM | POA: Diagnosis not present

## 2016-10-02 DIAGNOSIS — N186 End stage renal disease: Secondary | ICD-10-CM | POA: Diagnosis not present

## 2016-10-02 DIAGNOSIS — D631 Anemia in chronic kidney disease: Secondary | ICD-10-CM | POA: Diagnosis not present

## 2016-10-02 DIAGNOSIS — N2581 Secondary hyperparathyroidism of renal origin: Secondary | ICD-10-CM | POA: Diagnosis not present

## 2016-10-05 DIAGNOSIS — N186 End stage renal disease: Secondary | ICD-10-CM | POA: Diagnosis not present

## 2016-10-05 DIAGNOSIS — N2581 Secondary hyperparathyroidism of renal origin: Secondary | ICD-10-CM | POA: Diagnosis not present

## 2016-10-05 DIAGNOSIS — D631 Anemia in chronic kidney disease: Secondary | ICD-10-CM | POA: Diagnosis not present

## 2016-10-07 DIAGNOSIS — D631 Anemia in chronic kidney disease: Secondary | ICD-10-CM | POA: Diagnosis not present

## 2016-10-07 DIAGNOSIS — N186 End stage renal disease: Secondary | ICD-10-CM | POA: Diagnosis not present

## 2016-10-07 DIAGNOSIS — N2581 Secondary hyperparathyroidism of renal origin: Secondary | ICD-10-CM | POA: Diagnosis not present

## 2016-10-09 DIAGNOSIS — N2581 Secondary hyperparathyroidism of renal origin: Secondary | ICD-10-CM | POA: Diagnosis not present

## 2016-10-09 DIAGNOSIS — D631 Anemia in chronic kidney disease: Secondary | ICD-10-CM | POA: Diagnosis not present

## 2016-10-09 DIAGNOSIS — N186 End stage renal disease: Secondary | ICD-10-CM | POA: Diagnosis not present

## 2016-10-12 DIAGNOSIS — D631 Anemia in chronic kidney disease: Secondary | ICD-10-CM | POA: Diagnosis not present

## 2016-10-12 DIAGNOSIS — N2581 Secondary hyperparathyroidism of renal origin: Secondary | ICD-10-CM | POA: Diagnosis not present

## 2016-10-12 DIAGNOSIS — N186 End stage renal disease: Secondary | ICD-10-CM | POA: Diagnosis not present

## 2016-10-14 DIAGNOSIS — D631 Anemia in chronic kidney disease: Secondary | ICD-10-CM | POA: Diagnosis not present

## 2016-10-14 DIAGNOSIS — N2581 Secondary hyperparathyroidism of renal origin: Secondary | ICD-10-CM | POA: Diagnosis not present

## 2016-10-14 DIAGNOSIS — N186 End stage renal disease: Secondary | ICD-10-CM | POA: Diagnosis not present

## 2016-10-16 DIAGNOSIS — D631 Anemia in chronic kidney disease: Secondary | ICD-10-CM | POA: Diagnosis not present

## 2016-10-16 DIAGNOSIS — N2581 Secondary hyperparathyroidism of renal origin: Secondary | ICD-10-CM | POA: Diagnosis not present

## 2016-10-16 DIAGNOSIS — N186 End stage renal disease: Secondary | ICD-10-CM | POA: Diagnosis not present

## 2016-10-19 DIAGNOSIS — N186 End stage renal disease: Secondary | ICD-10-CM | POA: Diagnosis not present

## 2016-10-19 DIAGNOSIS — D631 Anemia in chronic kidney disease: Secondary | ICD-10-CM | POA: Diagnosis not present

## 2016-10-19 DIAGNOSIS — N2581 Secondary hyperparathyroidism of renal origin: Secondary | ICD-10-CM | POA: Diagnosis not present

## 2016-10-21 DIAGNOSIS — D631 Anemia in chronic kidney disease: Secondary | ICD-10-CM | POA: Diagnosis not present

## 2016-10-21 DIAGNOSIS — N186 End stage renal disease: Secondary | ICD-10-CM | POA: Diagnosis not present

## 2016-10-21 DIAGNOSIS — N2581 Secondary hyperparathyroidism of renal origin: Secondary | ICD-10-CM | POA: Diagnosis not present

## 2016-10-23 DIAGNOSIS — N2581 Secondary hyperparathyroidism of renal origin: Secondary | ICD-10-CM | POA: Diagnosis not present

## 2016-10-23 DIAGNOSIS — I158 Other secondary hypertension: Secondary | ICD-10-CM | POA: Diagnosis not present

## 2016-10-23 DIAGNOSIS — Z992 Dependence on renal dialysis: Secondary | ICD-10-CM | POA: Diagnosis not present

## 2016-10-23 DIAGNOSIS — N186 End stage renal disease: Secondary | ICD-10-CM | POA: Diagnosis not present

## 2016-10-23 DIAGNOSIS — D631 Anemia in chronic kidney disease: Secondary | ICD-10-CM | POA: Diagnosis not present

## 2016-10-25 ENCOUNTER — Ambulatory Visit (INDEPENDENT_AMBULATORY_CARE_PROVIDER_SITE_OTHER): Payer: Medicare Other | Admitting: Interventional Cardiology

## 2016-10-25 ENCOUNTER — Encounter: Payer: Self-pay | Admitting: Interventional Cardiology

## 2016-10-25 VITALS — BP 130/88 | HR 91 | Ht 64.0 in | Wt 155.6 lb

## 2016-10-25 DIAGNOSIS — Z992 Dependence on renal dialysis: Secondary | ICD-10-CM

## 2016-10-25 DIAGNOSIS — R0789 Other chest pain: Secondary | ICD-10-CM | POA: Diagnosis not present

## 2016-10-25 DIAGNOSIS — R011 Cardiac murmur, unspecified: Secondary | ICD-10-CM

## 2016-10-25 DIAGNOSIS — N186 End stage renal disease: Secondary | ICD-10-CM

## 2016-10-25 NOTE — Progress Notes (Signed)
Cardiology Office Note   Date:  10/25/2016   ID:  Vanessa Alvarez, DOB 06/04/1985, MRN [id number]  PCP:  Carlyle Dolly, MD    No chief complaint on file. chest pain   Wt Readings from Last 3 Encounters:  10/25/16 155 lb 9.6 oz (70.6 kg)  09/24/16 156 lb 6.4 oz (70.9 kg)  09/03/16 157 lb (71.2 kg)       History of Present Illness: Vanessa Alvarez is a 31 y.o. female  With ESRD, mental retardation, autism spectrum disorder,  who has had a "jumping feeling" in her left chest.  THere is an occasional pain lasting a few minutes in the left chest.  THey can happen rarely with dialysis.  Walking in the house is the most strenuous activity she does, although not regularly.  No CP with walking.  She does not walk up stairs regularly.  BP has been well controlled.   Denies : Chest pain. Dizziness. Leg edema. Nitroglycerin use. Orthopnea. Palpitations. Paroxysmal nocturnal dyspnea. Shortness of breath. Syncope.   SHe has been on dialysis since Nov 2016.  She has had variable compliance with dialysis at times.      Past Medical History:  Diagnosis Date  . Anemia    Iron Def  . Anxiety   . Autism spectrum   . Behavioral problems   . Depression   . End stage renal disease ([place])   . GERD (gastroesophageal reflux disease)   . Hyperlipidemia   . Hypertension   . HYPOTHYROIDISM 03/12/2009   Qualifier: Diagnosis of  By: Earley Favor MD, Cat    . MENTAL RETARDATION 06/23/2006   Qualifier: Diagnosis of  By: Eusebio Friendly    . Mood disorder [facility])    Sees Dr Silvio Pate, who prescribes meds  . Shortness of breath dyspnea    with exertion    Past Surgical History:  Procedure Laterality Date  . AV FISTULA PLACEMENT Left 03/17/2015   Procedure: CREATION OF LEFT BRACHIOCEPHALIC ARTERIOVENOUS (AV) FISTULA ;  Surgeon: Conrad Kings, MD;  Location: [facility];  Service: Vascular;  Laterality: Left;  . CHOLECYSTECTOMY  11/2004  . INSERTION OF DIALYSIS CATHETER Right 03/17/2015   Procedure: INSERTION  OF DIALYSIS CATHETER RIGHT INTERNAL JUIGULAR PLACEMENT;  Surgeon: Conrad Harrisville, MD;  Location: [facility];  Service: Vascular;  Laterality: Right;     Current Outpatient Prescriptions  Medication Sig Dispense Refill  . acetaminophen (TYLENOL) 325 MG tablet Take 2 tablets (650 mg total) by mouth every 6 (six) hours as needed for moderate pain or fever. 30 tablet 0  . ARIPiprazole (ABILIFY) 5 MG tablet Take 5 mg by mouth at bedtime.    . benztropine (COGENTIN) 0.5 MG tablet Take 0.5 mg by mouth daily as needed for tremors.     . busPIRone (BUSPAR) 5 MG tablet Take 5 mg by mouth 3 (three) times daily.     . cinacalcet (SENSIPAR) 30 MG tablet Take 30 mg by mouth daily.    . ferrous sulfate 325 (65 FE) MG tablet Take 1 tablet (325 mg total) by mouth 2 (two) times daily. 180 tablet 3  . lamoTRIgine (LAMICTAL) 150 MG tablet Take 150 mg by mouth at bedtime.  2  . levothyroxine (SYNTHROID, LEVOTHROID) 112 MCG tablet TAKE 1 TABLET DAILY BEFORE BREAKFAST. 30 tablet 12  . omeprazole (PRILOSEC) 20 MG capsule TAKE ONE CAPSULE BY MOUTH EVERY DAY 30 capsule 3  . ondansetron (ZOFRAN) 4 MG tablet Take 1 tablet (4 mg total)  by mouth every 8 (eight) hours as needed for nausea or vomiting. 12 tablet 0  . polyethylene glycol powder (GLYCOLAX/MIRALAX) powder Take 17 g by mouth daily as needed for mild constipation. Reported on 05/26/2015    . sertraline (ZOLOFT) 100 MG tablet Take 100 mg by mouth daily.     . sevelamer carbonate (RENVELA) 800 MG tablet Take 2,400 mg by mouth 3 (three) times daily with meals.     . SUMAtriptan (IMITREX) 25 MG tablet Take 1 tablet (25 mg total) by mouth every 2 (two) hours as needed for migraine. May repeat in 2 hours ONCE if headache persists or recurs. 10 tablet 1   No current facility-administered medications for this visit.     Allergies:   Patient has no known allergies.    Social History:  The patient  reports that she has never smoked. She has never used smokeless tobacco. She  reports that she does not drink alcohol or use drugs.   Family History:  The patient's family history includes Bladder Cancer in her maternal grandmother; Cancer in her maternal grandfather; Diabetes in her maternal grandfather and maternal grandmother; Heart disease in her maternal grandmother; Hypertension in her maternal grandmother and mother. No early CAD.    ROS:  Please see the history of present illness.   Otherwise, review of systems are positive for CP as noted above.   All other systems are reviewed and negative.    PHYSICAL EXAM: VS:  BP 130/88   Pulse 91   Ht 5\' 4"  (1.626 m)   Wt 155 lb 9.6 oz (70.6 kg)   SpO2 99%   BMI 26.71 kg/m  , BMI Body mass index is 26.71 kg/m. GEN: Well nourished, well developed, in no acute distress  HEENT: normal  Neck: no JVD, carotid bruits, or masses Cardiac: RRR; 2/6 systolic murmurs,; no rubs, or gallops,no edema  Respiratory:  clear to auscultation bilaterally, normal work of breathing GI: soft, nontender, nondistended, + BS MS: no deformity or atrophy  Skin: warm and dry, no rash Neuro:  Strength and sensation are intact Psych: euthymic mood, flat affect   EKG:   The ekg ordered in 2/18 demonstrates sinus tachycardia, no ST changes   Recent Labs: 12/22/2015: Platelets 269 05/28/2016: TSH 2.27 06/15/2016: BUN 43; Creatinine, Ser 11.00; Hemoglobin 10.9; Potassium 5.4; Sodium 138   Lipid Panel    Component Value Date/Time   CHOL 176 05/28/2016 0833   TRIG 106 05/28/2016 0833   HDL 60 05/28/2016 0833   CHOLHDL 2.9 05/28/2016 0833   VLDL 21 05/28/2016 0833   LDLCALC 95 05/28/2016 0833     Other studies Reviewed: Additional studies/ records that were reviewed today with results demonstrating: Labs reviewed.   ASSESSMENT AND PLAN:  1. Chest pain: Several atypical features. Not related to activity. We'll check echocardiogram to evaluate for structural heart disease.  The jumping feeling does not seem like palpitations. Continue  to observe.  It is difficult to get specific details about this portion of the history. 2. Murmur: Systolic murmur. May be related to her dialysis fistula. 3. ESRD    Current medicines are reviewed at length with the patient today.  The patient concerns regarding her medicines were addressed.  The following changes have been made:  No change  Labs/ tests ordered today include:  No orders of the defined types were placed in this encounter.   Recommend 150 minutes/week of aerobic exercise Low fat, low carb, high fiber diet recommended  Disposition:  FU in for echo   Signed, Larae Grooms, MD  10/25/2016 8:59 AM    [address], [place], [place]  [address] Phone: [phone]; Fax: [phone]

## 2016-10-25 NOTE — Patient Instructions (Addendum)
Medication Instructions:  Your physician recommends that you continue on your current medications as directed. Please refer to the Current Medication list given to you today.   Labwork: None ordered  Testing/Procedures: Your physician has requested that you have an echocardiogram. Echocardiography is a painless test that uses sound waves to create images of your heart. It provides your doctor with information about the size and shape of your heart and how well your heart's chambers and valves are working. This procedure takes approximately one hour. There are no restrictions for this procedure.  Follow-Up: Your physician recommends that you follow-up AS NEEDED.    Any Other Special Instructions Will Be Listed Below (If Applicable).  Echocardiogram An echocardiogram, or echocardiography, uses sound waves (ultrasound) to produce an image of your heart. The echocardiogram is simple, painless, obtained within a short period of time, and offers valuable information to your health care provider. The images from an echocardiogram can provide information such as:  Evidence of coronary artery disease (CAD).  Heart size.  Heart muscle function.  Heart valve function.  Aneurysm detection.  Evidence of a past heart attack.  Fluid buildup around the heart.  Heart muscle thickening.  Assess heart valve function.  Tell a health care provider about:  Any allergies you have.  All medicines you are taking, including vitamins, herbs, eye drops, creams, and over-the-counter medicines.  Any problems you or family members have had with anesthetic medicines.  Any blood disorders you have.  Any surgeries you have had.  Any medical conditions you have.  Whether you are pregnant or may be pregnant. What happens before the procedure? No special preparation is needed. Eat and drink normally. What happens during the procedure?  In order to produce an image of your heart, gel will be  applied to your chest and a wand-like tool (transducer) will be moved over your chest. The gel will help transmit the sound waves from the transducer. The sound waves will harmlessly bounce off your heart to allow the heart images to be captured in real-time motion. These images will then be recorded.  You may need an IV to receive a medicine that improves the quality of the pictures. What happens after the procedure? You may return to your normal schedule including diet, activities, and medicines, unless your health care provider tells you otherwise. This information is not intended to replace advice given to you by your health care provider. Make sure you discuss any questions you have with your health care provider. Document Released: 04/09/2000 Document Revised: 11/29/2015 Document Reviewed: 12/18/2012 Elsevier Interactive Patient Education  2017 [employer].    If you need a refill on your cardiac medications before your next appointment, please call your pharmacy.

## 2016-10-26 DIAGNOSIS — N186 End stage renal disease: Secondary | ICD-10-CM | POA: Diagnosis not present

## 2016-10-26 DIAGNOSIS — D631 Anemia in chronic kidney disease: Secondary | ICD-10-CM | POA: Diagnosis not present

## 2016-10-26 DIAGNOSIS — N2581 Secondary hyperparathyroidism of renal origin: Secondary | ICD-10-CM | POA: Diagnosis not present

## 2016-10-28 DIAGNOSIS — N186 End stage renal disease: Secondary | ICD-10-CM | POA: Diagnosis not present

## 2016-10-28 DIAGNOSIS — N2581 Secondary hyperparathyroidism of renal origin: Secondary | ICD-10-CM | POA: Diagnosis not present

## 2016-10-28 DIAGNOSIS — D631 Anemia in chronic kidney disease: Secondary | ICD-10-CM | POA: Diagnosis not present

## 2016-10-30 DIAGNOSIS — N186 End stage renal disease: Secondary | ICD-10-CM | POA: Diagnosis not present

## 2016-10-30 DIAGNOSIS — N2581 Secondary hyperparathyroidism of renal origin: Secondary | ICD-10-CM | POA: Diagnosis not present

## 2016-10-30 DIAGNOSIS — D631 Anemia in chronic kidney disease: Secondary | ICD-10-CM | POA: Diagnosis not present

## 2016-11-02 DIAGNOSIS — D631 Anemia in chronic kidney disease: Secondary | ICD-10-CM | POA: Diagnosis not present

## 2016-11-02 DIAGNOSIS — N186 End stage renal disease: Secondary | ICD-10-CM | POA: Diagnosis not present

## 2016-11-02 DIAGNOSIS — N2581 Secondary hyperparathyroidism of renal origin: Secondary | ICD-10-CM | POA: Diagnosis not present

## 2016-11-03 DIAGNOSIS — F71 Moderate intellectual disabilities: Secondary | ICD-10-CM | POA: Diagnosis not present

## 2016-11-03 DIAGNOSIS — F319 Bipolar disorder, unspecified: Secondary | ICD-10-CM | POA: Diagnosis not present

## 2016-11-04 DIAGNOSIS — D631 Anemia in chronic kidney disease: Secondary | ICD-10-CM | POA: Diagnosis not present

## 2016-11-04 DIAGNOSIS — N2581 Secondary hyperparathyroidism of renal origin: Secondary | ICD-10-CM | POA: Diagnosis not present

## 2016-11-04 DIAGNOSIS — N186 End stage renal disease: Secondary | ICD-10-CM | POA: Diagnosis not present

## 2016-11-05 ENCOUNTER — Other Ambulatory Visit: Payer: Self-pay

## 2016-11-05 ENCOUNTER — Ambulatory Visit (HOSPITAL_COMMUNITY): Payer: Medicare Other | Attending: Cardiovascular Disease

## 2016-11-05 ENCOUNTER — Encounter (INDEPENDENT_AMBULATORY_CARE_PROVIDER_SITE_OTHER): Payer: Self-pay

## 2016-11-05 DIAGNOSIS — Z8249 Family history of ischemic heart disease and other diseases of the circulatory system: Secondary | ICD-10-CM | POA: Diagnosis not present

## 2016-11-05 DIAGNOSIS — I371 Nonrheumatic pulmonary valve insufficiency: Secondary | ICD-10-CM | POA: Insufficient documentation

## 2016-11-05 DIAGNOSIS — R0600 Dyspnea, unspecified: Secondary | ICD-10-CM | POA: Insufficient documentation

## 2016-11-05 DIAGNOSIS — R011 Cardiac murmur, unspecified: Secondary | ICD-10-CM | POA: Diagnosis not present

## 2016-11-05 DIAGNOSIS — I081 Rheumatic disorders of both mitral and tricuspid valves: Secondary | ICD-10-CM | POA: Insufficient documentation

## 2016-11-05 DIAGNOSIS — I120 Hypertensive chronic kidney disease with stage 5 chronic kidney disease or end stage renal disease: Secondary | ICD-10-CM | POA: Insufficient documentation

## 2016-11-05 DIAGNOSIS — E785 Hyperlipidemia, unspecified: Secondary | ICD-10-CM | POA: Insufficient documentation

## 2016-11-05 DIAGNOSIS — N186 End stage renal disease: Secondary | ICD-10-CM | POA: Insufficient documentation

## 2016-11-06 DIAGNOSIS — D631 Anemia in chronic kidney disease: Secondary | ICD-10-CM | POA: Diagnosis not present

## 2016-11-06 DIAGNOSIS — N2581 Secondary hyperparathyroidism of renal origin: Secondary | ICD-10-CM | POA: Diagnosis not present

## 2016-11-06 DIAGNOSIS — N186 End stage renal disease: Secondary | ICD-10-CM | POA: Diagnosis not present

## 2016-11-09 DIAGNOSIS — N186 End stage renal disease: Secondary | ICD-10-CM | POA: Diagnosis not present

## 2016-11-09 DIAGNOSIS — D631 Anemia in chronic kidney disease: Secondary | ICD-10-CM | POA: Diagnosis not present

## 2016-11-09 DIAGNOSIS — N2581 Secondary hyperparathyroidism of renal origin: Secondary | ICD-10-CM | POA: Diagnosis not present

## 2016-11-11 DIAGNOSIS — D631 Anemia in chronic kidney disease: Secondary | ICD-10-CM | POA: Diagnosis not present

## 2016-11-11 DIAGNOSIS — N2581 Secondary hyperparathyroidism of renal origin: Secondary | ICD-10-CM | POA: Diagnosis not present

## 2016-11-11 DIAGNOSIS — N186 End stage renal disease: Secondary | ICD-10-CM | POA: Diagnosis not present

## 2016-11-12 ENCOUNTER — Telehealth: Payer: Self-pay | Admitting: Interventional Cardiology

## 2016-11-12 NOTE — Telephone Encounter (Signed)
Patient calling, states that she is returning a call for results. Patient states that you may leave a detailed message.

## 2016-11-12 NOTE — Telephone Encounter (Signed)
-----   Message from Jettie Booze, MD sent at 11/08/2016  1:50 PM EDT ----- Normal LV function.  Adequate valvular function.

## 2016-11-12 NOTE — Telephone Encounter (Signed)
Left detailed message of results on patient's VM (per patient's request). Instructed to call back with any questions.

## 2016-11-13 DIAGNOSIS — N186 End stage renal disease: Secondary | ICD-10-CM | POA: Diagnosis not present

## 2016-11-13 DIAGNOSIS — N2581 Secondary hyperparathyroidism of renal origin: Secondary | ICD-10-CM | POA: Diagnosis not present

## 2016-11-13 DIAGNOSIS — D631 Anemia in chronic kidney disease: Secondary | ICD-10-CM | POA: Diagnosis not present

## 2016-11-16 DIAGNOSIS — N2581 Secondary hyperparathyroidism of renal origin: Secondary | ICD-10-CM | POA: Diagnosis not present

## 2016-11-16 DIAGNOSIS — N186 End stage renal disease: Secondary | ICD-10-CM | POA: Diagnosis not present

## 2016-11-16 DIAGNOSIS — D631 Anemia in chronic kidney disease: Secondary | ICD-10-CM | POA: Diagnosis not present

## 2016-11-18 DIAGNOSIS — D631 Anemia in chronic kidney disease: Secondary | ICD-10-CM | POA: Diagnosis not present

## 2016-11-18 DIAGNOSIS — N2581 Secondary hyperparathyroidism of renal origin: Secondary | ICD-10-CM | POA: Diagnosis not present

## 2016-11-18 DIAGNOSIS — N186 End stage renal disease: Secondary | ICD-10-CM | POA: Diagnosis not present

## 2016-11-20 DIAGNOSIS — N186 End stage renal disease: Secondary | ICD-10-CM | POA: Diagnosis not present

## 2016-11-20 DIAGNOSIS — N2581 Secondary hyperparathyroidism of renal origin: Secondary | ICD-10-CM | POA: Diagnosis not present

## 2016-11-20 DIAGNOSIS — D631 Anemia in chronic kidney disease: Secondary | ICD-10-CM | POA: Diagnosis not present

## 2016-11-23 DIAGNOSIS — Z992 Dependence on renal dialysis: Secondary | ICD-10-CM | POA: Diagnosis not present

## 2016-11-23 DIAGNOSIS — I158 Other secondary hypertension: Secondary | ICD-10-CM | POA: Diagnosis not present

## 2016-11-23 DIAGNOSIS — N2581 Secondary hyperparathyroidism of renal origin: Secondary | ICD-10-CM | POA: Diagnosis not present

## 2016-11-23 DIAGNOSIS — N186 End stage renal disease: Secondary | ICD-10-CM | POA: Diagnosis not present

## 2016-11-23 DIAGNOSIS — D631 Anemia in chronic kidney disease: Secondary | ICD-10-CM | POA: Diagnosis not present

## 2016-11-25 DIAGNOSIS — N2581 Secondary hyperparathyroidism of renal origin: Secondary | ICD-10-CM | POA: Diagnosis not present

## 2016-11-25 DIAGNOSIS — N186 End stage renal disease: Secondary | ICD-10-CM | POA: Diagnosis not present

## 2016-11-25 DIAGNOSIS — D631 Anemia in chronic kidney disease: Secondary | ICD-10-CM | POA: Diagnosis not present

## 2016-11-27 DIAGNOSIS — N2581 Secondary hyperparathyroidism of renal origin: Secondary | ICD-10-CM | POA: Diagnosis not present

## 2016-11-27 DIAGNOSIS — D631 Anemia in chronic kidney disease: Secondary | ICD-10-CM | POA: Diagnosis not present

## 2016-11-27 DIAGNOSIS — N186 End stage renal disease: Secondary | ICD-10-CM | POA: Diagnosis not present

## 2016-11-30 DIAGNOSIS — N186 End stage renal disease: Secondary | ICD-10-CM | POA: Diagnosis not present

## 2016-11-30 DIAGNOSIS — N2581 Secondary hyperparathyroidism of renal origin: Secondary | ICD-10-CM | POA: Diagnosis not present

## 2016-11-30 DIAGNOSIS — D631 Anemia in chronic kidney disease: Secondary | ICD-10-CM | POA: Diagnosis not present

## 2016-12-02 DIAGNOSIS — N2581 Secondary hyperparathyroidism of renal origin: Secondary | ICD-10-CM | POA: Diagnosis not present

## 2016-12-02 DIAGNOSIS — N186 End stage renal disease: Secondary | ICD-10-CM | POA: Diagnosis not present

## 2016-12-02 DIAGNOSIS — D631 Anemia in chronic kidney disease: Secondary | ICD-10-CM | POA: Diagnosis not present

## 2016-12-04 DIAGNOSIS — D631 Anemia in chronic kidney disease: Secondary | ICD-10-CM | POA: Diagnosis not present

## 2016-12-04 DIAGNOSIS — N2581 Secondary hyperparathyroidism of renal origin: Secondary | ICD-10-CM | POA: Diagnosis not present

## 2016-12-04 DIAGNOSIS — N186 End stage renal disease: Secondary | ICD-10-CM | POA: Diagnosis not present

## 2016-12-07 DIAGNOSIS — N186 End stage renal disease: Secondary | ICD-10-CM | POA: Diagnosis not present

## 2016-12-07 DIAGNOSIS — N2581 Secondary hyperparathyroidism of renal origin: Secondary | ICD-10-CM | POA: Diagnosis not present

## 2016-12-07 DIAGNOSIS — D631 Anemia in chronic kidney disease: Secondary | ICD-10-CM | POA: Diagnosis not present

## 2016-12-09 DIAGNOSIS — N2581 Secondary hyperparathyroidism of renal origin: Secondary | ICD-10-CM | POA: Diagnosis not present

## 2016-12-09 DIAGNOSIS — N186 End stage renal disease: Secondary | ICD-10-CM | POA: Diagnosis not present

## 2016-12-09 DIAGNOSIS — D631 Anemia in chronic kidney disease: Secondary | ICD-10-CM | POA: Diagnosis not present

## 2016-12-11 DIAGNOSIS — N2581 Secondary hyperparathyroidism of renal origin: Secondary | ICD-10-CM | POA: Diagnosis not present

## 2016-12-11 DIAGNOSIS — D631 Anemia in chronic kidney disease: Secondary | ICD-10-CM | POA: Diagnosis not present

## 2016-12-11 DIAGNOSIS — N186 End stage renal disease: Secondary | ICD-10-CM | POA: Diagnosis not present

## 2016-12-14 DIAGNOSIS — N186 End stage renal disease: Secondary | ICD-10-CM | POA: Diagnosis not present

## 2016-12-14 DIAGNOSIS — D631 Anemia in chronic kidney disease: Secondary | ICD-10-CM | POA: Diagnosis not present

## 2016-12-14 DIAGNOSIS — N2581 Secondary hyperparathyroidism of renal origin: Secondary | ICD-10-CM | POA: Diagnosis not present

## 2016-12-16 DIAGNOSIS — D631 Anemia in chronic kidney disease: Secondary | ICD-10-CM | POA: Diagnosis not present

## 2016-12-16 DIAGNOSIS — N2581 Secondary hyperparathyroidism of renal origin: Secondary | ICD-10-CM | POA: Diagnosis not present

## 2016-12-16 DIAGNOSIS — N186 End stage renal disease: Secondary | ICD-10-CM | POA: Diagnosis not present

## 2016-12-18 DIAGNOSIS — D631 Anemia in chronic kidney disease: Secondary | ICD-10-CM | POA: Diagnosis not present

## 2016-12-18 DIAGNOSIS — N2581 Secondary hyperparathyroidism of renal origin: Secondary | ICD-10-CM | POA: Diagnosis not present

## 2016-12-18 DIAGNOSIS — N186 End stage renal disease: Secondary | ICD-10-CM | POA: Diagnosis not present

## 2016-12-21 DIAGNOSIS — D631 Anemia in chronic kidney disease: Secondary | ICD-10-CM | POA: Diagnosis not present

## 2016-12-21 DIAGNOSIS — N2581 Secondary hyperparathyroidism of renal origin: Secondary | ICD-10-CM | POA: Diagnosis not present

## 2016-12-21 DIAGNOSIS — N186 End stage renal disease: Secondary | ICD-10-CM | POA: Diagnosis not present

## 2016-12-23 DIAGNOSIS — D631 Anemia in chronic kidney disease: Secondary | ICD-10-CM | POA: Diagnosis not present

## 2016-12-23 DIAGNOSIS — N186 End stage renal disease: Secondary | ICD-10-CM | POA: Diagnosis not present

## 2016-12-23 DIAGNOSIS — N2581 Secondary hyperparathyroidism of renal origin: Secondary | ICD-10-CM | POA: Diagnosis not present

## 2016-12-24 DIAGNOSIS — N186 End stage renal disease: Secondary | ICD-10-CM | POA: Diagnosis not present

## 2016-12-24 DIAGNOSIS — I158 Other secondary hypertension: Secondary | ICD-10-CM | POA: Diagnosis not present

## 2016-12-24 DIAGNOSIS — Z992 Dependence on renal dialysis: Secondary | ICD-10-CM | POA: Diagnosis not present

## 2016-12-25 DIAGNOSIS — D631 Anemia in chronic kidney disease: Secondary | ICD-10-CM | POA: Diagnosis not present

## 2016-12-25 DIAGNOSIS — D509 Iron deficiency anemia, unspecified: Secondary | ICD-10-CM | POA: Diagnosis not present

## 2016-12-25 DIAGNOSIS — Z23 Encounter for immunization: Secondary | ICD-10-CM | POA: Diagnosis not present

## 2016-12-25 DIAGNOSIS — N186 End stage renal disease: Secondary | ICD-10-CM | POA: Diagnosis not present

## 2016-12-25 DIAGNOSIS — N2581 Secondary hyperparathyroidism of renal origin: Secondary | ICD-10-CM | POA: Diagnosis not present

## 2016-12-28 DIAGNOSIS — D631 Anemia in chronic kidney disease: Secondary | ICD-10-CM | POA: Diagnosis not present

## 2016-12-28 DIAGNOSIS — N186 End stage renal disease: Secondary | ICD-10-CM | POA: Diagnosis not present

## 2016-12-28 DIAGNOSIS — Z23 Encounter for immunization: Secondary | ICD-10-CM | POA: Diagnosis not present

## 2016-12-28 DIAGNOSIS — N2581 Secondary hyperparathyroidism of renal origin: Secondary | ICD-10-CM | POA: Diagnosis not present

## 2016-12-28 DIAGNOSIS — D509 Iron deficiency anemia, unspecified: Secondary | ICD-10-CM | POA: Diagnosis not present

## 2016-12-30 DIAGNOSIS — Z23 Encounter for immunization: Secondary | ICD-10-CM | POA: Diagnosis not present

## 2016-12-30 DIAGNOSIS — N186 End stage renal disease: Secondary | ICD-10-CM | POA: Diagnosis not present

## 2016-12-30 DIAGNOSIS — N2581 Secondary hyperparathyroidism of renal origin: Secondary | ICD-10-CM | POA: Diagnosis not present

## 2016-12-30 DIAGNOSIS — D509 Iron deficiency anemia, unspecified: Secondary | ICD-10-CM | POA: Diagnosis not present

## 2016-12-30 DIAGNOSIS — D631 Anemia in chronic kidney disease: Secondary | ICD-10-CM | POA: Diagnosis not present

## 2017-01-01 DIAGNOSIS — D631 Anemia in chronic kidney disease: Secondary | ICD-10-CM | POA: Diagnosis not present

## 2017-01-01 DIAGNOSIS — N2581 Secondary hyperparathyroidism of renal origin: Secondary | ICD-10-CM | POA: Diagnosis not present

## 2017-01-01 DIAGNOSIS — Z23 Encounter for immunization: Secondary | ICD-10-CM | POA: Diagnosis not present

## 2017-01-01 DIAGNOSIS — N186 End stage renal disease: Secondary | ICD-10-CM | POA: Diagnosis not present

## 2017-01-01 DIAGNOSIS — D509 Iron deficiency anemia, unspecified: Secondary | ICD-10-CM | POA: Diagnosis not present

## 2017-01-04 DIAGNOSIS — N2581 Secondary hyperparathyroidism of renal origin: Secondary | ICD-10-CM | POA: Diagnosis not present

## 2017-01-04 DIAGNOSIS — Z23 Encounter for immunization: Secondary | ICD-10-CM | POA: Diagnosis not present

## 2017-01-04 DIAGNOSIS — D631 Anemia in chronic kidney disease: Secondary | ICD-10-CM | POA: Diagnosis not present

## 2017-01-04 DIAGNOSIS — D509 Iron deficiency anemia, unspecified: Secondary | ICD-10-CM | POA: Diagnosis not present

## 2017-01-04 DIAGNOSIS — N186 End stage renal disease: Secondary | ICD-10-CM | POA: Diagnosis not present

## 2017-01-06 DIAGNOSIS — Z23 Encounter for immunization: Secondary | ICD-10-CM | POA: Diagnosis not present

## 2017-01-06 DIAGNOSIS — N2581 Secondary hyperparathyroidism of renal origin: Secondary | ICD-10-CM | POA: Diagnosis not present

## 2017-01-06 DIAGNOSIS — N186 End stage renal disease: Secondary | ICD-10-CM | POA: Diagnosis not present

## 2017-01-06 DIAGNOSIS — D509 Iron deficiency anemia, unspecified: Secondary | ICD-10-CM | POA: Diagnosis not present

## 2017-01-06 DIAGNOSIS — D631 Anemia in chronic kidney disease: Secondary | ICD-10-CM | POA: Diagnosis not present

## 2017-01-08 DIAGNOSIS — D631 Anemia in chronic kidney disease: Secondary | ICD-10-CM | POA: Diagnosis not present

## 2017-01-08 DIAGNOSIS — N186 End stage renal disease: Secondary | ICD-10-CM | POA: Diagnosis not present

## 2017-01-08 DIAGNOSIS — Z23 Encounter for immunization: Secondary | ICD-10-CM | POA: Diagnosis not present

## 2017-01-08 DIAGNOSIS — D509 Iron deficiency anemia, unspecified: Secondary | ICD-10-CM | POA: Diagnosis not present

## 2017-01-08 DIAGNOSIS — N2581 Secondary hyperparathyroidism of renal origin: Secondary | ICD-10-CM | POA: Diagnosis not present

## 2017-01-11 DIAGNOSIS — Z23 Encounter for immunization: Secondary | ICD-10-CM | POA: Diagnosis not present

## 2017-01-11 DIAGNOSIS — D509 Iron deficiency anemia, unspecified: Secondary | ICD-10-CM | POA: Diagnosis not present

## 2017-01-11 DIAGNOSIS — N2581 Secondary hyperparathyroidism of renal origin: Secondary | ICD-10-CM | POA: Diagnosis not present

## 2017-01-11 DIAGNOSIS — D631 Anemia in chronic kidney disease: Secondary | ICD-10-CM | POA: Diagnosis not present

## 2017-01-11 DIAGNOSIS — N186 End stage renal disease: Secondary | ICD-10-CM | POA: Diagnosis not present

## 2017-01-13 DIAGNOSIS — Z23 Encounter for immunization: Secondary | ICD-10-CM | POA: Diagnosis not present

## 2017-01-13 DIAGNOSIS — D631 Anemia in chronic kidney disease: Secondary | ICD-10-CM | POA: Diagnosis not present

## 2017-01-13 DIAGNOSIS — N2581 Secondary hyperparathyroidism of renal origin: Secondary | ICD-10-CM | POA: Diagnosis not present

## 2017-01-13 DIAGNOSIS — D509 Iron deficiency anemia, unspecified: Secondary | ICD-10-CM | POA: Diagnosis not present

## 2017-01-13 DIAGNOSIS — N186 End stage renal disease: Secondary | ICD-10-CM | POA: Diagnosis not present

## 2017-01-15 DIAGNOSIS — D509 Iron deficiency anemia, unspecified: Secondary | ICD-10-CM | POA: Diagnosis not present

## 2017-01-15 DIAGNOSIS — Z23 Encounter for immunization: Secondary | ICD-10-CM | POA: Diagnosis not present

## 2017-01-15 DIAGNOSIS — N186 End stage renal disease: Secondary | ICD-10-CM | POA: Diagnosis not present

## 2017-01-15 DIAGNOSIS — D631 Anemia in chronic kidney disease: Secondary | ICD-10-CM | POA: Diagnosis not present

## 2017-01-15 DIAGNOSIS — N2581 Secondary hyperparathyroidism of renal origin: Secondary | ICD-10-CM | POA: Diagnosis not present

## 2017-01-18 DIAGNOSIS — N2581 Secondary hyperparathyroidism of renal origin: Secondary | ICD-10-CM | POA: Diagnosis not present

## 2017-01-18 DIAGNOSIS — D509 Iron deficiency anemia, unspecified: Secondary | ICD-10-CM | POA: Diagnosis not present

## 2017-01-18 DIAGNOSIS — D631 Anemia in chronic kidney disease: Secondary | ICD-10-CM | POA: Diagnosis not present

## 2017-01-18 DIAGNOSIS — N186 End stage renal disease: Secondary | ICD-10-CM | POA: Diagnosis not present

## 2017-01-18 DIAGNOSIS — Z23 Encounter for immunization: Secondary | ICD-10-CM | POA: Diagnosis not present

## 2017-01-20 DIAGNOSIS — Z23 Encounter for immunization: Secondary | ICD-10-CM | POA: Diagnosis not present

## 2017-01-20 DIAGNOSIS — N186 End stage renal disease: Secondary | ICD-10-CM | POA: Diagnosis not present

## 2017-01-20 DIAGNOSIS — D509 Iron deficiency anemia, unspecified: Secondary | ICD-10-CM | POA: Diagnosis not present

## 2017-01-20 DIAGNOSIS — D631 Anemia in chronic kidney disease: Secondary | ICD-10-CM | POA: Diagnosis not present

## 2017-01-20 DIAGNOSIS — N2581 Secondary hyperparathyroidism of renal origin: Secondary | ICD-10-CM | POA: Diagnosis not present

## 2017-01-21 ENCOUNTER — Other Ambulatory Visit: Payer: Self-pay | Admitting: Family Medicine

## 2017-01-22 DIAGNOSIS — D509 Iron deficiency anemia, unspecified: Secondary | ICD-10-CM | POA: Diagnosis not present

## 2017-01-22 DIAGNOSIS — D631 Anemia in chronic kidney disease: Secondary | ICD-10-CM | POA: Diagnosis not present

## 2017-01-22 DIAGNOSIS — N2581 Secondary hyperparathyroidism of renal origin: Secondary | ICD-10-CM | POA: Diagnosis not present

## 2017-01-22 DIAGNOSIS — Z23 Encounter for immunization: Secondary | ICD-10-CM | POA: Diagnosis not present

## 2017-01-22 DIAGNOSIS — N186 End stage renal disease: Secondary | ICD-10-CM | POA: Diagnosis not present

## 2017-01-23 DIAGNOSIS — I158 Other secondary hypertension: Secondary | ICD-10-CM | POA: Diagnosis not present

## 2017-01-23 DIAGNOSIS — N186 End stage renal disease: Secondary | ICD-10-CM | POA: Diagnosis not present

## 2017-01-23 DIAGNOSIS — Z992 Dependence on renal dialysis: Secondary | ICD-10-CM | POA: Diagnosis not present

## 2017-01-25 DIAGNOSIS — N2581 Secondary hyperparathyroidism of renal origin: Secondary | ICD-10-CM | POA: Diagnosis not present

## 2017-01-25 DIAGNOSIS — D509 Iron deficiency anemia, unspecified: Secondary | ICD-10-CM | POA: Diagnosis not present

## 2017-01-25 DIAGNOSIS — N186 End stage renal disease: Secondary | ICD-10-CM | POA: Diagnosis not present

## 2017-01-25 DIAGNOSIS — D631 Anemia in chronic kidney disease: Secondary | ICD-10-CM | POA: Diagnosis not present

## 2017-01-27 DIAGNOSIS — D631 Anemia in chronic kidney disease: Secondary | ICD-10-CM | POA: Diagnosis not present

## 2017-01-27 DIAGNOSIS — N2581 Secondary hyperparathyroidism of renal origin: Secondary | ICD-10-CM | POA: Diagnosis not present

## 2017-01-27 DIAGNOSIS — D509 Iron deficiency anemia, unspecified: Secondary | ICD-10-CM | POA: Diagnosis not present

## 2017-01-27 DIAGNOSIS — N186 End stage renal disease: Secondary | ICD-10-CM | POA: Diagnosis not present

## 2017-01-29 DIAGNOSIS — N2581 Secondary hyperparathyroidism of renal origin: Secondary | ICD-10-CM | POA: Diagnosis not present

## 2017-01-29 DIAGNOSIS — N186 End stage renal disease: Secondary | ICD-10-CM | POA: Diagnosis not present

## 2017-01-29 DIAGNOSIS — D509 Iron deficiency anemia, unspecified: Secondary | ICD-10-CM | POA: Diagnosis not present

## 2017-01-29 DIAGNOSIS — D631 Anemia in chronic kidney disease: Secondary | ICD-10-CM | POA: Diagnosis not present

## 2017-01-31 DIAGNOSIS — F71 Moderate intellectual disabilities: Secondary | ICD-10-CM | POA: Diagnosis not present

## 2017-01-31 DIAGNOSIS — F319 Bipolar disorder, unspecified: Secondary | ICD-10-CM | POA: Diagnosis not present

## 2017-02-01 DIAGNOSIS — N186 End stage renal disease: Secondary | ICD-10-CM | POA: Diagnosis not present

## 2017-02-01 DIAGNOSIS — D509 Iron deficiency anemia, unspecified: Secondary | ICD-10-CM | POA: Diagnosis not present

## 2017-02-01 DIAGNOSIS — N2581 Secondary hyperparathyroidism of renal origin: Secondary | ICD-10-CM | POA: Diagnosis not present

## 2017-02-01 DIAGNOSIS — D631 Anemia in chronic kidney disease: Secondary | ICD-10-CM | POA: Diagnosis not present

## 2017-02-03 DIAGNOSIS — N186 End stage renal disease: Secondary | ICD-10-CM | POA: Diagnosis not present

## 2017-02-03 DIAGNOSIS — N2581 Secondary hyperparathyroidism of renal origin: Secondary | ICD-10-CM | POA: Diagnosis not present

## 2017-02-03 DIAGNOSIS — D509 Iron deficiency anemia, unspecified: Secondary | ICD-10-CM | POA: Diagnosis not present

## 2017-02-03 DIAGNOSIS — D631 Anemia in chronic kidney disease: Secondary | ICD-10-CM | POA: Diagnosis not present

## 2017-02-05 DIAGNOSIS — D631 Anemia in chronic kidney disease: Secondary | ICD-10-CM | POA: Diagnosis not present

## 2017-02-05 DIAGNOSIS — N186 End stage renal disease: Secondary | ICD-10-CM | POA: Diagnosis not present

## 2017-02-05 DIAGNOSIS — D509 Iron deficiency anemia, unspecified: Secondary | ICD-10-CM | POA: Diagnosis not present

## 2017-02-05 DIAGNOSIS — N2581 Secondary hyperparathyroidism of renal origin: Secondary | ICD-10-CM | POA: Diagnosis not present

## 2017-02-08 ENCOUNTER — Other Ambulatory Visit: Payer: Self-pay | Admitting: Family Medicine

## 2017-02-08 DIAGNOSIS — D631 Anemia in chronic kidney disease: Secondary | ICD-10-CM | POA: Diagnosis not present

## 2017-02-08 DIAGNOSIS — N186 End stage renal disease: Secondary | ICD-10-CM | POA: Diagnosis not present

## 2017-02-08 DIAGNOSIS — D509 Iron deficiency anemia, unspecified: Secondary | ICD-10-CM | POA: Diagnosis not present

## 2017-02-08 DIAGNOSIS — N2581 Secondary hyperparathyroidism of renal origin: Secondary | ICD-10-CM | POA: Diagnosis not present

## 2017-02-09 ENCOUNTER — Other Ambulatory Visit: Payer: Self-pay | Admitting: Family Medicine

## 2017-02-10 DIAGNOSIS — D509 Iron deficiency anemia, unspecified: Secondary | ICD-10-CM | POA: Diagnosis not present

## 2017-02-10 DIAGNOSIS — D631 Anemia in chronic kidney disease: Secondary | ICD-10-CM | POA: Diagnosis not present

## 2017-02-10 DIAGNOSIS — N186 End stage renal disease: Secondary | ICD-10-CM | POA: Diagnosis not present

## 2017-02-10 DIAGNOSIS — N2581 Secondary hyperparathyroidism of renal origin: Secondary | ICD-10-CM | POA: Diagnosis not present

## 2017-02-12 DIAGNOSIS — D631 Anemia in chronic kidney disease: Secondary | ICD-10-CM | POA: Diagnosis not present

## 2017-02-12 DIAGNOSIS — D509 Iron deficiency anemia, unspecified: Secondary | ICD-10-CM | POA: Diagnosis not present

## 2017-02-12 DIAGNOSIS — N2581 Secondary hyperparathyroidism of renal origin: Secondary | ICD-10-CM | POA: Diagnosis not present

## 2017-02-12 DIAGNOSIS — N186 End stage renal disease: Secondary | ICD-10-CM | POA: Diagnosis not present

## 2017-02-13 ENCOUNTER — Other Ambulatory Visit: Payer: Self-pay | Admitting: Family Medicine

## 2017-02-14 ENCOUNTER — Other Ambulatory Visit: Payer: Self-pay | Admitting: Family Medicine

## 2017-02-15 DIAGNOSIS — D631 Anemia in chronic kidney disease: Secondary | ICD-10-CM | POA: Diagnosis not present

## 2017-02-15 DIAGNOSIS — N186 End stage renal disease: Secondary | ICD-10-CM | POA: Diagnosis not present

## 2017-02-15 DIAGNOSIS — N2581 Secondary hyperparathyroidism of renal origin: Secondary | ICD-10-CM | POA: Diagnosis not present

## 2017-02-15 DIAGNOSIS — D509 Iron deficiency anemia, unspecified: Secondary | ICD-10-CM | POA: Diagnosis not present

## 2017-02-17 DIAGNOSIS — D509 Iron deficiency anemia, unspecified: Secondary | ICD-10-CM | POA: Diagnosis not present

## 2017-02-17 DIAGNOSIS — N186 End stage renal disease: Secondary | ICD-10-CM | POA: Diagnosis not present

## 2017-02-17 DIAGNOSIS — D631 Anemia in chronic kidney disease: Secondary | ICD-10-CM | POA: Diagnosis not present

## 2017-02-17 DIAGNOSIS — N2581 Secondary hyperparathyroidism of renal origin: Secondary | ICD-10-CM | POA: Diagnosis not present

## 2017-02-19 DIAGNOSIS — N2581 Secondary hyperparathyroidism of renal origin: Secondary | ICD-10-CM | POA: Diagnosis not present

## 2017-02-19 DIAGNOSIS — D509 Iron deficiency anemia, unspecified: Secondary | ICD-10-CM | POA: Diagnosis not present

## 2017-02-19 DIAGNOSIS — D631 Anemia in chronic kidney disease: Secondary | ICD-10-CM | POA: Diagnosis not present

## 2017-02-19 DIAGNOSIS — N186 End stage renal disease: Secondary | ICD-10-CM | POA: Diagnosis not present

## 2017-02-22 ENCOUNTER — Other Ambulatory Visit: Payer: Self-pay | Admitting: Family Medicine

## 2017-02-22 DIAGNOSIS — D509 Iron deficiency anemia, unspecified: Secondary | ICD-10-CM | POA: Diagnosis not present

## 2017-02-22 DIAGNOSIS — N2581 Secondary hyperparathyroidism of renal origin: Secondary | ICD-10-CM | POA: Diagnosis not present

## 2017-02-22 DIAGNOSIS — D631 Anemia in chronic kidney disease: Secondary | ICD-10-CM | POA: Diagnosis not present

## 2017-02-22 DIAGNOSIS — N186 End stage renal disease: Secondary | ICD-10-CM | POA: Diagnosis not present

## 2017-02-23 DIAGNOSIS — Z992 Dependence on renal dialysis: Secondary | ICD-10-CM | POA: Diagnosis not present

## 2017-02-23 DIAGNOSIS — N186 End stage renal disease: Secondary | ICD-10-CM | POA: Diagnosis not present

## 2017-02-23 DIAGNOSIS — I158 Other secondary hypertension: Secondary | ICD-10-CM | POA: Diagnosis not present

## 2017-02-24 DIAGNOSIS — D631 Anemia in chronic kidney disease: Secondary | ICD-10-CM | POA: Diagnosis not present

## 2017-02-24 DIAGNOSIS — D509 Iron deficiency anemia, unspecified: Secondary | ICD-10-CM | POA: Diagnosis not present

## 2017-02-24 DIAGNOSIS — N2581 Secondary hyperparathyroidism of renal origin: Secondary | ICD-10-CM | POA: Diagnosis not present

## 2017-02-24 DIAGNOSIS — N186 End stage renal disease: Secondary | ICD-10-CM | POA: Diagnosis not present

## 2017-02-26 DIAGNOSIS — D631 Anemia in chronic kidney disease: Secondary | ICD-10-CM | POA: Diagnosis not present

## 2017-02-26 DIAGNOSIS — D509 Iron deficiency anemia, unspecified: Secondary | ICD-10-CM | POA: Diagnosis not present

## 2017-02-26 DIAGNOSIS — N2581 Secondary hyperparathyroidism of renal origin: Secondary | ICD-10-CM | POA: Diagnosis not present

## 2017-02-26 DIAGNOSIS — N186 End stage renal disease: Secondary | ICD-10-CM | POA: Diagnosis not present

## 2017-03-01 DIAGNOSIS — D509 Iron deficiency anemia, unspecified: Secondary | ICD-10-CM | POA: Diagnosis not present

## 2017-03-01 DIAGNOSIS — N186 End stage renal disease: Secondary | ICD-10-CM | POA: Diagnosis not present

## 2017-03-01 DIAGNOSIS — N2581 Secondary hyperparathyroidism of renal origin: Secondary | ICD-10-CM | POA: Diagnosis not present

## 2017-03-01 DIAGNOSIS — D631 Anemia in chronic kidney disease: Secondary | ICD-10-CM | POA: Diagnosis not present

## 2017-03-03 DIAGNOSIS — D631 Anemia in chronic kidney disease: Secondary | ICD-10-CM | POA: Diagnosis not present

## 2017-03-03 DIAGNOSIS — N2581 Secondary hyperparathyroidism of renal origin: Secondary | ICD-10-CM | POA: Diagnosis not present

## 2017-03-03 DIAGNOSIS — N186 End stage renal disease: Secondary | ICD-10-CM | POA: Diagnosis not present

## 2017-03-03 DIAGNOSIS — D509 Iron deficiency anemia, unspecified: Secondary | ICD-10-CM | POA: Diagnosis not present

## 2017-03-05 DIAGNOSIS — N186 End stage renal disease: Secondary | ICD-10-CM | POA: Diagnosis not present

## 2017-03-05 DIAGNOSIS — N2581 Secondary hyperparathyroidism of renal origin: Secondary | ICD-10-CM | POA: Diagnosis not present

## 2017-03-05 DIAGNOSIS — D631 Anemia in chronic kidney disease: Secondary | ICD-10-CM | POA: Diagnosis not present

## 2017-03-05 DIAGNOSIS — D509 Iron deficiency anemia, unspecified: Secondary | ICD-10-CM | POA: Diagnosis not present

## 2017-03-08 DIAGNOSIS — N186 End stage renal disease: Secondary | ICD-10-CM | POA: Diagnosis not present

## 2017-03-08 DIAGNOSIS — N2581 Secondary hyperparathyroidism of renal origin: Secondary | ICD-10-CM | POA: Diagnosis not present

## 2017-03-08 DIAGNOSIS — D509 Iron deficiency anemia, unspecified: Secondary | ICD-10-CM | POA: Diagnosis not present

## 2017-03-08 DIAGNOSIS — D631 Anemia in chronic kidney disease: Secondary | ICD-10-CM | POA: Diagnosis not present

## 2017-03-10 DIAGNOSIS — D631 Anemia in chronic kidney disease: Secondary | ICD-10-CM | POA: Diagnosis not present

## 2017-03-10 DIAGNOSIS — N186 End stage renal disease: Secondary | ICD-10-CM | POA: Diagnosis not present

## 2017-03-10 DIAGNOSIS — D509 Iron deficiency anemia, unspecified: Secondary | ICD-10-CM | POA: Diagnosis not present

## 2017-03-10 DIAGNOSIS — N2581 Secondary hyperparathyroidism of renal origin: Secondary | ICD-10-CM | POA: Diagnosis not present

## 2017-03-10 IMAGING — CR DG ABDOMEN 2V
2 series · 2 of 2 positions shown · non-contrast
Comparison: Radiographs 05/11/2013. Images from CT guided biopsy
12/11/2014.

CLINICAL DATA: Nausea and vomiting with periumbilical pain for 3 or
4 weeks. Irregular bowel movements. Concern for constipation.
Initial encounter.

EXAM:
ABDOMEN - 2 VIEW

[w abdomen upright]
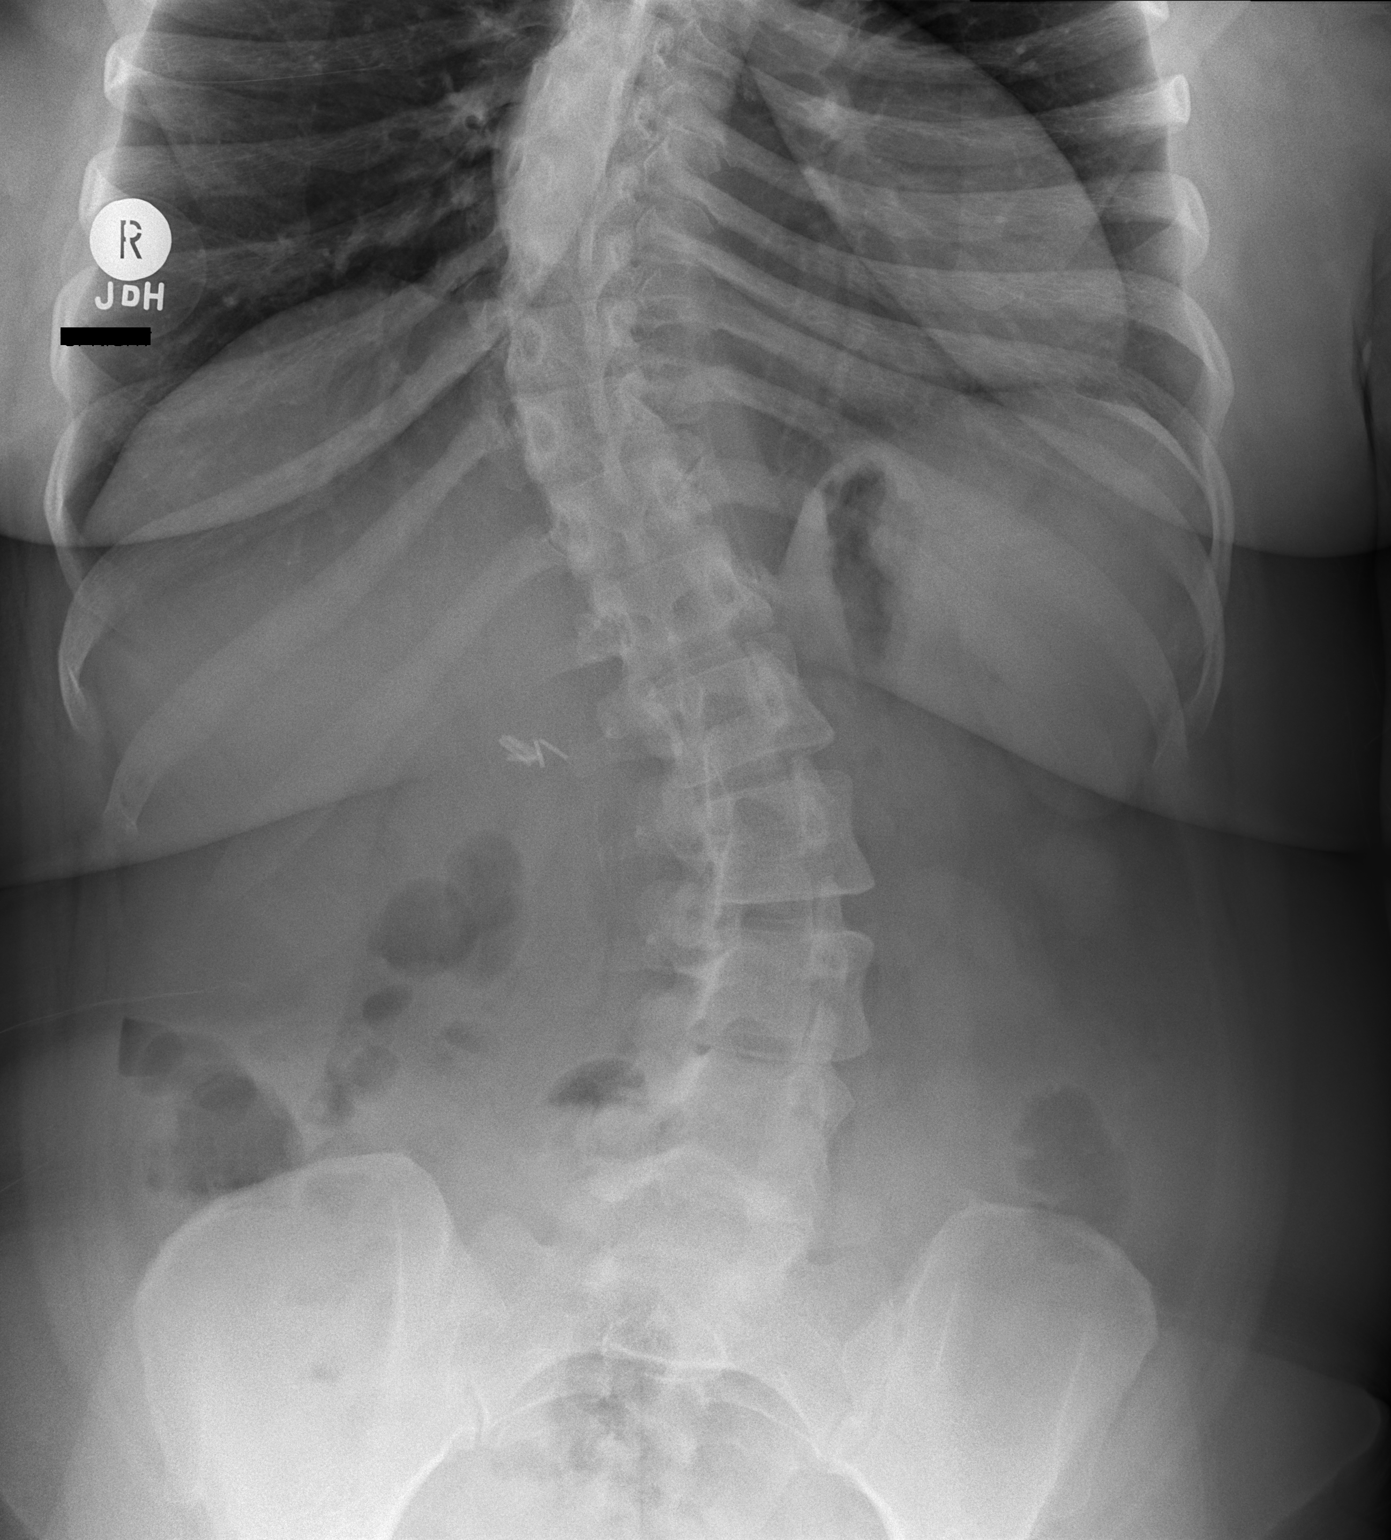

[t abdomen supine]
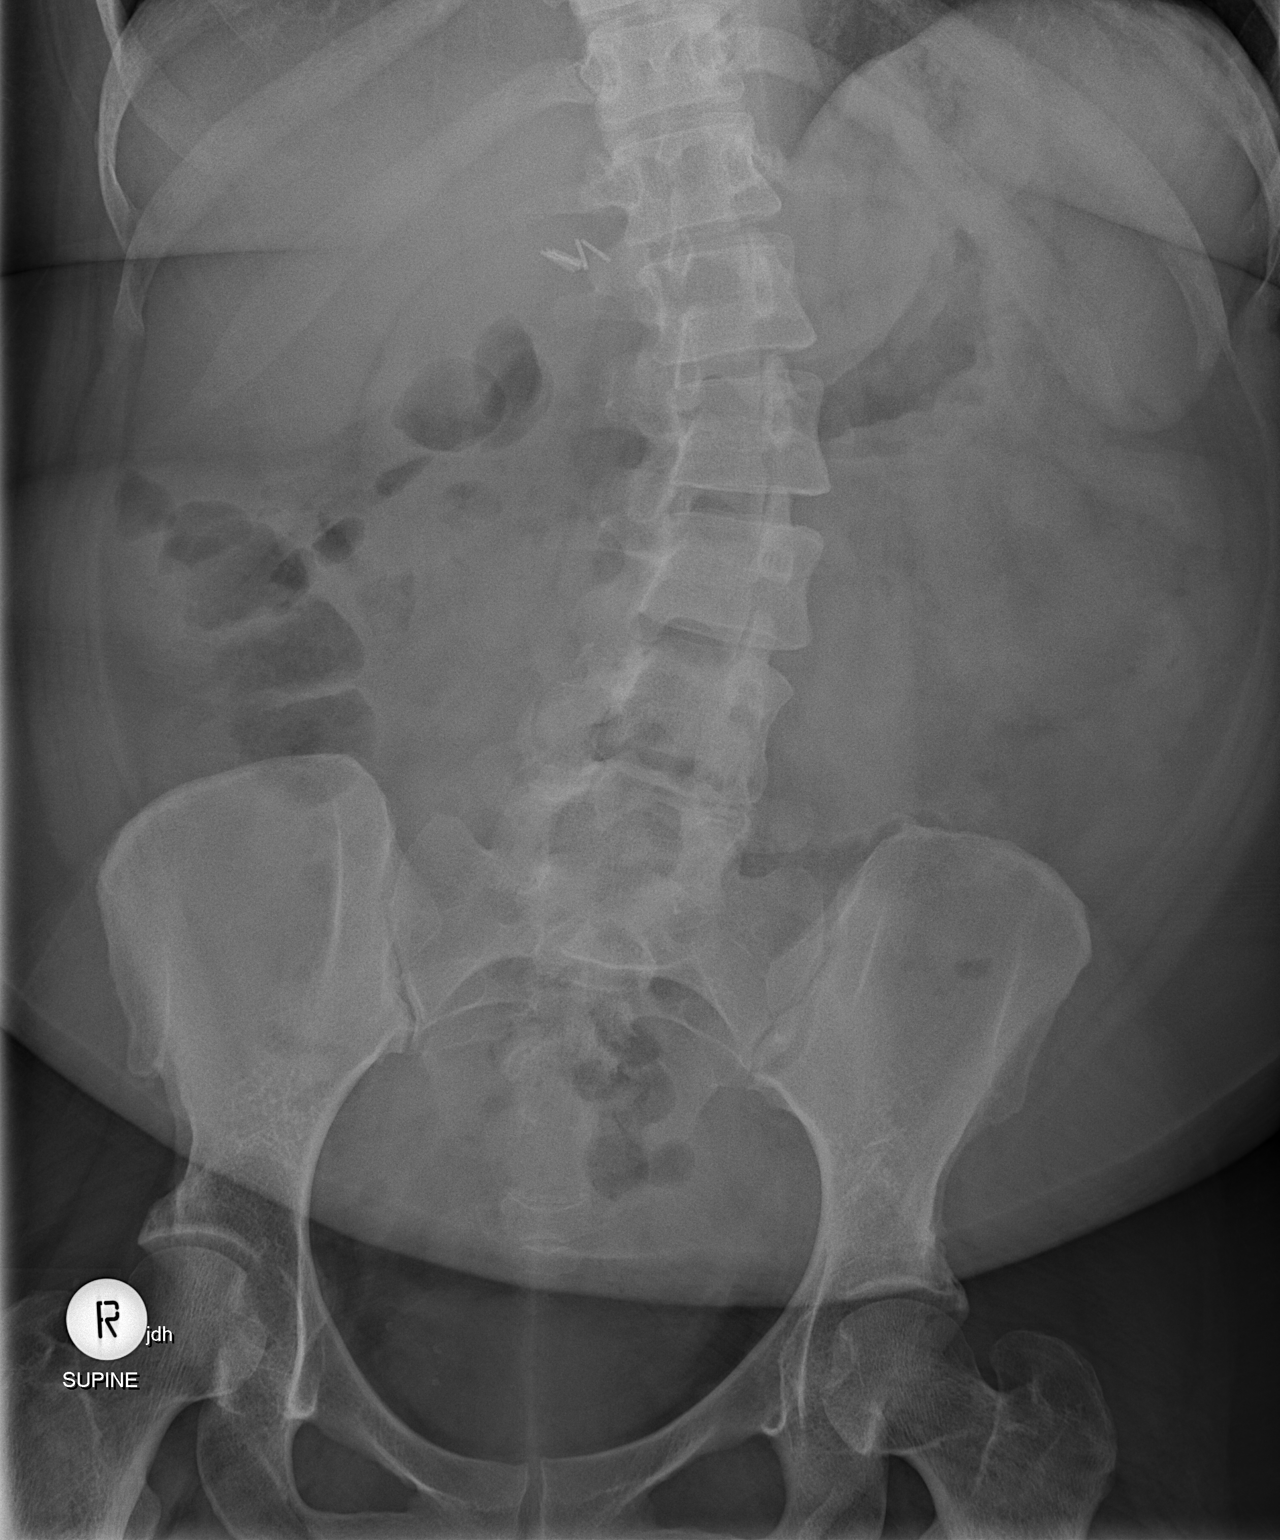

[2 of 2 positions shown; findings below may reference images not displayed]

FINDINGS: The bowel gas pattern is normal. There is no free intraperitoneal
air. Cholecystectomy clips are noted. A severe S shaped
thoracolumbar scoliosis is similar to the prior study.
IMPRESSION: Stable radiographic appearance of the abdomen without evidence of
significant constipation. Scoliosis.

## 2017-03-12 DIAGNOSIS — D509 Iron deficiency anemia, unspecified: Secondary | ICD-10-CM | POA: Diagnosis not present

## 2017-03-12 DIAGNOSIS — N2581 Secondary hyperparathyroidism of renal origin: Secondary | ICD-10-CM | POA: Diagnosis not present

## 2017-03-12 DIAGNOSIS — N186 End stage renal disease: Secondary | ICD-10-CM | POA: Diagnosis not present

## 2017-03-12 DIAGNOSIS — D631 Anemia in chronic kidney disease: Secondary | ICD-10-CM | POA: Diagnosis not present

## 2017-03-14 DIAGNOSIS — N186 End stage renal disease: Secondary | ICD-10-CM | POA: Diagnosis not present

## 2017-03-14 DIAGNOSIS — D509 Iron deficiency anemia, unspecified: Secondary | ICD-10-CM | POA: Diagnosis not present

## 2017-03-14 DIAGNOSIS — N2581 Secondary hyperparathyroidism of renal origin: Secondary | ICD-10-CM | POA: Diagnosis not present

## 2017-03-14 DIAGNOSIS — D631 Anemia in chronic kidney disease: Secondary | ICD-10-CM | POA: Diagnosis not present

## 2017-03-16 DIAGNOSIS — N186 End stage renal disease: Secondary | ICD-10-CM | POA: Diagnosis not present

## 2017-03-16 DIAGNOSIS — N2581 Secondary hyperparathyroidism of renal origin: Secondary | ICD-10-CM | POA: Diagnosis not present

## 2017-03-16 DIAGNOSIS — D509 Iron deficiency anemia, unspecified: Secondary | ICD-10-CM | POA: Diagnosis not present

## 2017-03-16 DIAGNOSIS — D631 Anemia in chronic kidney disease: Secondary | ICD-10-CM | POA: Diagnosis not present

## 2017-03-19 DIAGNOSIS — D631 Anemia in chronic kidney disease: Secondary | ICD-10-CM | POA: Diagnosis not present

## 2017-03-19 DIAGNOSIS — D509 Iron deficiency anemia, unspecified: Secondary | ICD-10-CM | POA: Diagnosis not present

## 2017-03-19 DIAGNOSIS — N2581 Secondary hyperparathyroidism of renal origin: Secondary | ICD-10-CM | POA: Diagnosis not present

## 2017-03-19 DIAGNOSIS — N186 End stage renal disease: Secondary | ICD-10-CM | POA: Diagnosis not present

## 2017-03-22 DIAGNOSIS — N2581 Secondary hyperparathyroidism of renal origin: Secondary | ICD-10-CM | POA: Diagnosis not present

## 2017-03-22 DIAGNOSIS — D509 Iron deficiency anemia, unspecified: Secondary | ICD-10-CM | POA: Diagnosis not present

## 2017-03-22 DIAGNOSIS — D631 Anemia in chronic kidney disease: Secondary | ICD-10-CM | POA: Diagnosis not present

## 2017-03-22 DIAGNOSIS — N186 End stage renal disease: Secondary | ICD-10-CM | POA: Diagnosis not present

## 2017-03-24 DIAGNOSIS — N186 End stage renal disease: Secondary | ICD-10-CM | POA: Diagnosis not present

## 2017-03-24 DIAGNOSIS — D631 Anemia in chronic kidney disease: Secondary | ICD-10-CM | POA: Diagnosis not present

## 2017-03-24 DIAGNOSIS — D509 Iron deficiency anemia, unspecified: Secondary | ICD-10-CM | POA: Diagnosis not present

## 2017-03-24 DIAGNOSIS — N2581 Secondary hyperparathyroidism of renal origin: Secondary | ICD-10-CM | POA: Diagnosis not present

## 2017-03-25 DIAGNOSIS — N186 End stage renal disease: Secondary | ICD-10-CM | POA: Diagnosis not present

## 2017-03-25 DIAGNOSIS — Z992 Dependence on renal dialysis: Secondary | ICD-10-CM | POA: Diagnosis not present

## 2017-03-25 DIAGNOSIS — I158 Other secondary hypertension: Secondary | ICD-10-CM | POA: Diagnosis not present

## 2017-03-26 DIAGNOSIS — N2581 Secondary hyperparathyroidism of renal origin: Secondary | ICD-10-CM | POA: Diagnosis not present

## 2017-03-26 DIAGNOSIS — D509 Iron deficiency anemia, unspecified: Secondary | ICD-10-CM | POA: Diagnosis not present

## 2017-03-26 DIAGNOSIS — D631 Anemia in chronic kidney disease: Secondary | ICD-10-CM | POA: Diagnosis not present

## 2017-03-26 DIAGNOSIS — N186 End stage renal disease: Secondary | ICD-10-CM | POA: Diagnosis not present

## 2017-03-29 DIAGNOSIS — N186 End stage renal disease: Secondary | ICD-10-CM | POA: Diagnosis not present

## 2017-03-29 DIAGNOSIS — N2581 Secondary hyperparathyroidism of renal origin: Secondary | ICD-10-CM | POA: Diagnosis not present

## 2017-03-29 DIAGNOSIS — D509 Iron deficiency anemia, unspecified: Secondary | ICD-10-CM | POA: Diagnosis not present

## 2017-03-29 DIAGNOSIS — D631 Anemia in chronic kidney disease: Secondary | ICD-10-CM | POA: Diagnosis not present

## 2017-03-31 DIAGNOSIS — D631 Anemia in chronic kidney disease: Secondary | ICD-10-CM | POA: Diagnosis not present

## 2017-03-31 DIAGNOSIS — N186 End stage renal disease: Secondary | ICD-10-CM | POA: Diagnosis not present

## 2017-03-31 DIAGNOSIS — D509 Iron deficiency anemia, unspecified: Secondary | ICD-10-CM | POA: Diagnosis not present

## 2017-03-31 DIAGNOSIS — N2581 Secondary hyperparathyroidism of renal origin: Secondary | ICD-10-CM | POA: Diagnosis not present

## 2017-04-02 DIAGNOSIS — N186 End stage renal disease: Secondary | ICD-10-CM | POA: Diagnosis not present

## 2017-04-02 DIAGNOSIS — D631 Anemia in chronic kidney disease: Secondary | ICD-10-CM | POA: Diagnosis not present

## 2017-04-02 DIAGNOSIS — N2581 Secondary hyperparathyroidism of renal origin: Secondary | ICD-10-CM | POA: Diagnosis not present

## 2017-04-02 DIAGNOSIS — D509 Iron deficiency anemia, unspecified: Secondary | ICD-10-CM | POA: Diagnosis not present

## 2017-04-04 ENCOUNTER — Ambulatory Visit: Payer: Medicare Other | Admitting: Family Medicine

## 2017-04-05 DIAGNOSIS — D631 Anemia in chronic kidney disease: Secondary | ICD-10-CM | POA: Diagnosis not present

## 2017-04-05 DIAGNOSIS — D509 Iron deficiency anemia, unspecified: Secondary | ICD-10-CM | POA: Diagnosis not present

## 2017-04-05 DIAGNOSIS — N2581 Secondary hyperparathyroidism of renal origin: Secondary | ICD-10-CM | POA: Diagnosis not present

## 2017-04-05 DIAGNOSIS — N186 End stage renal disease: Secondary | ICD-10-CM | POA: Diagnosis not present

## 2017-04-07 DIAGNOSIS — D509 Iron deficiency anemia, unspecified: Secondary | ICD-10-CM | POA: Diagnosis not present

## 2017-04-07 DIAGNOSIS — D631 Anemia in chronic kidney disease: Secondary | ICD-10-CM | POA: Diagnosis not present

## 2017-04-07 DIAGNOSIS — N2581 Secondary hyperparathyroidism of renal origin: Secondary | ICD-10-CM | POA: Diagnosis not present

## 2017-04-07 DIAGNOSIS — N186 End stage renal disease: Secondary | ICD-10-CM | POA: Diagnosis not present

## 2017-04-09 DIAGNOSIS — N2581 Secondary hyperparathyroidism of renal origin: Secondary | ICD-10-CM | POA: Diagnosis not present

## 2017-04-09 DIAGNOSIS — D509 Iron deficiency anemia, unspecified: Secondary | ICD-10-CM | POA: Diagnosis not present

## 2017-04-09 DIAGNOSIS — N186 End stage renal disease: Secondary | ICD-10-CM | POA: Diagnosis not present

## 2017-04-09 DIAGNOSIS — D631 Anemia in chronic kidney disease: Secondary | ICD-10-CM | POA: Diagnosis not present

## 2017-04-12 DIAGNOSIS — N2581 Secondary hyperparathyroidism of renal origin: Secondary | ICD-10-CM | POA: Diagnosis not present

## 2017-04-12 DIAGNOSIS — N186 End stage renal disease: Secondary | ICD-10-CM | POA: Diagnosis not present

## 2017-04-12 DIAGNOSIS — D631 Anemia in chronic kidney disease: Secondary | ICD-10-CM | POA: Diagnosis not present

## 2017-04-12 DIAGNOSIS — D509 Iron deficiency anemia, unspecified: Secondary | ICD-10-CM | POA: Diagnosis not present

## 2017-04-14 DIAGNOSIS — D509 Iron deficiency anemia, unspecified: Secondary | ICD-10-CM | POA: Diagnosis not present

## 2017-04-14 DIAGNOSIS — N2581 Secondary hyperparathyroidism of renal origin: Secondary | ICD-10-CM | POA: Diagnosis not present

## 2017-04-14 DIAGNOSIS — N186 End stage renal disease: Secondary | ICD-10-CM | POA: Diagnosis not present

## 2017-04-14 DIAGNOSIS — D631 Anemia in chronic kidney disease: Secondary | ICD-10-CM | POA: Diagnosis not present

## 2017-04-16 DIAGNOSIS — N2581 Secondary hyperparathyroidism of renal origin: Secondary | ICD-10-CM | POA: Diagnosis not present

## 2017-04-16 DIAGNOSIS — N186 End stage renal disease: Secondary | ICD-10-CM | POA: Diagnosis not present

## 2017-04-16 DIAGNOSIS — D509 Iron deficiency anemia, unspecified: Secondary | ICD-10-CM | POA: Diagnosis not present

## 2017-04-16 DIAGNOSIS — D631 Anemia in chronic kidney disease: Secondary | ICD-10-CM | POA: Diagnosis not present

## 2017-04-18 DIAGNOSIS — N2581 Secondary hyperparathyroidism of renal origin: Secondary | ICD-10-CM | POA: Diagnosis not present

## 2017-04-18 DIAGNOSIS — N186 End stage renal disease: Secondary | ICD-10-CM | POA: Diagnosis not present

## 2017-04-18 DIAGNOSIS — D509 Iron deficiency anemia, unspecified: Secondary | ICD-10-CM | POA: Diagnosis not present

## 2017-04-18 DIAGNOSIS — D631 Anemia in chronic kidney disease: Secondary | ICD-10-CM | POA: Diagnosis not present

## 2017-04-21 DIAGNOSIS — N186 End stage renal disease: Secondary | ICD-10-CM | POA: Diagnosis not present

## 2017-04-21 DIAGNOSIS — N2581 Secondary hyperparathyroidism of renal origin: Secondary | ICD-10-CM | POA: Diagnosis not present

## 2017-04-21 DIAGNOSIS — D509 Iron deficiency anemia, unspecified: Secondary | ICD-10-CM | POA: Diagnosis not present

## 2017-04-21 DIAGNOSIS — D631 Anemia in chronic kidney disease: Secondary | ICD-10-CM | POA: Diagnosis not present

## 2017-04-23 DIAGNOSIS — D509 Iron deficiency anemia, unspecified: Secondary | ICD-10-CM | POA: Diagnosis not present

## 2017-04-23 DIAGNOSIS — N186 End stage renal disease: Secondary | ICD-10-CM | POA: Diagnosis not present

## 2017-04-23 DIAGNOSIS — N2581 Secondary hyperparathyroidism of renal origin: Secondary | ICD-10-CM | POA: Diagnosis not present

## 2017-04-23 DIAGNOSIS — D631 Anemia in chronic kidney disease: Secondary | ICD-10-CM | POA: Diagnosis not present

## 2017-04-25 DIAGNOSIS — I158 Other secondary hypertension: Secondary | ICD-10-CM | POA: Diagnosis not present

## 2017-04-25 DIAGNOSIS — Z992 Dependence on renal dialysis: Secondary | ICD-10-CM | POA: Diagnosis not present

## 2017-04-25 DIAGNOSIS — D509 Iron deficiency anemia, unspecified: Secondary | ICD-10-CM | POA: Diagnosis not present

## 2017-04-25 DIAGNOSIS — N186 End stage renal disease: Secondary | ICD-10-CM | POA: Diagnosis not present

## 2017-04-25 DIAGNOSIS — N2581 Secondary hyperparathyroidism of renal origin: Secondary | ICD-10-CM | POA: Diagnosis not present

## 2017-04-25 DIAGNOSIS — D631 Anemia in chronic kidney disease: Secondary | ICD-10-CM | POA: Diagnosis not present

## 2017-04-28 DIAGNOSIS — N186 End stage renal disease: Secondary | ICD-10-CM | POA: Diagnosis not present

## 2017-04-28 DIAGNOSIS — N2581 Secondary hyperparathyroidism of renal origin: Secondary | ICD-10-CM | POA: Diagnosis not present

## 2017-04-30 DIAGNOSIS — N2581 Secondary hyperparathyroidism of renal origin: Secondary | ICD-10-CM | POA: Diagnosis not present

## 2017-04-30 DIAGNOSIS — N186 End stage renal disease: Secondary | ICD-10-CM | POA: Diagnosis not present

## 2017-05-03 DIAGNOSIS — N186 End stage renal disease: Secondary | ICD-10-CM | POA: Diagnosis not present

## 2017-05-03 DIAGNOSIS — N2581 Secondary hyperparathyroidism of renal origin: Secondary | ICD-10-CM | POA: Diagnosis not present

## 2017-05-04 ENCOUNTER — Ambulatory Visit (INDEPENDENT_AMBULATORY_CARE_PROVIDER_SITE_OTHER): Payer: Medicare Other | Admitting: Family Medicine

## 2017-05-04 ENCOUNTER — Encounter: Payer: Self-pay | Admitting: Family Medicine

## 2017-05-04 ENCOUNTER — Other Ambulatory Visit: Payer: Self-pay

## 2017-05-04 VITALS — BP 124/68 | HR 87 | Temp 98.6°F | Ht 64.0 in | Wt 145.2 lb

## 2017-05-04 DIAGNOSIS — F39 Unspecified mood [affective] disorder: Secondary | ICD-10-CM

## 2017-05-04 DIAGNOSIS — E039 Hypothyroidism, unspecified: Secondary | ICD-10-CM

## 2017-05-04 DIAGNOSIS — M542 Cervicalgia: Secondary | ICD-10-CM | POA: Diagnosis not present

## 2017-05-04 DIAGNOSIS — K219 Gastro-esophageal reflux disease without esophagitis: Secondary | ICD-10-CM | POA: Diagnosis not present

## 2017-05-04 DIAGNOSIS — N186 End stage renal disease: Secondary | ICD-10-CM | POA: Diagnosis not present

## 2017-05-04 DIAGNOSIS — Z992 Dependence on renal dialysis: Secondary | ICD-10-CM | POA: Diagnosis not present

## 2017-05-04 HISTORY — DX: Cervicalgia: M54.2

## 2017-05-04 MED ORDER — OMEPRAZOLE 20 MG PO CPDR: 20.0000 mg | DELAYED_RELEASE_CAPSULE | Freq: Every day | ORAL | 3 refills | Status: DC

## 2017-05-04 NOTE — Progress Notes (Signed)
Subjective:    Patient ID: Vanessa Alvarez , female   DOB: 1986-03-30 , 32 y.o..   MRN: [id number]  HPI  Vanessa Alvarez is a 32 yo F with PMH of ESRD on HD, hypothyroidism, GERD, mental retardation, HTN here for  Chief Complaint  Patient presents with  . Neck Pain    1. Neck Pain: Patient is endorsing left-sided neck pain for the last couple weeks.  She notes that the neck pain is intermittent and occurs a couple times a week for an hour or so at a time.  Notes that she currently does not have any neck pain.  There is no limitation in her range of motion.  No fevers.  Has not tried any medications for this.  No trauma or injury or inciting event.  2.  ESRD on hemodialysis: Receives hemodialysis Tuesday Thursday and Saturday.  Follows with [facility].  She has a left arm fistula in place.  She has been receiving hemodialysis for the last 2 years at least.  3. Hypothyroidism:Current symptoms: none . Patient denies change in energy level, heat / cold intolerance, nervousness, palpitations and weight changes. Symptoms have been well-controlled.  4. GERD: She patient has been experiencing heartburn for many years.  Symptoms previously controlled with Prilosec but she has not had any medication the last month and heartburn symptoms are returning.. ROS: patient denies abdominal pain, chest pain, wheezing, dysphagia, constipation or fever. Social history: no or minimal alcohol, nonsmoker, no or mild caffeine use.  Review of Systems: Per HPI.   There are no preventive care reminders to display for this patient.  Past Medical History: Patient Active Problem List   Diagnosis Date Noted  . Neck pain 05/04/2017  . GERD (gastroesophageal reflux disease) 05/04/2017  . Involuntary commitment   . Dialysis patient, noncompliant ([place]) 10/16/2015  . Paranoia ([place]) 10/16/2015  . Hyperphosphatemia 07/11/2015  . Affective disorder ([place]) 07/06/2015  . Rectal bleeding 03/04/2015  . FSGS (focal  segmental glomerulosclerosis) 03/04/2015  . ESRD on dialysis ([place])   . Nystagmus 08/22/2013  . 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 Headache 07/13/2011  . ECZEMA, ATOPIC 11/05/2009  . Hypothyroidism 03/12/2009  . Obesity, Class II, BMI 35-39.9 02/26/2009  . Essential hypertension 02/26/2009  . Iron deficiency anemia 05/20/2008  . Anxiety state 06/23/2006  . MENTAL RETARDATION 06/23/2006    Medications: reviewed and updated Current Outpatient Medications  Medication Sig Dispense Refill  . ARIPiprazole (ABILIFY) 5 MG tablet Take 5 mg by mouth at bedtime.    . benztropine (COGENTIN) 0.5 MG tablet Take 0.5 mg by mouth daily as needed for tremors.     . busPIRone (BUSPAR) 5 MG tablet Take 5 mg by mouth 3 (three) times daily.     . cinacalcet (SENSIPAR) 30 MG tablet Take 30 mg by mouth daily.    . ferrous sulfate 325 (65 FE) MG tablet Take 1 tablet (325 mg total) by mouth 2 (two) times daily. 180 tablet 3  . lamoTRIgine (LAMICTAL) 150 MG tablet Take 150 mg by mouth at bedtime.  2  . levothyroxine (SYNTHROID, LEVOTHROID) 112 MCG tablet TAKE 1 TABLET DAILY BEFORE BREAKFAST. 30 tablet 12  . polyethylene glycol powder (GLYCOLAX/MIRALAX) powder Take 17 g by mouth daily as needed for mild constipation. Reported on 05/26/2015    . sertraline (ZOLOFT) 100 MG tablet Take 100 mg by mouth daily.     . sevelamer carbonate (RENVELA) 800 MG tablet Take 2,400 mg by mouth 3 (three) times daily  with meals.     Marland Kitchen acetaminophen (TYLENOL) 325 MG tablet Take 2 tablets (650 mg total) by mouth every 6 (six) hours as needed for moderate pain or fever. (Patient not taking: Reported on 05/04/2017) 30 tablet 0  . omeprazole (PRILOSEC) 20 MG capsule Take 1 capsule (20 mg total) by mouth daily. 30 capsule 3  . SUMAtriptan (IMITREX) 25 MG tablet Take 1 tablet (25 mg total) by mouth every 2 (two) hours as needed for migraine. May repeat in 2 hours ONCE if headache persists or recurs. (Patient not taking: Reported on 05/04/2017) 10  tablet 1   No current facility-administered medications for this visit.     Social Hx:  reports that  has never smoked. she has never used smokeless tobacco.   Objective:   BP 124/68   Pulse 87   Temp 98.6 F (37 C) (Oral)   Ht 5\' 4"  (1.626 m)   Wt 145 lb 3.2 oz (65.9 kg)   LMP 05/01/2017 (Exact Date)   SpO2 99%   BMI 24.92 kg/m  Physical Exam  Gen: NAD, alert, cooperative with exam, well-appearing HEENT: NCAT, PERRL, clear conjunctiva, oropharynx clear, supple neck Cardiac: Regular rate and rhythm, normal S1/S2, referred noises from left arm AV fistula.  Left arm AV fistula with palpable thrill Respiratory: Clear to auscultation bilaterally, no wheezes, non-labored breathing Gastrointestinal: soft, non tender, non distended, bowel sounds present Neck: Normal on inspection, full range of motion in all directions, no tenderness to palpation on bony areas or trapezius.  Left trapezius is slightly more tense than the right. Skin: Dry skin on face and neck Neurological: no gross deficits.   Assessment & Plan:  ESRD on dialysis [facility]) ESRD on hemodialysis Tuesday Thursday Saturday since 2016.  Sees [facility].  Left arm AV fistula intact  Hypothyroidism Asymptomatic without any hyper or hypothyroid symptoms.  TSH normal last year.  Will check TSH again today and continue current Synthroid dose  Neck pain History and exam most consistent with left trapezius strain/tension.  Advised gentle range of motion exercises, heating pads as needed, ibuprofen or Tylenol as needed.  Return precautions discussed  GERD (gastroesophageal reflux disease) History of GERD.  Was previously taking Prilosec but has not had any in the last month and now symptomatic.  Prilosec refilled  Affective disorder [facility]) Patient has many psychiatric medications in Epic. Unclear exactly which mood disorder she has.  Reviewed these and updated them. She follows with [facility] every 3 months.    Orders Placed This Encounter  Procedures  . Basic Metabolic Panel  . CBC  . TSH   Meds ordered this encounter  Medications  . omeprazole (PRILOSEC) 20 MG capsule    Sig: Take 1 capsule (20 mg total) by mouth daily.    Dispense:  30 capsule    Refill:  3    Smitty Cords, MD [place], PGY-3

## 2017-05-04 NOTE — Assessment & Plan Note (Signed)
ESRD on hemodialysis Tuesday Thursday Saturday since 2016.  Sees [facility].  Left arm AV fistula intact

## 2017-05-04 NOTE — Assessment & Plan Note (Signed)
Asymptomatic without any hyper or hypothyroid symptoms.  TSH normal last year.  Will check TSH again today and continue current Synthroid dose

## 2017-05-04 NOTE — Assessment & Plan Note (Addendum)
Patient has many psychiatric medications in Epic. Unclear exactly which mood disorder she has.  Reviewed these and updated them. She follows with [facility] every 3 months.

## 2017-05-04 NOTE — Assessment & Plan Note (Signed)
History and exam most consistent with left trapezius strain/tension.  Advised gentle range of motion exercises, heating pads as needed, ibuprofen or Tylenol as needed.  Return precautions discussed

## 2017-05-04 NOTE — Assessment & Plan Note (Signed)
History of GERD.  Was previously taking Prilosec but has not had any in the last month and now symptomatic.  Prilosec refilled

## 2017-05-04 NOTE — Patient Instructions (Signed)
Thank you for coming in today, it was so nice to see you! Today we talked about:    Neck pain: Your pain is likely from muscle tension on the left side.  The specific muscles, trapezius.  Whenever you get neck pain please use a heating pad on the area.  You can also try ibuprofen or Tylenol to see if this helps.  We going to check your sugar today as well as your hemoglobin and thyroid hormone and electrolytes  Only use Q-tips at the bare minimum, no more than once a week.  Using Q-tips more than this can cause irritation in your ear canal  Please follow up in 6 months for next checkup.   If we ordered any tests today, you will be notified via telephone of any abnormalities. If everything is normal you will get a letter in the mail.   If you have any questions or concerns, please do not hesitate to call the office at [phone]. You can also message me directly via MyChart.   Sincerely,  Smitty Cords, MD

## 2017-05-05 DIAGNOSIS — N2581 Secondary hyperparathyroidism of renal origin: Secondary | ICD-10-CM | POA: Diagnosis not present

## 2017-05-05 DIAGNOSIS — N186 End stage renal disease: Secondary | ICD-10-CM | POA: Diagnosis not present

## 2017-05-05 LAB — BASIC METABOLIC PANEL
BUN/Creatinine Ratio: 4 — ABNORMAL LOW (ref 9–23)
BUN: 25 mg/dL — AB (ref 6–20)
CALCIUM: 9.4 mg/dL (ref 8.7–10.2)
CHLORIDE: 95 mmol/L — AB (ref 96–106)
CO2: 29 mmol/L (ref 20–29)
Creatinine, Ser: 7.13 mg/dL — ABNORMAL HIGH (ref 0.57–1.00)
GFR calc Af Amer: 8 mL/min/{1.73_m2} — ABNORMAL LOW (ref >59)
GFR, EST NON AFRICAN AMERICAN: 7 mL/min/{1.73_m2} — AB (ref >59)
Glucose: 80 mg/dL (ref 65–99)
POTASSIUM: 4.6 mmol/L (ref 3.5–5.2)
Sodium: 141 mmol/L (ref 134–144)

## 2017-05-05 LAB — CBC
HEMATOCRIT: 35.2 % (ref 34.0–46.6)
HEMOGLOBIN: 11.5 g/dL (ref 11.1–15.9)
MCH: 28.6 pg (ref 26.6–33.0)
MCHC: 32.7 g/dL (ref 31.5–35.7)
MCV: 88 fL (ref 79–97)
Platelets: 216 10*3/uL (ref 150–379)
RBC: 4.02 x10E6/uL (ref 3.77–5.28)
RDW: 14.8 % (ref 12.3–15.4)
WBC: 5.1 10*3/uL (ref 3.4–10.8)

## 2017-05-05 LAB — TSH: TSH: 2.2 u[IU]/mL (ref 0.450–4.500)

## 2017-05-07 DIAGNOSIS — N186 End stage renal disease: Secondary | ICD-10-CM | POA: Diagnosis not present

## 2017-05-07 DIAGNOSIS — N2581 Secondary hyperparathyroidism of renal origin: Secondary | ICD-10-CM | POA: Diagnosis not present

## 2017-05-10 DIAGNOSIS — N2581 Secondary hyperparathyroidism of renal origin: Secondary | ICD-10-CM | POA: Diagnosis not present

## 2017-05-10 DIAGNOSIS — N186 End stage renal disease: Secondary | ICD-10-CM | POA: Diagnosis not present

## 2017-05-11 DIAGNOSIS — F329 Major depressive disorder, single episode, unspecified: Secondary | ICD-10-CM | POA: Diagnosis not present

## 2017-05-11 DIAGNOSIS — F71 Moderate intellectual disabilities: Secondary | ICD-10-CM | POA: Diagnosis not present

## 2017-05-12 DIAGNOSIS — N186 End stage renal disease: Secondary | ICD-10-CM | POA: Diagnosis not present

## 2017-05-12 DIAGNOSIS — N2581 Secondary hyperparathyroidism of renal origin: Secondary | ICD-10-CM | POA: Diagnosis not present

## 2017-05-14 DIAGNOSIS — N2581 Secondary hyperparathyroidism of renal origin: Secondary | ICD-10-CM | POA: Diagnosis not present

## 2017-05-14 DIAGNOSIS — N186 End stage renal disease: Secondary | ICD-10-CM | POA: Diagnosis not present

## 2017-05-17 DIAGNOSIS — N2581 Secondary hyperparathyroidism of renal origin: Secondary | ICD-10-CM | POA: Diagnosis not present

## 2017-05-17 DIAGNOSIS — N186 End stage renal disease: Secondary | ICD-10-CM | POA: Diagnosis not present

## 2017-05-19 DIAGNOSIS — N186 End stage renal disease: Secondary | ICD-10-CM | POA: Diagnosis not present

## 2017-05-19 DIAGNOSIS — N2581 Secondary hyperparathyroidism of renal origin: Secondary | ICD-10-CM | POA: Diagnosis not present

## 2017-05-21 DIAGNOSIS — N2581 Secondary hyperparathyroidism of renal origin: Secondary | ICD-10-CM | POA: Diagnosis not present

## 2017-05-21 DIAGNOSIS — N186 End stage renal disease: Secondary | ICD-10-CM | POA: Diagnosis not present

## 2017-05-24 DIAGNOSIS — N186 End stage renal disease: Secondary | ICD-10-CM | POA: Diagnosis not present

## 2017-05-24 DIAGNOSIS — N2581 Secondary hyperparathyroidism of renal origin: Secondary | ICD-10-CM | POA: Diagnosis not present

## 2017-05-26 DIAGNOSIS — N2581 Secondary hyperparathyroidism of renal origin: Secondary | ICD-10-CM | POA: Diagnosis not present

## 2017-05-26 DIAGNOSIS — N186 End stage renal disease: Secondary | ICD-10-CM | POA: Diagnosis not present

## 2017-05-26 DIAGNOSIS — I158 Other secondary hypertension: Secondary | ICD-10-CM | POA: Diagnosis not present

## 2017-05-26 DIAGNOSIS — Z992 Dependence on renal dialysis: Secondary | ICD-10-CM | POA: Diagnosis not present

## 2017-05-27 DIAGNOSIS — I158 Other secondary hypertension: Secondary | ICD-10-CM | POA: Diagnosis not present

## 2017-05-27 DIAGNOSIS — N186 End stage renal disease: Secondary | ICD-10-CM | POA: Diagnosis not present

## 2017-05-27 DIAGNOSIS — Z992 Dependence on renal dialysis: Secondary | ICD-10-CM | POA: Diagnosis not present

## 2017-05-28 DIAGNOSIS — Z992 Dependence on renal dialysis: Secondary | ICD-10-CM | POA: Diagnosis not present

## 2017-05-28 DIAGNOSIS — N2581 Secondary hyperparathyroidism of renal origin: Secondary | ICD-10-CM | POA: Diagnosis not present

## 2017-05-28 DIAGNOSIS — Z23 Encounter for immunization: Secondary | ICD-10-CM | POA: Diagnosis not present

## 2017-05-28 DIAGNOSIS — N186 End stage renal disease: Secondary | ICD-10-CM | POA: Diagnosis not present

## 2017-05-31 DIAGNOSIS — N2581 Secondary hyperparathyroidism of renal origin: Secondary | ICD-10-CM | POA: Diagnosis not present

## 2017-05-31 DIAGNOSIS — Z992 Dependence on renal dialysis: Secondary | ICD-10-CM | POA: Diagnosis not present

## 2017-05-31 DIAGNOSIS — Z23 Encounter for immunization: Secondary | ICD-10-CM | POA: Diagnosis not present

## 2017-05-31 DIAGNOSIS — N186 End stage renal disease: Secondary | ICD-10-CM | POA: Diagnosis not present

## 2017-06-02 DIAGNOSIS — N186 End stage renal disease: Secondary | ICD-10-CM | POA: Diagnosis not present

## 2017-06-02 DIAGNOSIS — N2581 Secondary hyperparathyroidism of renal origin: Secondary | ICD-10-CM | POA: Diagnosis not present

## 2017-06-02 DIAGNOSIS — Z992 Dependence on renal dialysis: Secondary | ICD-10-CM | POA: Diagnosis not present

## 2017-06-02 DIAGNOSIS — Z23 Encounter for immunization: Secondary | ICD-10-CM | POA: Diagnosis not present

## 2017-06-04 DIAGNOSIS — Z992 Dependence on renal dialysis: Secondary | ICD-10-CM | POA: Diagnosis not present

## 2017-06-04 DIAGNOSIS — N186 End stage renal disease: Secondary | ICD-10-CM | POA: Diagnosis not present

## 2017-06-04 DIAGNOSIS — Z23 Encounter for immunization: Secondary | ICD-10-CM | POA: Diagnosis not present

## 2017-06-04 DIAGNOSIS — N2581 Secondary hyperparathyroidism of renal origin: Secondary | ICD-10-CM | POA: Diagnosis not present

## 2017-06-07 DIAGNOSIS — Z23 Encounter for immunization: Secondary | ICD-10-CM | POA: Diagnosis not present

## 2017-06-07 DIAGNOSIS — Z992 Dependence on renal dialysis: Secondary | ICD-10-CM | POA: Diagnosis not present

## 2017-06-07 DIAGNOSIS — N2581 Secondary hyperparathyroidism of renal origin: Secondary | ICD-10-CM | POA: Diagnosis not present

## 2017-06-07 DIAGNOSIS — N186 End stage renal disease: Secondary | ICD-10-CM | POA: Diagnosis not present

## 2017-06-09 DIAGNOSIS — Z992 Dependence on renal dialysis: Secondary | ICD-10-CM | POA: Diagnosis not present

## 2017-06-09 DIAGNOSIS — N186 End stage renal disease: Secondary | ICD-10-CM | POA: Diagnosis not present

## 2017-06-09 DIAGNOSIS — N2581 Secondary hyperparathyroidism of renal origin: Secondary | ICD-10-CM | POA: Diagnosis not present

## 2017-06-09 DIAGNOSIS — Z23 Encounter for immunization: Secondary | ICD-10-CM | POA: Diagnosis not present

## 2017-06-11 DIAGNOSIS — N2581 Secondary hyperparathyroidism of renal origin: Secondary | ICD-10-CM | POA: Diagnosis not present

## 2017-06-11 DIAGNOSIS — Z23 Encounter for immunization: Secondary | ICD-10-CM | POA: Diagnosis not present

## 2017-06-11 DIAGNOSIS — Z992 Dependence on renal dialysis: Secondary | ICD-10-CM | POA: Diagnosis not present

## 2017-06-11 DIAGNOSIS — N186 End stage renal disease: Secondary | ICD-10-CM | POA: Diagnosis not present

## 2017-06-13 ENCOUNTER — Telehealth: Payer: Self-pay

## 2017-06-13 NOTE — Telephone Encounter (Signed)
Called to discuss lab results. All normal with exception of kidney function (known ESRD). Mother appreciative of all. Will send a letter to their home with the results.   Smitty Cords, MD [place], PGY-3

## 2017-06-13 NOTE — Telephone Encounter (Signed)
Mother left message on nurse line that they have not heard about lab results from 05/04/17 visit. Danley Danker, RN [facility] RN)

## 2017-06-14 DIAGNOSIS — Z23 Encounter for immunization: Secondary | ICD-10-CM | POA: Diagnosis not present

## 2017-06-14 DIAGNOSIS — N186 End stage renal disease: Secondary | ICD-10-CM | POA: Diagnosis not present

## 2017-06-14 DIAGNOSIS — Z992 Dependence on renal dialysis: Secondary | ICD-10-CM | POA: Diagnosis not present

## 2017-06-14 DIAGNOSIS — N2581 Secondary hyperparathyroidism of renal origin: Secondary | ICD-10-CM | POA: Diagnosis not present

## 2017-06-16 DIAGNOSIS — Z23 Encounter for immunization: Secondary | ICD-10-CM | POA: Diagnosis not present

## 2017-06-16 DIAGNOSIS — N186 End stage renal disease: Secondary | ICD-10-CM | POA: Diagnosis not present

## 2017-06-16 DIAGNOSIS — N2581 Secondary hyperparathyroidism of renal origin: Secondary | ICD-10-CM | POA: Diagnosis not present

## 2017-06-16 DIAGNOSIS — Z992 Dependence on renal dialysis: Secondary | ICD-10-CM | POA: Diagnosis not present

## 2017-06-18 DIAGNOSIS — N186 End stage renal disease: Secondary | ICD-10-CM | POA: Diagnosis not present

## 2017-06-18 DIAGNOSIS — N2581 Secondary hyperparathyroidism of renal origin: Secondary | ICD-10-CM | POA: Diagnosis not present

## 2017-06-18 DIAGNOSIS — Z23 Encounter for immunization: Secondary | ICD-10-CM | POA: Diagnosis not present

## 2017-06-18 DIAGNOSIS — Z992 Dependence on renal dialysis: Secondary | ICD-10-CM | POA: Diagnosis not present

## 2017-06-21 DIAGNOSIS — Z992 Dependence on renal dialysis: Secondary | ICD-10-CM | POA: Diagnosis not present

## 2017-06-21 DIAGNOSIS — Z23 Encounter for immunization: Secondary | ICD-10-CM | POA: Diagnosis not present

## 2017-06-21 DIAGNOSIS — N2581 Secondary hyperparathyroidism of renal origin: Secondary | ICD-10-CM | POA: Diagnosis not present

## 2017-06-21 DIAGNOSIS — N186 End stage renal disease: Secondary | ICD-10-CM | POA: Diagnosis not present

## 2017-06-23 DIAGNOSIS — N2581 Secondary hyperparathyroidism of renal origin: Secondary | ICD-10-CM | POA: Diagnosis not present

## 2017-06-23 DIAGNOSIS — Z992 Dependence on renal dialysis: Secondary | ICD-10-CM | POA: Diagnosis not present

## 2017-06-23 DIAGNOSIS — Z23 Encounter for immunization: Secondary | ICD-10-CM | POA: Diagnosis not present

## 2017-06-23 DIAGNOSIS — N186 End stage renal disease: Secondary | ICD-10-CM | POA: Diagnosis not present

## 2017-06-24 DIAGNOSIS — Z992 Dependence on renal dialysis: Secondary | ICD-10-CM | POA: Diagnosis not present

## 2017-06-24 DIAGNOSIS — N186 End stage renal disease: Secondary | ICD-10-CM | POA: Diagnosis not present

## 2017-06-24 DIAGNOSIS — I158 Other secondary hypertension: Secondary | ICD-10-CM | POA: Diagnosis not present

## 2017-06-25 DIAGNOSIS — N186 End stage renal disease: Secondary | ICD-10-CM | POA: Diagnosis not present

## 2017-06-25 DIAGNOSIS — N2581 Secondary hyperparathyroidism of renal origin: Secondary | ICD-10-CM | POA: Diagnosis not present

## 2017-06-25 DIAGNOSIS — Z992 Dependence on renal dialysis: Secondary | ICD-10-CM | POA: Diagnosis not present

## 2017-06-28 DIAGNOSIS — Z992 Dependence on renal dialysis: Secondary | ICD-10-CM | POA: Diagnosis not present

## 2017-06-28 DIAGNOSIS — N186 End stage renal disease: Secondary | ICD-10-CM | POA: Diagnosis not present

## 2017-06-28 DIAGNOSIS — N2581 Secondary hyperparathyroidism of renal origin: Secondary | ICD-10-CM | POA: Diagnosis not present

## 2017-06-30 DIAGNOSIS — N186 End stage renal disease: Secondary | ICD-10-CM | POA: Diagnosis not present

## 2017-06-30 DIAGNOSIS — N2581 Secondary hyperparathyroidism of renal origin: Secondary | ICD-10-CM | POA: Diagnosis not present

## 2017-06-30 DIAGNOSIS — Z992 Dependence on renal dialysis: Secondary | ICD-10-CM | POA: Diagnosis not present

## 2017-07-01 DIAGNOSIS — H5213 Myopia, bilateral: Secondary | ICD-10-CM | POA: Diagnosis not present

## 2017-07-01 DIAGNOSIS — H52223 Regular astigmatism, bilateral: Secondary | ICD-10-CM | POA: Diagnosis not present

## 2017-07-01 DIAGNOSIS — H5501 Congenital nystagmus: Secondary | ICD-10-CM | POA: Diagnosis not present

## 2017-07-02 DIAGNOSIS — N2581 Secondary hyperparathyroidism of renal origin: Secondary | ICD-10-CM | POA: Diagnosis not present

## 2017-07-02 DIAGNOSIS — Z992 Dependence on renal dialysis: Secondary | ICD-10-CM | POA: Diagnosis not present

## 2017-07-02 DIAGNOSIS — N186 End stage renal disease: Secondary | ICD-10-CM | POA: Diagnosis not present

## 2017-07-05 DIAGNOSIS — N186 End stage renal disease: Secondary | ICD-10-CM | POA: Diagnosis not present

## 2017-07-05 DIAGNOSIS — N2581 Secondary hyperparathyroidism of renal origin: Secondary | ICD-10-CM | POA: Diagnosis not present

## 2017-07-05 DIAGNOSIS — Z992 Dependence on renal dialysis: Secondary | ICD-10-CM | POA: Diagnosis not present

## 2017-07-06 ENCOUNTER — Other Ambulatory Visit: Payer: Self-pay | Admitting: Family Medicine

## 2017-07-06 DIAGNOSIS — E039 Hypothyroidism, unspecified: Secondary | ICD-10-CM

## 2017-07-07 DIAGNOSIS — N186 End stage renal disease: Secondary | ICD-10-CM | POA: Diagnosis not present

## 2017-07-07 DIAGNOSIS — Z992 Dependence on renal dialysis: Secondary | ICD-10-CM | POA: Diagnosis not present

## 2017-07-07 DIAGNOSIS — N2581 Secondary hyperparathyroidism of renal origin: Secondary | ICD-10-CM | POA: Diagnosis not present

## 2017-07-09 DIAGNOSIS — N2581 Secondary hyperparathyroidism of renal origin: Secondary | ICD-10-CM | POA: Diagnosis not present

## 2017-07-09 DIAGNOSIS — N186 End stage renal disease: Secondary | ICD-10-CM | POA: Diagnosis not present

## 2017-07-09 DIAGNOSIS — Z992 Dependence on renal dialysis: Secondary | ICD-10-CM | POA: Diagnosis not present

## 2017-07-12 DIAGNOSIS — N2581 Secondary hyperparathyroidism of renal origin: Secondary | ICD-10-CM | POA: Diagnosis not present

## 2017-07-12 DIAGNOSIS — Z992 Dependence on renal dialysis: Secondary | ICD-10-CM | POA: Diagnosis not present

## 2017-07-12 DIAGNOSIS — N186 End stage renal disease: Secondary | ICD-10-CM | POA: Diagnosis not present

## 2017-07-13 ENCOUNTER — Other Ambulatory Visit: Payer: Self-pay

## 2017-07-13 MED ORDER — LEVOTHYROXINE SODIUM 112 MCG PO TABS: 112.0000 ug | ORAL_TABLET | Freq: Every day | ORAL | 12 refills | Status: DC

## 2017-07-13 NOTE — Telephone Encounter (Signed)
Pt's mother called nurse line requesting refill of levothyroxine to [facility] [address] Call back [phone] Wallace Cullens, RN

## 2017-07-14 DIAGNOSIS — Z992 Dependence on renal dialysis: Secondary | ICD-10-CM | POA: Diagnosis not present

## 2017-07-14 DIAGNOSIS — N2581 Secondary hyperparathyroidism of renal origin: Secondary | ICD-10-CM | POA: Diagnosis not present

## 2017-07-14 DIAGNOSIS — N186 End stage renal disease: Secondary | ICD-10-CM | POA: Diagnosis not present

## 2017-07-16 DIAGNOSIS — N186 End stage renal disease: Secondary | ICD-10-CM | POA: Diagnosis not present

## 2017-07-16 DIAGNOSIS — N2581 Secondary hyperparathyroidism of renal origin: Secondary | ICD-10-CM | POA: Diagnosis not present

## 2017-07-16 DIAGNOSIS — Z992 Dependence on renal dialysis: Secondary | ICD-10-CM | POA: Diagnosis not present

## 2017-07-19 DIAGNOSIS — N186 End stage renal disease: Secondary | ICD-10-CM | POA: Diagnosis not present

## 2017-07-19 DIAGNOSIS — N2581 Secondary hyperparathyroidism of renal origin: Secondary | ICD-10-CM | POA: Diagnosis not present

## 2017-07-19 DIAGNOSIS — Z992 Dependence on renal dialysis: Secondary | ICD-10-CM | POA: Diagnosis not present

## 2017-07-21 DIAGNOSIS — N186 End stage renal disease: Secondary | ICD-10-CM | POA: Diagnosis not present

## 2017-07-21 DIAGNOSIS — N2581 Secondary hyperparathyroidism of renal origin: Secondary | ICD-10-CM | POA: Diagnosis not present

## 2017-07-21 DIAGNOSIS — Z992 Dependence on renal dialysis: Secondary | ICD-10-CM | POA: Diagnosis not present

## 2017-07-23 DIAGNOSIS — Z992 Dependence on renal dialysis: Secondary | ICD-10-CM | POA: Diagnosis not present

## 2017-07-23 DIAGNOSIS — N2581 Secondary hyperparathyroidism of renal origin: Secondary | ICD-10-CM | POA: Diagnosis not present

## 2017-07-23 DIAGNOSIS — N186 End stage renal disease: Secondary | ICD-10-CM | POA: Diagnosis not present

## 2017-07-25 DIAGNOSIS — D631 Anemia in chronic kidney disease: Secondary | ICD-10-CM | POA: Diagnosis not present

## 2017-07-25 DIAGNOSIS — Z992 Dependence on renal dialysis: Secondary | ICD-10-CM | POA: Diagnosis not present

## 2017-07-25 DIAGNOSIS — N2581 Secondary hyperparathyroidism of renal origin: Secondary | ICD-10-CM | POA: Diagnosis not present

## 2017-07-25 DIAGNOSIS — I158 Other secondary hypertension: Secondary | ICD-10-CM | POA: Diagnosis not present

## 2017-07-25 DIAGNOSIS — N186 End stage renal disease: Secondary | ICD-10-CM | POA: Diagnosis not present

## 2017-07-25 IMAGING — DX DG KNEE COMPLETE 4+V*R*
4 series · 4 of 4 positions shown · non-contrast
Comparison: Radiographs 04/21/2008

CLINICAL DATA: Right knee pain for 2 days, no known acute injury.

EXAM:
RIGHT KNEE - COMPLETE 4+ VIEW

[knee ap]
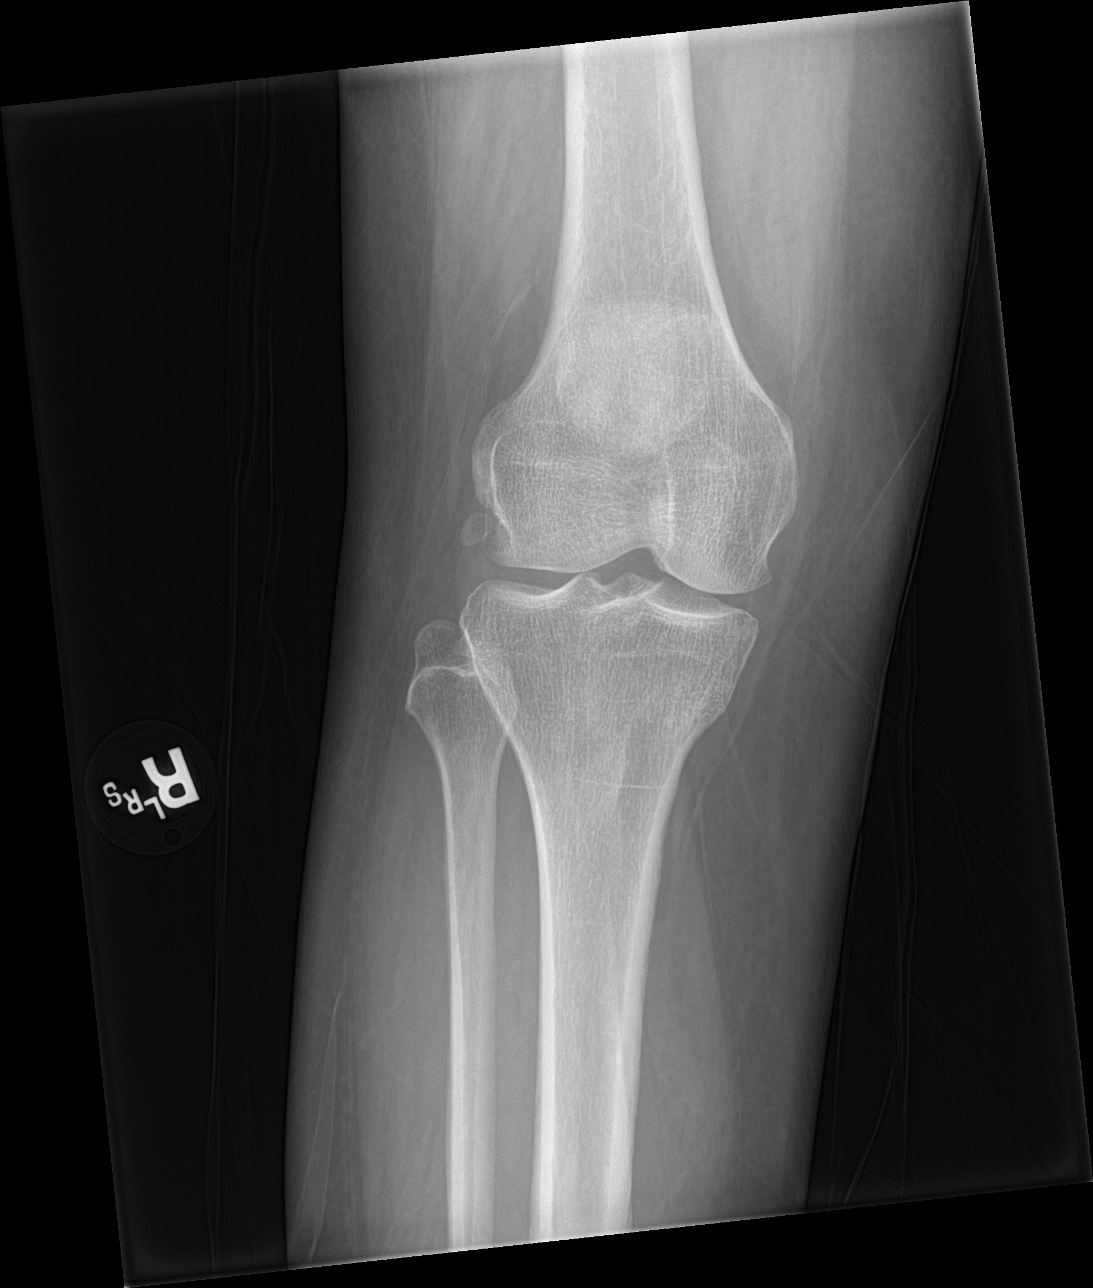

[tunnel]
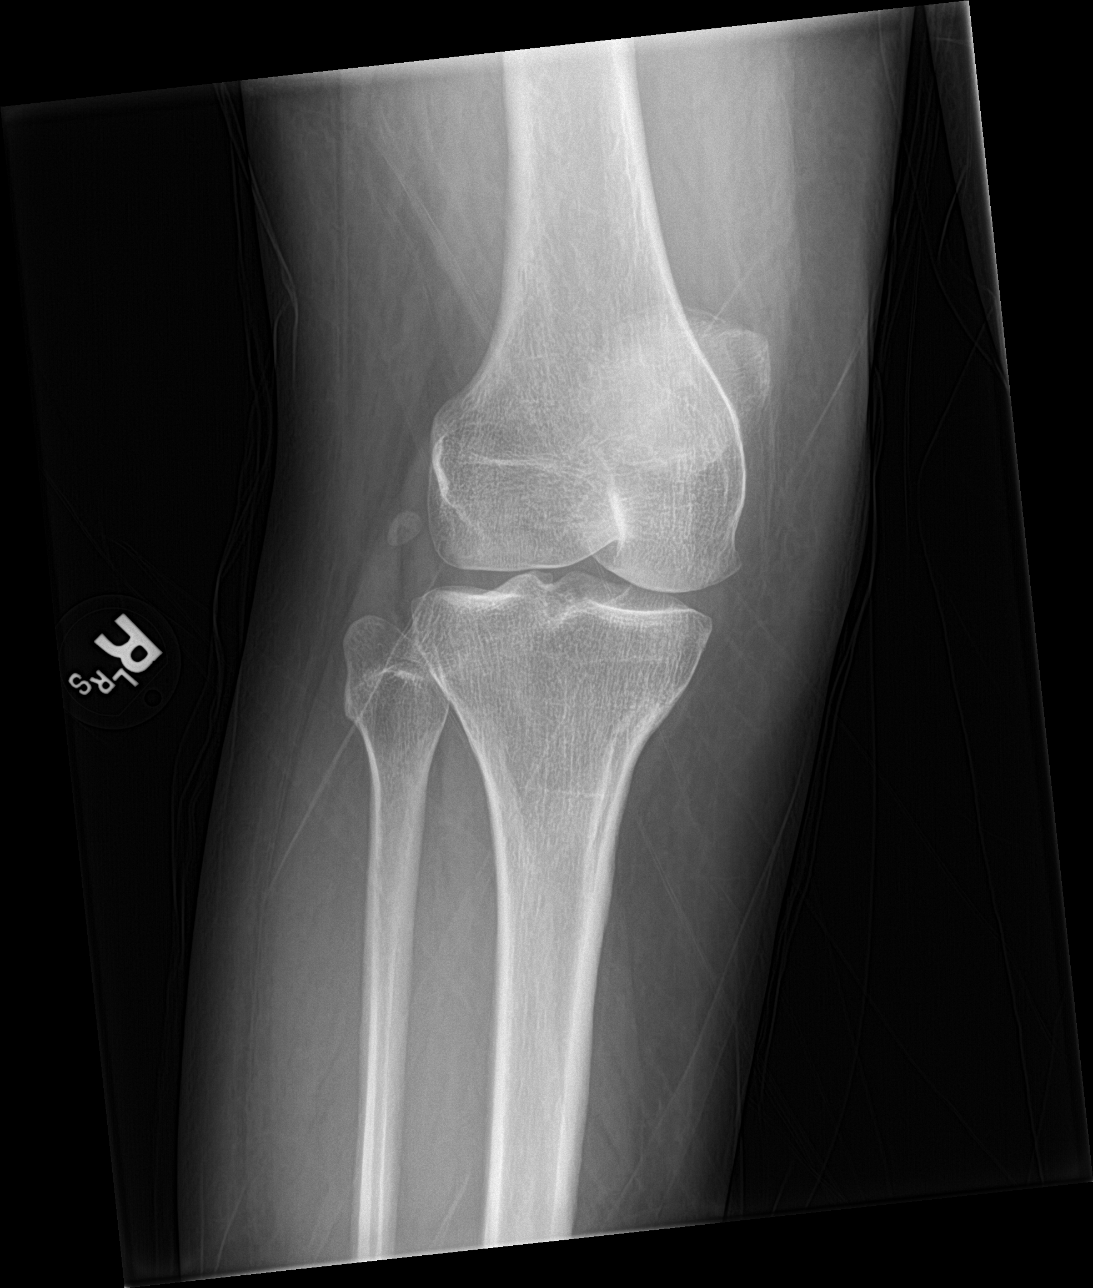

[knee lat]
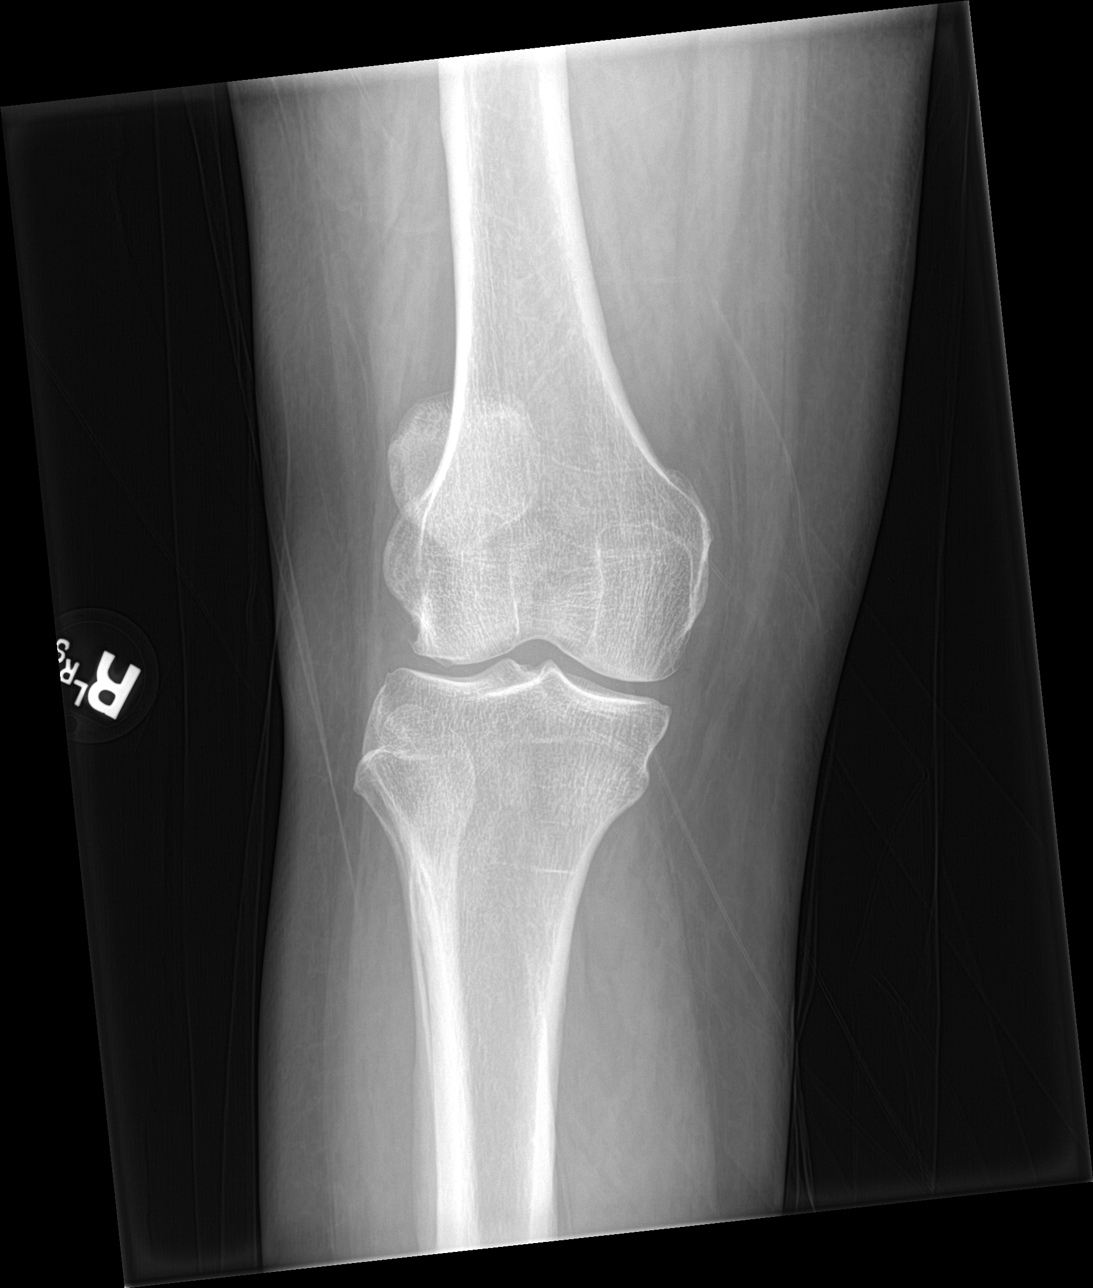

[knee sunrise]
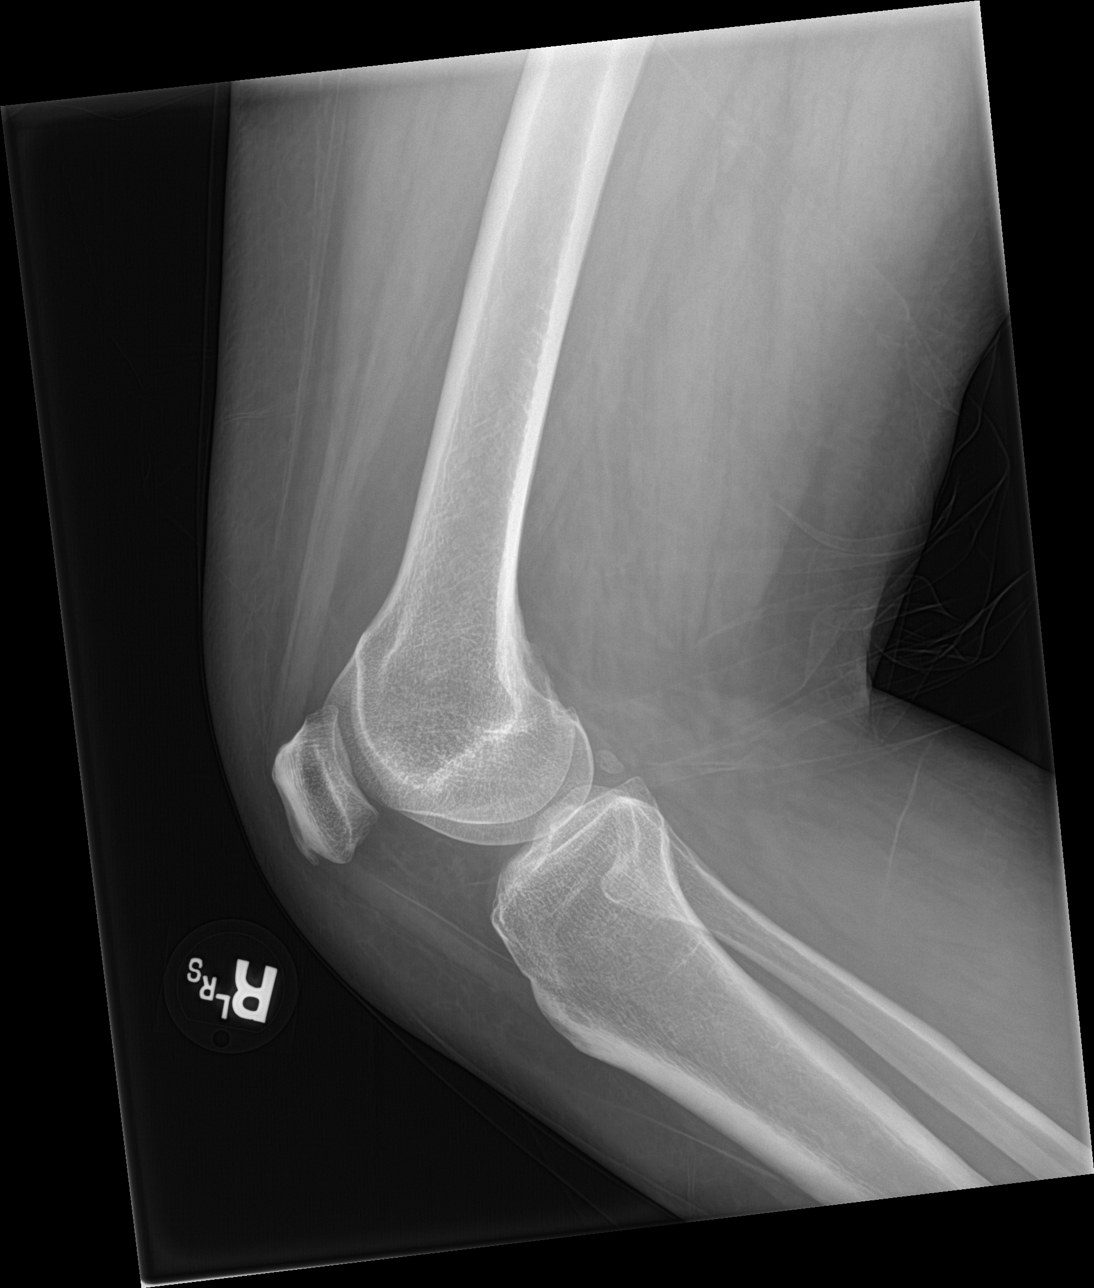

[4 of 4 positions shown; findings below may reference images not displayed]

FINDINGS: No fracture or dislocation. The alignment and joint spaces are
maintained. Early spurring of the medial and lateral tibial femoral
compartments. Small patellar tendon enthesophyte. No joint effusion.
IMPRESSION: No acute abnormality. Minimal peripheral spurring consistent with
early very mild osteoarthritis.

## 2017-07-26 DIAGNOSIS — N186 End stage renal disease: Secondary | ICD-10-CM | POA: Diagnosis not present

## 2017-07-26 DIAGNOSIS — N2581 Secondary hyperparathyroidism of renal origin: Secondary | ICD-10-CM | POA: Diagnosis not present

## 2017-07-26 DIAGNOSIS — Z992 Dependence on renal dialysis: Secondary | ICD-10-CM | POA: Diagnosis not present

## 2017-07-26 DIAGNOSIS — D631 Anemia in chronic kidney disease: Secondary | ICD-10-CM | POA: Diagnosis not present

## 2017-07-27 ENCOUNTER — Ambulatory Visit (INDEPENDENT_AMBULATORY_CARE_PROVIDER_SITE_OTHER): Payer: Medicare Other | Admitting: Family Medicine

## 2017-07-27 ENCOUNTER — Encounter: Payer: Self-pay | Admitting: Family Medicine

## 2017-07-27 ENCOUNTER — Other Ambulatory Visit: Payer: Self-pay

## 2017-07-27 VITALS — BP 118/62 | HR 87 | Temp 98.6°F | Ht 64.0 in | Wt 143.2 lb

## 2017-07-27 DIAGNOSIS — R112 Nausea with vomiting, unspecified: Secondary | ICD-10-CM | POA: Diagnosis present

## 2017-07-27 MED ORDER — ONDANSETRON 4 MG PO TBDP: 4.0000 mg | ORAL_TABLET | Freq: Three times a day (TID) | ORAL | 0 refills | Status: DC | PRN

## 2017-07-27 NOTE — Assessment & Plan Note (Signed)
Complains of 2-3/wk vomiting for months (confirmed by mother).  No abdominal pain related, feels well and not nauseous unless right before vomting.  Usually after laying down after eating, does tend to eat very large meals at a time instead of spreading out daily food intake.  Has not tried medication for this.  Prescribing some zofran tabs to try (ECG did not show QT prolongation), recommending more smaller meals instead of 1-2 large meals and not laying down for >1hr after eating.

## 2017-07-27 NOTE — Patient Instructions (Addendum)
  It was a pleasure to see you today! Thank you for choosing [facility] for your primary care. Vanessa Alvarez was seen for vomiting. Come back to the clinic in 3 weeks to discuss your progress with Dr. Juanito Doom, and go to the emergency room if you notice any large amounts of blood in the vomit.  Today we talked about trying to not eat really large meals as they can increase your odds of feeling nauseous.  We also talked about trying to stay sitting up for a few hours after eating.   We're going to prescribe a few tabs of zofran for times when you are still nauseous to see how it helps.  Only take these when feeling nauseous, only 1 every 6 hours.    If we did any lab work today, and the results require attention, either me or my nurse will get in touch with you. If everything is normal, you will get a letter in mail and a message via . If you don't hear from [place] in two weeks, please give [place] a call. Otherwise, we look forward to seeing you again at your next visit. If you have any questions or concerns before then, please call the clinic at [phone].  Please bring all your medications to every doctors visit  Sign up for My Chart to have easy access to your labs results, and communication with your Primary care physician.    Please check-out at the front desk before leaving the clinic.    Best,  Dr. Sherene Sires FAMILY MEDICINE RESIDENT - PGY1 07/27/2017 11:29 AM

## 2017-07-27 NOTE — Progress Notes (Signed)
    Subjective:  Vanessa Alvarez is a 32 y.o. female who presents to the [facility] today with a chief complaint of vomiting.   Complains of 2-3/wk vomiting for months (confirmed by mother).  No abdominal pain related, feels well and not nauseous unless right before vomting.  Usually after laying down after eating, does tend to eat very large meals at a time instead of spreading out daily food intake.  Has not tried medication for this.  There has been no sign of blood, no sore throat, no recent fevers, no belly pain, no diarrhea/constiiptation, no chest pain/SOB  Objective:  Physical Exam: BP 118/62   Pulse 87   Temp 98.6 F (37 C) (Oral)   Ht 5\' 4"  (1.626 m)   Wt 143 lb 3.2 oz (65 kg)   LMP 07/08/2017 (Approximate)   SpO2 99%   BMI 24.58 kg/m   Gen: NAD, conversing comfortably, cognitively impaired CV: RRR (likely murmur sound was referred fistula) with no murmurs appreciated, fistula covered by sweater Pulm: NWOB, CTAB with no crackles, wheezes, or rhonchi GI: Normal bowel sounds present. Soft, Nontender, Nondistended. MSK: no edema, cyanosis, or clubbing noted Skin: warm, dry Neuro: grossly normal, moves all extremities Psych: Normal affect and thought content  No results found for this or any previous visit (from the past 72 hour(s)).   Assessment/Plan:  Non-intractable vomiting with nausea Complains of 2-3/wk vomiting for months (confirmed by mother).  No abdominal pain related, feels well and not nauseous unless right before vomting.  Usually after laying down after eating, does tend to eat very large meals at a time instead of spreading out daily food intake.  Has not tried medication for this.  Prescribing some zofran tabs to try (ECG did not show QT prolongation), recommending more smaller meals instead of 1-2 large meals and not laying down for >1hr after eating.   Sherene Sires, DO FAMILY MEDICINE RESIDENT - PGY1 07/27/2017 1:47 PM

## 2017-07-28 DIAGNOSIS — Z992 Dependence on renal dialysis: Secondary | ICD-10-CM | POA: Diagnosis not present

## 2017-07-28 DIAGNOSIS — N2581 Secondary hyperparathyroidism of renal origin: Secondary | ICD-10-CM | POA: Diagnosis not present

## 2017-07-28 DIAGNOSIS — N186 End stage renal disease: Secondary | ICD-10-CM | POA: Diagnosis not present

## 2017-07-28 DIAGNOSIS — D631 Anemia in chronic kidney disease: Secondary | ICD-10-CM | POA: Diagnosis not present

## 2017-07-30 DIAGNOSIS — D631 Anemia in chronic kidney disease: Secondary | ICD-10-CM | POA: Diagnosis not present

## 2017-07-30 DIAGNOSIS — N2581 Secondary hyperparathyroidism of renal origin: Secondary | ICD-10-CM | POA: Diagnosis not present

## 2017-07-30 DIAGNOSIS — N186 End stage renal disease: Secondary | ICD-10-CM | POA: Diagnosis not present

## 2017-07-30 DIAGNOSIS — Z992 Dependence on renal dialysis: Secondary | ICD-10-CM | POA: Diagnosis not present

## 2017-08-01 DIAGNOSIS — F71 Moderate intellectual disabilities: Secondary | ICD-10-CM | POA: Diagnosis not present

## 2017-08-01 DIAGNOSIS — F319 Bipolar disorder, unspecified: Secondary | ICD-10-CM | POA: Diagnosis not present

## 2017-08-02 DIAGNOSIS — D631 Anemia in chronic kidney disease: Secondary | ICD-10-CM | POA: Diagnosis not present

## 2017-08-02 DIAGNOSIS — N2581 Secondary hyperparathyroidism of renal origin: Secondary | ICD-10-CM | POA: Diagnosis not present

## 2017-08-02 DIAGNOSIS — N186 End stage renal disease: Secondary | ICD-10-CM | POA: Diagnosis not present

## 2017-08-02 DIAGNOSIS — Z992 Dependence on renal dialysis: Secondary | ICD-10-CM | POA: Diagnosis not present

## 2017-08-04 DIAGNOSIS — Z992 Dependence on renal dialysis: Secondary | ICD-10-CM | POA: Diagnosis not present

## 2017-08-04 DIAGNOSIS — N2581 Secondary hyperparathyroidism of renal origin: Secondary | ICD-10-CM | POA: Diagnosis not present

## 2017-08-04 DIAGNOSIS — N186 End stage renal disease: Secondary | ICD-10-CM | POA: Diagnosis not present

## 2017-08-04 DIAGNOSIS — D631 Anemia in chronic kidney disease: Secondary | ICD-10-CM | POA: Diagnosis not present

## 2017-08-05 ENCOUNTER — Ambulatory Visit: Payer: Medicare Other | Admitting: Family Medicine

## 2017-08-06 DIAGNOSIS — N2581 Secondary hyperparathyroidism of renal origin: Secondary | ICD-10-CM | POA: Diagnosis not present

## 2017-08-06 DIAGNOSIS — D631 Anemia in chronic kidney disease: Secondary | ICD-10-CM | POA: Diagnosis not present

## 2017-08-06 DIAGNOSIS — Z992 Dependence on renal dialysis: Secondary | ICD-10-CM | POA: Diagnosis not present

## 2017-08-06 DIAGNOSIS — N186 End stage renal disease: Secondary | ICD-10-CM | POA: Diagnosis not present

## 2017-08-09 DIAGNOSIS — D631 Anemia in chronic kidney disease: Secondary | ICD-10-CM | POA: Diagnosis not present

## 2017-08-09 DIAGNOSIS — N2581 Secondary hyperparathyroidism of renal origin: Secondary | ICD-10-CM | POA: Diagnosis not present

## 2017-08-09 DIAGNOSIS — Z992 Dependence on renal dialysis: Secondary | ICD-10-CM | POA: Diagnosis not present

## 2017-08-09 DIAGNOSIS — N186 End stage renal disease: Secondary | ICD-10-CM | POA: Diagnosis not present

## 2017-08-11 DIAGNOSIS — N186 End stage renal disease: Secondary | ICD-10-CM | POA: Diagnosis not present

## 2017-08-11 DIAGNOSIS — D631 Anemia in chronic kidney disease: Secondary | ICD-10-CM | POA: Diagnosis not present

## 2017-08-11 DIAGNOSIS — Z992 Dependence on renal dialysis: Secondary | ICD-10-CM | POA: Diagnosis not present

## 2017-08-11 DIAGNOSIS — N2581 Secondary hyperparathyroidism of renal origin: Secondary | ICD-10-CM | POA: Diagnosis not present

## 2017-08-13 DIAGNOSIS — N2581 Secondary hyperparathyroidism of renal origin: Secondary | ICD-10-CM | POA: Diagnosis not present

## 2017-08-13 DIAGNOSIS — Z992 Dependence on renal dialysis: Secondary | ICD-10-CM | POA: Diagnosis not present

## 2017-08-13 DIAGNOSIS — N186 End stage renal disease: Secondary | ICD-10-CM | POA: Diagnosis not present

## 2017-08-13 DIAGNOSIS — D631 Anemia in chronic kidney disease: Secondary | ICD-10-CM | POA: Diagnosis not present

## 2017-08-16 DIAGNOSIS — N186 End stage renal disease: Secondary | ICD-10-CM | POA: Diagnosis not present

## 2017-08-16 DIAGNOSIS — Z992 Dependence on renal dialysis: Secondary | ICD-10-CM | POA: Diagnosis not present

## 2017-08-16 DIAGNOSIS — N2581 Secondary hyperparathyroidism of renal origin: Secondary | ICD-10-CM | POA: Diagnosis not present

## 2017-08-16 DIAGNOSIS — D631 Anemia in chronic kidney disease: Secondary | ICD-10-CM | POA: Diagnosis not present

## 2017-08-17 ENCOUNTER — Ambulatory Visit: Payer: Medicare Other | Admitting: Family Medicine

## 2017-08-18 DIAGNOSIS — N186 End stage renal disease: Secondary | ICD-10-CM | POA: Diagnosis not present

## 2017-08-18 DIAGNOSIS — N2581 Secondary hyperparathyroidism of renal origin: Secondary | ICD-10-CM | POA: Diagnosis not present

## 2017-08-18 DIAGNOSIS — Z992 Dependence on renal dialysis: Secondary | ICD-10-CM | POA: Diagnosis not present

## 2017-08-18 DIAGNOSIS — D631 Anemia in chronic kidney disease: Secondary | ICD-10-CM | POA: Diagnosis not present

## 2017-08-20 DIAGNOSIS — D631 Anemia in chronic kidney disease: Secondary | ICD-10-CM | POA: Diagnosis not present

## 2017-08-20 DIAGNOSIS — Z992 Dependence on renal dialysis: Secondary | ICD-10-CM | POA: Diagnosis not present

## 2017-08-20 DIAGNOSIS — N2581 Secondary hyperparathyroidism of renal origin: Secondary | ICD-10-CM | POA: Diagnosis not present

## 2017-08-20 DIAGNOSIS — N186 End stage renal disease: Secondary | ICD-10-CM | POA: Diagnosis not present

## 2017-08-23 DIAGNOSIS — N2581 Secondary hyperparathyroidism of renal origin: Secondary | ICD-10-CM | POA: Diagnosis not present

## 2017-08-23 DIAGNOSIS — D631 Anemia in chronic kidney disease: Secondary | ICD-10-CM | POA: Diagnosis not present

## 2017-08-23 DIAGNOSIS — N186 End stage renal disease: Secondary | ICD-10-CM | POA: Diagnosis not present

## 2017-08-23 DIAGNOSIS — Z992 Dependence on renal dialysis: Secondary | ICD-10-CM | POA: Diagnosis not present

## 2017-08-24 DIAGNOSIS — N186 End stage renal disease: Secondary | ICD-10-CM | POA: Diagnosis not present

## 2017-08-24 DIAGNOSIS — Z992 Dependence on renal dialysis: Secondary | ICD-10-CM | POA: Diagnosis not present

## 2017-08-24 DIAGNOSIS — I158 Other secondary hypertension: Secondary | ICD-10-CM | POA: Diagnosis not present

## 2017-08-25 DIAGNOSIS — N186 End stage renal disease: Secondary | ICD-10-CM | POA: Diagnosis not present

## 2017-08-25 DIAGNOSIS — N2581 Secondary hyperparathyroidism of renal origin: Secondary | ICD-10-CM | POA: Diagnosis not present

## 2017-08-25 DIAGNOSIS — Z992 Dependence on renal dialysis: Secondary | ICD-10-CM | POA: Diagnosis not present

## 2017-08-27 DIAGNOSIS — Z992 Dependence on renal dialysis: Secondary | ICD-10-CM | POA: Diagnosis not present

## 2017-08-27 DIAGNOSIS — N186 End stage renal disease: Secondary | ICD-10-CM | POA: Diagnosis not present

## 2017-08-27 DIAGNOSIS — N2581 Secondary hyperparathyroidism of renal origin: Secondary | ICD-10-CM | POA: Diagnosis not present

## 2017-08-30 DIAGNOSIS — N2581 Secondary hyperparathyroidism of renal origin: Secondary | ICD-10-CM | POA: Diagnosis not present

## 2017-08-30 DIAGNOSIS — Z992 Dependence on renal dialysis: Secondary | ICD-10-CM | POA: Diagnosis not present

## 2017-08-30 DIAGNOSIS — N186 End stage renal disease: Secondary | ICD-10-CM | POA: Diagnosis not present

## 2017-08-31 ENCOUNTER — Ambulatory Visit (INDEPENDENT_AMBULATORY_CARE_PROVIDER_SITE_OTHER): Payer: Medicare Other | Admitting: Family Medicine

## 2017-08-31 ENCOUNTER — Encounter: Payer: Self-pay | Admitting: Family Medicine

## 2017-08-31 ENCOUNTER — Other Ambulatory Visit: Payer: Self-pay

## 2017-08-31 VITALS — BP 122/68 | HR 85 | Temp 98.5°F | Ht 64.0 in | Wt 139.8 lb

## 2017-08-31 DIAGNOSIS — R112 Nausea with vomiting, unspecified: Secondary | ICD-10-CM | POA: Diagnosis not present

## 2017-08-31 DIAGNOSIS — M25511 Pain in right shoulder: Secondary | ICD-10-CM | POA: Diagnosis present

## 2017-08-31 DIAGNOSIS — R21 Rash and other nonspecific skin eruption: Secondary | ICD-10-CM | POA: Diagnosis not present

## 2017-08-31 NOTE — Patient Instructions (Signed)
Thank you for coming in today, it was so nice to see you! Today we talked about:    Right shoulder pain: This is likely a muscle strain. You will need to apply heat to the area a couple times a day to relax the muscle. You can try Tylenol as needed for pain.   Rash on neck: Try selsun blue shampoo on the area. Apply a layer on the skin where you have a rash, leave on for 5-10 minutes, then wash off in the shower. Do this until the rash gets better  Please follow up in 3 weeks if no improvement in your shoulder or your rash. You can schedule this appointment at the front desk before you leave or call the clinic.  If you have any questions or concerns, please do not hesitate to call the office at [phone]. You can also message me directly via MyChart.   Sincerely,  Smitty Cords, MD

## 2017-08-31 NOTE — Progress Notes (Signed)
Subjective:    Patient ID: Vanessa Alvarez , female   DOB: 19-Feb-1986 , 33 y.o..   MRN: [id number]  HPI  Vanessa Alvarez is a 32 yo F with PMH of ESRD on HD, hypothyroidism, GERD, mental retardation, HTN here for   1. Nausea and Emesis follow up: Completed resolved after taking Zofran. The last time she took Zofran was 1 week ago.   2. Right Shoulder Pain: Patient endorsing right shoulder pain for the last week and a half.  She denies any trauma to the area.  She initially felt the pain at nighttime.  The pain is intermittent.  No aggravating or alleviating factors identified.  Currently she does not have any pain.  Denies any weakness.  Admits to one episode of numbness of her pinky finger.  Has not tried any pain medications or any ice or heat.  3. RASH  Had rash for 1-2 years, gets worse at certain times of year and then goes away Location: neck Medications tried: none Similar rash in past: yes Patient believes may be caused by unsure  New medications or antibiotics: no Tick, Insect or new pet exposure: no Recent travel: no New detergent or soap: no Immunocompromised: no  Symptoms Itching: sometimes Pain over rash: no Feeling ill all over: no Fever: no Mouth sores: no Face or tongue swelling: no Trouble breathing: no Joint swelling or pain: no  Review of Symptoms - see HPI PMH - Smoking status noted.      Medications: reviewed and updated Current Outpatient Medications  Medication Sig Dispense Refill  . acetaminophen (TYLENOL) 325 MG tablet Take 2 tablets (650 mg total) by mouth every 6 (six) hours as needed for moderate pain or fever. (Patient not taking: Reported on 05/04/2017) 30 tablet 0  . ARIPiprazole (ABILIFY) 5 MG tablet Take 5 mg by mouth at bedtime.    . benztropine (COGENTIN) 0.5 MG tablet Take 0.5 mg by mouth daily as needed for tremors.     . busPIRone (BUSPAR) 5 MG tablet Take 5 mg by mouth 3 (three) times daily.     . cinacalcet (SENSIPAR) 30 MG tablet  Take 30 mg by mouth daily.    . ferrous sulfate 325 (65 FE) MG tablet Take 1 tablet (325 mg total) by mouth 2 (two) times daily. 180 tablet 3  . lamoTRIgine (LAMICTAL) 150 MG tablet Take 150 mg by mouth at bedtime.  2  . levothyroxine (SYNTHROID, LEVOTHROID) 112 MCG tablet Take 1 tablet (112 mcg total) by mouth daily before breakfast. 30 tablet 12  . omeprazole (PRILOSEC) 20 MG capsule Take 1 capsule (20 mg total) by mouth daily. 30 capsule 3  . ondansetron (ZOFRAN ODT) 4 MG disintegrating tablet Take 1 tablet (4 mg total) by mouth every 8 (eight) hours as needed for nausea or vomiting. 10 tablet 0  . polyethylene glycol powder (GLYCOLAX/MIRALAX) powder Take 17 g by mouth daily as needed for mild constipation. Reported on 05/26/2015    . sertraline (ZOLOFT) 100 MG tablet Take 100 mg by mouth daily.     . sevelamer carbonate (RENVELA) 800 MG tablet Take 2,400 mg by mouth 3 (three) times daily with meals.     . SUMAtriptan (IMITREX) 25 MG tablet Take 1 tablet (25 mg total) by mouth every 2 (two) hours as needed for migraine. May repeat in 2 hours ONCE if headache persists or recurs. (Patient not taking: Reported on 05/04/2017) 10 tablet 1   No current facility-administered medications for this  visit.     Social Hx:  reports that she has never smoked. She has never used smokeless tobacco.   Objective:   BP 122/68   Pulse 85   Temp 98.5 F (36.9 C) (Oral)   Ht 5\' 4"  (1.626 m)   Wt 139 lb 12.8 oz (63.4 kg)   LMP 08/08/2017 (Approximate)   SpO2 99%   BMI 24.00 kg/m  Physical Exam  Gen: NAD, alert, cooperative with exam, well-appearing Skin: Several 2 to 3 mm hyperpigmented circular macules on neck Right shoulder: Inspection reveals no abnormalities, atrophy or asymmetry. Palpation is normal with no tenderness over AC joint or bicipital groove.  There is some tenderness over the trapezius muscle. ROM is full in all planes. Rotator cuff strength normal throughout. No signs of impingement  with negative Neer and Hawkin's tests, empty can sign. Normal scapular function observed. No painful arc and no drop arm sign. Neurovascularly intact  Assessment & Plan:   1. Non-intractable vomiting with nausea, unspecified vomiting type: Completely resolved.  Last time she took Zofran was 1 week ago.  Return precautions discussed  2. Acute pain of right shoulder:  Physical exam without any signs of rotator cuff syndrome.  She does have some tenderness over her trapezius muscle; suggested heat, Tylenol, and range of motion exercises.  Return precautions discussed  3. Rash and nonspecific skin eruption: Several 2 to 3 mm hyperpigmented circular macules on neck. Rash may be tinea versicolor.  Advised Selsun Blue shampoo application, discussed in AVS.  Return precautions discussed  Smitty Cords, MD [place], PGY-3

## 2017-08-31 NOTE — Assessment & Plan Note (Signed)
Resolved. Return precautions discussed

## 2017-09-01 DIAGNOSIS — N186 End stage renal disease: Secondary | ICD-10-CM | POA: Diagnosis not present

## 2017-09-01 DIAGNOSIS — Z992 Dependence on renal dialysis: Secondary | ICD-10-CM | POA: Diagnosis not present

## 2017-09-01 DIAGNOSIS — N2581 Secondary hyperparathyroidism of renal origin: Secondary | ICD-10-CM | POA: Diagnosis not present

## 2017-09-03 DIAGNOSIS — N186 End stage renal disease: Secondary | ICD-10-CM | POA: Diagnosis not present

## 2017-09-03 DIAGNOSIS — N2581 Secondary hyperparathyroidism of renal origin: Secondary | ICD-10-CM | POA: Diagnosis not present

## 2017-09-03 DIAGNOSIS — Z992 Dependence on renal dialysis: Secondary | ICD-10-CM | POA: Diagnosis not present

## 2017-09-06 DIAGNOSIS — N186 End stage renal disease: Secondary | ICD-10-CM | POA: Diagnosis not present

## 2017-09-06 DIAGNOSIS — N2581 Secondary hyperparathyroidism of renal origin: Secondary | ICD-10-CM | POA: Diagnosis not present

## 2017-09-06 DIAGNOSIS — Z992 Dependence on renal dialysis: Secondary | ICD-10-CM | POA: Diagnosis not present

## 2017-09-08 ENCOUNTER — Other Ambulatory Visit: Payer: Self-pay | Admitting: Family Medicine

## 2017-09-08 DIAGNOSIS — N186 End stage renal disease: Secondary | ICD-10-CM | POA: Diagnosis not present

## 2017-09-08 DIAGNOSIS — Z992 Dependence on renal dialysis: Secondary | ICD-10-CM | POA: Diagnosis not present

## 2017-09-08 DIAGNOSIS — N2581 Secondary hyperparathyroidism of renal origin: Secondary | ICD-10-CM | POA: Diagnosis not present

## 2017-09-10 DIAGNOSIS — N186 End stage renal disease: Secondary | ICD-10-CM | POA: Diagnosis not present

## 2017-09-10 DIAGNOSIS — Z992 Dependence on renal dialysis: Secondary | ICD-10-CM | POA: Diagnosis not present

## 2017-09-10 DIAGNOSIS — N2581 Secondary hyperparathyroidism of renal origin: Secondary | ICD-10-CM | POA: Diagnosis not present

## 2017-09-13 DIAGNOSIS — Z992 Dependence on renal dialysis: Secondary | ICD-10-CM | POA: Diagnosis not present

## 2017-09-13 DIAGNOSIS — N2581 Secondary hyperparathyroidism of renal origin: Secondary | ICD-10-CM | POA: Diagnosis not present

## 2017-09-13 DIAGNOSIS — N186 End stage renal disease: Secondary | ICD-10-CM | POA: Diagnosis not present

## 2017-09-15 DIAGNOSIS — Z992 Dependence on renal dialysis: Secondary | ICD-10-CM | POA: Diagnosis not present

## 2017-09-15 DIAGNOSIS — N186 End stage renal disease: Secondary | ICD-10-CM | POA: Diagnosis not present

## 2017-09-15 DIAGNOSIS — N2581 Secondary hyperparathyroidism of renal origin: Secondary | ICD-10-CM | POA: Diagnosis not present

## 2017-09-17 DIAGNOSIS — N2581 Secondary hyperparathyroidism of renal origin: Secondary | ICD-10-CM | POA: Diagnosis not present

## 2017-09-17 DIAGNOSIS — Z992 Dependence on renal dialysis: Secondary | ICD-10-CM | POA: Diagnosis not present

## 2017-09-17 DIAGNOSIS — N186 End stage renal disease: Secondary | ICD-10-CM | POA: Diagnosis not present

## 2017-09-20 DIAGNOSIS — Z992 Dependence on renal dialysis: Secondary | ICD-10-CM | POA: Diagnosis not present

## 2017-09-20 DIAGNOSIS — N186 End stage renal disease: Secondary | ICD-10-CM | POA: Diagnosis not present

## 2017-09-20 DIAGNOSIS — N2581 Secondary hyperparathyroidism of renal origin: Secondary | ICD-10-CM | POA: Diagnosis not present

## 2017-09-22 DIAGNOSIS — N186 End stage renal disease: Secondary | ICD-10-CM | POA: Diagnosis not present

## 2017-09-22 DIAGNOSIS — N2581 Secondary hyperparathyroidism of renal origin: Secondary | ICD-10-CM | POA: Diagnosis not present

## 2017-09-22 DIAGNOSIS — Z992 Dependence on renal dialysis: Secondary | ICD-10-CM | POA: Diagnosis not present

## 2017-09-24 DIAGNOSIS — N186 End stage renal disease: Secondary | ICD-10-CM | POA: Diagnosis not present

## 2017-09-24 DIAGNOSIS — Z992 Dependence on renal dialysis: Secondary | ICD-10-CM | POA: Diagnosis not present

## 2017-09-24 DIAGNOSIS — I158 Other secondary hypertension: Secondary | ICD-10-CM | POA: Diagnosis not present

## 2017-09-24 DIAGNOSIS — D631 Anemia in chronic kidney disease: Secondary | ICD-10-CM | POA: Diagnosis not present

## 2017-09-24 DIAGNOSIS — N2581 Secondary hyperparathyroidism of renal origin: Secondary | ICD-10-CM | POA: Diagnosis not present

## 2017-09-27 DIAGNOSIS — N2581 Secondary hyperparathyroidism of renal origin: Secondary | ICD-10-CM | POA: Diagnosis not present

## 2017-09-27 DIAGNOSIS — Z992 Dependence on renal dialysis: Secondary | ICD-10-CM | POA: Diagnosis not present

## 2017-09-27 DIAGNOSIS — D631 Anemia in chronic kidney disease: Secondary | ICD-10-CM | POA: Diagnosis not present

## 2017-09-27 DIAGNOSIS — N186 End stage renal disease: Secondary | ICD-10-CM | POA: Diagnosis not present

## 2017-09-29 DIAGNOSIS — Z992 Dependence on renal dialysis: Secondary | ICD-10-CM | POA: Diagnosis not present

## 2017-09-29 DIAGNOSIS — N2581 Secondary hyperparathyroidism of renal origin: Secondary | ICD-10-CM | POA: Diagnosis not present

## 2017-09-29 DIAGNOSIS — N186 End stage renal disease: Secondary | ICD-10-CM | POA: Diagnosis not present

## 2017-09-29 DIAGNOSIS — D631 Anemia in chronic kidney disease: Secondary | ICD-10-CM | POA: Diagnosis not present

## 2017-10-01 DIAGNOSIS — N2581 Secondary hyperparathyroidism of renal origin: Secondary | ICD-10-CM | POA: Diagnosis not present

## 2017-10-01 DIAGNOSIS — Z992 Dependence on renal dialysis: Secondary | ICD-10-CM | POA: Diagnosis not present

## 2017-10-01 DIAGNOSIS — D631 Anemia in chronic kidney disease: Secondary | ICD-10-CM | POA: Diagnosis not present

## 2017-10-01 DIAGNOSIS — N186 End stage renal disease: Secondary | ICD-10-CM | POA: Diagnosis not present

## 2017-10-04 DIAGNOSIS — N186 End stage renal disease: Secondary | ICD-10-CM | POA: Diagnosis not present

## 2017-10-04 DIAGNOSIS — D631 Anemia in chronic kidney disease: Secondary | ICD-10-CM | POA: Diagnosis not present

## 2017-10-04 DIAGNOSIS — N2581 Secondary hyperparathyroidism of renal origin: Secondary | ICD-10-CM | POA: Diagnosis not present

## 2017-10-04 DIAGNOSIS — Z992 Dependence on renal dialysis: Secondary | ICD-10-CM | POA: Diagnosis not present

## 2017-10-06 DIAGNOSIS — N186 End stage renal disease: Secondary | ICD-10-CM | POA: Diagnosis not present

## 2017-10-06 DIAGNOSIS — Z992 Dependence on renal dialysis: Secondary | ICD-10-CM | POA: Diagnosis not present

## 2017-10-06 DIAGNOSIS — N2581 Secondary hyperparathyroidism of renal origin: Secondary | ICD-10-CM | POA: Diagnosis not present

## 2017-10-06 DIAGNOSIS — D631 Anemia in chronic kidney disease: Secondary | ICD-10-CM | POA: Diagnosis not present

## 2017-10-08 DIAGNOSIS — N186 End stage renal disease: Secondary | ICD-10-CM | POA: Diagnosis not present

## 2017-10-08 DIAGNOSIS — N2581 Secondary hyperparathyroidism of renal origin: Secondary | ICD-10-CM | POA: Diagnosis not present

## 2017-10-08 DIAGNOSIS — Z992 Dependence on renal dialysis: Secondary | ICD-10-CM | POA: Diagnosis not present

## 2017-10-08 DIAGNOSIS — D631 Anemia in chronic kidney disease: Secondary | ICD-10-CM | POA: Diagnosis not present

## 2017-10-11 DIAGNOSIS — D631 Anemia in chronic kidney disease: Secondary | ICD-10-CM | POA: Diagnosis not present

## 2017-10-11 DIAGNOSIS — Z992 Dependence on renal dialysis: Secondary | ICD-10-CM | POA: Diagnosis not present

## 2017-10-11 DIAGNOSIS — N2581 Secondary hyperparathyroidism of renal origin: Secondary | ICD-10-CM | POA: Diagnosis not present

## 2017-10-11 DIAGNOSIS — N186 End stage renal disease: Secondary | ICD-10-CM | POA: Diagnosis not present

## 2017-10-13 DIAGNOSIS — D631 Anemia in chronic kidney disease: Secondary | ICD-10-CM | POA: Diagnosis not present

## 2017-10-13 DIAGNOSIS — N186 End stage renal disease: Secondary | ICD-10-CM | POA: Diagnosis not present

## 2017-10-13 DIAGNOSIS — Z992 Dependence on renal dialysis: Secondary | ICD-10-CM | POA: Diagnosis not present

## 2017-10-13 DIAGNOSIS — N2581 Secondary hyperparathyroidism of renal origin: Secondary | ICD-10-CM | POA: Diagnosis not present

## 2017-10-14 ENCOUNTER — Ambulatory Visit (INDEPENDENT_AMBULATORY_CARE_PROVIDER_SITE_OTHER): Payer: Medicare Other | Admitting: Internal Medicine

## 2017-10-14 ENCOUNTER — Other Ambulatory Visit: Payer: Self-pay

## 2017-10-14 ENCOUNTER — Encounter: Payer: Self-pay | Admitting: Internal Medicine

## 2017-10-14 VITALS — BP 122/78 | HR 83 | Temp 98.8°F | Ht 64.0 in | Wt 139.4 lb

## 2017-10-14 DIAGNOSIS — M542 Cervicalgia: Secondary | ICD-10-CM | POA: Diagnosis present

## 2017-10-14 DIAGNOSIS — R112 Nausea with vomiting, unspecified: Secondary | ICD-10-CM | POA: Diagnosis not present

## 2017-10-14 MED ORDER — ONDANSETRON 4 MG PO TBDP: 4.0000 mg | ORAL_TABLET | Freq: Three times a day (TID) | ORAL | 0 refills | Status: DC | PRN

## 2017-10-14 NOTE — Patient Instructions (Signed)
Ms. Vanessa Alvarez,   I'm glad your neck pain improved. It was likely a pulled muscle. In the future, try tylenol and heat for this (650 mg twice daily as needed).  I have refilled zofran.  Olene Floss, MD [place], PGY-3   Muscle Strain A muscle strain (pulled muscle) happens when a muscle is stretched beyond normal length. It happens when a sudden, violent force stretches your muscle too far. Usually, a few of the fibers in your muscle are torn. Muscle strain is common in athletes. Recovery usually takes 1-2 weeks. Complete healing takes 5-6 weeks. Follow these instructions at home:  Follow the PRICE method of treatment to help your injury get better. Do this the first 2-3 days after the injury: ? Protect. Protect the muscle to keep it from getting injured again. ? Rest. Limit your activity and rest the injured body part. ? Ice. Put ice in a plastic bag. Place a towel between your skin and the bag. Then, apply the ice and leave it on from 15-20 minutes each hour. After the third day, switch to moist heat packs. ? Compression. Use a splint or elastic bandage on the injured area for comfort. Do not put it on too tightly. ? Elevate. Keep the injured body part above the level of your heart.  Only take medicine as told by your doctor.  Warm up before doing exercise to prevent future muscle strains. Contact a doctor if:  You have more pain or puffiness (swelling) in the injured area.  You feel numbness, tingling, or notice a loss of strength in the injured area. This information is not intended to replace advice given to you by your health care provider. Make sure you discuss any questions you have with your health care provider. Document Released: 01/20/2008 Document Revised: 09/18/2015 Document Reviewed: 11/09/2012 Elsevier Interactive Patient Education  2017 [employer].

## 2017-10-15 DIAGNOSIS — D631 Anemia in chronic kidney disease: Secondary | ICD-10-CM | POA: Diagnosis not present

## 2017-10-15 DIAGNOSIS — N2581 Secondary hyperparathyroidism of renal origin: Secondary | ICD-10-CM | POA: Diagnosis not present

## 2017-10-15 DIAGNOSIS — Z992 Dependence on renal dialysis: Secondary | ICD-10-CM | POA: Diagnosis not present

## 2017-10-15 DIAGNOSIS — N186 End stage renal disease: Secondary | ICD-10-CM | POA: Diagnosis not present

## 2017-10-16 NOTE — Progress Notes (Signed)
[facility] Progress Note  Subjective:  Vanessa Alvarez is a 32 y.o. 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 She is accompanied by her mother. Patient says she woke up with neck pain a couple days ago that made it hurt to lift her head. She has not tried anything for pain and says pain only a 1/10 now. Mother says she was concerned given how severe the pain seemed. Patient denies fevers. She has some chronic nausea and vomiting but no worsening. No rashes. No changes in vision. No headaches.   No Known Allergies  Social History   Tobacco Use  . Smoking status: Never Smoker  . Smokeless tobacco: Never Used  Substance Use Topics  . Alcohol use: No    Alcohol/week: 0.0 oz    Objective: Blood pressure 122/78, pulse 83, temperature 98.8 F (37.1 C), temperature source Oral, height 5\' 4"  (1.626 m), weight 139 lb 6.4 oz (63.2 kg), SpO2 99 %. Body mass index is 23.93 kg/m. Constitutional: Well-appearing female in NAD, pleasant HENT: MMM, normal posterior oropharynx, no cervical lymphadenopathy  Cardiovascular: RRR, S1, S2, no m/r/g.  Pulmonary/Chest: Effort normal and breath sounds normal.  Musculoskeletal: No TTP of upper back or neck. No midline spinal TTP. FROM at neck.  Neurological: AOx3, no focal deficits. Skin: Skin is warm and dry. No rash noted.  Psychiatric: Normal mood and affect.  Vitals reviewed  Assessment/Plan: Neck pain - Asymptomatic at present. Suspect muscle strain and recommended prn tylenol and heat if symptoms return. No fever or lymphadenopathy to suggest infection.  Follow-up prn.  Olene Floss, MD [place], PGY-3

## 2017-10-17 ENCOUNTER — Encounter: Payer: Self-pay | Admitting: Internal Medicine

## 2017-10-17 NOTE — Assessment & Plan Note (Signed)
-   Asymptomatic at present. Suspect muscle strain and recommended prn tylenol and heat if symptoms return. No fever or lymphadenopathy to suggest infection.

## 2017-10-18 DIAGNOSIS — Z992 Dependence on renal dialysis: Secondary | ICD-10-CM | POA: Diagnosis not present

## 2017-10-18 DIAGNOSIS — N2581 Secondary hyperparathyroidism of renal origin: Secondary | ICD-10-CM | POA: Diagnosis not present

## 2017-10-18 DIAGNOSIS — D631 Anemia in chronic kidney disease: Secondary | ICD-10-CM | POA: Diagnosis not present

## 2017-10-18 DIAGNOSIS — N186 End stage renal disease: Secondary | ICD-10-CM | POA: Diagnosis not present

## 2017-10-20 DIAGNOSIS — N186 End stage renal disease: Secondary | ICD-10-CM | POA: Diagnosis not present

## 2017-10-20 DIAGNOSIS — N2581 Secondary hyperparathyroidism of renal origin: Secondary | ICD-10-CM | POA: Diagnosis not present

## 2017-10-20 DIAGNOSIS — Z992 Dependence on renal dialysis: Secondary | ICD-10-CM | POA: Diagnosis not present

## 2017-10-20 DIAGNOSIS — D631 Anemia in chronic kidney disease: Secondary | ICD-10-CM | POA: Diagnosis not present

## 2017-10-22 DIAGNOSIS — D631 Anemia in chronic kidney disease: Secondary | ICD-10-CM | POA: Diagnosis not present

## 2017-10-22 DIAGNOSIS — Z992 Dependence on renal dialysis: Secondary | ICD-10-CM | POA: Diagnosis not present

## 2017-10-22 DIAGNOSIS — N2581 Secondary hyperparathyroidism of renal origin: Secondary | ICD-10-CM | POA: Diagnosis not present

## 2017-10-22 DIAGNOSIS — N186 End stage renal disease: Secondary | ICD-10-CM | POA: Diagnosis not present

## 2017-10-24 DIAGNOSIS — N186 End stage renal disease: Secondary | ICD-10-CM | POA: Diagnosis not present

## 2017-10-24 DIAGNOSIS — Z992 Dependence on renal dialysis: Secondary | ICD-10-CM | POA: Diagnosis not present

## 2017-10-24 DIAGNOSIS — I158 Other secondary hypertension: Secondary | ICD-10-CM | POA: Diagnosis not present

## 2017-10-25 DIAGNOSIS — Z992 Dependence on renal dialysis: Secondary | ICD-10-CM | POA: Diagnosis not present

## 2017-10-25 DIAGNOSIS — N186 End stage renal disease: Secondary | ICD-10-CM | POA: Diagnosis not present

## 2017-10-25 DIAGNOSIS — N2581 Secondary hyperparathyroidism of renal origin: Secondary | ICD-10-CM | POA: Diagnosis not present

## 2017-10-27 DIAGNOSIS — Z992 Dependence on renal dialysis: Secondary | ICD-10-CM | POA: Diagnosis not present

## 2017-10-27 DIAGNOSIS — N2581 Secondary hyperparathyroidism of renal origin: Secondary | ICD-10-CM | POA: Diagnosis not present

## 2017-10-27 DIAGNOSIS — N186 End stage renal disease: Secondary | ICD-10-CM | POA: Diagnosis not present

## 2017-10-29 DIAGNOSIS — N186 End stage renal disease: Secondary | ICD-10-CM | POA: Diagnosis not present

## 2017-10-29 DIAGNOSIS — N2581 Secondary hyperparathyroidism of renal origin: Secondary | ICD-10-CM | POA: Diagnosis not present

## 2017-10-29 DIAGNOSIS — Z992 Dependence on renal dialysis: Secondary | ICD-10-CM | POA: Diagnosis not present

## 2017-11-01 DIAGNOSIS — N186 End stage renal disease: Secondary | ICD-10-CM | POA: Diagnosis not present

## 2017-11-01 DIAGNOSIS — N2581 Secondary hyperparathyroidism of renal origin: Secondary | ICD-10-CM | POA: Diagnosis not present

## 2017-11-01 DIAGNOSIS — Z992 Dependence on renal dialysis: Secondary | ICD-10-CM | POA: Diagnosis not present

## 2017-11-02 DIAGNOSIS — F319 Bipolar disorder, unspecified: Secondary | ICD-10-CM | POA: Diagnosis not present

## 2017-11-03 DIAGNOSIS — N186 End stage renal disease: Secondary | ICD-10-CM | POA: Diagnosis not present

## 2017-11-03 DIAGNOSIS — Z992 Dependence on renal dialysis: Secondary | ICD-10-CM | POA: Diagnosis not present

## 2017-11-03 DIAGNOSIS — N2581 Secondary hyperparathyroidism of renal origin: Secondary | ICD-10-CM | POA: Diagnosis not present

## 2017-11-05 DIAGNOSIS — N2581 Secondary hyperparathyroidism of renal origin: Secondary | ICD-10-CM | POA: Diagnosis not present

## 2017-11-05 DIAGNOSIS — N186 End stage renal disease: Secondary | ICD-10-CM | POA: Diagnosis not present

## 2017-11-05 DIAGNOSIS — Z992 Dependence on renal dialysis: Secondary | ICD-10-CM | POA: Diagnosis not present

## 2017-11-08 DIAGNOSIS — N186 End stage renal disease: Secondary | ICD-10-CM | POA: Diagnosis not present

## 2017-11-08 DIAGNOSIS — Z992 Dependence on renal dialysis: Secondary | ICD-10-CM | POA: Diagnosis not present

## 2017-11-08 DIAGNOSIS — N2581 Secondary hyperparathyroidism of renal origin: Secondary | ICD-10-CM | POA: Diagnosis not present

## 2017-11-10 DIAGNOSIS — N186 End stage renal disease: Secondary | ICD-10-CM | POA: Diagnosis not present

## 2017-11-10 DIAGNOSIS — Z992 Dependence on renal dialysis: Secondary | ICD-10-CM | POA: Diagnosis not present

## 2017-11-10 DIAGNOSIS — N2581 Secondary hyperparathyroidism of renal origin: Secondary | ICD-10-CM | POA: Diagnosis not present

## 2017-11-12 DIAGNOSIS — Z992 Dependence on renal dialysis: Secondary | ICD-10-CM | POA: Diagnosis not present

## 2017-11-12 DIAGNOSIS — N186 End stage renal disease: Secondary | ICD-10-CM | POA: Diagnosis not present

## 2017-11-12 DIAGNOSIS — N2581 Secondary hyperparathyroidism of renal origin: Secondary | ICD-10-CM | POA: Diagnosis not present

## 2017-11-15 DIAGNOSIS — Z992 Dependence on renal dialysis: Secondary | ICD-10-CM | POA: Diagnosis not present

## 2017-11-15 DIAGNOSIS — N2581 Secondary hyperparathyroidism of renal origin: Secondary | ICD-10-CM | POA: Diagnosis not present

## 2017-11-15 DIAGNOSIS — N186 End stage renal disease: Secondary | ICD-10-CM | POA: Diagnosis not present

## 2017-11-17 DIAGNOSIS — N186 End stage renal disease: Secondary | ICD-10-CM | POA: Diagnosis not present

## 2017-11-17 DIAGNOSIS — N2581 Secondary hyperparathyroidism of renal origin: Secondary | ICD-10-CM | POA: Diagnosis not present

## 2017-11-17 DIAGNOSIS — Z992 Dependence on renal dialysis: Secondary | ICD-10-CM | POA: Diagnosis not present

## 2017-11-19 DIAGNOSIS — N2581 Secondary hyperparathyroidism of renal origin: Secondary | ICD-10-CM | POA: Diagnosis not present

## 2017-11-19 DIAGNOSIS — N186 End stage renal disease: Secondary | ICD-10-CM | POA: Diagnosis not present

## 2017-11-19 DIAGNOSIS — Z992 Dependence on renal dialysis: Secondary | ICD-10-CM | POA: Diagnosis not present

## 2017-11-22 DIAGNOSIS — Z992 Dependence on renal dialysis: Secondary | ICD-10-CM | POA: Diagnosis not present

## 2017-11-22 DIAGNOSIS — N2581 Secondary hyperparathyroidism of renal origin: Secondary | ICD-10-CM | POA: Diagnosis not present

## 2017-11-22 DIAGNOSIS — N186 End stage renal disease: Secondary | ICD-10-CM | POA: Diagnosis not present

## 2017-11-24 DIAGNOSIS — I158 Other secondary hypertension: Secondary | ICD-10-CM | POA: Diagnosis not present

## 2017-11-24 DIAGNOSIS — N186 End stage renal disease: Secondary | ICD-10-CM | POA: Diagnosis not present

## 2017-11-24 DIAGNOSIS — N2581 Secondary hyperparathyroidism of renal origin: Secondary | ICD-10-CM | POA: Diagnosis not present

## 2017-11-24 DIAGNOSIS — D631 Anemia in chronic kidney disease: Secondary | ICD-10-CM | POA: Diagnosis not present

## 2017-11-24 DIAGNOSIS — Z992 Dependence on renal dialysis: Secondary | ICD-10-CM | POA: Diagnosis not present

## 2017-11-26 DIAGNOSIS — Z992 Dependence on renal dialysis: Secondary | ICD-10-CM | POA: Diagnosis not present

## 2017-11-26 DIAGNOSIS — D631 Anemia in chronic kidney disease: Secondary | ICD-10-CM | POA: Diagnosis not present

## 2017-11-26 DIAGNOSIS — N186 End stage renal disease: Secondary | ICD-10-CM | POA: Diagnosis not present

## 2017-11-26 DIAGNOSIS — N2581 Secondary hyperparathyroidism of renal origin: Secondary | ICD-10-CM | POA: Diagnosis not present

## 2017-11-29 DIAGNOSIS — D631 Anemia in chronic kidney disease: Secondary | ICD-10-CM | POA: Diagnosis not present

## 2017-11-29 DIAGNOSIS — N186 End stage renal disease: Secondary | ICD-10-CM | POA: Diagnosis not present

## 2017-11-29 DIAGNOSIS — Z992 Dependence on renal dialysis: Secondary | ICD-10-CM | POA: Diagnosis not present

## 2017-11-29 DIAGNOSIS — N2581 Secondary hyperparathyroidism of renal origin: Secondary | ICD-10-CM | POA: Diagnosis not present

## 2017-12-01 DIAGNOSIS — Z992 Dependence on renal dialysis: Secondary | ICD-10-CM | POA: Diagnosis not present

## 2017-12-01 DIAGNOSIS — D631 Anemia in chronic kidney disease: Secondary | ICD-10-CM | POA: Diagnosis not present

## 2017-12-01 DIAGNOSIS — N2581 Secondary hyperparathyroidism of renal origin: Secondary | ICD-10-CM | POA: Diagnosis not present

## 2017-12-01 DIAGNOSIS — N186 End stage renal disease: Secondary | ICD-10-CM | POA: Diagnosis not present

## 2017-12-03 DIAGNOSIS — N186 End stage renal disease: Secondary | ICD-10-CM | POA: Diagnosis not present

## 2017-12-03 DIAGNOSIS — N2581 Secondary hyperparathyroidism of renal origin: Secondary | ICD-10-CM | POA: Diagnosis not present

## 2017-12-03 DIAGNOSIS — D631 Anemia in chronic kidney disease: Secondary | ICD-10-CM | POA: Diagnosis not present

## 2017-12-03 DIAGNOSIS — Z992 Dependence on renal dialysis: Secondary | ICD-10-CM | POA: Diagnosis not present

## 2017-12-06 DIAGNOSIS — Z992 Dependence on renal dialysis: Secondary | ICD-10-CM | POA: Diagnosis not present

## 2017-12-06 DIAGNOSIS — N186 End stage renal disease: Secondary | ICD-10-CM | POA: Diagnosis not present

## 2017-12-06 DIAGNOSIS — D631 Anemia in chronic kidney disease: Secondary | ICD-10-CM | POA: Diagnosis not present

## 2017-12-06 DIAGNOSIS — N2581 Secondary hyperparathyroidism of renal origin: Secondary | ICD-10-CM | POA: Diagnosis not present

## 2017-12-08 DIAGNOSIS — Z992 Dependence on renal dialysis: Secondary | ICD-10-CM | POA: Diagnosis not present

## 2017-12-08 DIAGNOSIS — N186 End stage renal disease: Secondary | ICD-10-CM | POA: Diagnosis not present

## 2017-12-08 DIAGNOSIS — N2581 Secondary hyperparathyroidism of renal origin: Secondary | ICD-10-CM | POA: Diagnosis not present

## 2017-12-08 DIAGNOSIS — D631 Anemia in chronic kidney disease: Secondary | ICD-10-CM | POA: Diagnosis not present

## 2017-12-10 DIAGNOSIS — N2581 Secondary hyperparathyroidism of renal origin: Secondary | ICD-10-CM | POA: Diagnosis not present

## 2017-12-10 DIAGNOSIS — Z992 Dependence on renal dialysis: Secondary | ICD-10-CM | POA: Diagnosis not present

## 2017-12-10 DIAGNOSIS — D631 Anemia in chronic kidney disease: Secondary | ICD-10-CM | POA: Diagnosis not present

## 2017-12-10 DIAGNOSIS — N186 End stage renal disease: Secondary | ICD-10-CM | POA: Diagnosis not present

## 2017-12-13 DIAGNOSIS — Z992 Dependence on renal dialysis: Secondary | ICD-10-CM | POA: Diagnosis not present

## 2017-12-13 DIAGNOSIS — D631 Anemia in chronic kidney disease: Secondary | ICD-10-CM | POA: Diagnosis not present

## 2017-12-13 DIAGNOSIS — N2581 Secondary hyperparathyroidism of renal origin: Secondary | ICD-10-CM | POA: Diagnosis not present

## 2017-12-13 DIAGNOSIS — N186 End stage renal disease: Secondary | ICD-10-CM | POA: Diagnosis not present

## 2017-12-15 DIAGNOSIS — N2581 Secondary hyperparathyroidism of renal origin: Secondary | ICD-10-CM | POA: Diagnosis not present

## 2017-12-15 DIAGNOSIS — D631 Anemia in chronic kidney disease: Secondary | ICD-10-CM | POA: Diagnosis not present

## 2017-12-15 DIAGNOSIS — Z992 Dependence on renal dialysis: Secondary | ICD-10-CM | POA: Diagnosis not present

## 2017-12-15 DIAGNOSIS — N186 End stage renal disease: Secondary | ICD-10-CM | POA: Diagnosis not present

## 2017-12-17 DIAGNOSIS — Z992 Dependence on renal dialysis: Secondary | ICD-10-CM | POA: Diagnosis not present

## 2017-12-17 DIAGNOSIS — N186 End stage renal disease: Secondary | ICD-10-CM | POA: Diagnosis not present

## 2017-12-17 DIAGNOSIS — N2581 Secondary hyperparathyroidism of renal origin: Secondary | ICD-10-CM | POA: Diagnosis not present

## 2017-12-17 DIAGNOSIS — D631 Anemia in chronic kidney disease: Secondary | ICD-10-CM | POA: Diagnosis not present

## 2017-12-20 DIAGNOSIS — N2581 Secondary hyperparathyroidism of renal origin: Secondary | ICD-10-CM | POA: Diagnosis not present

## 2017-12-20 DIAGNOSIS — N186 End stage renal disease: Secondary | ICD-10-CM | POA: Diagnosis not present

## 2017-12-20 DIAGNOSIS — D631 Anemia in chronic kidney disease: Secondary | ICD-10-CM | POA: Diagnosis not present

## 2017-12-20 DIAGNOSIS — Z992 Dependence on renal dialysis: Secondary | ICD-10-CM | POA: Diagnosis not present

## 2017-12-22 DIAGNOSIS — N186 End stage renal disease: Secondary | ICD-10-CM | POA: Diagnosis not present

## 2017-12-22 DIAGNOSIS — N2581 Secondary hyperparathyroidism of renal origin: Secondary | ICD-10-CM | POA: Diagnosis not present

## 2017-12-22 DIAGNOSIS — D631 Anemia in chronic kidney disease: Secondary | ICD-10-CM | POA: Diagnosis not present

## 2017-12-22 DIAGNOSIS — Z992 Dependence on renal dialysis: Secondary | ICD-10-CM | POA: Diagnosis not present

## 2017-12-24 DIAGNOSIS — N186 End stage renal disease: Secondary | ICD-10-CM | POA: Diagnosis not present

## 2017-12-24 DIAGNOSIS — N2581 Secondary hyperparathyroidism of renal origin: Secondary | ICD-10-CM | POA: Diagnosis not present

## 2017-12-24 DIAGNOSIS — Z992 Dependence on renal dialysis: Secondary | ICD-10-CM | POA: Diagnosis not present

## 2017-12-24 DIAGNOSIS — D631 Anemia in chronic kidney disease: Secondary | ICD-10-CM | POA: Diagnosis not present

## 2017-12-25 DIAGNOSIS — Z992 Dependence on renal dialysis: Secondary | ICD-10-CM | POA: Diagnosis not present

## 2017-12-25 DIAGNOSIS — I158 Other secondary hypertension: Secondary | ICD-10-CM | POA: Diagnosis not present

## 2017-12-25 DIAGNOSIS — N186 End stage renal disease: Secondary | ICD-10-CM | POA: Diagnosis not present

## 2017-12-27 DIAGNOSIS — Z992 Dependence on renal dialysis: Secondary | ICD-10-CM | POA: Diagnosis not present

## 2017-12-27 DIAGNOSIS — N2581 Secondary hyperparathyroidism of renal origin: Secondary | ICD-10-CM | POA: Diagnosis not present

## 2017-12-27 DIAGNOSIS — Z23 Encounter for immunization: Secondary | ICD-10-CM | POA: Diagnosis not present

## 2017-12-27 DIAGNOSIS — N186 End stage renal disease: Secondary | ICD-10-CM | POA: Diagnosis not present

## 2017-12-27 DIAGNOSIS — D631 Anemia in chronic kidney disease: Secondary | ICD-10-CM | POA: Diagnosis not present

## 2017-12-29 ENCOUNTER — Ambulatory Visit: Payer: Medicare Other

## 2017-12-29 DIAGNOSIS — N2581 Secondary hyperparathyroidism of renal origin: Secondary | ICD-10-CM | POA: Diagnosis not present

## 2017-12-29 DIAGNOSIS — Z992 Dependence on renal dialysis: Secondary | ICD-10-CM | POA: Diagnosis not present

## 2017-12-29 DIAGNOSIS — N186 End stage renal disease: Secondary | ICD-10-CM | POA: Diagnosis not present

## 2017-12-29 DIAGNOSIS — D631 Anemia in chronic kidney disease: Secondary | ICD-10-CM | POA: Diagnosis not present

## 2017-12-29 DIAGNOSIS — Z23 Encounter for immunization: Secondary | ICD-10-CM | POA: Diagnosis not present

## 2017-12-31 DIAGNOSIS — D631 Anemia in chronic kidney disease: Secondary | ICD-10-CM | POA: Diagnosis not present

## 2017-12-31 DIAGNOSIS — Z23 Encounter for immunization: Secondary | ICD-10-CM | POA: Diagnosis not present

## 2017-12-31 DIAGNOSIS — Z992 Dependence on renal dialysis: Secondary | ICD-10-CM | POA: Diagnosis not present

## 2017-12-31 DIAGNOSIS — N2581 Secondary hyperparathyroidism of renal origin: Secondary | ICD-10-CM | POA: Diagnosis not present

## 2017-12-31 DIAGNOSIS — N186 End stage renal disease: Secondary | ICD-10-CM | POA: Diagnosis not present

## 2018-01-03 DIAGNOSIS — Z23 Encounter for immunization: Secondary | ICD-10-CM | POA: Diagnosis not present

## 2018-01-03 DIAGNOSIS — D631 Anemia in chronic kidney disease: Secondary | ICD-10-CM | POA: Diagnosis not present

## 2018-01-03 DIAGNOSIS — Z992 Dependence on renal dialysis: Secondary | ICD-10-CM | POA: Diagnosis not present

## 2018-01-03 DIAGNOSIS — N2581 Secondary hyperparathyroidism of renal origin: Secondary | ICD-10-CM | POA: Diagnosis not present

## 2018-01-03 DIAGNOSIS — N186 End stage renal disease: Secondary | ICD-10-CM | POA: Diagnosis not present

## 2018-01-05 DIAGNOSIS — Z992 Dependence on renal dialysis: Secondary | ICD-10-CM | POA: Diagnosis not present

## 2018-01-05 DIAGNOSIS — N2581 Secondary hyperparathyroidism of renal origin: Secondary | ICD-10-CM | POA: Diagnosis not present

## 2018-01-05 DIAGNOSIS — N186 End stage renal disease: Secondary | ICD-10-CM | POA: Diagnosis not present

## 2018-01-05 DIAGNOSIS — D631 Anemia in chronic kidney disease: Secondary | ICD-10-CM | POA: Diagnosis not present

## 2018-01-05 DIAGNOSIS — Z23 Encounter for immunization: Secondary | ICD-10-CM | POA: Diagnosis not present

## 2018-01-06 ENCOUNTER — Other Ambulatory Visit: Payer: Self-pay | Admitting: Family Medicine

## 2018-01-07 DIAGNOSIS — Z992 Dependence on renal dialysis: Secondary | ICD-10-CM | POA: Diagnosis not present

## 2018-01-07 DIAGNOSIS — Z23 Encounter for immunization: Secondary | ICD-10-CM | POA: Diagnosis not present

## 2018-01-07 DIAGNOSIS — D631 Anemia in chronic kidney disease: Secondary | ICD-10-CM | POA: Diagnosis not present

## 2018-01-07 DIAGNOSIS — N2581 Secondary hyperparathyroidism of renal origin: Secondary | ICD-10-CM | POA: Diagnosis not present

## 2018-01-07 DIAGNOSIS — N186 End stage renal disease: Secondary | ICD-10-CM | POA: Diagnosis not present

## 2018-01-10 DIAGNOSIS — Z23 Encounter for immunization: Secondary | ICD-10-CM | POA: Diagnosis not present

## 2018-01-10 DIAGNOSIS — D631 Anemia in chronic kidney disease: Secondary | ICD-10-CM | POA: Diagnosis not present

## 2018-01-10 DIAGNOSIS — N186 End stage renal disease: Secondary | ICD-10-CM | POA: Diagnosis not present

## 2018-01-10 DIAGNOSIS — N2581 Secondary hyperparathyroidism of renal origin: Secondary | ICD-10-CM | POA: Diagnosis not present

## 2018-01-10 DIAGNOSIS — Z992 Dependence on renal dialysis: Secondary | ICD-10-CM | POA: Diagnosis not present

## 2018-01-12 DIAGNOSIS — Z992 Dependence on renal dialysis: Secondary | ICD-10-CM | POA: Diagnosis not present

## 2018-01-12 DIAGNOSIS — Z23 Encounter for immunization: Secondary | ICD-10-CM | POA: Diagnosis not present

## 2018-01-12 DIAGNOSIS — D631 Anemia in chronic kidney disease: Secondary | ICD-10-CM | POA: Diagnosis not present

## 2018-01-12 DIAGNOSIS — N2581 Secondary hyperparathyroidism of renal origin: Secondary | ICD-10-CM | POA: Diagnosis not present

## 2018-01-12 DIAGNOSIS — N186 End stage renal disease: Secondary | ICD-10-CM | POA: Diagnosis not present

## 2018-01-14 DIAGNOSIS — Z992 Dependence on renal dialysis: Secondary | ICD-10-CM | POA: Diagnosis not present

## 2018-01-14 DIAGNOSIS — N186 End stage renal disease: Secondary | ICD-10-CM | POA: Diagnosis not present

## 2018-01-14 DIAGNOSIS — N2581 Secondary hyperparathyroidism of renal origin: Secondary | ICD-10-CM | POA: Diagnosis not present

## 2018-01-14 DIAGNOSIS — Z23 Encounter for immunization: Secondary | ICD-10-CM | POA: Diagnosis not present

## 2018-01-14 DIAGNOSIS — D631 Anemia in chronic kidney disease: Secondary | ICD-10-CM | POA: Diagnosis not present

## 2018-01-17 DIAGNOSIS — Z992 Dependence on renal dialysis: Secondary | ICD-10-CM | POA: Diagnosis not present

## 2018-01-17 DIAGNOSIS — N186 End stage renal disease: Secondary | ICD-10-CM | POA: Diagnosis not present

## 2018-01-17 DIAGNOSIS — Z23 Encounter for immunization: Secondary | ICD-10-CM | POA: Diagnosis not present

## 2018-01-17 DIAGNOSIS — D631 Anemia in chronic kidney disease: Secondary | ICD-10-CM | POA: Diagnosis not present

## 2018-01-17 DIAGNOSIS — N2581 Secondary hyperparathyroidism of renal origin: Secondary | ICD-10-CM | POA: Diagnosis not present

## 2018-01-19 DIAGNOSIS — N186 End stage renal disease: Secondary | ICD-10-CM | POA: Diagnosis not present

## 2018-01-19 DIAGNOSIS — D631 Anemia in chronic kidney disease: Secondary | ICD-10-CM | POA: Diagnosis not present

## 2018-01-19 DIAGNOSIS — N2581 Secondary hyperparathyroidism of renal origin: Secondary | ICD-10-CM | POA: Diagnosis not present

## 2018-01-19 DIAGNOSIS — Z23 Encounter for immunization: Secondary | ICD-10-CM | POA: Diagnosis not present

## 2018-01-19 DIAGNOSIS — Z992 Dependence on renal dialysis: Secondary | ICD-10-CM | POA: Diagnosis not present

## 2018-01-21 DIAGNOSIS — N2581 Secondary hyperparathyroidism of renal origin: Secondary | ICD-10-CM | POA: Diagnosis not present

## 2018-01-21 DIAGNOSIS — Z23 Encounter for immunization: Secondary | ICD-10-CM | POA: Diagnosis not present

## 2018-01-21 DIAGNOSIS — Z992 Dependence on renal dialysis: Secondary | ICD-10-CM | POA: Diagnosis not present

## 2018-01-21 DIAGNOSIS — N186 End stage renal disease: Secondary | ICD-10-CM | POA: Diagnosis not present

## 2018-01-21 DIAGNOSIS — D631 Anemia in chronic kidney disease: Secondary | ICD-10-CM | POA: Diagnosis not present

## 2018-01-24 DIAGNOSIS — N2581 Secondary hyperparathyroidism of renal origin: Secondary | ICD-10-CM | POA: Diagnosis not present

## 2018-01-24 DIAGNOSIS — D631 Anemia in chronic kidney disease: Secondary | ICD-10-CM | POA: Diagnosis not present

## 2018-01-24 DIAGNOSIS — N186 End stage renal disease: Secondary | ICD-10-CM | POA: Diagnosis not present

## 2018-01-24 DIAGNOSIS — Z992 Dependence on renal dialysis: Secondary | ICD-10-CM | POA: Diagnosis not present

## 2018-01-24 DIAGNOSIS — D509 Iron deficiency anemia, unspecified: Secondary | ICD-10-CM | POA: Diagnosis not present

## 2018-01-24 DIAGNOSIS — I158 Other secondary hypertension: Secondary | ICD-10-CM | POA: Diagnosis not present

## 2018-01-26 DIAGNOSIS — N2581 Secondary hyperparathyroidism of renal origin: Secondary | ICD-10-CM | POA: Diagnosis not present

## 2018-01-26 DIAGNOSIS — Z992 Dependence on renal dialysis: Secondary | ICD-10-CM | POA: Diagnosis not present

## 2018-01-26 DIAGNOSIS — D509 Iron deficiency anemia, unspecified: Secondary | ICD-10-CM | POA: Diagnosis not present

## 2018-01-26 DIAGNOSIS — D631 Anemia in chronic kidney disease: Secondary | ICD-10-CM | POA: Diagnosis not present

## 2018-01-26 DIAGNOSIS — N186 End stage renal disease: Secondary | ICD-10-CM | POA: Diagnosis not present

## 2018-01-28 DIAGNOSIS — N186 End stage renal disease: Secondary | ICD-10-CM | POA: Diagnosis not present

## 2018-01-28 DIAGNOSIS — D509 Iron deficiency anemia, unspecified: Secondary | ICD-10-CM | POA: Diagnosis not present

## 2018-01-28 DIAGNOSIS — N2581 Secondary hyperparathyroidism of renal origin: Secondary | ICD-10-CM | POA: Diagnosis not present

## 2018-01-28 DIAGNOSIS — D631 Anemia in chronic kidney disease: Secondary | ICD-10-CM | POA: Diagnosis not present

## 2018-01-28 DIAGNOSIS — Z992 Dependence on renal dialysis: Secondary | ICD-10-CM | POA: Diagnosis not present

## 2018-01-31 DIAGNOSIS — N186 End stage renal disease: Secondary | ICD-10-CM | POA: Diagnosis not present

## 2018-01-31 DIAGNOSIS — D509 Iron deficiency anemia, unspecified: Secondary | ICD-10-CM | POA: Diagnosis not present

## 2018-01-31 DIAGNOSIS — Z992 Dependence on renal dialysis: Secondary | ICD-10-CM | POA: Diagnosis not present

## 2018-01-31 DIAGNOSIS — N2581 Secondary hyperparathyroidism of renal origin: Secondary | ICD-10-CM | POA: Diagnosis not present

## 2018-01-31 DIAGNOSIS — D631 Anemia in chronic kidney disease: Secondary | ICD-10-CM | POA: Diagnosis not present

## 2018-02-01 DIAGNOSIS — F79 Unspecified intellectual disabilities: Secondary | ICD-10-CM | POA: Insufficient documentation

## 2018-02-02 DIAGNOSIS — N186 End stage renal disease: Secondary | ICD-10-CM | POA: Diagnosis not present

## 2018-02-02 DIAGNOSIS — N2581 Secondary hyperparathyroidism of renal origin: Secondary | ICD-10-CM | POA: Diagnosis not present

## 2018-02-02 DIAGNOSIS — Z992 Dependence on renal dialysis: Secondary | ICD-10-CM | POA: Diagnosis not present

## 2018-02-02 DIAGNOSIS — D631 Anemia in chronic kidney disease: Secondary | ICD-10-CM | POA: Diagnosis not present

## 2018-02-02 DIAGNOSIS — D509 Iron deficiency anemia, unspecified: Secondary | ICD-10-CM | POA: Diagnosis not present

## 2018-02-04 DIAGNOSIS — Z992 Dependence on renal dialysis: Secondary | ICD-10-CM | POA: Diagnosis not present

## 2018-02-04 DIAGNOSIS — N2581 Secondary hyperparathyroidism of renal origin: Secondary | ICD-10-CM | POA: Diagnosis not present

## 2018-02-04 DIAGNOSIS — D509 Iron deficiency anemia, unspecified: Secondary | ICD-10-CM | POA: Diagnosis not present

## 2018-02-04 DIAGNOSIS — N186 End stage renal disease: Secondary | ICD-10-CM | POA: Diagnosis not present

## 2018-02-04 DIAGNOSIS — D631 Anemia in chronic kidney disease: Secondary | ICD-10-CM | POA: Diagnosis not present

## 2018-02-06 DIAGNOSIS — F71 Moderate intellectual disabilities: Secondary | ICD-10-CM | POA: Diagnosis not present

## 2018-02-07 DIAGNOSIS — D631 Anemia in chronic kidney disease: Secondary | ICD-10-CM | POA: Diagnosis not present

## 2018-02-07 DIAGNOSIS — D509 Iron deficiency anemia, unspecified: Secondary | ICD-10-CM | POA: Diagnosis not present

## 2018-02-07 DIAGNOSIS — N2581 Secondary hyperparathyroidism of renal origin: Secondary | ICD-10-CM | POA: Diagnosis not present

## 2018-02-07 DIAGNOSIS — Z992 Dependence on renal dialysis: Secondary | ICD-10-CM | POA: Diagnosis not present

## 2018-02-07 DIAGNOSIS — N186 End stage renal disease: Secondary | ICD-10-CM | POA: Diagnosis not present

## 2018-02-09 DIAGNOSIS — D509 Iron deficiency anemia, unspecified: Secondary | ICD-10-CM | POA: Diagnosis not present

## 2018-02-09 DIAGNOSIS — N2581 Secondary hyperparathyroidism of renal origin: Secondary | ICD-10-CM | POA: Diagnosis not present

## 2018-02-09 DIAGNOSIS — D631 Anemia in chronic kidney disease: Secondary | ICD-10-CM | POA: Diagnosis not present

## 2018-02-09 DIAGNOSIS — Z992 Dependence on renal dialysis: Secondary | ICD-10-CM | POA: Diagnosis not present

## 2018-02-09 DIAGNOSIS — N186 End stage renal disease: Secondary | ICD-10-CM | POA: Diagnosis not present

## 2018-02-11 DIAGNOSIS — D509 Iron deficiency anemia, unspecified: Secondary | ICD-10-CM | POA: Diagnosis not present

## 2018-02-11 DIAGNOSIS — Z992 Dependence on renal dialysis: Secondary | ICD-10-CM | POA: Diagnosis not present

## 2018-02-11 DIAGNOSIS — D631 Anemia in chronic kidney disease: Secondary | ICD-10-CM | POA: Diagnosis not present

## 2018-02-11 DIAGNOSIS — N2581 Secondary hyperparathyroidism of renal origin: Secondary | ICD-10-CM | POA: Diagnosis not present

## 2018-02-11 DIAGNOSIS — N186 End stage renal disease: Secondary | ICD-10-CM | POA: Diagnosis not present

## 2018-02-14 DIAGNOSIS — D631 Anemia in chronic kidney disease: Secondary | ICD-10-CM | POA: Diagnosis not present

## 2018-02-14 DIAGNOSIS — Z992 Dependence on renal dialysis: Secondary | ICD-10-CM | POA: Diagnosis not present

## 2018-02-14 DIAGNOSIS — N186 End stage renal disease: Secondary | ICD-10-CM | POA: Diagnosis not present

## 2018-02-14 DIAGNOSIS — N2581 Secondary hyperparathyroidism of renal origin: Secondary | ICD-10-CM | POA: Diagnosis not present

## 2018-02-14 DIAGNOSIS — D509 Iron deficiency anemia, unspecified: Secondary | ICD-10-CM | POA: Diagnosis not present

## 2018-02-16 DIAGNOSIS — N186 End stage renal disease: Secondary | ICD-10-CM | POA: Diagnosis not present

## 2018-02-16 DIAGNOSIS — Z992 Dependence on renal dialysis: Secondary | ICD-10-CM | POA: Diagnosis not present

## 2018-02-16 DIAGNOSIS — N2581 Secondary hyperparathyroidism of renal origin: Secondary | ICD-10-CM | POA: Diagnosis not present

## 2018-02-16 DIAGNOSIS — D509 Iron deficiency anemia, unspecified: Secondary | ICD-10-CM | POA: Diagnosis not present

## 2018-02-16 DIAGNOSIS — D631 Anemia in chronic kidney disease: Secondary | ICD-10-CM | POA: Diagnosis not present

## 2018-02-18 DIAGNOSIS — D509 Iron deficiency anemia, unspecified: Secondary | ICD-10-CM | POA: Diagnosis not present

## 2018-02-18 DIAGNOSIS — N186 End stage renal disease: Secondary | ICD-10-CM | POA: Diagnosis not present

## 2018-02-18 DIAGNOSIS — D631 Anemia in chronic kidney disease: Secondary | ICD-10-CM | POA: Diagnosis not present

## 2018-02-18 DIAGNOSIS — Z992 Dependence on renal dialysis: Secondary | ICD-10-CM | POA: Diagnosis not present

## 2018-02-18 DIAGNOSIS — N2581 Secondary hyperparathyroidism of renal origin: Secondary | ICD-10-CM | POA: Diagnosis not present

## 2018-02-21 DIAGNOSIS — D509 Iron deficiency anemia, unspecified: Secondary | ICD-10-CM | POA: Diagnosis not present

## 2018-02-21 DIAGNOSIS — N2581 Secondary hyperparathyroidism of renal origin: Secondary | ICD-10-CM | POA: Diagnosis not present

## 2018-02-21 DIAGNOSIS — N186 End stage renal disease: Secondary | ICD-10-CM | POA: Diagnosis not present

## 2018-02-21 DIAGNOSIS — Z992 Dependence on renal dialysis: Secondary | ICD-10-CM | POA: Diagnosis not present

## 2018-02-21 DIAGNOSIS — D631 Anemia in chronic kidney disease: Secondary | ICD-10-CM | POA: Diagnosis not present

## 2018-02-23 DIAGNOSIS — N186 End stage renal disease: Secondary | ICD-10-CM | POA: Diagnosis not present

## 2018-02-23 DIAGNOSIS — Z992 Dependence on renal dialysis: Secondary | ICD-10-CM | POA: Diagnosis not present

## 2018-02-23 DIAGNOSIS — D631 Anemia in chronic kidney disease: Secondary | ICD-10-CM | POA: Diagnosis not present

## 2018-02-23 DIAGNOSIS — N2581 Secondary hyperparathyroidism of renal origin: Secondary | ICD-10-CM | POA: Diagnosis not present

## 2018-02-23 DIAGNOSIS — D509 Iron deficiency anemia, unspecified: Secondary | ICD-10-CM | POA: Diagnosis not present

## 2018-02-24 DIAGNOSIS — I158 Other secondary hypertension: Secondary | ICD-10-CM | POA: Diagnosis not present

## 2018-02-24 DIAGNOSIS — Z992 Dependence on renal dialysis: Secondary | ICD-10-CM | POA: Diagnosis not present

## 2018-02-24 DIAGNOSIS — N186 End stage renal disease: Secondary | ICD-10-CM | POA: Diagnosis not present

## 2018-02-25 DIAGNOSIS — Z992 Dependence on renal dialysis: Secondary | ICD-10-CM | POA: Diagnosis not present

## 2018-02-25 DIAGNOSIS — N186 End stage renal disease: Secondary | ICD-10-CM | POA: Diagnosis not present

## 2018-02-25 DIAGNOSIS — N2581 Secondary hyperparathyroidism of renal origin: Secondary | ICD-10-CM | POA: Diagnosis not present

## 2018-02-25 DIAGNOSIS — D509 Iron deficiency anemia, unspecified: Secondary | ICD-10-CM | POA: Diagnosis not present

## 2018-02-28 DIAGNOSIS — Z992 Dependence on renal dialysis: Secondary | ICD-10-CM | POA: Diagnosis not present

## 2018-02-28 DIAGNOSIS — N186 End stage renal disease: Secondary | ICD-10-CM | POA: Diagnosis not present

## 2018-02-28 DIAGNOSIS — D509 Iron deficiency anemia, unspecified: Secondary | ICD-10-CM | POA: Diagnosis not present

## 2018-02-28 DIAGNOSIS — N2581 Secondary hyperparathyroidism of renal origin: Secondary | ICD-10-CM | POA: Diagnosis not present

## 2018-03-02 DIAGNOSIS — N186 End stage renal disease: Secondary | ICD-10-CM | POA: Diagnosis not present

## 2018-03-02 DIAGNOSIS — D509 Iron deficiency anemia, unspecified: Secondary | ICD-10-CM | POA: Diagnosis not present

## 2018-03-02 DIAGNOSIS — N2581 Secondary hyperparathyroidism of renal origin: Secondary | ICD-10-CM | POA: Diagnosis not present

## 2018-03-02 DIAGNOSIS — Z992 Dependence on renal dialysis: Secondary | ICD-10-CM | POA: Diagnosis not present

## 2018-03-03 ENCOUNTER — Encounter: Payer: Self-pay | Admitting: Family Medicine

## 2018-03-03 ENCOUNTER — Other Ambulatory Visit: Payer: Self-pay

## 2018-03-03 ENCOUNTER — Ambulatory Visit (INDEPENDENT_AMBULATORY_CARE_PROVIDER_SITE_OTHER): Payer: Medicare Other | Admitting: Family Medicine

## 2018-03-03 DIAGNOSIS — R112 Nausea with vomiting, unspecified: Secondary | ICD-10-CM | POA: Diagnosis not present

## 2018-03-03 NOTE — Patient Instructions (Addendum)
Thank you for coming to see me today. It was a pleasure! Today we talked about:   Your mole looks on your leg looks benign, but please monitor the area for any changes.   For you nausea and vomiting, I am glad that you are not having any of those symptoms at this time. If it worsens or continues you may need to follow up with the gastroenterologist again to pursue further workup.   Please follow-up with your regular doctor as needed.  If you have any questions or concerns, please do not hesitate to call the office at [phone].  Take Care,   [place] Dreama Kuna, DO

## 2018-03-03 NOTE — Progress Notes (Signed)
  Subjective:    Patient ID: EBBA GOLL, female    DOB: 02-24-1986, 32 y.o.   MRN: [id number]   CC: nausea and vomiting  HPI: Nausea and Vomiting: Patient has had chronic off an on nausea for years. She has been evaluated by GI with EGD (2017) which was grossly normal and next step was to be gastric emptying study. Mom reports that's e has not vomited in 1 week and that the scheduled this appointment before she got better.  ROS: no fevers, N, V, D, constipaiton  Smoking status reviewed  ROS: 10 point ROS is otherwise negative, except as mentioned in HPI  Patient Active Problem List   Diagnosis Date Noted  . Neck pain 05/04/2017  . GERD (gastroesophageal reflux disease) 05/04/2017  . Paranoia ([place]) 10/16/2015  . Hyperphosphatemia 07/11/2015  . Affective disorder ([place]) 07/06/2015  . Rectal bleeding 03/04/2015  . 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 Non-intractable vomiting with nausea 02/12/2015  . ESRD on dialysis ([place])   . Nystagmus 08/22/2013  . 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 Headache 07/13/2011  . ECZEMA, ATOPIC 11/05/2009  . Hypothyroidism 03/12/2009  . Obesity, Class II, BMI 35-39.9 02/26/2009  . Essential hypertension 02/26/2009  . Iron deficiency anemia 05/20/2008  . Anxiety state 06/23/2006  . MENTAL RETARDATION 06/23/2006     Objective:  BP 125/65   Pulse 90   Temp 98.6 F (37 C) (Oral)   Wt 137 lb (62.1 kg)   LMP 02/07/2018   SpO2 99%   BMI 23.52 kg/m  Vitals and nursing note reviewed  General: NAD, pleasant Cardiac: RRR, normal heart sounds, no murmurs Respiratory: CTAB, normal effort Abdomen: soft, nontender, nondistended Extremities: no edema or cyanosis. WWP. Skin: warm and dry, no rashes noted Neuro: alert and oriented, no focal deficits  Assessment & Plan:    Non-intractable vomiting with nausea Resolved. Return precautions discussed and if patient prefers further workup, may consider gastric emptying study per last note  from GI after normal EGD in 2017.     [place] Kayne Yuhas, DO Family Medicine Resident PGY-2

## 2018-03-04 DIAGNOSIS — N186 End stage renal disease: Secondary | ICD-10-CM | POA: Diagnosis not present

## 2018-03-04 DIAGNOSIS — D509 Iron deficiency anemia, unspecified: Secondary | ICD-10-CM | POA: Diagnosis not present

## 2018-03-04 DIAGNOSIS — Z992 Dependence on renal dialysis: Secondary | ICD-10-CM | POA: Diagnosis not present

## 2018-03-04 DIAGNOSIS — N2581 Secondary hyperparathyroidism of renal origin: Secondary | ICD-10-CM | POA: Diagnosis not present

## 2018-03-04 NOTE — Assessment & Plan Note (Addendum)
Resolved. Return precautions discussed and if patient prefers further workup, may consider gastric emptying study per last note from GI after normal EGD in 2017.

## 2018-03-07 DIAGNOSIS — N2581 Secondary hyperparathyroidism of renal origin: Secondary | ICD-10-CM | POA: Diagnosis not present

## 2018-03-07 DIAGNOSIS — Z992 Dependence on renal dialysis: Secondary | ICD-10-CM | POA: Diagnosis not present

## 2018-03-07 DIAGNOSIS — D509 Iron deficiency anemia, unspecified: Secondary | ICD-10-CM | POA: Diagnosis not present

## 2018-03-07 DIAGNOSIS — N186 End stage renal disease: Secondary | ICD-10-CM | POA: Diagnosis not present

## 2018-03-09 DIAGNOSIS — N2581 Secondary hyperparathyroidism of renal origin: Secondary | ICD-10-CM | POA: Diagnosis not present

## 2018-03-09 DIAGNOSIS — Z992 Dependence on renal dialysis: Secondary | ICD-10-CM | POA: Diagnosis not present

## 2018-03-09 DIAGNOSIS — D509 Iron deficiency anemia, unspecified: Secondary | ICD-10-CM | POA: Diagnosis not present

## 2018-03-09 DIAGNOSIS — N186 End stage renal disease: Secondary | ICD-10-CM | POA: Diagnosis not present

## 2018-03-11 DIAGNOSIS — Z992 Dependence on renal dialysis: Secondary | ICD-10-CM | POA: Diagnosis not present

## 2018-03-11 DIAGNOSIS — N186 End stage renal disease: Secondary | ICD-10-CM | POA: Diagnosis not present

## 2018-03-11 DIAGNOSIS — D509 Iron deficiency anemia, unspecified: Secondary | ICD-10-CM | POA: Diagnosis not present

## 2018-03-11 DIAGNOSIS — N2581 Secondary hyperparathyroidism of renal origin: Secondary | ICD-10-CM | POA: Diagnosis not present

## 2018-03-14 DIAGNOSIS — N186 End stage renal disease: Secondary | ICD-10-CM | POA: Diagnosis not present

## 2018-03-14 DIAGNOSIS — N2581 Secondary hyperparathyroidism of renal origin: Secondary | ICD-10-CM | POA: Diagnosis not present

## 2018-03-14 DIAGNOSIS — Z992 Dependence on renal dialysis: Secondary | ICD-10-CM | POA: Diagnosis not present

## 2018-03-14 DIAGNOSIS — D509 Iron deficiency anemia, unspecified: Secondary | ICD-10-CM | POA: Diagnosis not present

## 2018-03-16 DIAGNOSIS — N2581 Secondary hyperparathyroidism of renal origin: Secondary | ICD-10-CM | POA: Diagnosis not present

## 2018-03-16 DIAGNOSIS — Z992 Dependence on renal dialysis: Secondary | ICD-10-CM | POA: Diagnosis not present

## 2018-03-16 DIAGNOSIS — N186 End stage renal disease: Secondary | ICD-10-CM | POA: Diagnosis not present

## 2018-03-16 DIAGNOSIS — D509 Iron deficiency anemia, unspecified: Secondary | ICD-10-CM | POA: Diagnosis not present

## 2018-03-18 DIAGNOSIS — N186 End stage renal disease: Secondary | ICD-10-CM | POA: Diagnosis not present

## 2018-03-18 DIAGNOSIS — N2581 Secondary hyperparathyroidism of renal origin: Secondary | ICD-10-CM | POA: Diagnosis not present

## 2018-03-18 DIAGNOSIS — Z992 Dependence on renal dialysis: Secondary | ICD-10-CM | POA: Diagnosis not present

## 2018-03-18 DIAGNOSIS — D509 Iron deficiency anemia, unspecified: Secondary | ICD-10-CM | POA: Diagnosis not present

## 2018-03-20 DIAGNOSIS — N2581 Secondary hyperparathyroidism of renal origin: Secondary | ICD-10-CM | POA: Diagnosis not present

## 2018-03-20 DIAGNOSIS — D509 Iron deficiency anemia, unspecified: Secondary | ICD-10-CM | POA: Diagnosis not present

## 2018-03-20 DIAGNOSIS — Z992 Dependence on renal dialysis: Secondary | ICD-10-CM | POA: Diagnosis not present

## 2018-03-20 DIAGNOSIS — N186 End stage renal disease: Secondary | ICD-10-CM | POA: Diagnosis not present

## 2018-03-22 DIAGNOSIS — N186 End stage renal disease: Secondary | ICD-10-CM | POA: Diagnosis not present

## 2018-03-22 DIAGNOSIS — D509 Iron deficiency anemia, unspecified: Secondary | ICD-10-CM | POA: Diagnosis not present

## 2018-03-22 DIAGNOSIS — N2581 Secondary hyperparathyroidism of renal origin: Secondary | ICD-10-CM | POA: Diagnosis not present

## 2018-03-22 DIAGNOSIS — Z992 Dependence on renal dialysis: Secondary | ICD-10-CM | POA: Diagnosis not present

## 2018-03-25 DIAGNOSIS — D509 Iron deficiency anemia, unspecified: Secondary | ICD-10-CM | POA: Diagnosis not present

## 2018-03-25 DIAGNOSIS — Z992 Dependence on renal dialysis: Secondary | ICD-10-CM | POA: Diagnosis not present

## 2018-03-25 DIAGNOSIS — N2581 Secondary hyperparathyroidism of renal origin: Secondary | ICD-10-CM | POA: Diagnosis not present

## 2018-03-25 DIAGNOSIS — N186 End stage renal disease: Secondary | ICD-10-CM | POA: Diagnosis not present

## 2018-03-26 DIAGNOSIS — N186 End stage renal disease: Secondary | ICD-10-CM | POA: Diagnosis not present

## 2018-03-26 DIAGNOSIS — I158 Other secondary hypertension: Secondary | ICD-10-CM | POA: Diagnosis not present

## 2018-03-26 DIAGNOSIS — Z992 Dependence on renal dialysis: Secondary | ICD-10-CM | POA: Diagnosis not present

## 2018-03-28 DIAGNOSIS — N186 End stage renal disease: Secondary | ICD-10-CM | POA: Diagnosis not present

## 2018-03-28 DIAGNOSIS — N2581 Secondary hyperparathyroidism of renal origin: Secondary | ICD-10-CM | POA: Diagnosis not present

## 2018-03-28 DIAGNOSIS — D509 Iron deficiency anemia, unspecified: Secondary | ICD-10-CM | POA: Diagnosis not present

## 2018-03-28 DIAGNOSIS — D631 Anemia in chronic kidney disease: Secondary | ICD-10-CM | POA: Diagnosis not present

## 2018-03-28 DIAGNOSIS — Z992 Dependence on renal dialysis: Secondary | ICD-10-CM | POA: Diagnosis not present

## 2018-03-30 DIAGNOSIS — Z992 Dependence on renal dialysis: Secondary | ICD-10-CM | POA: Diagnosis not present

## 2018-03-30 DIAGNOSIS — D509 Iron deficiency anemia, unspecified: Secondary | ICD-10-CM | POA: Diagnosis not present

## 2018-03-30 DIAGNOSIS — D631 Anemia in chronic kidney disease: Secondary | ICD-10-CM | POA: Diagnosis not present

## 2018-03-30 DIAGNOSIS — N2581 Secondary hyperparathyroidism of renal origin: Secondary | ICD-10-CM | POA: Diagnosis not present

## 2018-03-30 DIAGNOSIS — N186 End stage renal disease: Secondary | ICD-10-CM | POA: Diagnosis not present

## 2018-04-01 DIAGNOSIS — N2581 Secondary hyperparathyroidism of renal origin: Secondary | ICD-10-CM | POA: Diagnosis not present

## 2018-04-01 DIAGNOSIS — D509 Iron deficiency anemia, unspecified: Secondary | ICD-10-CM | POA: Diagnosis not present

## 2018-04-01 DIAGNOSIS — Z992 Dependence on renal dialysis: Secondary | ICD-10-CM | POA: Diagnosis not present

## 2018-04-01 DIAGNOSIS — D631 Anemia in chronic kidney disease: Secondary | ICD-10-CM | POA: Diagnosis not present

## 2018-04-01 DIAGNOSIS — N186 End stage renal disease: Secondary | ICD-10-CM | POA: Diagnosis not present

## 2018-04-04 DIAGNOSIS — N2581 Secondary hyperparathyroidism of renal origin: Secondary | ICD-10-CM | POA: Diagnosis not present

## 2018-04-04 DIAGNOSIS — N186 End stage renal disease: Secondary | ICD-10-CM | POA: Diagnosis not present

## 2018-04-04 DIAGNOSIS — D631 Anemia in chronic kidney disease: Secondary | ICD-10-CM | POA: Diagnosis not present

## 2018-04-04 DIAGNOSIS — Z992 Dependence on renal dialysis: Secondary | ICD-10-CM | POA: Diagnosis not present

## 2018-04-04 DIAGNOSIS — D509 Iron deficiency anemia, unspecified: Secondary | ICD-10-CM | POA: Diagnosis not present

## 2018-04-06 DIAGNOSIS — Z992 Dependence on renal dialysis: Secondary | ICD-10-CM | POA: Diagnosis not present

## 2018-04-06 DIAGNOSIS — N2581 Secondary hyperparathyroidism of renal origin: Secondary | ICD-10-CM | POA: Diagnosis not present

## 2018-04-06 DIAGNOSIS — D509 Iron deficiency anemia, unspecified: Secondary | ICD-10-CM | POA: Diagnosis not present

## 2018-04-06 DIAGNOSIS — N186 End stage renal disease: Secondary | ICD-10-CM | POA: Diagnosis not present

## 2018-04-06 DIAGNOSIS — D631 Anemia in chronic kidney disease: Secondary | ICD-10-CM | POA: Diagnosis not present

## 2018-04-08 DIAGNOSIS — N186 End stage renal disease: Secondary | ICD-10-CM | POA: Diagnosis not present

## 2018-04-08 DIAGNOSIS — D509 Iron deficiency anemia, unspecified: Secondary | ICD-10-CM | POA: Diagnosis not present

## 2018-04-08 DIAGNOSIS — Z992 Dependence on renal dialysis: Secondary | ICD-10-CM | POA: Diagnosis not present

## 2018-04-08 DIAGNOSIS — D631 Anemia in chronic kidney disease: Secondary | ICD-10-CM | POA: Diagnosis not present

## 2018-04-08 DIAGNOSIS — N2581 Secondary hyperparathyroidism of renal origin: Secondary | ICD-10-CM | POA: Diagnosis not present

## 2018-04-11 DIAGNOSIS — D631 Anemia in chronic kidney disease: Secondary | ICD-10-CM | POA: Diagnosis not present

## 2018-04-11 DIAGNOSIS — D509 Iron deficiency anemia, unspecified: Secondary | ICD-10-CM | POA: Diagnosis not present

## 2018-04-11 DIAGNOSIS — N2581 Secondary hyperparathyroidism of renal origin: Secondary | ICD-10-CM | POA: Diagnosis not present

## 2018-04-11 DIAGNOSIS — N186 End stage renal disease: Secondary | ICD-10-CM | POA: Diagnosis not present

## 2018-04-11 DIAGNOSIS — Z992 Dependence on renal dialysis: Secondary | ICD-10-CM | POA: Diagnosis not present

## 2018-04-13 DIAGNOSIS — N186 End stage renal disease: Secondary | ICD-10-CM | POA: Diagnosis not present

## 2018-04-13 DIAGNOSIS — Z992 Dependence on renal dialysis: Secondary | ICD-10-CM | POA: Diagnosis not present

## 2018-04-13 DIAGNOSIS — D631 Anemia in chronic kidney disease: Secondary | ICD-10-CM | POA: Diagnosis not present

## 2018-04-13 DIAGNOSIS — N2581 Secondary hyperparathyroidism of renal origin: Secondary | ICD-10-CM | POA: Diagnosis not present

## 2018-04-13 DIAGNOSIS — D509 Iron deficiency anemia, unspecified: Secondary | ICD-10-CM | POA: Diagnosis not present

## 2018-04-15 DIAGNOSIS — D509 Iron deficiency anemia, unspecified: Secondary | ICD-10-CM | POA: Diagnosis not present

## 2018-04-15 DIAGNOSIS — N186 End stage renal disease: Secondary | ICD-10-CM | POA: Diagnosis not present

## 2018-04-15 DIAGNOSIS — Z992 Dependence on renal dialysis: Secondary | ICD-10-CM | POA: Diagnosis not present

## 2018-04-15 DIAGNOSIS — N2581 Secondary hyperparathyroidism of renal origin: Secondary | ICD-10-CM | POA: Diagnosis not present

## 2018-04-15 DIAGNOSIS — D631 Anemia in chronic kidney disease: Secondary | ICD-10-CM | POA: Diagnosis not present

## 2018-04-17 DIAGNOSIS — N186 End stage renal disease: Secondary | ICD-10-CM | POA: Diagnosis not present

## 2018-04-17 DIAGNOSIS — Z992 Dependence on renal dialysis: Secondary | ICD-10-CM | POA: Diagnosis not present

## 2018-04-17 DIAGNOSIS — D631 Anemia in chronic kidney disease: Secondary | ICD-10-CM | POA: Diagnosis not present

## 2018-04-17 DIAGNOSIS — N2581 Secondary hyperparathyroidism of renal origin: Secondary | ICD-10-CM | POA: Diagnosis not present

## 2018-04-17 DIAGNOSIS — D509 Iron deficiency anemia, unspecified: Secondary | ICD-10-CM | POA: Diagnosis not present

## 2018-04-20 DIAGNOSIS — N186 End stage renal disease: Secondary | ICD-10-CM | POA: Diagnosis not present

## 2018-04-20 DIAGNOSIS — Z992 Dependence on renal dialysis: Secondary | ICD-10-CM | POA: Diagnosis not present

## 2018-04-20 DIAGNOSIS — D631 Anemia in chronic kidney disease: Secondary | ICD-10-CM | POA: Diagnosis not present

## 2018-04-20 DIAGNOSIS — N2581 Secondary hyperparathyroidism of renal origin: Secondary | ICD-10-CM | POA: Diagnosis not present

## 2018-04-20 DIAGNOSIS — D509 Iron deficiency anemia, unspecified: Secondary | ICD-10-CM | POA: Diagnosis not present

## 2018-04-22 DIAGNOSIS — D631 Anemia in chronic kidney disease: Secondary | ICD-10-CM | POA: Diagnosis not present

## 2018-04-22 DIAGNOSIS — N2581 Secondary hyperparathyroidism of renal origin: Secondary | ICD-10-CM | POA: Diagnosis not present

## 2018-04-22 DIAGNOSIS — Z992 Dependence on renal dialysis: Secondary | ICD-10-CM | POA: Diagnosis not present

## 2018-04-22 DIAGNOSIS — D509 Iron deficiency anemia, unspecified: Secondary | ICD-10-CM | POA: Diagnosis not present

## 2018-04-22 DIAGNOSIS — N186 End stage renal disease: Secondary | ICD-10-CM | POA: Diagnosis not present

## 2018-04-24 DIAGNOSIS — N186 End stage renal disease: Secondary | ICD-10-CM | POA: Diagnosis not present

## 2018-04-24 DIAGNOSIS — D631 Anemia in chronic kidney disease: Secondary | ICD-10-CM | POA: Diagnosis not present

## 2018-04-24 DIAGNOSIS — Z992 Dependence on renal dialysis: Secondary | ICD-10-CM | POA: Diagnosis not present

## 2018-04-24 DIAGNOSIS — N2581 Secondary hyperparathyroidism of renal origin: Secondary | ICD-10-CM | POA: Diagnosis not present

## 2018-04-24 DIAGNOSIS — D509 Iron deficiency anemia, unspecified: Secondary | ICD-10-CM | POA: Diagnosis not present

## 2018-04-26 DIAGNOSIS — Z992 Dependence on renal dialysis: Secondary | ICD-10-CM | POA: Diagnosis not present

## 2018-04-26 DIAGNOSIS — N186 End stage renal disease: Secondary | ICD-10-CM | POA: Diagnosis not present

## 2018-04-26 DIAGNOSIS — I158 Other secondary hypertension: Secondary | ICD-10-CM | POA: Diagnosis not present

## 2018-04-27 DIAGNOSIS — Z992 Dependence on renal dialysis: Secondary | ICD-10-CM | POA: Diagnosis not present

## 2018-04-27 DIAGNOSIS — D509 Iron deficiency anemia, unspecified: Secondary | ICD-10-CM | POA: Diagnosis not present

## 2018-04-27 DIAGNOSIS — N186 End stage renal disease: Secondary | ICD-10-CM | POA: Diagnosis not present

## 2018-04-27 DIAGNOSIS — N2581 Secondary hyperparathyroidism of renal origin: Secondary | ICD-10-CM | POA: Diagnosis not present

## 2018-04-27 DIAGNOSIS — D631 Anemia in chronic kidney disease: Secondary | ICD-10-CM | POA: Diagnosis not present

## 2018-04-29 DIAGNOSIS — Z992 Dependence on renal dialysis: Secondary | ICD-10-CM | POA: Diagnosis not present

## 2018-04-29 DIAGNOSIS — D631 Anemia in chronic kidney disease: Secondary | ICD-10-CM | POA: Diagnosis not present

## 2018-04-29 DIAGNOSIS — N186 End stage renal disease: Secondary | ICD-10-CM | POA: Diagnosis not present

## 2018-04-29 DIAGNOSIS — D509 Iron deficiency anemia, unspecified: Secondary | ICD-10-CM | POA: Diagnosis not present

## 2018-04-29 DIAGNOSIS — N2581 Secondary hyperparathyroidism of renal origin: Secondary | ICD-10-CM | POA: Diagnosis not present

## 2018-05-02 DIAGNOSIS — N186 End stage renal disease: Secondary | ICD-10-CM | POA: Diagnosis not present

## 2018-05-02 DIAGNOSIS — D509 Iron deficiency anemia, unspecified: Secondary | ICD-10-CM | POA: Diagnosis not present

## 2018-05-02 DIAGNOSIS — Z992 Dependence on renal dialysis: Secondary | ICD-10-CM | POA: Diagnosis not present

## 2018-05-02 DIAGNOSIS — N2581 Secondary hyperparathyroidism of renal origin: Secondary | ICD-10-CM | POA: Diagnosis not present

## 2018-05-02 DIAGNOSIS — D631 Anemia in chronic kidney disease: Secondary | ICD-10-CM | POA: Diagnosis not present

## 2018-05-04 DIAGNOSIS — Z992 Dependence on renal dialysis: Secondary | ICD-10-CM | POA: Diagnosis not present

## 2018-05-04 DIAGNOSIS — N2581 Secondary hyperparathyroidism of renal origin: Secondary | ICD-10-CM | POA: Diagnosis not present

## 2018-05-04 DIAGNOSIS — D509 Iron deficiency anemia, unspecified: Secondary | ICD-10-CM | POA: Diagnosis not present

## 2018-05-04 DIAGNOSIS — D631 Anemia in chronic kidney disease: Secondary | ICD-10-CM | POA: Diagnosis not present

## 2018-05-04 DIAGNOSIS — N186 End stage renal disease: Secondary | ICD-10-CM | POA: Diagnosis not present

## 2018-05-05 ENCOUNTER — Ambulatory Visit: Payer: Medicare Other | Admitting: Family Medicine

## 2018-05-06 DIAGNOSIS — D631 Anemia in chronic kidney disease: Secondary | ICD-10-CM | POA: Diagnosis not present

## 2018-05-06 DIAGNOSIS — Z992 Dependence on renal dialysis: Secondary | ICD-10-CM | POA: Diagnosis not present

## 2018-05-06 DIAGNOSIS — D509 Iron deficiency anemia, unspecified: Secondary | ICD-10-CM | POA: Diagnosis not present

## 2018-05-06 DIAGNOSIS — N186 End stage renal disease: Secondary | ICD-10-CM | POA: Diagnosis not present

## 2018-05-06 DIAGNOSIS — N2581 Secondary hyperparathyroidism of renal origin: Secondary | ICD-10-CM | POA: Diagnosis not present

## 2018-05-07 ENCOUNTER — Other Ambulatory Visit: Payer: Self-pay | Admitting: Family Medicine

## 2018-05-09 DIAGNOSIS — D509 Iron deficiency anemia, unspecified: Secondary | ICD-10-CM | POA: Diagnosis not present

## 2018-05-09 DIAGNOSIS — Z992 Dependence on renal dialysis: Secondary | ICD-10-CM | POA: Diagnosis not present

## 2018-05-09 DIAGNOSIS — N2581 Secondary hyperparathyroidism of renal origin: Secondary | ICD-10-CM | POA: Diagnosis not present

## 2018-05-09 DIAGNOSIS — N186 End stage renal disease: Secondary | ICD-10-CM | POA: Diagnosis not present

## 2018-05-09 DIAGNOSIS — D631 Anemia in chronic kidney disease: Secondary | ICD-10-CM | POA: Diagnosis not present

## 2018-05-11 DIAGNOSIS — Z992 Dependence on renal dialysis: Secondary | ICD-10-CM | POA: Diagnosis not present

## 2018-05-11 DIAGNOSIS — N2581 Secondary hyperparathyroidism of renal origin: Secondary | ICD-10-CM | POA: Diagnosis not present

## 2018-05-11 DIAGNOSIS — D509 Iron deficiency anemia, unspecified: Secondary | ICD-10-CM | POA: Diagnosis not present

## 2018-05-11 DIAGNOSIS — N186 End stage renal disease: Secondary | ICD-10-CM | POA: Diagnosis not present

## 2018-05-11 DIAGNOSIS — D631 Anemia in chronic kidney disease: Secondary | ICD-10-CM | POA: Diagnosis not present

## 2018-05-13 DIAGNOSIS — N2581 Secondary hyperparathyroidism of renal origin: Secondary | ICD-10-CM | POA: Diagnosis not present

## 2018-05-13 DIAGNOSIS — D509 Iron deficiency anemia, unspecified: Secondary | ICD-10-CM | POA: Diagnosis not present

## 2018-05-13 DIAGNOSIS — Z992 Dependence on renal dialysis: Secondary | ICD-10-CM | POA: Diagnosis not present

## 2018-05-13 DIAGNOSIS — D631 Anemia in chronic kidney disease: Secondary | ICD-10-CM | POA: Diagnosis not present

## 2018-05-13 DIAGNOSIS — N186 End stage renal disease: Secondary | ICD-10-CM | POA: Diagnosis not present

## 2018-05-16 DIAGNOSIS — N186 End stage renal disease: Secondary | ICD-10-CM | POA: Diagnosis not present

## 2018-05-16 DIAGNOSIS — Z992 Dependence on renal dialysis: Secondary | ICD-10-CM | POA: Diagnosis not present

## 2018-05-16 DIAGNOSIS — D509 Iron deficiency anemia, unspecified: Secondary | ICD-10-CM | POA: Diagnosis not present

## 2018-05-16 DIAGNOSIS — D631 Anemia in chronic kidney disease: Secondary | ICD-10-CM | POA: Diagnosis not present

## 2018-05-16 DIAGNOSIS — N2581 Secondary hyperparathyroidism of renal origin: Secondary | ICD-10-CM | POA: Diagnosis not present

## 2018-05-18 DIAGNOSIS — D509 Iron deficiency anemia, unspecified: Secondary | ICD-10-CM | POA: Diagnosis not present

## 2018-05-18 DIAGNOSIS — N2581 Secondary hyperparathyroidism of renal origin: Secondary | ICD-10-CM | POA: Diagnosis not present

## 2018-05-18 DIAGNOSIS — D631 Anemia in chronic kidney disease: Secondary | ICD-10-CM | POA: Diagnosis not present

## 2018-05-18 DIAGNOSIS — N186 End stage renal disease: Secondary | ICD-10-CM | POA: Diagnosis not present

## 2018-05-18 DIAGNOSIS — Z992 Dependence on renal dialysis: Secondary | ICD-10-CM | POA: Diagnosis not present

## 2018-05-19 ENCOUNTER — Ambulatory Visit: Payer: Medicare Other | Admitting: Family Medicine

## 2018-05-20 DIAGNOSIS — N186 End stage renal disease: Secondary | ICD-10-CM | POA: Diagnosis not present

## 2018-05-20 DIAGNOSIS — Z992 Dependence on renal dialysis: Secondary | ICD-10-CM | POA: Diagnosis not present

## 2018-05-20 DIAGNOSIS — D631 Anemia in chronic kidney disease: Secondary | ICD-10-CM | POA: Diagnosis not present

## 2018-05-20 DIAGNOSIS — D509 Iron deficiency anemia, unspecified: Secondary | ICD-10-CM | POA: Diagnosis not present

## 2018-05-20 DIAGNOSIS — N2581 Secondary hyperparathyroidism of renal origin: Secondary | ICD-10-CM | POA: Diagnosis not present

## 2018-05-23 DIAGNOSIS — N186 End stage renal disease: Secondary | ICD-10-CM | POA: Diagnosis not present

## 2018-05-23 DIAGNOSIS — D631 Anemia in chronic kidney disease: Secondary | ICD-10-CM | POA: Diagnosis not present

## 2018-05-23 DIAGNOSIS — N2581 Secondary hyperparathyroidism of renal origin: Secondary | ICD-10-CM | POA: Diagnosis not present

## 2018-05-23 DIAGNOSIS — Z992 Dependence on renal dialysis: Secondary | ICD-10-CM | POA: Diagnosis not present

## 2018-05-23 DIAGNOSIS — D509 Iron deficiency anemia, unspecified: Secondary | ICD-10-CM | POA: Diagnosis not present

## 2018-05-25 DIAGNOSIS — D631 Anemia in chronic kidney disease: Secondary | ICD-10-CM | POA: Diagnosis not present

## 2018-05-25 DIAGNOSIS — N186 End stage renal disease: Secondary | ICD-10-CM | POA: Diagnosis not present

## 2018-05-25 DIAGNOSIS — N2581 Secondary hyperparathyroidism of renal origin: Secondary | ICD-10-CM | POA: Diagnosis not present

## 2018-05-25 DIAGNOSIS — Z992 Dependence on renal dialysis: Secondary | ICD-10-CM | POA: Diagnosis not present

## 2018-05-25 DIAGNOSIS — D509 Iron deficiency anemia, unspecified: Secondary | ICD-10-CM | POA: Diagnosis not present

## 2018-05-27 DIAGNOSIS — I158 Other secondary hypertension: Secondary | ICD-10-CM | POA: Diagnosis not present

## 2018-05-27 DIAGNOSIS — Z992 Dependence on renal dialysis: Secondary | ICD-10-CM | POA: Diagnosis not present

## 2018-05-27 DIAGNOSIS — N2581 Secondary hyperparathyroidism of renal origin: Secondary | ICD-10-CM | POA: Diagnosis not present

## 2018-05-27 DIAGNOSIS — N186 End stage renal disease: Secondary | ICD-10-CM | POA: Diagnosis not present

## 2018-05-30 DIAGNOSIS — N186 End stage renal disease: Secondary | ICD-10-CM | POA: Diagnosis not present

## 2018-05-30 DIAGNOSIS — Z992 Dependence on renal dialysis: Secondary | ICD-10-CM | POA: Diagnosis not present

## 2018-05-30 DIAGNOSIS — N2581 Secondary hyperparathyroidism of renal origin: Secondary | ICD-10-CM | POA: Diagnosis not present

## 2018-05-31 DIAGNOSIS — F329 Major depressive disorder, single episode, unspecified: Secondary | ICD-10-CM | POA: Diagnosis not present

## 2018-06-01 DIAGNOSIS — N2581 Secondary hyperparathyroidism of renal origin: Secondary | ICD-10-CM | POA: Diagnosis not present

## 2018-06-01 DIAGNOSIS — Z992 Dependence on renal dialysis: Secondary | ICD-10-CM | POA: Diagnosis not present

## 2018-06-01 DIAGNOSIS — N186 End stage renal disease: Secondary | ICD-10-CM | POA: Diagnosis not present

## 2018-06-03 DIAGNOSIS — Z992 Dependence on renal dialysis: Secondary | ICD-10-CM | POA: Diagnosis not present

## 2018-06-03 DIAGNOSIS — N2581 Secondary hyperparathyroidism of renal origin: Secondary | ICD-10-CM | POA: Diagnosis not present

## 2018-06-03 DIAGNOSIS — N186 End stage renal disease: Secondary | ICD-10-CM | POA: Diagnosis not present

## 2018-06-06 DIAGNOSIS — N186 End stage renal disease: Secondary | ICD-10-CM | POA: Diagnosis not present

## 2018-06-06 DIAGNOSIS — N2581 Secondary hyperparathyroidism of renal origin: Secondary | ICD-10-CM | POA: Diagnosis not present

## 2018-06-06 DIAGNOSIS — Z992 Dependence on renal dialysis: Secondary | ICD-10-CM | POA: Diagnosis not present

## 2018-06-08 DIAGNOSIS — Z992 Dependence on renal dialysis: Secondary | ICD-10-CM | POA: Diagnosis not present

## 2018-06-08 DIAGNOSIS — N2581 Secondary hyperparathyroidism of renal origin: Secondary | ICD-10-CM | POA: Diagnosis not present

## 2018-06-08 DIAGNOSIS — N186 End stage renal disease: Secondary | ICD-10-CM | POA: Diagnosis not present

## 2018-06-10 DIAGNOSIS — Z992 Dependence on renal dialysis: Secondary | ICD-10-CM | POA: Diagnosis not present

## 2018-06-10 DIAGNOSIS — N186 End stage renal disease: Secondary | ICD-10-CM | POA: Diagnosis not present

## 2018-06-10 DIAGNOSIS — N2581 Secondary hyperparathyroidism of renal origin: Secondary | ICD-10-CM | POA: Diagnosis not present

## 2018-06-13 DIAGNOSIS — N2581 Secondary hyperparathyroidism of renal origin: Secondary | ICD-10-CM | POA: Diagnosis not present

## 2018-06-13 DIAGNOSIS — N186 End stage renal disease: Secondary | ICD-10-CM | POA: Diagnosis not present

## 2018-06-13 DIAGNOSIS — Z992 Dependence on renal dialysis: Secondary | ICD-10-CM | POA: Diagnosis not present

## 2018-06-15 DIAGNOSIS — Z992 Dependence on renal dialysis: Secondary | ICD-10-CM | POA: Diagnosis not present

## 2018-06-15 DIAGNOSIS — N186 End stage renal disease: Secondary | ICD-10-CM | POA: Diagnosis not present

## 2018-06-15 DIAGNOSIS — N2581 Secondary hyperparathyroidism of renal origin: Secondary | ICD-10-CM | POA: Diagnosis not present

## 2018-06-17 DIAGNOSIS — N186 End stage renal disease: Secondary | ICD-10-CM | POA: Diagnosis not present

## 2018-06-17 DIAGNOSIS — Z992 Dependence on renal dialysis: Secondary | ICD-10-CM | POA: Diagnosis not present

## 2018-06-17 DIAGNOSIS — N2581 Secondary hyperparathyroidism of renal origin: Secondary | ICD-10-CM | POA: Diagnosis not present

## 2018-06-20 DIAGNOSIS — N186 End stage renal disease: Secondary | ICD-10-CM | POA: Diagnosis not present

## 2018-06-20 DIAGNOSIS — N2581 Secondary hyperparathyroidism of renal origin: Secondary | ICD-10-CM | POA: Diagnosis not present

## 2018-06-20 DIAGNOSIS — Z992 Dependence on renal dialysis: Secondary | ICD-10-CM | POA: Diagnosis not present

## 2018-06-22 DIAGNOSIS — N2581 Secondary hyperparathyroidism of renal origin: Secondary | ICD-10-CM | POA: Diagnosis not present

## 2018-06-22 DIAGNOSIS — N186 End stage renal disease: Secondary | ICD-10-CM | POA: Diagnosis not present

## 2018-06-22 DIAGNOSIS — Z992 Dependence on renal dialysis: Secondary | ICD-10-CM | POA: Diagnosis not present

## 2018-06-24 DIAGNOSIS — N186 End stage renal disease: Secondary | ICD-10-CM | POA: Diagnosis not present

## 2018-06-24 DIAGNOSIS — Z992 Dependence on renal dialysis: Secondary | ICD-10-CM | POA: Diagnosis not present

## 2018-06-24 DIAGNOSIS — N2581 Secondary hyperparathyroidism of renal origin: Secondary | ICD-10-CM | POA: Diagnosis not present

## 2018-06-25 DIAGNOSIS — N186 End stage renal disease: Secondary | ICD-10-CM | POA: Diagnosis not present

## 2018-06-25 DIAGNOSIS — Z992 Dependence on renal dialysis: Secondary | ICD-10-CM | POA: Diagnosis not present

## 2018-06-25 DIAGNOSIS — I158 Other secondary hypertension: Secondary | ICD-10-CM | POA: Diagnosis not present

## 2018-06-27 DIAGNOSIS — N2581 Secondary hyperparathyroidism of renal origin: Secondary | ICD-10-CM | POA: Diagnosis not present

## 2018-06-27 DIAGNOSIS — D631 Anemia in chronic kidney disease: Secondary | ICD-10-CM | POA: Diagnosis not present

## 2018-06-27 DIAGNOSIS — N186 End stage renal disease: Secondary | ICD-10-CM | POA: Diagnosis not present

## 2018-06-27 DIAGNOSIS — Z992 Dependence on renal dialysis: Secondary | ICD-10-CM | POA: Diagnosis not present

## 2018-06-29 DIAGNOSIS — N186 End stage renal disease: Secondary | ICD-10-CM | POA: Diagnosis not present

## 2018-06-29 DIAGNOSIS — N2581 Secondary hyperparathyroidism of renal origin: Secondary | ICD-10-CM | POA: Diagnosis not present

## 2018-06-29 DIAGNOSIS — D631 Anemia in chronic kidney disease: Secondary | ICD-10-CM | POA: Diagnosis not present

## 2018-06-29 DIAGNOSIS — Z992 Dependence on renal dialysis: Secondary | ICD-10-CM | POA: Diagnosis not present

## 2018-07-01 DIAGNOSIS — Z992 Dependence on renal dialysis: Secondary | ICD-10-CM | POA: Diagnosis not present

## 2018-07-01 DIAGNOSIS — N186 End stage renal disease: Secondary | ICD-10-CM | POA: Diagnosis not present

## 2018-07-01 DIAGNOSIS — N2581 Secondary hyperparathyroidism of renal origin: Secondary | ICD-10-CM | POA: Diagnosis not present

## 2018-07-01 DIAGNOSIS — D631 Anemia in chronic kidney disease: Secondary | ICD-10-CM | POA: Diagnosis not present

## 2018-07-04 DIAGNOSIS — N2581 Secondary hyperparathyroidism of renal origin: Secondary | ICD-10-CM | POA: Diagnosis not present

## 2018-07-04 DIAGNOSIS — Z992 Dependence on renal dialysis: Secondary | ICD-10-CM | POA: Diagnosis not present

## 2018-07-04 DIAGNOSIS — N186 End stage renal disease: Secondary | ICD-10-CM | POA: Diagnosis not present

## 2018-07-04 DIAGNOSIS — D631 Anemia in chronic kidney disease: Secondary | ICD-10-CM | POA: Diagnosis not present

## 2018-07-06 DIAGNOSIS — Z992 Dependence on renal dialysis: Secondary | ICD-10-CM | POA: Diagnosis not present

## 2018-07-06 DIAGNOSIS — N2581 Secondary hyperparathyroidism of renal origin: Secondary | ICD-10-CM | POA: Diagnosis not present

## 2018-07-06 DIAGNOSIS — N186 End stage renal disease: Secondary | ICD-10-CM | POA: Diagnosis not present

## 2018-07-06 DIAGNOSIS — D631 Anemia in chronic kidney disease: Secondary | ICD-10-CM | POA: Diagnosis not present

## 2018-07-08 DIAGNOSIS — N2581 Secondary hyperparathyroidism of renal origin: Secondary | ICD-10-CM | POA: Diagnosis not present

## 2018-07-08 DIAGNOSIS — D631 Anemia in chronic kidney disease: Secondary | ICD-10-CM | POA: Diagnosis not present

## 2018-07-08 DIAGNOSIS — Z992 Dependence on renal dialysis: Secondary | ICD-10-CM | POA: Diagnosis not present

## 2018-07-08 DIAGNOSIS — N186 End stage renal disease: Secondary | ICD-10-CM | POA: Diagnosis not present

## 2018-07-11 DIAGNOSIS — Z992 Dependence on renal dialysis: Secondary | ICD-10-CM | POA: Diagnosis not present

## 2018-07-11 DIAGNOSIS — N186 End stage renal disease: Secondary | ICD-10-CM | POA: Diagnosis not present

## 2018-07-11 DIAGNOSIS — D631 Anemia in chronic kidney disease: Secondary | ICD-10-CM | POA: Diagnosis not present

## 2018-07-11 DIAGNOSIS — N2581 Secondary hyperparathyroidism of renal origin: Secondary | ICD-10-CM | POA: Diagnosis not present

## 2018-07-13 ENCOUNTER — Telehealth: Payer: Self-pay

## 2018-07-13 DIAGNOSIS — N2581 Secondary hyperparathyroidism of renal origin: Secondary | ICD-10-CM | POA: Diagnosis not present

## 2018-07-13 DIAGNOSIS — Z992 Dependence on renal dialysis: Secondary | ICD-10-CM | POA: Diagnosis not present

## 2018-07-13 DIAGNOSIS — D631 Anemia in chronic kidney disease: Secondary | ICD-10-CM | POA: Diagnosis not present

## 2018-07-13 DIAGNOSIS — N186 End stage renal disease: Secondary | ICD-10-CM | POA: Diagnosis not present

## 2018-07-13 NOTE — Telephone Encounter (Signed)
Left message for patient to call back regarding appt on 3/23 with Dr. Irish Lack.   WE WILL KEEP APPT. NEED TO DO PRE-SCREENING.

## 2018-07-14 NOTE — Telephone Encounter (Signed)
Upon review, appointment for 3/23 has been cancelled by other.   Left message for patient to call back. Need to ensure that she does not have any Cardiac Sx or need any refills.

## 2018-07-15 DIAGNOSIS — N2581 Secondary hyperparathyroidism of renal origin: Secondary | ICD-10-CM | POA: Diagnosis not present

## 2018-07-15 DIAGNOSIS — Z992 Dependence on renal dialysis: Secondary | ICD-10-CM | POA: Diagnosis not present

## 2018-07-15 DIAGNOSIS — D631 Anemia in chronic kidney disease: Secondary | ICD-10-CM | POA: Diagnosis not present

## 2018-07-15 DIAGNOSIS — N186 End stage renal disease: Secondary | ICD-10-CM | POA: Diagnosis not present

## 2018-07-17 ENCOUNTER — Other Ambulatory Visit: Payer: Self-pay

## 2018-07-17 ENCOUNTER — Ambulatory Visit: Payer: Medicare Other | Admitting: Interventional Cardiology

## 2018-07-17 DIAGNOSIS — F329 Major depressive disorder, single episode, unspecified: Secondary | ICD-10-CM | POA: Diagnosis not present

## 2018-07-17 DIAGNOSIS — F319 Bipolar disorder, unspecified: Secondary | ICD-10-CM | POA: Diagnosis not present

## 2018-07-17 NOTE — Telephone Encounter (Signed)
Left detailed message for patient to call back should she develop any cardiac concerns that need to be addressed.

## 2018-07-18 DIAGNOSIS — N186 End stage renal disease: Secondary | ICD-10-CM | POA: Diagnosis not present

## 2018-07-18 DIAGNOSIS — N2581 Secondary hyperparathyroidism of renal origin: Secondary | ICD-10-CM | POA: Diagnosis not present

## 2018-07-18 DIAGNOSIS — D631 Anemia in chronic kidney disease: Secondary | ICD-10-CM | POA: Diagnosis not present

## 2018-07-18 DIAGNOSIS — Z992 Dependence on renal dialysis: Secondary | ICD-10-CM | POA: Diagnosis not present

## 2018-07-18 MED ORDER — LEVOTHYROXINE SODIUM 112 MCG PO TABS: 112.0000 ug | ORAL_TABLET | Freq: Every day | ORAL | 2 refills | Status: DC

## 2018-07-20 DIAGNOSIS — D631 Anemia in chronic kidney disease: Secondary | ICD-10-CM | POA: Diagnosis not present

## 2018-07-20 DIAGNOSIS — N2581 Secondary hyperparathyroidism of renal origin: Secondary | ICD-10-CM | POA: Diagnosis not present

## 2018-07-20 DIAGNOSIS — Z992 Dependence on renal dialysis: Secondary | ICD-10-CM | POA: Diagnosis not present

## 2018-07-20 DIAGNOSIS — N186 End stage renal disease: Secondary | ICD-10-CM | POA: Diagnosis not present

## 2018-07-22 DIAGNOSIS — D631 Anemia in chronic kidney disease: Secondary | ICD-10-CM | POA: Diagnosis not present

## 2018-07-22 DIAGNOSIS — Z992 Dependence on renal dialysis: Secondary | ICD-10-CM | POA: Diagnosis not present

## 2018-07-22 DIAGNOSIS — N186 End stage renal disease: Secondary | ICD-10-CM | POA: Diagnosis not present

## 2018-07-22 DIAGNOSIS — N2581 Secondary hyperparathyroidism of renal origin: Secondary | ICD-10-CM | POA: Diagnosis not present

## 2018-07-25 DIAGNOSIS — D631 Anemia in chronic kidney disease: Secondary | ICD-10-CM | POA: Diagnosis not present

## 2018-07-25 DIAGNOSIS — N2581 Secondary hyperparathyroidism of renal origin: Secondary | ICD-10-CM | POA: Diagnosis not present

## 2018-07-25 DIAGNOSIS — Z992 Dependence on renal dialysis: Secondary | ICD-10-CM | POA: Diagnosis not present

## 2018-07-25 DIAGNOSIS — N186 End stage renal disease: Secondary | ICD-10-CM | POA: Diagnosis not present

## 2018-07-26 DIAGNOSIS — Z992 Dependence on renal dialysis: Secondary | ICD-10-CM | POA: Diagnosis not present

## 2018-07-26 DIAGNOSIS — N186 End stage renal disease: Secondary | ICD-10-CM | POA: Diagnosis not present

## 2018-07-26 DIAGNOSIS — I158 Other secondary hypertension: Secondary | ICD-10-CM | POA: Diagnosis not present

## 2018-07-27 DIAGNOSIS — N2581 Secondary hyperparathyroidism of renal origin: Secondary | ICD-10-CM | POA: Diagnosis not present

## 2018-07-27 DIAGNOSIS — Z992 Dependence on renal dialysis: Secondary | ICD-10-CM | POA: Diagnosis not present

## 2018-07-27 DIAGNOSIS — N186 End stage renal disease: Secondary | ICD-10-CM | POA: Diagnosis not present

## 2018-07-27 DIAGNOSIS — D631 Anemia in chronic kidney disease: Secondary | ICD-10-CM | POA: Diagnosis not present

## 2018-07-29 DIAGNOSIS — N186 End stage renal disease: Secondary | ICD-10-CM | POA: Diagnosis not present

## 2018-07-29 DIAGNOSIS — D631 Anemia in chronic kidney disease: Secondary | ICD-10-CM | POA: Diagnosis not present

## 2018-07-29 DIAGNOSIS — Z992 Dependence on renal dialysis: Secondary | ICD-10-CM | POA: Diagnosis not present

## 2018-07-29 DIAGNOSIS — N2581 Secondary hyperparathyroidism of renal origin: Secondary | ICD-10-CM | POA: Diagnosis not present

## 2018-08-01 DIAGNOSIS — N186 End stage renal disease: Secondary | ICD-10-CM | POA: Diagnosis not present

## 2018-08-01 DIAGNOSIS — Z992 Dependence on renal dialysis: Secondary | ICD-10-CM | POA: Diagnosis not present

## 2018-08-01 DIAGNOSIS — N2581 Secondary hyperparathyroidism of renal origin: Secondary | ICD-10-CM | POA: Diagnosis not present

## 2018-08-01 DIAGNOSIS — D631 Anemia in chronic kidney disease: Secondary | ICD-10-CM | POA: Diagnosis not present

## 2018-08-03 DIAGNOSIS — Z992 Dependence on renal dialysis: Secondary | ICD-10-CM | POA: Diagnosis not present

## 2018-08-03 DIAGNOSIS — N186 End stage renal disease: Secondary | ICD-10-CM | POA: Diagnosis not present

## 2018-08-03 DIAGNOSIS — D631 Anemia in chronic kidney disease: Secondary | ICD-10-CM | POA: Diagnosis not present

## 2018-08-03 DIAGNOSIS — N2581 Secondary hyperparathyroidism of renal origin: Secondary | ICD-10-CM | POA: Diagnosis not present

## 2018-08-05 DIAGNOSIS — N186 End stage renal disease: Secondary | ICD-10-CM | POA: Diagnosis not present

## 2018-08-05 DIAGNOSIS — Z992 Dependence on renal dialysis: Secondary | ICD-10-CM | POA: Diagnosis not present

## 2018-08-05 DIAGNOSIS — N2581 Secondary hyperparathyroidism of renal origin: Secondary | ICD-10-CM | POA: Diagnosis not present

## 2018-08-05 DIAGNOSIS — D631 Anemia in chronic kidney disease: Secondary | ICD-10-CM | POA: Diagnosis not present

## 2018-08-08 DIAGNOSIS — N2581 Secondary hyperparathyroidism of renal origin: Secondary | ICD-10-CM | POA: Diagnosis not present

## 2018-08-08 DIAGNOSIS — D631 Anemia in chronic kidney disease: Secondary | ICD-10-CM | POA: Diagnosis not present

## 2018-08-08 DIAGNOSIS — N186 End stage renal disease: Secondary | ICD-10-CM | POA: Diagnosis not present

## 2018-08-08 DIAGNOSIS — Z992 Dependence on renal dialysis: Secondary | ICD-10-CM | POA: Diagnosis not present

## 2018-08-10 DIAGNOSIS — N186 End stage renal disease: Secondary | ICD-10-CM | POA: Diagnosis not present

## 2018-08-10 DIAGNOSIS — D631 Anemia in chronic kidney disease: Secondary | ICD-10-CM | POA: Diagnosis not present

## 2018-08-10 DIAGNOSIS — Z992 Dependence on renal dialysis: Secondary | ICD-10-CM | POA: Diagnosis not present

## 2018-08-10 DIAGNOSIS — N2581 Secondary hyperparathyroidism of renal origin: Secondary | ICD-10-CM | POA: Diagnosis not present

## 2018-08-12 DIAGNOSIS — N2581 Secondary hyperparathyroidism of renal origin: Secondary | ICD-10-CM | POA: Diagnosis not present

## 2018-08-12 DIAGNOSIS — N186 End stage renal disease: Secondary | ICD-10-CM | POA: Diagnosis not present

## 2018-08-12 DIAGNOSIS — Z992 Dependence on renal dialysis: Secondary | ICD-10-CM | POA: Diagnosis not present

## 2018-08-12 DIAGNOSIS — D631 Anemia in chronic kidney disease: Secondary | ICD-10-CM | POA: Diagnosis not present

## 2018-08-15 DIAGNOSIS — N186 End stage renal disease: Secondary | ICD-10-CM | POA: Diagnosis not present

## 2018-08-15 DIAGNOSIS — Z992 Dependence on renal dialysis: Secondary | ICD-10-CM | POA: Diagnosis not present

## 2018-08-15 DIAGNOSIS — N2581 Secondary hyperparathyroidism of renal origin: Secondary | ICD-10-CM | POA: Diagnosis not present

## 2018-08-15 DIAGNOSIS — D631 Anemia in chronic kidney disease: Secondary | ICD-10-CM | POA: Diagnosis not present

## 2018-08-17 DIAGNOSIS — N2581 Secondary hyperparathyroidism of renal origin: Secondary | ICD-10-CM | POA: Diagnosis not present

## 2018-08-17 DIAGNOSIS — D631 Anemia in chronic kidney disease: Secondary | ICD-10-CM | POA: Diagnosis not present

## 2018-08-17 DIAGNOSIS — Z992 Dependence on renal dialysis: Secondary | ICD-10-CM | POA: Diagnosis not present

## 2018-08-17 DIAGNOSIS — N186 End stage renal disease: Secondary | ICD-10-CM | POA: Diagnosis not present

## 2018-08-19 DIAGNOSIS — N186 End stage renal disease: Secondary | ICD-10-CM | POA: Diagnosis not present

## 2018-08-19 DIAGNOSIS — Z992 Dependence on renal dialysis: Secondary | ICD-10-CM | POA: Diagnosis not present

## 2018-08-19 DIAGNOSIS — N2581 Secondary hyperparathyroidism of renal origin: Secondary | ICD-10-CM | POA: Diagnosis not present

## 2018-08-19 DIAGNOSIS — D631 Anemia in chronic kidney disease: Secondary | ICD-10-CM | POA: Diagnosis not present

## 2018-08-22 DIAGNOSIS — N186 End stage renal disease: Secondary | ICD-10-CM | POA: Diagnosis not present

## 2018-08-22 DIAGNOSIS — D631 Anemia in chronic kidney disease: Secondary | ICD-10-CM | POA: Diagnosis not present

## 2018-08-22 DIAGNOSIS — Z992 Dependence on renal dialysis: Secondary | ICD-10-CM | POA: Diagnosis not present

## 2018-08-22 DIAGNOSIS — N2581 Secondary hyperparathyroidism of renal origin: Secondary | ICD-10-CM | POA: Diagnosis not present

## 2018-08-24 DIAGNOSIS — N2581 Secondary hyperparathyroidism of renal origin: Secondary | ICD-10-CM | POA: Diagnosis not present

## 2018-08-24 DIAGNOSIS — D631 Anemia in chronic kidney disease: Secondary | ICD-10-CM | POA: Diagnosis not present

## 2018-08-24 DIAGNOSIS — N186 End stage renal disease: Secondary | ICD-10-CM | POA: Diagnosis not present

## 2018-08-24 DIAGNOSIS — Z992 Dependence on renal dialysis: Secondary | ICD-10-CM | POA: Diagnosis not present

## 2018-08-25 DIAGNOSIS — N186 End stage renal disease: Secondary | ICD-10-CM | POA: Diagnosis not present

## 2018-08-25 DIAGNOSIS — I158 Other secondary hypertension: Secondary | ICD-10-CM | POA: Diagnosis not present

## 2018-08-25 DIAGNOSIS — Z992 Dependence on renal dialysis: Secondary | ICD-10-CM | POA: Diagnosis not present

## 2018-08-26 DIAGNOSIS — D631 Anemia in chronic kidney disease: Secondary | ICD-10-CM | POA: Diagnosis not present

## 2018-08-26 DIAGNOSIS — N2581 Secondary hyperparathyroidism of renal origin: Secondary | ICD-10-CM | POA: Diagnosis not present

## 2018-08-26 DIAGNOSIS — N186 End stage renal disease: Secondary | ICD-10-CM | POA: Diagnosis not present

## 2018-08-26 DIAGNOSIS — Z992 Dependence on renal dialysis: Secondary | ICD-10-CM | POA: Diagnosis not present

## 2018-08-29 DIAGNOSIS — N186 End stage renal disease: Secondary | ICD-10-CM | POA: Diagnosis not present

## 2018-08-29 DIAGNOSIS — N2581 Secondary hyperparathyroidism of renal origin: Secondary | ICD-10-CM | POA: Diagnosis not present

## 2018-08-29 DIAGNOSIS — Z992 Dependence on renal dialysis: Secondary | ICD-10-CM | POA: Diagnosis not present

## 2018-08-29 DIAGNOSIS — D631 Anemia in chronic kidney disease: Secondary | ICD-10-CM | POA: Diagnosis not present

## 2018-08-31 DIAGNOSIS — Z992 Dependence on renal dialysis: Secondary | ICD-10-CM | POA: Diagnosis not present

## 2018-08-31 DIAGNOSIS — N2581 Secondary hyperparathyroidism of renal origin: Secondary | ICD-10-CM | POA: Diagnosis not present

## 2018-08-31 DIAGNOSIS — N186 End stage renal disease: Secondary | ICD-10-CM | POA: Diagnosis not present

## 2018-08-31 DIAGNOSIS — D631 Anemia in chronic kidney disease: Secondary | ICD-10-CM | POA: Diagnosis not present

## 2018-09-02 DIAGNOSIS — D631 Anemia in chronic kidney disease: Secondary | ICD-10-CM | POA: Diagnosis not present

## 2018-09-02 DIAGNOSIS — N186 End stage renal disease: Secondary | ICD-10-CM | POA: Diagnosis not present

## 2018-09-02 DIAGNOSIS — Z992 Dependence on renal dialysis: Secondary | ICD-10-CM | POA: Diagnosis not present

## 2018-09-02 DIAGNOSIS — N2581 Secondary hyperparathyroidism of renal origin: Secondary | ICD-10-CM | POA: Diagnosis not present

## 2018-09-05 DIAGNOSIS — N2581 Secondary hyperparathyroidism of renal origin: Secondary | ICD-10-CM | POA: Diagnosis not present

## 2018-09-05 DIAGNOSIS — D631 Anemia in chronic kidney disease: Secondary | ICD-10-CM | POA: Diagnosis not present

## 2018-09-05 DIAGNOSIS — N186 End stage renal disease: Secondary | ICD-10-CM | POA: Diagnosis not present

## 2018-09-05 DIAGNOSIS — Z992 Dependence on renal dialysis: Secondary | ICD-10-CM | POA: Diagnosis not present

## 2018-09-07 DIAGNOSIS — N2581 Secondary hyperparathyroidism of renal origin: Secondary | ICD-10-CM | POA: Diagnosis not present

## 2018-09-07 DIAGNOSIS — D631 Anemia in chronic kidney disease: Secondary | ICD-10-CM | POA: Diagnosis not present

## 2018-09-07 DIAGNOSIS — N186 End stage renal disease: Secondary | ICD-10-CM | POA: Diagnosis not present

## 2018-09-07 DIAGNOSIS — Z992 Dependence on renal dialysis: Secondary | ICD-10-CM | POA: Diagnosis not present

## 2018-09-09 DIAGNOSIS — D631 Anemia in chronic kidney disease: Secondary | ICD-10-CM | POA: Diagnosis not present

## 2018-09-09 DIAGNOSIS — N2581 Secondary hyperparathyroidism of renal origin: Secondary | ICD-10-CM | POA: Diagnosis not present

## 2018-09-09 DIAGNOSIS — Z992 Dependence on renal dialysis: Secondary | ICD-10-CM | POA: Diagnosis not present

## 2018-09-09 DIAGNOSIS — N186 End stage renal disease: Secondary | ICD-10-CM | POA: Diagnosis not present

## 2018-09-12 DIAGNOSIS — N186 End stage renal disease: Secondary | ICD-10-CM | POA: Diagnosis not present

## 2018-09-12 DIAGNOSIS — N2581 Secondary hyperparathyroidism of renal origin: Secondary | ICD-10-CM | POA: Diagnosis not present

## 2018-09-12 DIAGNOSIS — Z992 Dependence on renal dialysis: Secondary | ICD-10-CM | POA: Diagnosis not present

## 2018-09-12 DIAGNOSIS — D631 Anemia in chronic kidney disease: Secondary | ICD-10-CM | POA: Diagnosis not present

## 2018-09-14 DIAGNOSIS — D631 Anemia in chronic kidney disease: Secondary | ICD-10-CM | POA: Diagnosis not present

## 2018-09-14 DIAGNOSIS — Z992 Dependence on renal dialysis: Secondary | ICD-10-CM | POA: Diagnosis not present

## 2018-09-14 DIAGNOSIS — N2581 Secondary hyperparathyroidism of renal origin: Secondary | ICD-10-CM | POA: Diagnosis not present

## 2018-09-14 DIAGNOSIS — N186 End stage renal disease: Secondary | ICD-10-CM | POA: Diagnosis not present

## 2018-09-16 DIAGNOSIS — D631 Anemia in chronic kidney disease: Secondary | ICD-10-CM | POA: Diagnosis not present

## 2018-09-16 DIAGNOSIS — N2581 Secondary hyperparathyroidism of renal origin: Secondary | ICD-10-CM | POA: Diagnosis not present

## 2018-09-16 DIAGNOSIS — N186 End stage renal disease: Secondary | ICD-10-CM | POA: Diagnosis not present

## 2018-09-16 DIAGNOSIS — Z992 Dependence on renal dialysis: Secondary | ICD-10-CM | POA: Diagnosis not present

## 2018-09-19 DIAGNOSIS — D631 Anemia in chronic kidney disease: Secondary | ICD-10-CM | POA: Diagnosis not present

## 2018-09-19 DIAGNOSIS — N186 End stage renal disease: Secondary | ICD-10-CM | POA: Diagnosis not present

## 2018-09-19 DIAGNOSIS — Z992 Dependence on renal dialysis: Secondary | ICD-10-CM | POA: Diagnosis not present

## 2018-09-19 DIAGNOSIS — N2581 Secondary hyperparathyroidism of renal origin: Secondary | ICD-10-CM | POA: Diagnosis not present

## 2018-09-21 DIAGNOSIS — D631 Anemia in chronic kidney disease: Secondary | ICD-10-CM | POA: Diagnosis not present

## 2018-09-21 DIAGNOSIS — N2581 Secondary hyperparathyroidism of renal origin: Secondary | ICD-10-CM | POA: Diagnosis not present

## 2018-09-21 DIAGNOSIS — N186 End stage renal disease: Secondary | ICD-10-CM | POA: Diagnosis not present

## 2018-09-21 DIAGNOSIS — Z992 Dependence on renal dialysis: Secondary | ICD-10-CM | POA: Diagnosis not present

## 2018-09-23 DIAGNOSIS — Z992 Dependence on renal dialysis: Secondary | ICD-10-CM | POA: Diagnosis not present

## 2018-09-23 DIAGNOSIS — N2581 Secondary hyperparathyroidism of renal origin: Secondary | ICD-10-CM | POA: Diagnosis not present

## 2018-09-23 DIAGNOSIS — D631 Anemia in chronic kidney disease: Secondary | ICD-10-CM | POA: Diagnosis not present

## 2018-09-23 DIAGNOSIS — N186 End stage renal disease: Secondary | ICD-10-CM | POA: Diagnosis not present

## 2018-09-25 DIAGNOSIS — I158 Other secondary hypertension: Secondary | ICD-10-CM | POA: Diagnosis not present

## 2018-09-25 DIAGNOSIS — N186 End stage renal disease: Secondary | ICD-10-CM | POA: Diagnosis not present

## 2018-09-25 DIAGNOSIS — Z992 Dependence on renal dialysis: Secondary | ICD-10-CM | POA: Diagnosis not present

## 2018-09-26 DIAGNOSIS — N186 End stage renal disease: Secondary | ICD-10-CM | POA: Diagnosis not present

## 2018-09-26 DIAGNOSIS — D631 Anemia in chronic kidney disease: Secondary | ICD-10-CM | POA: Diagnosis not present

## 2018-09-26 DIAGNOSIS — N2581 Secondary hyperparathyroidism of renal origin: Secondary | ICD-10-CM | POA: Diagnosis not present

## 2018-09-26 DIAGNOSIS — Z992 Dependence on renal dialysis: Secondary | ICD-10-CM | POA: Diagnosis not present

## 2018-09-28 DIAGNOSIS — D631 Anemia in chronic kidney disease: Secondary | ICD-10-CM | POA: Diagnosis not present

## 2018-09-28 DIAGNOSIS — N2581 Secondary hyperparathyroidism of renal origin: Secondary | ICD-10-CM | POA: Diagnosis not present

## 2018-09-28 DIAGNOSIS — Z992 Dependence on renal dialysis: Secondary | ICD-10-CM | POA: Diagnosis not present

## 2018-09-28 DIAGNOSIS — N186 End stage renal disease: Secondary | ICD-10-CM | POA: Diagnosis not present

## 2018-09-30 DIAGNOSIS — D631 Anemia in chronic kidney disease: Secondary | ICD-10-CM | POA: Diagnosis not present

## 2018-09-30 DIAGNOSIS — Z992 Dependence on renal dialysis: Secondary | ICD-10-CM | POA: Diagnosis not present

## 2018-09-30 DIAGNOSIS — N2581 Secondary hyperparathyroidism of renal origin: Secondary | ICD-10-CM | POA: Diagnosis not present

## 2018-09-30 DIAGNOSIS — N186 End stage renal disease: Secondary | ICD-10-CM | POA: Diagnosis not present

## 2018-10-03 DIAGNOSIS — N2581 Secondary hyperparathyroidism of renal origin: Secondary | ICD-10-CM | POA: Diagnosis not present

## 2018-10-03 DIAGNOSIS — N186 End stage renal disease: Secondary | ICD-10-CM | POA: Diagnosis not present

## 2018-10-03 DIAGNOSIS — D631 Anemia in chronic kidney disease: Secondary | ICD-10-CM | POA: Diagnosis not present

## 2018-10-03 DIAGNOSIS — Z992 Dependence on renal dialysis: Secondary | ICD-10-CM | POA: Diagnosis not present

## 2018-10-05 DIAGNOSIS — D631 Anemia in chronic kidney disease: Secondary | ICD-10-CM | POA: Diagnosis not present

## 2018-10-05 DIAGNOSIS — N2581 Secondary hyperparathyroidism of renal origin: Secondary | ICD-10-CM | POA: Diagnosis not present

## 2018-10-05 DIAGNOSIS — N186 End stage renal disease: Secondary | ICD-10-CM | POA: Diagnosis not present

## 2018-10-05 DIAGNOSIS — Z992 Dependence on renal dialysis: Secondary | ICD-10-CM | POA: Diagnosis not present

## 2018-10-07 DIAGNOSIS — D631 Anemia in chronic kidney disease: Secondary | ICD-10-CM | POA: Diagnosis not present

## 2018-10-07 DIAGNOSIS — Z992 Dependence on renal dialysis: Secondary | ICD-10-CM | POA: Diagnosis not present

## 2018-10-07 DIAGNOSIS — N186 End stage renal disease: Secondary | ICD-10-CM | POA: Diagnosis not present

## 2018-10-07 DIAGNOSIS — N2581 Secondary hyperparathyroidism of renal origin: Secondary | ICD-10-CM | POA: Diagnosis not present

## 2018-10-10 DIAGNOSIS — D631 Anemia in chronic kidney disease: Secondary | ICD-10-CM | POA: Diagnosis not present

## 2018-10-10 DIAGNOSIS — N2581 Secondary hyperparathyroidism of renal origin: Secondary | ICD-10-CM | POA: Diagnosis not present

## 2018-10-10 DIAGNOSIS — Z992 Dependence on renal dialysis: Secondary | ICD-10-CM | POA: Diagnosis not present

## 2018-10-10 DIAGNOSIS — N186 End stage renal disease: Secondary | ICD-10-CM | POA: Diagnosis not present

## 2018-10-12 DIAGNOSIS — D631 Anemia in chronic kidney disease: Secondary | ICD-10-CM | POA: Diagnosis not present

## 2018-10-12 DIAGNOSIS — Z992 Dependence on renal dialysis: Secondary | ICD-10-CM | POA: Diagnosis not present

## 2018-10-12 DIAGNOSIS — N2581 Secondary hyperparathyroidism of renal origin: Secondary | ICD-10-CM | POA: Diagnosis not present

## 2018-10-12 DIAGNOSIS — N186 End stage renal disease: Secondary | ICD-10-CM | POA: Diagnosis not present

## 2018-10-14 DIAGNOSIS — N2581 Secondary hyperparathyroidism of renal origin: Secondary | ICD-10-CM | POA: Diagnosis not present

## 2018-10-14 DIAGNOSIS — N186 End stage renal disease: Secondary | ICD-10-CM | POA: Diagnosis not present

## 2018-10-14 DIAGNOSIS — D631 Anemia in chronic kidney disease: Secondary | ICD-10-CM | POA: Diagnosis not present

## 2018-10-14 DIAGNOSIS — Z992 Dependence on renal dialysis: Secondary | ICD-10-CM | POA: Diagnosis not present

## 2018-10-17 ENCOUNTER — Other Ambulatory Visit: Payer: Self-pay | Admitting: Family Medicine

## 2018-10-17 DIAGNOSIS — N2581 Secondary hyperparathyroidism of renal origin: Secondary | ICD-10-CM | POA: Diagnosis not present

## 2018-10-17 DIAGNOSIS — D631 Anemia in chronic kidney disease: Secondary | ICD-10-CM | POA: Diagnosis not present

## 2018-10-17 DIAGNOSIS — N186 End stage renal disease: Secondary | ICD-10-CM | POA: Diagnosis not present

## 2018-10-17 DIAGNOSIS — Z992 Dependence on renal dialysis: Secondary | ICD-10-CM | POA: Diagnosis not present

## 2018-10-19 DIAGNOSIS — N2581 Secondary hyperparathyroidism of renal origin: Secondary | ICD-10-CM | POA: Diagnosis not present

## 2018-10-19 DIAGNOSIS — Z992 Dependence on renal dialysis: Secondary | ICD-10-CM | POA: Diagnosis not present

## 2018-10-19 DIAGNOSIS — N186 End stage renal disease: Secondary | ICD-10-CM | POA: Diagnosis not present

## 2018-10-19 DIAGNOSIS — D631 Anemia in chronic kidney disease: Secondary | ICD-10-CM | POA: Diagnosis not present

## 2018-10-21 DIAGNOSIS — N186 End stage renal disease: Secondary | ICD-10-CM | POA: Diagnosis not present

## 2018-10-21 DIAGNOSIS — N2581 Secondary hyperparathyroidism of renal origin: Secondary | ICD-10-CM | POA: Diagnosis not present

## 2018-10-21 DIAGNOSIS — Z992 Dependence on renal dialysis: Secondary | ICD-10-CM | POA: Diagnosis not present

## 2018-10-21 DIAGNOSIS — D631 Anemia in chronic kidney disease: Secondary | ICD-10-CM | POA: Diagnosis not present

## 2018-10-24 DIAGNOSIS — Z992 Dependence on renal dialysis: Secondary | ICD-10-CM | POA: Diagnosis not present

## 2018-10-24 DIAGNOSIS — N2581 Secondary hyperparathyroidism of renal origin: Secondary | ICD-10-CM | POA: Diagnosis not present

## 2018-10-24 DIAGNOSIS — D631 Anemia in chronic kidney disease: Secondary | ICD-10-CM | POA: Diagnosis not present

## 2018-10-24 DIAGNOSIS — N186 End stage renal disease: Secondary | ICD-10-CM | POA: Diagnosis not present

## 2018-10-25 DIAGNOSIS — F71 Moderate intellectual disabilities: Secondary | ICD-10-CM | POA: Diagnosis not present

## 2018-10-25 DIAGNOSIS — N186 End stage renal disease: Secondary | ICD-10-CM | POA: Diagnosis not present

## 2018-10-25 DIAGNOSIS — Z992 Dependence on renal dialysis: Secondary | ICD-10-CM | POA: Diagnosis not present

## 2018-10-25 DIAGNOSIS — I158 Other secondary hypertension: Secondary | ICD-10-CM | POA: Diagnosis not present

## 2018-10-25 DIAGNOSIS — F329 Major depressive disorder, single episode, unspecified: Secondary | ICD-10-CM | POA: Diagnosis not present

## 2018-10-25 DIAGNOSIS — F319 Bipolar disorder, unspecified: Secondary | ICD-10-CM | POA: Diagnosis not present

## 2018-10-26 DIAGNOSIS — N2581 Secondary hyperparathyroidism of renal origin: Secondary | ICD-10-CM | POA: Diagnosis not present

## 2018-10-26 DIAGNOSIS — N186 End stage renal disease: Secondary | ICD-10-CM | POA: Diagnosis not present

## 2018-10-26 DIAGNOSIS — Z992 Dependence on renal dialysis: Secondary | ICD-10-CM | POA: Diagnosis not present

## 2018-10-28 DIAGNOSIS — N2581 Secondary hyperparathyroidism of renal origin: Secondary | ICD-10-CM | POA: Diagnosis not present

## 2018-10-28 DIAGNOSIS — N186 End stage renal disease: Secondary | ICD-10-CM | POA: Diagnosis not present

## 2018-10-28 DIAGNOSIS — Z992 Dependence on renal dialysis: Secondary | ICD-10-CM | POA: Diagnosis not present

## 2018-10-31 DIAGNOSIS — N2581 Secondary hyperparathyroidism of renal origin: Secondary | ICD-10-CM | POA: Diagnosis not present

## 2018-10-31 DIAGNOSIS — N186 End stage renal disease: Secondary | ICD-10-CM | POA: Diagnosis not present

## 2018-10-31 DIAGNOSIS — Z992 Dependence on renal dialysis: Secondary | ICD-10-CM | POA: Diagnosis not present

## 2018-11-02 DIAGNOSIS — N186 End stage renal disease: Secondary | ICD-10-CM | POA: Diagnosis not present

## 2018-11-02 DIAGNOSIS — N2581 Secondary hyperparathyroidism of renal origin: Secondary | ICD-10-CM | POA: Diagnosis not present

## 2018-11-02 DIAGNOSIS — Z992 Dependence on renal dialysis: Secondary | ICD-10-CM | POA: Diagnosis not present

## 2018-11-04 DIAGNOSIS — N2581 Secondary hyperparathyroidism of renal origin: Secondary | ICD-10-CM | POA: Diagnosis not present

## 2018-11-04 DIAGNOSIS — N186 End stage renal disease: Secondary | ICD-10-CM | POA: Diagnosis not present

## 2018-11-04 DIAGNOSIS — Z992 Dependence on renal dialysis: Secondary | ICD-10-CM | POA: Diagnosis not present

## 2018-11-07 DIAGNOSIS — N186 End stage renal disease: Secondary | ICD-10-CM | POA: Diagnosis not present

## 2018-11-07 DIAGNOSIS — N2581 Secondary hyperparathyroidism of renal origin: Secondary | ICD-10-CM | POA: Diagnosis not present

## 2018-11-07 DIAGNOSIS — Z992 Dependence on renal dialysis: Secondary | ICD-10-CM | POA: Diagnosis not present

## 2018-11-09 DIAGNOSIS — Z992 Dependence on renal dialysis: Secondary | ICD-10-CM | POA: Diagnosis not present

## 2018-11-09 DIAGNOSIS — N2581 Secondary hyperparathyroidism of renal origin: Secondary | ICD-10-CM | POA: Diagnosis not present

## 2018-11-09 DIAGNOSIS — N186 End stage renal disease: Secondary | ICD-10-CM | POA: Diagnosis not present

## 2018-11-11 DIAGNOSIS — Z992 Dependence on renal dialysis: Secondary | ICD-10-CM | POA: Diagnosis not present

## 2018-11-11 DIAGNOSIS — N2581 Secondary hyperparathyroidism of renal origin: Secondary | ICD-10-CM | POA: Diagnosis not present

## 2018-11-11 DIAGNOSIS — N186 End stage renal disease: Secondary | ICD-10-CM | POA: Diagnosis not present

## 2018-11-14 DIAGNOSIS — Z992 Dependence on renal dialysis: Secondary | ICD-10-CM | POA: Diagnosis not present

## 2018-11-14 DIAGNOSIS — N186 End stage renal disease: Secondary | ICD-10-CM | POA: Diagnosis not present

## 2018-11-14 DIAGNOSIS — N2581 Secondary hyperparathyroidism of renal origin: Secondary | ICD-10-CM | POA: Diagnosis not present

## 2018-11-16 DIAGNOSIS — N186 End stage renal disease: Secondary | ICD-10-CM | POA: Diagnosis not present

## 2018-11-16 DIAGNOSIS — N2581 Secondary hyperparathyroidism of renal origin: Secondary | ICD-10-CM | POA: Diagnosis not present

## 2018-11-16 DIAGNOSIS — Z992 Dependence on renal dialysis: Secondary | ICD-10-CM | POA: Diagnosis not present

## 2018-11-18 DIAGNOSIS — N2581 Secondary hyperparathyroidism of renal origin: Secondary | ICD-10-CM | POA: Diagnosis not present

## 2018-11-18 DIAGNOSIS — N186 End stage renal disease: Secondary | ICD-10-CM | POA: Diagnosis not present

## 2018-11-18 DIAGNOSIS — Z992 Dependence on renal dialysis: Secondary | ICD-10-CM | POA: Diagnosis not present

## 2018-11-21 DIAGNOSIS — N186 End stage renal disease: Secondary | ICD-10-CM | POA: Diagnosis not present

## 2018-11-21 DIAGNOSIS — Z992 Dependence on renal dialysis: Secondary | ICD-10-CM | POA: Diagnosis not present

## 2018-11-21 DIAGNOSIS — N2581 Secondary hyperparathyroidism of renal origin: Secondary | ICD-10-CM | POA: Diagnosis not present

## 2018-11-23 DIAGNOSIS — N186 End stage renal disease: Secondary | ICD-10-CM | POA: Diagnosis not present

## 2018-11-23 DIAGNOSIS — N2581 Secondary hyperparathyroidism of renal origin: Secondary | ICD-10-CM | POA: Diagnosis not present

## 2018-11-23 DIAGNOSIS — Z992 Dependence on renal dialysis: Secondary | ICD-10-CM | POA: Diagnosis not present

## 2018-11-25 DIAGNOSIS — N186 End stage renal disease: Secondary | ICD-10-CM | POA: Diagnosis not present

## 2018-11-25 DIAGNOSIS — D509 Iron deficiency anemia, unspecified: Secondary | ICD-10-CM | POA: Diagnosis not present

## 2018-11-25 DIAGNOSIS — N2581 Secondary hyperparathyroidism of renal origin: Secondary | ICD-10-CM | POA: Diagnosis not present

## 2018-11-25 DIAGNOSIS — Z992 Dependence on renal dialysis: Secondary | ICD-10-CM | POA: Diagnosis not present

## 2018-11-25 DIAGNOSIS — D631 Anemia in chronic kidney disease: Secondary | ICD-10-CM | POA: Diagnosis not present

## 2018-11-25 DIAGNOSIS — I158 Other secondary hypertension: Secondary | ICD-10-CM | POA: Diagnosis not present

## 2018-11-28 DIAGNOSIS — N2581 Secondary hyperparathyroidism of renal origin: Secondary | ICD-10-CM | POA: Diagnosis not present

## 2018-11-28 DIAGNOSIS — Z992 Dependence on renal dialysis: Secondary | ICD-10-CM | POA: Diagnosis not present

## 2018-11-28 DIAGNOSIS — D631 Anemia in chronic kidney disease: Secondary | ICD-10-CM | POA: Diagnosis not present

## 2018-11-28 DIAGNOSIS — D509 Iron deficiency anemia, unspecified: Secondary | ICD-10-CM | POA: Diagnosis not present

## 2018-11-28 DIAGNOSIS — N186 End stage renal disease: Secondary | ICD-10-CM | POA: Diagnosis not present

## 2018-11-30 DIAGNOSIS — N186 End stage renal disease: Secondary | ICD-10-CM | POA: Diagnosis not present

## 2018-11-30 DIAGNOSIS — N2581 Secondary hyperparathyroidism of renal origin: Secondary | ICD-10-CM | POA: Diagnosis not present

## 2018-11-30 DIAGNOSIS — D509 Iron deficiency anemia, unspecified: Secondary | ICD-10-CM | POA: Diagnosis not present

## 2018-11-30 DIAGNOSIS — D631 Anemia in chronic kidney disease: Secondary | ICD-10-CM | POA: Diagnosis not present

## 2018-11-30 DIAGNOSIS — Z992 Dependence on renal dialysis: Secondary | ICD-10-CM | POA: Diagnosis not present

## 2018-12-02 DIAGNOSIS — D509 Iron deficiency anemia, unspecified: Secondary | ICD-10-CM | POA: Diagnosis not present

## 2018-12-02 DIAGNOSIS — D631 Anemia in chronic kidney disease: Secondary | ICD-10-CM | POA: Diagnosis not present

## 2018-12-02 DIAGNOSIS — N186 End stage renal disease: Secondary | ICD-10-CM | POA: Diagnosis not present

## 2018-12-02 DIAGNOSIS — Z992 Dependence on renal dialysis: Secondary | ICD-10-CM | POA: Diagnosis not present

## 2018-12-02 DIAGNOSIS — N2581 Secondary hyperparathyroidism of renal origin: Secondary | ICD-10-CM | POA: Diagnosis not present

## 2018-12-05 DIAGNOSIS — Z992 Dependence on renal dialysis: Secondary | ICD-10-CM | POA: Diagnosis not present

## 2018-12-05 DIAGNOSIS — D631 Anemia in chronic kidney disease: Secondary | ICD-10-CM | POA: Diagnosis not present

## 2018-12-05 DIAGNOSIS — N2581 Secondary hyperparathyroidism of renal origin: Secondary | ICD-10-CM | POA: Diagnosis not present

## 2018-12-05 DIAGNOSIS — D509 Iron deficiency anemia, unspecified: Secondary | ICD-10-CM | POA: Diagnosis not present

## 2018-12-05 DIAGNOSIS — N186 End stage renal disease: Secondary | ICD-10-CM | POA: Diagnosis not present

## 2018-12-07 DIAGNOSIS — D509 Iron deficiency anemia, unspecified: Secondary | ICD-10-CM | POA: Diagnosis not present

## 2018-12-07 DIAGNOSIS — Z992 Dependence on renal dialysis: Secondary | ICD-10-CM | POA: Diagnosis not present

## 2018-12-07 DIAGNOSIS — N2581 Secondary hyperparathyroidism of renal origin: Secondary | ICD-10-CM | POA: Diagnosis not present

## 2018-12-07 DIAGNOSIS — D631 Anemia in chronic kidney disease: Secondary | ICD-10-CM | POA: Diagnosis not present

## 2018-12-07 DIAGNOSIS — N186 End stage renal disease: Secondary | ICD-10-CM | POA: Diagnosis not present

## 2018-12-09 DIAGNOSIS — N186 End stage renal disease: Secondary | ICD-10-CM | POA: Diagnosis not present

## 2018-12-09 DIAGNOSIS — N2581 Secondary hyperparathyroidism of renal origin: Secondary | ICD-10-CM | POA: Diagnosis not present

## 2018-12-09 DIAGNOSIS — Z992 Dependence on renal dialysis: Secondary | ICD-10-CM | POA: Diagnosis not present

## 2018-12-09 DIAGNOSIS — D631 Anemia in chronic kidney disease: Secondary | ICD-10-CM | POA: Diagnosis not present

## 2018-12-09 DIAGNOSIS — D509 Iron deficiency anemia, unspecified: Secondary | ICD-10-CM | POA: Diagnosis not present

## 2018-12-12 DIAGNOSIS — D509 Iron deficiency anemia, unspecified: Secondary | ICD-10-CM | POA: Diagnosis not present

## 2018-12-12 DIAGNOSIS — N186 End stage renal disease: Secondary | ICD-10-CM | POA: Diagnosis not present

## 2018-12-12 DIAGNOSIS — D631 Anemia in chronic kidney disease: Secondary | ICD-10-CM | POA: Diagnosis not present

## 2018-12-12 DIAGNOSIS — Z992 Dependence on renal dialysis: Secondary | ICD-10-CM | POA: Diagnosis not present

## 2018-12-12 DIAGNOSIS — N2581 Secondary hyperparathyroidism of renal origin: Secondary | ICD-10-CM | POA: Diagnosis not present

## 2018-12-14 DIAGNOSIS — Z992 Dependence on renal dialysis: Secondary | ICD-10-CM | POA: Diagnosis not present

## 2018-12-14 DIAGNOSIS — D509 Iron deficiency anemia, unspecified: Secondary | ICD-10-CM | POA: Diagnosis not present

## 2018-12-14 DIAGNOSIS — D631 Anemia in chronic kidney disease: Secondary | ICD-10-CM | POA: Diagnosis not present

## 2018-12-14 DIAGNOSIS — N186 End stage renal disease: Secondary | ICD-10-CM | POA: Diagnosis not present

## 2018-12-14 DIAGNOSIS — N2581 Secondary hyperparathyroidism of renal origin: Secondary | ICD-10-CM | POA: Diagnosis not present

## 2018-12-16 DIAGNOSIS — D631 Anemia in chronic kidney disease: Secondary | ICD-10-CM | POA: Diagnosis not present

## 2018-12-16 DIAGNOSIS — N2581 Secondary hyperparathyroidism of renal origin: Secondary | ICD-10-CM | POA: Diagnosis not present

## 2018-12-16 DIAGNOSIS — N186 End stage renal disease: Secondary | ICD-10-CM | POA: Diagnosis not present

## 2018-12-16 DIAGNOSIS — D509 Iron deficiency anemia, unspecified: Secondary | ICD-10-CM | POA: Diagnosis not present

## 2018-12-16 DIAGNOSIS — Z992 Dependence on renal dialysis: Secondary | ICD-10-CM | POA: Diagnosis not present

## 2018-12-19 DIAGNOSIS — N2581 Secondary hyperparathyroidism of renal origin: Secondary | ICD-10-CM | POA: Diagnosis not present

## 2018-12-19 DIAGNOSIS — D631 Anemia in chronic kidney disease: Secondary | ICD-10-CM | POA: Diagnosis not present

## 2018-12-19 DIAGNOSIS — N186 End stage renal disease: Secondary | ICD-10-CM | POA: Diagnosis not present

## 2018-12-19 DIAGNOSIS — D509 Iron deficiency anemia, unspecified: Secondary | ICD-10-CM | POA: Diagnosis not present

## 2018-12-19 DIAGNOSIS — Z992 Dependence on renal dialysis: Secondary | ICD-10-CM | POA: Diagnosis not present

## 2018-12-21 DIAGNOSIS — Z992 Dependence on renal dialysis: Secondary | ICD-10-CM | POA: Diagnosis not present

## 2018-12-21 DIAGNOSIS — N2581 Secondary hyperparathyroidism of renal origin: Secondary | ICD-10-CM | POA: Diagnosis not present

## 2018-12-21 DIAGNOSIS — D509 Iron deficiency anemia, unspecified: Secondary | ICD-10-CM | POA: Diagnosis not present

## 2018-12-21 DIAGNOSIS — D631 Anemia in chronic kidney disease: Secondary | ICD-10-CM | POA: Diagnosis not present

## 2018-12-21 DIAGNOSIS — N186 End stage renal disease: Secondary | ICD-10-CM | POA: Diagnosis not present

## 2018-12-23 DIAGNOSIS — D509 Iron deficiency anemia, unspecified: Secondary | ICD-10-CM | POA: Diagnosis not present

## 2018-12-23 DIAGNOSIS — D631 Anemia in chronic kidney disease: Secondary | ICD-10-CM | POA: Diagnosis not present

## 2018-12-23 DIAGNOSIS — N2581 Secondary hyperparathyroidism of renal origin: Secondary | ICD-10-CM | POA: Diagnosis not present

## 2018-12-23 DIAGNOSIS — N186 End stage renal disease: Secondary | ICD-10-CM | POA: Diagnosis not present

## 2018-12-23 DIAGNOSIS — Z992 Dependence on renal dialysis: Secondary | ICD-10-CM | POA: Diagnosis not present

## 2018-12-26 DIAGNOSIS — D631 Anemia in chronic kidney disease: Secondary | ICD-10-CM | POA: Diagnosis not present

## 2018-12-26 DIAGNOSIS — N186 End stage renal disease: Secondary | ICD-10-CM | POA: Diagnosis not present

## 2018-12-26 DIAGNOSIS — Z992 Dependence on renal dialysis: Secondary | ICD-10-CM | POA: Diagnosis not present

## 2018-12-26 DIAGNOSIS — N2581 Secondary hyperparathyroidism of renal origin: Secondary | ICD-10-CM | POA: Diagnosis not present

## 2018-12-26 DIAGNOSIS — Z23 Encounter for immunization: Secondary | ICD-10-CM | POA: Diagnosis not present

## 2018-12-26 DIAGNOSIS — I158 Other secondary hypertension: Secondary | ICD-10-CM | POA: Diagnosis not present

## 2018-12-28 DIAGNOSIS — Z23 Encounter for immunization: Secondary | ICD-10-CM | POA: Diagnosis not present

## 2018-12-28 DIAGNOSIS — N186 End stage renal disease: Secondary | ICD-10-CM | POA: Diagnosis not present

## 2018-12-28 DIAGNOSIS — Z992 Dependence on renal dialysis: Secondary | ICD-10-CM | POA: Diagnosis not present

## 2018-12-28 DIAGNOSIS — N2581 Secondary hyperparathyroidism of renal origin: Secondary | ICD-10-CM | POA: Diagnosis not present

## 2018-12-28 DIAGNOSIS — D631 Anemia in chronic kidney disease: Secondary | ICD-10-CM | POA: Diagnosis not present

## 2018-12-30 DIAGNOSIS — Z23 Encounter for immunization: Secondary | ICD-10-CM | POA: Diagnosis not present

## 2018-12-30 DIAGNOSIS — Z992 Dependence on renal dialysis: Secondary | ICD-10-CM | POA: Diagnosis not present

## 2018-12-30 DIAGNOSIS — D631 Anemia in chronic kidney disease: Secondary | ICD-10-CM | POA: Diagnosis not present

## 2018-12-30 DIAGNOSIS — N186 End stage renal disease: Secondary | ICD-10-CM | POA: Diagnosis not present

## 2018-12-30 DIAGNOSIS — N2581 Secondary hyperparathyroidism of renal origin: Secondary | ICD-10-CM | POA: Diagnosis not present

## 2019-01-02 DIAGNOSIS — Z23 Encounter for immunization: Secondary | ICD-10-CM | POA: Diagnosis not present

## 2019-01-02 DIAGNOSIS — N2581 Secondary hyperparathyroidism of renal origin: Secondary | ICD-10-CM | POA: Diagnosis not present

## 2019-01-02 DIAGNOSIS — Z992 Dependence on renal dialysis: Secondary | ICD-10-CM | POA: Diagnosis not present

## 2019-01-02 DIAGNOSIS — N186 End stage renal disease: Secondary | ICD-10-CM | POA: Diagnosis not present

## 2019-01-02 DIAGNOSIS — D631 Anemia in chronic kidney disease: Secondary | ICD-10-CM | POA: Diagnosis not present

## 2019-01-04 DIAGNOSIS — N2581 Secondary hyperparathyroidism of renal origin: Secondary | ICD-10-CM | POA: Diagnosis not present

## 2019-01-04 DIAGNOSIS — N186 End stage renal disease: Secondary | ICD-10-CM | POA: Diagnosis not present

## 2019-01-04 DIAGNOSIS — Z992 Dependence on renal dialysis: Secondary | ICD-10-CM | POA: Diagnosis not present

## 2019-01-04 DIAGNOSIS — D631 Anemia in chronic kidney disease: Secondary | ICD-10-CM | POA: Diagnosis not present

## 2019-01-04 DIAGNOSIS — Z23 Encounter for immunization: Secondary | ICD-10-CM | POA: Diagnosis not present

## 2019-01-06 DIAGNOSIS — D631 Anemia in chronic kidney disease: Secondary | ICD-10-CM | POA: Diagnosis not present

## 2019-01-06 DIAGNOSIS — Z992 Dependence on renal dialysis: Secondary | ICD-10-CM | POA: Diagnosis not present

## 2019-01-06 DIAGNOSIS — N2581 Secondary hyperparathyroidism of renal origin: Secondary | ICD-10-CM | POA: Diagnosis not present

## 2019-01-06 DIAGNOSIS — N186 End stage renal disease: Secondary | ICD-10-CM | POA: Diagnosis not present

## 2019-01-06 DIAGNOSIS — Z23 Encounter for immunization: Secondary | ICD-10-CM | POA: Diagnosis not present

## 2019-01-09 DIAGNOSIS — Z992 Dependence on renal dialysis: Secondary | ICD-10-CM | POA: Diagnosis not present

## 2019-01-09 DIAGNOSIS — N2581 Secondary hyperparathyroidism of renal origin: Secondary | ICD-10-CM | POA: Diagnosis not present

## 2019-01-09 DIAGNOSIS — N186 End stage renal disease: Secondary | ICD-10-CM | POA: Diagnosis not present

## 2019-01-09 DIAGNOSIS — D631 Anemia in chronic kidney disease: Secondary | ICD-10-CM | POA: Diagnosis not present

## 2019-01-09 DIAGNOSIS — Z23 Encounter for immunization: Secondary | ICD-10-CM | POA: Diagnosis not present

## 2019-01-11 DIAGNOSIS — N186 End stage renal disease: Secondary | ICD-10-CM | POA: Diagnosis not present

## 2019-01-11 DIAGNOSIS — Z23 Encounter for immunization: Secondary | ICD-10-CM | POA: Diagnosis not present

## 2019-01-11 DIAGNOSIS — Z992 Dependence on renal dialysis: Secondary | ICD-10-CM | POA: Diagnosis not present

## 2019-01-11 DIAGNOSIS — N2581 Secondary hyperparathyroidism of renal origin: Secondary | ICD-10-CM | POA: Diagnosis not present

## 2019-01-11 DIAGNOSIS — D631 Anemia in chronic kidney disease: Secondary | ICD-10-CM | POA: Diagnosis not present

## 2019-01-13 DIAGNOSIS — D631 Anemia in chronic kidney disease: Secondary | ICD-10-CM | POA: Diagnosis not present

## 2019-01-13 DIAGNOSIS — Z23 Encounter for immunization: Secondary | ICD-10-CM | POA: Diagnosis not present

## 2019-01-13 DIAGNOSIS — N2581 Secondary hyperparathyroidism of renal origin: Secondary | ICD-10-CM | POA: Diagnosis not present

## 2019-01-13 DIAGNOSIS — N186 End stage renal disease: Secondary | ICD-10-CM | POA: Diagnosis not present

## 2019-01-13 DIAGNOSIS — Z992 Dependence on renal dialysis: Secondary | ICD-10-CM | POA: Diagnosis not present

## 2019-01-16 DIAGNOSIS — N186 End stage renal disease: Secondary | ICD-10-CM | POA: Diagnosis not present

## 2019-01-16 DIAGNOSIS — D631 Anemia in chronic kidney disease: Secondary | ICD-10-CM | POA: Diagnosis not present

## 2019-01-16 DIAGNOSIS — Z992 Dependence on renal dialysis: Secondary | ICD-10-CM | POA: Diagnosis not present

## 2019-01-16 DIAGNOSIS — Z23 Encounter for immunization: Secondary | ICD-10-CM | POA: Diagnosis not present

## 2019-01-16 DIAGNOSIS — N2581 Secondary hyperparathyroidism of renal origin: Secondary | ICD-10-CM | POA: Diagnosis not present

## 2019-01-18 DIAGNOSIS — N186 End stage renal disease: Secondary | ICD-10-CM | POA: Diagnosis not present

## 2019-01-18 DIAGNOSIS — Z23 Encounter for immunization: Secondary | ICD-10-CM | POA: Diagnosis not present

## 2019-01-18 DIAGNOSIS — N2581 Secondary hyperparathyroidism of renal origin: Secondary | ICD-10-CM | POA: Diagnosis not present

## 2019-01-18 DIAGNOSIS — Z992 Dependence on renal dialysis: Secondary | ICD-10-CM | POA: Diagnosis not present

## 2019-01-18 DIAGNOSIS — D631 Anemia in chronic kidney disease: Secondary | ICD-10-CM | POA: Diagnosis not present

## 2019-01-20 DIAGNOSIS — N186 End stage renal disease: Secondary | ICD-10-CM | POA: Diagnosis not present

## 2019-01-20 DIAGNOSIS — Z23 Encounter for immunization: Secondary | ICD-10-CM | POA: Diagnosis not present

## 2019-01-20 DIAGNOSIS — N2581 Secondary hyperparathyroidism of renal origin: Secondary | ICD-10-CM | POA: Diagnosis not present

## 2019-01-20 DIAGNOSIS — Z992 Dependence on renal dialysis: Secondary | ICD-10-CM | POA: Diagnosis not present

## 2019-01-20 DIAGNOSIS — D631 Anemia in chronic kidney disease: Secondary | ICD-10-CM | POA: Diagnosis not present

## 2019-01-22 ENCOUNTER — Other Ambulatory Visit: Payer: Self-pay

## 2019-01-22 DIAGNOSIS — N186 End stage renal disease: Secondary | ICD-10-CM | POA: Diagnosis not present

## 2019-01-22 DIAGNOSIS — Z992 Dependence on renal dialysis: Secondary | ICD-10-CM | POA: Diagnosis not present

## 2019-01-22 DIAGNOSIS — I871 Compression of vein: Secondary | ICD-10-CM | POA: Diagnosis not present

## 2019-01-22 DIAGNOSIS — T82858A Stenosis of vascular prosthetic devices, implants and grafts, initial encounter: Secondary | ICD-10-CM | POA: Diagnosis not present

## 2019-01-22 MED ORDER — LEVOTHYROXINE SODIUM 112 MCG PO TABS: ORAL_TABLET | ORAL | 0 refills | Status: DC

## 2019-01-22 NOTE — Telephone Encounter (Signed)
Needs appointment for TSH

## 2019-01-23 DIAGNOSIS — Z23 Encounter for immunization: Secondary | ICD-10-CM | POA: Diagnosis not present

## 2019-01-23 DIAGNOSIS — N2581 Secondary hyperparathyroidism of renal origin: Secondary | ICD-10-CM | POA: Diagnosis not present

## 2019-01-23 DIAGNOSIS — D631 Anemia in chronic kidney disease: Secondary | ICD-10-CM | POA: Diagnosis not present

## 2019-01-23 DIAGNOSIS — Z992 Dependence on renal dialysis: Secondary | ICD-10-CM | POA: Diagnosis not present

## 2019-01-23 DIAGNOSIS — N186 End stage renal disease: Secondary | ICD-10-CM | POA: Diagnosis not present

## 2019-01-24 ENCOUNTER — Telehealth: Payer: Self-pay | Admitting: *Deleted

## 2019-01-24 NOTE — Telephone Encounter (Signed)
LVM to call office back to see about scheduling an appointment per Dr. Sandi Carne. Rigdon Macomber Zimmerman Rumple, CMA

## 2019-01-25 DIAGNOSIS — Z992 Dependence on renal dialysis: Secondary | ICD-10-CM | POA: Diagnosis not present

## 2019-01-25 DIAGNOSIS — D631 Anemia in chronic kidney disease: Secondary | ICD-10-CM | POA: Diagnosis not present

## 2019-01-25 DIAGNOSIS — N186 End stage renal disease: Secondary | ICD-10-CM | POA: Diagnosis not present

## 2019-01-25 DIAGNOSIS — N2581 Secondary hyperparathyroidism of renal origin: Secondary | ICD-10-CM | POA: Diagnosis not present

## 2019-01-25 DIAGNOSIS — I158 Other secondary hypertension: Secondary | ICD-10-CM | POA: Diagnosis not present

## 2019-01-25 NOTE — Telephone Encounter (Signed)
Contacted pt cell phone and woman answered and informed her to have pt call office to schedule an appointment. Vanessa Alvarez, CMA

## 2019-01-25 NOTE — Telephone Encounter (Signed)
Opened in error, see refill documentation.Vanessa Alvarez, CMA

## 2019-01-26 DIAGNOSIS — F71 Moderate intellectual disabilities: Secondary | ICD-10-CM | POA: Diagnosis not present

## 2019-01-26 DIAGNOSIS — F319 Bipolar disorder, unspecified: Secondary | ICD-10-CM | POA: Diagnosis not present

## 2019-01-26 DIAGNOSIS — F329 Major depressive disorder, single episode, unspecified: Secondary | ICD-10-CM | POA: Diagnosis not present

## 2019-01-27 DIAGNOSIS — Z992 Dependence on renal dialysis: Secondary | ICD-10-CM | POA: Diagnosis not present

## 2019-01-27 DIAGNOSIS — N2581 Secondary hyperparathyroidism of renal origin: Secondary | ICD-10-CM | POA: Diagnosis not present

## 2019-01-27 DIAGNOSIS — N186 End stage renal disease: Secondary | ICD-10-CM | POA: Diagnosis not present

## 2019-01-27 DIAGNOSIS — D631 Anemia in chronic kidney disease: Secondary | ICD-10-CM | POA: Diagnosis not present

## 2019-01-30 DIAGNOSIS — N186 End stage renal disease: Secondary | ICD-10-CM | POA: Diagnosis not present

## 2019-01-30 DIAGNOSIS — D631 Anemia in chronic kidney disease: Secondary | ICD-10-CM | POA: Diagnosis not present

## 2019-01-30 DIAGNOSIS — Z992 Dependence on renal dialysis: Secondary | ICD-10-CM | POA: Diagnosis not present

## 2019-01-30 DIAGNOSIS — N2581 Secondary hyperparathyroidism of renal origin: Secondary | ICD-10-CM | POA: Diagnosis not present

## 2019-02-01 DIAGNOSIS — D631 Anemia in chronic kidney disease: Secondary | ICD-10-CM | POA: Diagnosis not present

## 2019-02-01 DIAGNOSIS — N186 End stage renal disease: Secondary | ICD-10-CM | POA: Diagnosis not present

## 2019-02-01 DIAGNOSIS — Z992 Dependence on renal dialysis: Secondary | ICD-10-CM | POA: Diagnosis not present

## 2019-02-01 DIAGNOSIS — N2581 Secondary hyperparathyroidism of renal origin: Secondary | ICD-10-CM | POA: Diagnosis not present

## 2019-02-03 DIAGNOSIS — Z992 Dependence on renal dialysis: Secondary | ICD-10-CM | POA: Diagnosis not present

## 2019-02-03 DIAGNOSIS — N2581 Secondary hyperparathyroidism of renal origin: Secondary | ICD-10-CM | POA: Diagnosis not present

## 2019-02-03 DIAGNOSIS — N186 End stage renal disease: Secondary | ICD-10-CM | POA: Diagnosis not present

## 2019-02-03 DIAGNOSIS — D631 Anemia in chronic kidney disease: Secondary | ICD-10-CM | POA: Diagnosis not present

## 2019-02-06 DIAGNOSIS — N2581 Secondary hyperparathyroidism of renal origin: Secondary | ICD-10-CM | POA: Diagnosis not present

## 2019-02-06 DIAGNOSIS — N186 End stage renal disease: Secondary | ICD-10-CM | POA: Diagnosis not present

## 2019-02-06 DIAGNOSIS — D631 Anemia in chronic kidney disease: Secondary | ICD-10-CM | POA: Diagnosis not present

## 2019-02-06 DIAGNOSIS — Z992 Dependence on renal dialysis: Secondary | ICD-10-CM | POA: Diagnosis not present

## 2019-02-08 DIAGNOSIS — N186 End stage renal disease: Secondary | ICD-10-CM | POA: Diagnosis not present

## 2019-02-08 DIAGNOSIS — D631 Anemia in chronic kidney disease: Secondary | ICD-10-CM | POA: Diagnosis not present

## 2019-02-08 DIAGNOSIS — N2581 Secondary hyperparathyroidism of renal origin: Secondary | ICD-10-CM | POA: Diagnosis not present

## 2019-02-08 DIAGNOSIS — Z992 Dependence on renal dialysis: Secondary | ICD-10-CM | POA: Diagnosis not present

## 2019-02-10 DIAGNOSIS — N2581 Secondary hyperparathyroidism of renal origin: Secondary | ICD-10-CM | POA: Diagnosis not present

## 2019-02-10 DIAGNOSIS — Z992 Dependence on renal dialysis: Secondary | ICD-10-CM | POA: Diagnosis not present

## 2019-02-10 DIAGNOSIS — N186 End stage renal disease: Secondary | ICD-10-CM | POA: Diagnosis not present

## 2019-02-10 DIAGNOSIS — D631 Anemia in chronic kidney disease: Secondary | ICD-10-CM | POA: Diagnosis not present

## 2019-02-13 DIAGNOSIS — Z992 Dependence on renal dialysis: Secondary | ICD-10-CM | POA: Diagnosis not present

## 2019-02-13 DIAGNOSIS — D631 Anemia in chronic kidney disease: Secondary | ICD-10-CM | POA: Diagnosis not present

## 2019-02-13 DIAGNOSIS — N2581 Secondary hyperparathyroidism of renal origin: Secondary | ICD-10-CM | POA: Diagnosis not present

## 2019-02-13 DIAGNOSIS — N186 End stage renal disease: Secondary | ICD-10-CM | POA: Diagnosis not present

## 2019-02-15 DIAGNOSIS — N186 End stage renal disease: Secondary | ICD-10-CM | POA: Diagnosis not present

## 2019-02-15 DIAGNOSIS — D631 Anemia in chronic kidney disease: Secondary | ICD-10-CM | POA: Diagnosis not present

## 2019-02-15 DIAGNOSIS — N2581 Secondary hyperparathyroidism of renal origin: Secondary | ICD-10-CM | POA: Diagnosis not present

## 2019-02-15 DIAGNOSIS — Z992 Dependence on renal dialysis: Secondary | ICD-10-CM | POA: Diagnosis not present

## 2019-02-17 DIAGNOSIS — Z992 Dependence on renal dialysis: Secondary | ICD-10-CM | POA: Diagnosis not present

## 2019-02-17 DIAGNOSIS — N186 End stage renal disease: Secondary | ICD-10-CM | POA: Diagnosis not present

## 2019-02-17 DIAGNOSIS — N2581 Secondary hyperparathyroidism of renal origin: Secondary | ICD-10-CM | POA: Diagnosis not present

## 2019-02-17 DIAGNOSIS — D631 Anemia in chronic kidney disease: Secondary | ICD-10-CM | POA: Diagnosis not present

## 2019-02-20 DIAGNOSIS — Z992 Dependence on renal dialysis: Secondary | ICD-10-CM | POA: Diagnosis not present

## 2019-02-20 DIAGNOSIS — D631 Anemia in chronic kidney disease: Secondary | ICD-10-CM | POA: Diagnosis not present

## 2019-02-20 DIAGNOSIS — N2581 Secondary hyperparathyroidism of renal origin: Secondary | ICD-10-CM | POA: Diagnosis not present

## 2019-02-20 DIAGNOSIS — N186 End stage renal disease: Secondary | ICD-10-CM | POA: Diagnosis not present

## 2019-02-22 DIAGNOSIS — N186 End stage renal disease: Secondary | ICD-10-CM | POA: Diagnosis not present

## 2019-02-22 DIAGNOSIS — N2581 Secondary hyperparathyroidism of renal origin: Secondary | ICD-10-CM | POA: Diagnosis not present

## 2019-02-22 DIAGNOSIS — Z992 Dependence on renal dialysis: Secondary | ICD-10-CM | POA: Diagnosis not present

## 2019-02-22 DIAGNOSIS — D631 Anemia in chronic kidney disease: Secondary | ICD-10-CM | POA: Diagnosis not present

## 2019-02-24 DIAGNOSIS — D631 Anemia in chronic kidney disease: Secondary | ICD-10-CM | POA: Diagnosis not present

## 2019-02-24 DIAGNOSIS — N186 End stage renal disease: Secondary | ICD-10-CM | POA: Diagnosis not present

## 2019-02-24 DIAGNOSIS — N2581 Secondary hyperparathyroidism of renal origin: Secondary | ICD-10-CM | POA: Diagnosis not present

## 2019-02-24 DIAGNOSIS — Z992 Dependence on renal dialysis: Secondary | ICD-10-CM | POA: Diagnosis not present

## 2019-02-25 DIAGNOSIS — I158 Other secondary hypertension: Secondary | ICD-10-CM | POA: Diagnosis not present

## 2019-02-25 DIAGNOSIS — Z992 Dependence on renal dialysis: Secondary | ICD-10-CM | POA: Diagnosis not present

## 2019-02-25 DIAGNOSIS — N186 End stage renal disease: Secondary | ICD-10-CM | POA: Diagnosis not present

## 2019-02-26 DIAGNOSIS — F329 Major depressive disorder, single episode, unspecified: Secondary | ICD-10-CM | POA: Diagnosis not present

## 2019-02-27 DIAGNOSIS — N186 End stage renal disease: Secondary | ICD-10-CM | POA: Diagnosis not present

## 2019-02-27 DIAGNOSIS — N2581 Secondary hyperparathyroidism of renal origin: Secondary | ICD-10-CM | POA: Diagnosis not present

## 2019-02-27 DIAGNOSIS — Z992 Dependence on renal dialysis: Secondary | ICD-10-CM | POA: Diagnosis not present

## 2019-03-01 DIAGNOSIS — N2581 Secondary hyperparathyroidism of renal origin: Secondary | ICD-10-CM | POA: Diagnosis not present

## 2019-03-01 DIAGNOSIS — Z992 Dependence on renal dialysis: Secondary | ICD-10-CM | POA: Diagnosis not present

## 2019-03-01 DIAGNOSIS — N186 End stage renal disease: Secondary | ICD-10-CM | POA: Diagnosis not present

## 2019-03-03 DIAGNOSIS — N2581 Secondary hyperparathyroidism of renal origin: Secondary | ICD-10-CM | POA: Diagnosis not present

## 2019-03-03 DIAGNOSIS — N186 End stage renal disease: Secondary | ICD-10-CM | POA: Diagnosis not present

## 2019-03-03 DIAGNOSIS — Z992 Dependence on renal dialysis: Secondary | ICD-10-CM | POA: Diagnosis not present

## 2019-03-06 DIAGNOSIS — N186 End stage renal disease: Secondary | ICD-10-CM | POA: Diagnosis not present

## 2019-03-06 DIAGNOSIS — N2581 Secondary hyperparathyroidism of renal origin: Secondary | ICD-10-CM | POA: Diagnosis not present

## 2019-03-06 DIAGNOSIS — Z992 Dependence on renal dialysis: Secondary | ICD-10-CM | POA: Diagnosis not present

## 2019-03-08 DIAGNOSIS — Z992 Dependence on renal dialysis: Secondary | ICD-10-CM | POA: Diagnosis not present

## 2019-03-08 DIAGNOSIS — N186 End stage renal disease: Secondary | ICD-10-CM | POA: Diagnosis not present

## 2019-03-08 DIAGNOSIS — N2581 Secondary hyperparathyroidism of renal origin: Secondary | ICD-10-CM | POA: Diagnosis not present

## 2019-03-10 DIAGNOSIS — N186 End stage renal disease: Secondary | ICD-10-CM | POA: Diagnosis not present

## 2019-03-10 DIAGNOSIS — N2581 Secondary hyperparathyroidism of renal origin: Secondary | ICD-10-CM | POA: Diagnosis not present

## 2019-03-10 DIAGNOSIS — Z992 Dependence on renal dialysis: Secondary | ICD-10-CM | POA: Diagnosis not present

## 2019-03-13 DIAGNOSIS — N186 End stage renal disease: Secondary | ICD-10-CM | POA: Diagnosis not present

## 2019-03-13 DIAGNOSIS — Z992 Dependence on renal dialysis: Secondary | ICD-10-CM | POA: Diagnosis not present

## 2019-03-13 DIAGNOSIS — N2581 Secondary hyperparathyroidism of renal origin: Secondary | ICD-10-CM | POA: Diagnosis not present

## 2019-03-15 DIAGNOSIS — Z992 Dependence on renal dialysis: Secondary | ICD-10-CM | POA: Diagnosis not present

## 2019-03-15 DIAGNOSIS — N186 End stage renal disease: Secondary | ICD-10-CM | POA: Diagnosis not present

## 2019-03-15 DIAGNOSIS — N2581 Secondary hyperparathyroidism of renal origin: Secondary | ICD-10-CM | POA: Diagnosis not present

## 2019-03-17 DIAGNOSIS — Z992 Dependence on renal dialysis: Secondary | ICD-10-CM | POA: Diagnosis not present

## 2019-03-17 DIAGNOSIS — N2581 Secondary hyperparathyroidism of renal origin: Secondary | ICD-10-CM | POA: Diagnosis not present

## 2019-03-17 DIAGNOSIS — N186 End stage renal disease: Secondary | ICD-10-CM | POA: Diagnosis not present

## 2019-03-19 DIAGNOSIS — N186 End stage renal disease: Secondary | ICD-10-CM | POA: Diagnosis not present

## 2019-03-19 DIAGNOSIS — N2581 Secondary hyperparathyroidism of renal origin: Secondary | ICD-10-CM | POA: Diagnosis not present

## 2019-03-19 DIAGNOSIS — Z992 Dependence on renal dialysis: Secondary | ICD-10-CM | POA: Diagnosis not present

## 2019-03-21 DIAGNOSIS — N2581 Secondary hyperparathyroidism of renal origin: Secondary | ICD-10-CM | POA: Diagnosis not present

## 2019-03-21 DIAGNOSIS — N186 End stage renal disease: Secondary | ICD-10-CM | POA: Diagnosis not present

## 2019-03-21 DIAGNOSIS — Z992 Dependence on renal dialysis: Secondary | ICD-10-CM | POA: Diagnosis not present

## 2019-03-24 DIAGNOSIS — N186 End stage renal disease: Secondary | ICD-10-CM | POA: Diagnosis not present

## 2019-03-24 DIAGNOSIS — Z992 Dependence on renal dialysis: Secondary | ICD-10-CM | POA: Diagnosis not present

## 2019-03-24 DIAGNOSIS — N2581 Secondary hyperparathyroidism of renal origin: Secondary | ICD-10-CM | POA: Diagnosis not present

## 2019-03-26 DIAGNOSIS — F329 Major depressive disorder, single episode, unspecified: Secondary | ICD-10-CM | POA: Diagnosis not present

## 2019-03-28 ENCOUNTER — Ambulatory Visit (INDEPENDENT_AMBULATORY_CARE_PROVIDER_SITE_OTHER): Payer: Medicare Other | Admitting: Family Medicine

## 2019-03-28 ENCOUNTER — Encounter: Payer: Self-pay | Admitting: Family Medicine

## 2019-03-28 ENCOUNTER — Other Ambulatory Visit: Payer: Self-pay

## 2019-03-28 VITALS — BP 114/60 | HR 97 | Ht 64.0 in | Wt 146.0 lb

## 2019-03-28 DIAGNOSIS — N186 End stage renal disease: Secondary | ICD-10-CM

## 2019-03-28 DIAGNOSIS — R112 Nausea with vomiting, unspecified: Secondary | ICD-10-CM | POA: Diagnosis not present

## 2019-03-28 DIAGNOSIS — Z992 Dependence on renal dialysis: Secondary | ICD-10-CM

## 2019-03-28 DIAGNOSIS — E039 Hypothyroidism, unspecified: Secondary | ICD-10-CM

## 2019-03-28 MED ORDER — OMEPRAZOLE 20 MG PO CPDR: 20.0000 mg | DELAYED_RELEASE_CAPSULE | Freq: Every day | ORAL | 6 refills | Status: DC

## 2019-03-28 MED ORDER — LEVOTHYROXINE SODIUM 112 MCG PO TABS: ORAL_TABLET | ORAL | 0 refills | Status: DC

## 2019-03-28 NOTE — Assessment & Plan Note (Signed)
Chronic in nature.  Has been without omeprazole and synthroid and worse since then, which could be contributing to resurgence.  If no improvement at visit next month, would consider return to GI for further work-up including gastric emptying study previously mentioned.

## 2019-03-28 NOTE — Assessment & Plan Note (Addendum)
Hs FSGS.  TTS, follows with Dr. Marval Regal.  On transplant list.  Left arm AV fistula intact.

## 2019-03-28 NOTE — Assessment & Plan Note (Signed)
Has been without synthroid 3-4 weeks.  Unclear reason why as no refill has been requested since was last filled in September.  Check TSH today and refilled synthroid.  Return in 1 month and will recheck TSH to ensure good control.

## 2019-03-28 NOTE — Progress Notes (Signed)
Subjective:  CC -- Annual Physical; With complaints of vomiting frequently after meals x a few months.  Nothing different in the last few months.  Denies unintentional weight loss.   Has had this before November 2019 and before.  Just went away on its own.  Thinks this has been worse since she has been without synthroid.  Has also been without omeprazole.    Hx FSGS, On HD TTS, on renal transplant list. Hx chronic nausea and vomiting, evaluated by GI with EGD in 2017, had been recommended to consider gastric emptying study if no improvement, but had improvement at that time.  Cardiovascular: - Dx Hypertension: yes  - Dx Hyperlipidemia: no, no indication for lipid panel  - Dx Obesity: no  - Physical Activity: no  - Diabetes: no, but wants A1c checked  Cancer: Colorectal >> Colonoscopy: no, no family hx  Lung >> Tobacco Use: no Breast >> Mammogram: no, no family history  Cervical/Endometrial >>  - Postmenopausal: no  - Vaginal Bleeding: yes, regular, LMP 03/27/2019 - Pap Smear: yes, UTD   - Previous Abnormal Pap: no Skin >> Suspicious lesions: no   Social: Alcohol Use: no  Tobacco Use: no  Other Drugs: no  Risky Sexual Behavior: no  Depression: no   - PHQ2 score: 1 Support and Life at Home: yes   Other: Flu Vaccine: yes  Pneumonia Vaccine: no   Review of Systems  Constitutional: Negative for fever and weight loss.  HENT: Negative for congestion and sore throat.   Eyes: Negative for blurred vision.  Respiratory: Negative for cough and shortness of breath.   Cardiovascular: Positive for chest pain (occasional, before or after eating, not exertional, in lower left chest/upper abdomen).  Gastrointestinal: Positive for nausea and vomiting. Negative for abdominal pain, constipation and diarrhea.  Genitourinary:       Does not make urine  Musculoskeletal: Negative for falls.  Skin: Negative for rash.  Neurological: Positive for headaches (occasional, come and go, get better  on their own, lasts a few minutes, sometimes longer).  Psychiatric/Behavioral: Negative for depression and substance abuse.     Past Medical History Patient Active Problem List   Diagnosis Date Noted  . Neck pain 05/04/2017  . GERD (gastroesophageal reflux disease) 05/04/2017  . Paranoia ([place]) 10/16/2015  . Hyperphosphatemia 07/11/2015  . Affective disorder ([place]) 07/06/2015  . Rectal bleeding 03/04/2015  . 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 Non-intractable vomiting with nausea 02/12/2015  . ESRD on dialysis ([place])   . Nystagmus 08/22/2013  . 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 Headache 07/13/2011  . ECZEMA, ATOPIC 11/05/2009  . Hypothyroidism 03/12/2009  . Obesity, Class II, BMI 35-39.9 02/26/2009  . Essential hypertension 02/26/2009  . Iron deficiency anemia 05/20/2008  . Anxiety state 06/23/2006  . MENTAL RETARDATION 06/23/2006    Medications- reviewed and updated Current Outpatient Medications  Medication Sig Dispense Refill  . acetaminophen (TYLENOL) 325 MG tablet Take 2 tablets (650 mg total) by mouth every 6 (six) hours as needed for moderate pain or fever. (Patient not taking: Reported on 05/04/2017) 30 tablet 0  . ARIPiprazole (ABILIFY) 5 MG tablet Take 5 mg by mouth at bedtime.    . benztropine (COGENTIN) 0.5 MG tablet Take 0.5 mg by mouth daily as needed for tremors.     . busPIRone (BUSPAR) 5 MG tablet Take 5 mg by mouth 3 (three) times daily.     . cinacalcet (SENSIPAR) 30 MG tablet Take 30 mg by mouth daily.    Marland Kitchen  ferrous sulfate 325 (65 FE) MG tablet Take 1 tablet (325 mg total) by mouth 2 (two) times daily. 180 tablet 3  . lamoTRIgine (LAMICTAL) 150 MG tablet Take 150 mg by mouth at bedtime.  2  . levothyroxine (SYNTHROID) 112 MCG tablet TAKE 1 TABLET BY MOUTH DAILY BEFORE BREAKFAST 30 tablet 0  . omeprazole (PRILOSEC) 20 MG capsule Take 1 capsule (20 mg total) by mouth daily. 30 capsule 6  . ondansetron (ZOFRAN ODT) 4 MG disintegrating tablet Take 1  tablet (4 mg total) by mouth every 8 (eight) hours as needed for nausea or vomiting. 30 tablet 0  . polyethylene glycol powder (GLYCOLAX/MIRALAX) powder Take 17 g by mouth daily as needed for mild constipation. Reported on 05/26/2015    . sertraline (ZOLOFT) 100 MG tablet Take 100 mg by mouth daily.     . sevelamer carbonate (RENVELA) 800 MG tablet Take 2,400 mg by mouth 3 (three) times daily with meals.     . SUMAtriptan (IMITREX) 25 MG tablet Take 1 tablet (25 mg total) by mouth every 2 (two) hours as needed for migraine. May repeat in 2 hours ONCE if headache persists or recurs. (Patient not taking: Reported on 05/04/2017) 10 tablet 1   No current facility-administered medications for this visit.     Objective: BP 114/60   Pulse 97   Ht 5\' 4"  (1.626 m)   Wt 146 lb (66.2 kg)   LMP 03/28/2019 (Exact Date)   SpO2 98%   BMI 25.06 kg/m  Gen: NAD, alert, cooperative with exam HEENT: NCAT, EOMI, PERRL CV: RRR, good S1/S2, no murmur, fistula in left arm with palpable thrill Resp: CTABL, no wheezes, non-labored Abd: Soft, Non Tender, Non Distended, BS present, no guarding or organomegaly Genital Exam: not done Ext: No edema, warm, no overlying redness of left arm fistula Neuro: Alert and oriented, No gross deficits   Assessment/Plan:  Hypothyroidism Has been without synthroid 3-4 weeks.  Unclear reason why as no refill has been requested since was last filled in September.  Check TSH today and refilled synthroid.  Return in 1 month and will recheck TSH to ensure good control.  Non-intractable vomiting with nausea Chronic in nature.  Has been without omeprazole and synthroid and worse since then, which could be contributing to resurgence.  If no improvement at visit next month, would consider return to GI for further work-up including gastric emptying study previously mentioned.  ESRD on dialysis ([place]) Hs FSGS.  TTS, follows with Dr. Marval Regal.  On transplant list.  Left arm AV fistula  intact.   Orders Placed This Encounter  Procedures  . TSH    Meds ordered this encounter  Medications  . omeprazole (PRILOSEC) 20 MG capsule    Sig: Take 1 capsule (20 mg total) by mouth daily.    Dispense:  30 capsule    Refill:  6  . levothyroxine (SYNTHROID) 112 MCG tablet    Sig: TAKE 1 TABLET BY MOUTH DAILY BEFORE BREAKFAST    Dispense:  30 tablet    Refill:  0     [place] Constable, DO, PGY-2 03/28/2019 6:11 PM

## 2019-03-28 NOTE — Patient Instructions (Signed)
Thank you for coming to see me today. It was a pleasure. Today we talked about:   Your thyroid medication: I have sent refills to the pharmacy and we will check your levels since you have been without it.  Please let me know if you ever run out again, because you can get very sick without it.  Please follow-up with me in 1 month.  If you have any questions or concerns, please do not hesitate to call the office at [phone].  Best,   [place] Constable, DO

## 2019-03-29 LAB — TSH: TSH: 5.88 u[IU]/mL — ABNORMAL HIGH (ref 0.450–4.500)

## 2019-03-30 DIAGNOSIS — F71 Moderate intellectual disabilities: Secondary | ICD-10-CM | POA: Diagnosis not present

## 2019-03-30 DIAGNOSIS — F329 Major depressive disorder, single episode, unspecified: Secondary | ICD-10-CM | POA: Diagnosis not present

## 2019-04-18 ENCOUNTER — Other Ambulatory Visit: Payer: Self-pay | Admitting: *Deleted

## 2019-04-18 DIAGNOSIS — E039 Hypothyroidism, unspecified: Secondary | ICD-10-CM

## 2019-04-18 MED ORDER — LEVOTHYROXINE SODIUM 112 MCG PO TABS: ORAL_TABLET | ORAL | 0 refills | Status: DC

## 2019-04-23 DIAGNOSIS — F319 Bipolar disorder, unspecified: Secondary | ICD-10-CM | POA: Diagnosis not present

## 2019-04-23 DIAGNOSIS — F329 Major depressive disorder, single episode, unspecified: Secondary | ICD-10-CM | POA: Diagnosis not present

## 2019-04-27 DIAGNOSIS — N186 End stage renal disease: Secondary | ICD-10-CM | POA: Diagnosis not present

## 2019-04-27 DIAGNOSIS — Z992 Dependence on renal dialysis: Secondary | ICD-10-CM | POA: Diagnosis not present

## 2019-04-27 DIAGNOSIS — I158 Other secondary hypertension: Secondary | ICD-10-CM | POA: Diagnosis not present

## 2019-04-29 DIAGNOSIS — D631 Anemia in chronic kidney disease: Secondary | ICD-10-CM | POA: Diagnosis not present

## 2019-04-29 DIAGNOSIS — N2581 Secondary hyperparathyroidism of renal origin: Secondary | ICD-10-CM | POA: Diagnosis not present

## 2019-04-29 DIAGNOSIS — Z992 Dependence on renal dialysis: Secondary | ICD-10-CM | POA: Diagnosis not present

## 2019-04-29 DIAGNOSIS — N186 End stage renal disease: Secondary | ICD-10-CM | POA: Diagnosis not present

## 2019-04-29 DIAGNOSIS — D509 Iron deficiency anemia, unspecified: Secondary | ICD-10-CM | POA: Diagnosis not present

## 2019-05-01 DIAGNOSIS — N186 End stage renal disease: Secondary | ICD-10-CM | POA: Diagnosis not present

## 2019-05-01 DIAGNOSIS — D631 Anemia in chronic kidney disease: Secondary | ICD-10-CM | POA: Diagnosis not present

## 2019-05-01 DIAGNOSIS — N2581 Secondary hyperparathyroidism of renal origin: Secondary | ICD-10-CM | POA: Diagnosis not present

## 2019-05-01 DIAGNOSIS — Z992 Dependence on renal dialysis: Secondary | ICD-10-CM | POA: Diagnosis not present

## 2019-05-01 DIAGNOSIS — D509 Iron deficiency anemia, unspecified: Secondary | ICD-10-CM | POA: Diagnosis not present

## 2019-05-03 DIAGNOSIS — D631 Anemia in chronic kidney disease: Secondary | ICD-10-CM | POA: Diagnosis not present

## 2019-05-03 DIAGNOSIS — N186 End stage renal disease: Secondary | ICD-10-CM | POA: Diagnosis not present

## 2019-05-03 DIAGNOSIS — Z992 Dependence on renal dialysis: Secondary | ICD-10-CM | POA: Diagnosis not present

## 2019-05-03 DIAGNOSIS — N2581 Secondary hyperparathyroidism of renal origin: Secondary | ICD-10-CM | POA: Diagnosis not present

## 2019-05-03 DIAGNOSIS — D509 Iron deficiency anemia, unspecified: Secondary | ICD-10-CM | POA: Diagnosis not present

## 2019-05-04 ENCOUNTER — Ambulatory Visit: Payer: Medicare Other | Admitting: Family Medicine

## 2019-05-05 DIAGNOSIS — N2581 Secondary hyperparathyroidism of renal origin: Secondary | ICD-10-CM | POA: Diagnosis not present

## 2019-05-05 DIAGNOSIS — Z992 Dependence on renal dialysis: Secondary | ICD-10-CM | POA: Diagnosis not present

## 2019-05-05 DIAGNOSIS — D631 Anemia in chronic kidney disease: Secondary | ICD-10-CM | POA: Diagnosis not present

## 2019-05-05 DIAGNOSIS — N186 End stage renal disease: Secondary | ICD-10-CM | POA: Diagnosis not present

## 2019-05-05 DIAGNOSIS — D509 Iron deficiency anemia, unspecified: Secondary | ICD-10-CM | POA: Diagnosis not present

## 2019-05-07 DIAGNOSIS — F329 Major depressive disorder, single episode, unspecified: Secondary | ICD-10-CM | POA: Diagnosis not present

## 2019-05-07 DIAGNOSIS — F319 Bipolar disorder, unspecified: Secondary | ICD-10-CM | POA: Diagnosis not present

## 2019-05-08 DIAGNOSIS — D631 Anemia in chronic kidney disease: Secondary | ICD-10-CM | POA: Diagnosis not present

## 2019-05-08 DIAGNOSIS — D509 Iron deficiency anemia, unspecified: Secondary | ICD-10-CM | POA: Diagnosis not present

## 2019-05-08 DIAGNOSIS — N2581 Secondary hyperparathyroidism of renal origin: Secondary | ICD-10-CM | POA: Diagnosis not present

## 2019-05-08 DIAGNOSIS — N186 End stage renal disease: Secondary | ICD-10-CM | POA: Diagnosis not present

## 2019-05-08 DIAGNOSIS — Z992 Dependence on renal dialysis: Secondary | ICD-10-CM | POA: Diagnosis not present

## 2019-05-10 DIAGNOSIS — N186 End stage renal disease: Secondary | ICD-10-CM | POA: Diagnosis not present

## 2019-05-10 DIAGNOSIS — D509 Iron deficiency anemia, unspecified: Secondary | ICD-10-CM | POA: Diagnosis not present

## 2019-05-10 DIAGNOSIS — N2581 Secondary hyperparathyroidism of renal origin: Secondary | ICD-10-CM | POA: Diagnosis not present

## 2019-05-10 DIAGNOSIS — D631 Anemia in chronic kidney disease: Secondary | ICD-10-CM | POA: Diagnosis not present

## 2019-05-10 DIAGNOSIS — Z992 Dependence on renal dialysis: Secondary | ICD-10-CM | POA: Diagnosis not present

## 2019-05-11 DIAGNOSIS — I340 Nonrheumatic mitral (valve) insufficiency: Secondary | ICD-10-CM | POA: Diagnosis not present

## 2019-05-11 DIAGNOSIS — N186 End stage renal disease: Secondary | ICD-10-CM | POA: Diagnosis not present

## 2019-05-11 DIAGNOSIS — I120 Hypertensive chronic kidney disease with stage 5 chronic kidney disease or end stage renal disease: Secondary | ICD-10-CM | POA: Diagnosis not present

## 2019-05-11 DIAGNOSIS — Z01818 Encounter for other preprocedural examination: Secondary | ICD-10-CM | POA: Diagnosis not present

## 2019-05-11 DIAGNOSIS — I083 Combined rheumatic disorders of mitral, aortic and tricuspid valves: Secondary | ICD-10-CM | POA: Diagnosis not present

## 2019-05-12 DIAGNOSIS — D509 Iron deficiency anemia, unspecified: Secondary | ICD-10-CM | POA: Diagnosis not present

## 2019-05-12 DIAGNOSIS — D631 Anemia in chronic kidney disease: Secondary | ICD-10-CM | POA: Diagnosis not present

## 2019-05-12 DIAGNOSIS — N186 End stage renal disease: Secondary | ICD-10-CM | POA: Diagnosis not present

## 2019-05-12 DIAGNOSIS — N2581 Secondary hyperparathyroidism of renal origin: Secondary | ICD-10-CM | POA: Diagnosis not present

## 2019-05-12 DIAGNOSIS — Z992 Dependence on renal dialysis: Secondary | ICD-10-CM | POA: Diagnosis not present

## 2019-05-15 DIAGNOSIS — D509 Iron deficiency anemia, unspecified: Secondary | ICD-10-CM | POA: Diagnosis not present

## 2019-05-15 DIAGNOSIS — Z992 Dependence on renal dialysis: Secondary | ICD-10-CM | POA: Diagnosis not present

## 2019-05-15 DIAGNOSIS — N2581 Secondary hyperparathyroidism of renal origin: Secondary | ICD-10-CM | POA: Diagnosis not present

## 2019-05-15 DIAGNOSIS — D631 Anemia in chronic kidney disease: Secondary | ICD-10-CM | POA: Diagnosis not present

## 2019-05-15 DIAGNOSIS — N186 End stage renal disease: Secondary | ICD-10-CM | POA: Diagnosis not present

## 2019-05-17 DIAGNOSIS — N2581 Secondary hyperparathyroidism of renal origin: Secondary | ICD-10-CM | POA: Diagnosis not present

## 2019-05-17 DIAGNOSIS — N186 End stage renal disease: Secondary | ICD-10-CM | POA: Diagnosis not present

## 2019-05-17 DIAGNOSIS — Z992 Dependence on renal dialysis: Secondary | ICD-10-CM | POA: Diagnosis not present

## 2019-05-17 DIAGNOSIS — D631 Anemia in chronic kidney disease: Secondary | ICD-10-CM | POA: Diagnosis not present

## 2019-05-17 DIAGNOSIS — D509 Iron deficiency anemia, unspecified: Secondary | ICD-10-CM | POA: Diagnosis not present

## 2019-05-19 DIAGNOSIS — D509 Iron deficiency anemia, unspecified: Secondary | ICD-10-CM | POA: Diagnosis not present

## 2019-05-19 DIAGNOSIS — Z992 Dependence on renal dialysis: Secondary | ICD-10-CM | POA: Diagnosis not present

## 2019-05-19 DIAGNOSIS — N186 End stage renal disease: Secondary | ICD-10-CM | POA: Diagnosis not present

## 2019-05-19 DIAGNOSIS — N2581 Secondary hyperparathyroidism of renal origin: Secondary | ICD-10-CM | POA: Diagnosis not present

## 2019-05-19 DIAGNOSIS — D631 Anemia in chronic kidney disease: Secondary | ICD-10-CM | POA: Diagnosis not present

## 2019-05-21 ENCOUNTER — Ambulatory Visit: Payer: Medicare Other | Admitting: Family Medicine

## 2019-05-22 DIAGNOSIS — D631 Anemia in chronic kidney disease: Secondary | ICD-10-CM | POA: Diagnosis not present

## 2019-05-22 DIAGNOSIS — Z992 Dependence on renal dialysis: Secondary | ICD-10-CM | POA: Diagnosis not present

## 2019-05-22 DIAGNOSIS — N186 End stage renal disease: Secondary | ICD-10-CM | POA: Diagnosis not present

## 2019-05-22 DIAGNOSIS — N2581 Secondary hyperparathyroidism of renal origin: Secondary | ICD-10-CM | POA: Diagnosis not present

## 2019-05-22 DIAGNOSIS — D509 Iron deficiency anemia, unspecified: Secondary | ICD-10-CM | POA: Diagnosis not present

## 2019-05-24 DIAGNOSIS — N186 End stage renal disease: Secondary | ICD-10-CM | POA: Diagnosis not present

## 2019-05-24 DIAGNOSIS — D509 Iron deficiency anemia, unspecified: Secondary | ICD-10-CM | POA: Diagnosis not present

## 2019-05-24 DIAGNOSIS — D631 Anemia in chronic kidney disease: Secondary | ICD-10-CM | POA: Diagnosis not present

## 2019-05-24 DIAGNOSIS — N2581 Secondary hyperparathyroidism of renal origin: Secondary | ICD-10-CM | POA: Diagnosis not present

## 2019-05-24 DIAGNOSIS — Z992 Dependence on renal dialysis: Secondary | ICD-10-CM | POA: Diagnosis not present

## 2019-05-26 DIAGNOSIS — D509 Iron deficiency anemia, unspecified: Secondary | ICD-10-CM | POA: Diagnosis not present

## 2019-05-26 DIAGNOSIS — N2581 Secondary hyperparathyroidism of renal origin: Secondary | ICD-10-CM | POA: Diagnosis not present

## 2019-05-26 DIAGNOSIS — N186 End stage renal disease: Secondary | ICD-10-CM | POA: Diagnosis not present

## 2019-05-26 DIAGNOSIS — D631 Anemia in chronic kidney disease: Secondary | ICD-10-CM | POA: Diagnosis not present

## 2019-05-26 DIAGNOSIS — Z992 Dependence on renal dialysis: Secondary | ICD-10-CM | POA: Diagnosis not present

## 2019-05-28 ENCOUNTER — Other Ambulatory Visit: Payer: Self-pay | Admitting: *Deleted

## 2019-05-28 DIAGNOSIS — N186 End stage renal disease: Secondary | ICD-10-CM | POA: Diagnosis not present

## 2019-05-28 DIAGNOSIS — Z992 Dependence on renal dialysis: Secondary | ICD-10-CM | POA: Diagnosis not present

## 2019-05-28 DIAGNOSIS — E039 Hypothyroidism, unspecified: Secondary | ICD-10-CM

## 2019-05-28 DIAGNOSIS — I158 Other secondary hypertension: Secondary | ICD-10-CM | POA: Diagnosis not present

## 2019-05-29 DIAGNOSIS — Z992 Dependence on renal dialysis: Secondary | ICD-10-CM | POA: Diagnosis not present

## 2019-05-29 DIAGNOSIS — N2581 Secondary hyperparathyroidism of renal origin: Secondary | ICD-10-CM | POA: Diagnosis not present

## 2019-05-29 DIAGNOSIS — N186 End stage renal disease: Secondary | ICD-10-CM | POA: Diagnosis not present

## 2019-05-29 DIAGNOSIS — D631 Anemia in chronic kidney disease: Secondary | ICD-10-CM | POA: Diagnosis not present

## 2019-05-29 DIAGNOSIS — D509 Iron deficiency anemia, unspecified: Secondary | ICD-10-CM | POA: Diagnosis not present

## 2019-05-29 MED ORDER — LEVOTHYROXINE SODIUM 112 MCG PO TABS: ORAL_TABLET | ORAL | 0 refills | Status: DC

## 2019-05-31 DIAGNOSIS — D631 Anemia in chronic kidney disease: Secondary | ICD-10-CM | POA: Diagnosis not present

## 2019-05-31 DIAGNOSIS — N186 End stage renal disease: Secondary | ICD-10-CM | POA: Diagnosis not present

## 2019-05-31 DIAGNOSIS — D509 Iron deficiency anemia, unspecified: Secondary | ICD-10-CM | POA: Diagnosis not present

## 2019-05-31 DIAGNOSIS — Z992 Dependence on renal dialysis: Secondary | ICD-10-CM | POA: Diagnosis not present

## 2019-05-31 DIAGNOSIS — N2581 Secondary hyperparathyroidism of renal origin: Secondary | ICD-10-CM | POA: Diagnosis not present

## 2019-06-01 DIAGNOSIS — I871 Compression of vein: Secondary | ICD-10-CM | POA: Diagnosis not present

## 2019-06-01 DIAGNOSIS — N186 End stage renal disease: Secondary | ICD-10-CM | POA: Diagnosis not present

## 2019-06-01 DIAGNOSIS — T82858A Stenosis of vascular prosthetic devices, implants and grafts, initial encounter: Secondary | ICD-10-CM | POA: Diagnosis not present

## 2019-06-01 DIAGNOSIS — Z992 Dependence on renal dialysis: Secondary | ICD-10-CM | POA: Diagnosis not present

## 2019-06-02 DIAGNOSIS — D509 Iron deficiency anemia, unspecified: Secondary | ICD-10-CM | POA: Diagnosis not present

## 2019-06-02 DIAGNOSIS — D631 Anemia in chronic kidney disease: Secondary | ICD-10-CM | POA: Diagnosis not present

## 2019-06-02 DIAGNOSIS — N186 End stage renal disease: Secondary | ICD-10-CM | POA: Diagnosis not present

## 2019-06-02 DIAGNOSIS — N2581 Secondary hyperparathyroidism of renal origin: Secondary | ICD-10-CM | POA: Diagnosis not present

## 2019-06-02 DIAGNOSIS — Z992 Dependence on renal dialysis: Secondary | ICD-10-CM | POA: Diagnosis not present

## 2019-06-04 DIAGNOSIS — F422 Mixed obsessional thoughts and acts: Secondary | ICD-10-CM | POA: Diagnosis not present

## 2019-06-04 DIAGNOSIS — F319 Bipolar disorder, unspecified: Secondary | ICD-10-CM | POA: Diagnosis not present

## 2019-06-05 DIAGNOSIS — Z992 Dependence on renal dialysis: Secondary | ICD-10-CM | POA: Diagnosis not present

## 2019-06-05 DIAGNOSIS — N186 End stage renal disease: Secondary | ICD-10-CM | POA: Diagnosis not present

## 2019-06-05 DIAGNOSIS — D631 Anemia in chronic kidney disease: Secondary | ICD-10-CM | POA: Diagnosis not present

## 2019-06-05 DIAGNOSIS — D509 Iron deficiency anemia, unspecified: Secondary | ICD-10-CM | POA: Diagnosis not present

## 2019-06-05 DIAGNOSIS — N2581 Secondary hyperparathyroidism of renal origin: Secondary | ICD-10-CM | POA: Diagnosis not present

## 2019-06-07 DIAGNOSIS — Z992 Dependence on renal dialysis: Secondary | ICD-10-CM | POA: Diagnosis not present

## 2019-06-07 DIAGNOSIS — D631 Anemia in chronic kidney disease: Secondary | ICD-10-CM | POA: Diagnosis not present

## 2019-06-07 DIAGNOSIS — N2581 Secondary hyperparathyroidism of renal origin: Secondary | ICD-10-CM | POA: Diagnosis not present

## 2019-06-07 DIAGNOSIS — D509 Iron deficiency anemia, unspecified: Secondary | ICD-10-CM | POA: Diagnosis not present

## 2019-06-07 DIAGNOSIS — N186 End stage renal disease: Secondary | ICD-10-CM | POA: Diagnosis not present

## 2019-06-09 DIAGNOSIS — N2581 Secondary hyperparathyroidism of renal origin: Secondary | ICD-10-CM | POA: Diagnosis not present

## 2019-06-09 DIAGNOSIS — Z992 Dependence on renal dialysis: Secondary | ICD-10-CM | POA: Diagnosis not present

## 2019-06-09 DIAGNOSIS — D631 Anemia in chronic kidney disease: Secondary | ICD-10-CM | POA: Diagnosis not present

## 2019-06-09 DIAGNOSIS — N186 End stage renal disease: Secondary | ICD-10-CM | POA: Diagnosis not present

## 2019-06-09 DIAGNOSIS — D509 Iron deficiency anemia, unspecified: Secondary | ICD-10-CM | POA: Diagnosis not present

## 2019-06-12 DIAGNOSIS — D509 Iron deficiency anemia, unspecified: Secondary | ICD-10-CM | POA: Diagnosis not present

## 2019-06-12 DIAGNOSIS — Z992 Dependence on renal dialysis: Secondary | ICD-10-CM | POA: Diagnosis not present

## 2019-06-12 DIAGNOSIS — N186 End stage renal disease: Secondary | ICD-10-CM | POA: Diagnosis not present

## 2019-06-12 DIAGNOSIS — N2581 Secondary hyperparathyroidism of renal origin: Secondary | ICD-10-CM | POA: Diagnosis not present

## 2019-06-12 DIAGNOSIS — D631 Anemia in chronic kidney disease: Secondary | ICD-10-CM | POA: Diagnosis not present

## 2019-06-15 ENCOUNTER — Encounter: Payer: Self-pay | Admitting: Family Medicine

## 2019-06-15 ENCOUNTER — Ambulatory Visit (INDEPENDENT_AMBULATORY_CARE_PROVIDER_SITE_OTHER): Payer: Medicare Other | Admitting: Family Medicine

## 2019-06-15 ENCOUNTER — Other Ambulatory Visit: Payer: Self-pay

## 2019-06-15 VITALS — BP 112/62 | HR 84 | Wt 147.8 lb

## 2019-06-15 DIAGNOSIS — D509 Iron deficiency anemia, unspecified: Secondary | ICD-10-CM | POA: Diagnosis not present

## 2019-06-15 DIAGNOSIS — N2581 Secondary hyperparathyroidism of renal origin: Secondary | ICD-10-CM | POA: Diagnosis not present

## 2019-06-15 DIAGNOSIS — K5904 Chronic idiopathic constipation: Secondary | ICD-10-CM | POA: Diagnosis not present

## 2019-06-15 DIAGNOSIS — Z992 Dependence on renal dialysis: Secondary | ICD-10-CM | POA: Diagnosis not present

## 2019-06-15 DIAGNOSIS — N186 End stage renal disease: Secondary | ICD-10-CM

## 2019-06-15 DIAGNOSIS — D631 Anemia in chronic kidney disease: Secondary | ICD-10-CM | POA: Diagnosis not present

## 2019-06-15 DIAGNOSIS — Z23 Encounter for immunization: Secondary | ICD-10-CM

## 2019-06-15 DIAGNOSIS — R112 Nausea with vomiting, unspecified: Secondary | ICD-10-CM

## 2019-06-15 DIAGNOSIS — E039 Hypothyroidism, unspecified: Secondary | ICD-10-CM

## 2019-06-15 DIAGNOSIS — R7309 Other abnormal glucose: Secondary | ICD-10-CM | POA: Insufficient documentation

## 2019-06-15 LAB — POCT GLYCOSYLATED HEMOGLOBIN (HGB A1C): Hemoglobin A1C: 4.7 % (ref 4.0–5.6)

## 2019-06-15 MED ORDER — OMEPRAZOLE 40 MG PO CPDR: 40.0000 mg | DELAYED_RELEASE_CAPSULE | Freq: Every day | ORAL | 1 refills | Status: DC

## 2019-06-15 MED ORDER — POLYETHYLENE GLYCOL 3350 17 GM/SCOOP PO POWD: 17.0000 g | Freq: Every day | ORAL | 3 refills | Status: AC | PRN

## 2019-06-15 MED ORDER — TETANUS-DIPHTH-ACELL PERTUSSIS 5-2.5-18.5 LF-MCG/0.5 IM SUSP: 0.5000 mL | Freq: Once | INTRAMUSCULAR | 0 refills | Status: AC

## 2019-06-15 NOTE — Assessment & Plan Note (Signed)
Follows with Dr. Marval Regal with [facility].  Currently on transplant list at [facility].  Will request records from Dr. Marval Regal for BMP and CBC results.

## 2019-06-15 NOTE — Assessment & Plan Note (Signed)
Reports she had a high glucose level on a lab check and would like to be checked for diabetes.  She does not think that she is eating well and states that she would like to make sure that she does not have diabetes.  We will also attempt to obtain lab results from her nephrologist.

## 2019-06-15 NOTE — Progress Notes (Signed)
   CHIEF COMPLAINT / HPI:  1 Hypothyroidism Patient had been without synthroid for 3-4 weeks at last check on 12/2 TSH was 5.88 on 12/2 She reports occasional fatigue, denies weight gain or loss   2 ESRD on HD Hx FSGS On Transplant list Follows with nephrology Had a stent placed in a vein recently for HD access assistance, on left  3 Concern for Diabetes Patient is worried that she has diabetes and would like to have A1c check today Reports that she thinks one of her blood sugars was a little high on her lab check at HD   4 Chronic Nausea and Vomiting Happens usually eating Not always Has had this before, but this time she has been dealing with it for about a month Has not tried anything for it No fevers BMs are normal Uses miralax and does well with this, requesting refill Takes omeprazole QD and states it helps, but mom thinks it might not be with what is going on Otherwise in her usual state of health  5 Chronic constipation Patient's constipation is well controlled Mother reports MiraLAX use over-the-counter, but is asking for prescription MiraLAX as she states that this is cheaper for them  PERTINENT  PMH / PSH: ESRD on HD, history of FSGS, hypothyroidism   OBJECTIVE: BP 112/62   Pulse 84   Wt 147 lb 12.8 oz (67 kg)   LMP 06/04/2019 (Approximate)   SpO2 100%   BMI 25.37 kg/m    Physical Exam:  General: 34 y.o. female in NAD HEENT: NCAT Neck: Supple, no thyromegaly Cardio: RRR no m/r/g Lungs: CTAB, no wheezing, no rhonchi, no crackles, no IWOB on RA Abdomen: Soft, non-tender to palpation, non-distended Skin: warm and dry Extremities: No edema, bruit noted at left arm fistula   ASSESSMENT / PLAN:  Hypothyroidism Currently on Synthroid 112 mcg daily.  Reports compliance.  Last TSH was elevated as she was without her medication for 3 to 4 weeks.  We will recheck TSH today to ensure improvement with restarting regimen.  Will adjust TSH as needed.  ESRD on  dialysis [facility]) Follows with Dr. Marval Regal with [facility].  Currently on transplant list at [facility].  Will request records from Dr. Marval Regal for BMP and CBC results.  Abnormal glucose Reports she had a high glucose level on a lab check and would like to be checked for diabetes.  She does not think that she is eating well and states that she would like to make sure that she does not have diabetes.  We will also attempt to obtain lab results from her nephrologist.  Non-intractable vomiting with nausea Chronic in nature, endoscopy in 2017.  At that time, GI had noted would like to perform a gastric emptying study for the patient.  Does not appear that she ever had this performed.  Since then she has been taking omeprazole again and having a recurrence, will try increased dose of omeprazole.  If no improvement with this in the next month, would consider sending her back to GI for gastric emptying study.  Chronic idiopathic constipation Well-controlled with MiraLAX.  Rx sent.     Cleophas Dunker, [place]

## 2019-06-15 NOTE — Assessment & Plan Note (Signed)
Well-controlled with MiraLAX.  Rx sent.

## 2019-06-15 NOTE — Assessment & Plan Note (Signed)
Chronic in nature, endoscopy in 2017.  At that time, GI had noted would like to perform a gastric emptying study for the patient.  Does not appear that she ever had this performed.  Since then she has been taking omeprazole again and having a recurrence, will try increased dose of omeprazole.  If no improvement with this in the next month, would consider sending her back to GI for gastric emptying study.

## 2019-06-15 NOTE — Patient Instructions (Signed)
Thank you for coming to see me today. It was a pleasure. Today we talked about:   We will check your thyroid and A1c today.  We will try to get your labs from your kidney doctor.  Let's try the increase dose of prilosec for 1 month.  Come back in a month to see if this helps.  Take the Tetanus prescription to the pharmacy.  Please follow-up with me in 1 month.  If you have any questions or concerns, please do not hesitate to call the office at [phone].  Best,   [place] Constable, DO

## 2019-06-15 NOTE — Assessment & Plan Note (Signed)
Currently on Synthroid 112 mcg daily.  Reports compliance.  Last TSH was elevated as she was without her medication for 3 to 4 weeks.  We will recheck TSH today to ensure improvement with restarting regimen.  Will adjust TSH as needed.

## 2019-06-16 DIAGNOSIS — D631 Anemia in chronic kidney disease: Secondary | ICD-10-CM | POA: Diagnosis not present

## 2019-06-16 DIAGNOSIS — D509 Iron deficiency anemia, unspecified: Secondary | ICD-10-CM | POA: Diagnosis not present

## 2019-06-16 DIAGNOSIS — Z992 Dependence on renal dialysis: Secondary | ICD-10-CM | POA: Diagnosis not present

## 2019-06-16 DIAGNOSIS — N186 End stage renal disease: Secondary | ICD-10-CM | POA: Diagnosis not present

## 2019-06-16 DIAGNOSIS — N2581 Secondary hyperparathyroidism of renal origin: Secondary | ICD-10-CM | POA: Diagnosis not present

## 2019-06-16 LAB — TSH: TSH: 1.59 u[IU]/mL (ref 0.450–4.500)

## 2019-06-19 DIAGNOSIS — N186 End stage renal disease: Secondary | ICD-10-CM | POA: Diagnosis not present

## 2019-06-19 DIAGNOSIS — N2581 Secondary hyperparathyroidism of renal origin: Secondary | ICD-10-CM | POA: Diagnosis not present

## 2019-06-19 DIAGNOSIS — D631 Anemia in chronic kidney disease: Secondary | ICD-10-CM | POA: Diagnosis not present

## 2019-06-19 DIAGNOSIS — Z992 Dependence on renal dialysis: Secondary | ICD-10-CM | POA: Diagnosis not present

## 2019-06-19 DIAGNOSIS — D509 Iron deficiency anemia, unspecified: Secondary | ICD-10-CM | POA: Diagnosis not present

## 2019-06-21 DIAGNOSIS — N2581 Secondary hyperparathyroidism of renal origin: Secondary | ICD-10-CM | POA: Diagnosis not present

## 2019-06-21 DIAGNOSIS — D509 Iron deficiency anemia, unspecified: Secondary | ICD-10-CM | POA: Diagnosis not present

## 2019-06-21 DIAGNOSIS — N186 End stage renal disease: Secondary | ICD-10-CM | POA: Diagnosis not present

## 2019-06-21 DIAGNOSIS — D631 Anemia in chronic kidney disease: Secondary | ICD-10-CM | POA: Diagnosis not present

## 2019-06-21 DIAGNOSIS — Z992 Dependence on renal dialysis: Secondary | ICD-10-CM | POA: Diagnosis not present

## 2019-06-23 DIAGNOSIS — N186 End stage renal disease: Secondary | ICD-10-CM | POA: Diagnosis not present

## 2019-06-23 DIAGNOSIS — D631 Anemia in chronic kidney disease: Secondary | ICD-10-CM | POA: Diagnosis not present

## 2019-06-23 DIAGNOSIS — Z992 Dependence on renal dialysis: Secondary | ICD-10-CM | POA: Diagnosis not present

## 2019-06-23 DIAGNOSIS — D509 Iron deficiency anemia, unspecified: Secondary | ICD-10-CM | POA: Diagnosis not present

## 2019-06-23 DIAGNOSIS — N2581 Secondary hyperparathyroidism of renal origin: Secondary | ICD-10-CM | POA: Diagnosis not present

## 2019-06-25 ENCOUNTER — Other Ambulatory Visit: Payer: Self-pay

## 2019-06-25 DIAGNOSIS — I158 Other secondary hypertension: Secondary | ICD-10-CM | POA: Diagnosis not present

## 2019-06-25 DIAGNOSIS — N186 End stage renal disease: Secondary | ICD-10-CM | POA: Diagnosis not present

## 2019-06-25 DIAGNOSIS — E039 Hypothyroidism, unspecified: Secondary | ICD-10-CM

## 2019-06-25 DIAGNOSIS — Z992 Dependence on renal dialysis: Secondary | ICD-10-CM | POA: Diagnosis not present

## 2019-06-25 MED ORDER — LEVOTHYROXINE SODIUM 112 MCG PO TABS: ORAL_TABLET | ORAL | 3 refills | Status: DC

## 2019-06-26 DIAGNOSIS — D631 Anemia in chronic kidney disease: Secondary | ICD-10-CM | POA: Diagnosis not present

## 2019-06-26 DIAGNOSIS — Z992 Dependence on renal dialysis: Secondary | ICD-10-CM | POA: Diagnosis not present

## 2019-06-26 DIAGNOSIS — N186 End stage renal disease: Secondary | ICD-10-CM | POA: Diagnosis not present

## 2019-06-26 DIAGNOSIS — N2581 Secondary hyperparathyroidism of renal origin: Secondary | ICD-10-CM | POA: Diagnosis not present

## 2019-06-26 DIAGNOSIS — D509 Iron deficiency anemia, unspecified: Secondary | ICD-10-CM | POA: Diagnosis not present

## 2019-06-26 DIAGNOSIS — Z23 Encounter for immunization: Secondary | ICD-10-CM | POA: Diagnosis not present

## 2019-06-27 DIAGNOSIS — F329 Major depressive disorder, single episode, unspecified: Secondary | ICD-10-CM | POA: Diagnosis not present

## 2019-06-27 DIAGNOSIS — F71 Moderate intellectual disabilities: Secondary | ICD-10-CM | POA: Diagnosis not present

## 2019-06-27 DIAGNOSIS — F422 Mixed obsessional thoughts and acts: Secondary | ICD-10-CM | POA: Diagnosis not present

## 2019-06-27 DIAGNOSIS — F319 Bipolar disorder, unspecified: Secondary | ICD-10-CM | POA: Diagnosis not present

## 2019-06-28 DIAGNOSIS — Z23 Encounter for immunization: Secondary | ICD-10-CM | POA: Diagnosis not present

## 2019-06-28 DIAGNOSIS — N186 End stage renal disease: Secondary | ICD-10-CM | POA: Diagnosis not present

## 2019-06-28 DIAGNOSIS — N2581 Secondary hyperparathyroidism of renal origin: Secondary | ICD-10-CM | POA: Diagnosis not present

## 2019-06-28 DIAGNOSIS — Z992 Dependence on renal dialysis: Secondary | ICD-10-CM | POA: Diagnosis not present

## 2019-06-28 DIAGNOSIS — D631 Anemia in chronic kidney disease: Secondary | ICD-10-CM | POA: Diagnosis not present

## 2019-06-28 DIAGNOSIS — D509 Iron deficiency anemia, unspecified: Secondary | ICD-10-CM | POA: Diagnosis not present

## 2019-06-30 DIAGNOSIS — D631 Anemia in chronic kidney disease: Secondary | ICD-10-CM | POA: Diagnosis not present

## 2019-06-30 DIAGNOSIS — N186 End stage renal disease: Secondary | ICD-10-CM | POA: Diagnosis not present

## 2019-06-30 DIAGNOSIS — D509 Iron deficiency anemia, unspecified: Secondary | ICD-10-CM | POA: Diagnosis not present

## 2019-06-30 DIAGNOSIS — Z23 Encounter for immunization: Secondary | ICD-10-CM | POA: Diagnosis not present

## 2019-06-30 DIAGNOSIS — Z992 Dependence on renal dialysis: Secondary | ICD-10-CM | POA: Diagnosis not present

## 2019-06-30 DIAGNOSIS — N2581 Secondary hyperparathyroidism of renal origin: Secondary | ICD-10-CM | POA: Diagnosis not present

## 2019-07-03 DIAGNOSIS — N186 End stage renal disease: Secondary | ICD-10-CM | POA: Diagnosis not present

## 2019-07-03 DIAGNOSIS — N2581 Secondary hyperparathyroidism of renal origin: Secondary | ICD-10-CM | POA: Diagnosis not present

## 2019-07-03 DIAGNOSIS — Z23 Encounter for immunization: Secondary | ICD-10-CM | POA: Diagnosis not present

## 2019-07-03 DIAGNOSIS — D509 Iron deficiency anemia, unspecified: Secondary | ICD-10-CM | POA: Diagnosis not present

## 2019-07-03 DIAGNOSIS — D631 Anemia in chronic kidney disease: Secondary | ICD-10-CM | POA: Diagnosis not present

## 2019-07-03 DIAGNOSIS — Z992 Dependence on renal dialysis: Secondary | ICD-10-CM | POA: Diagnosis not present

## 2019-07-05 DIAGNOSIS — D509 Iron deficiency anemia, unspecified: Secondary | ICD-10-CM | POA: Diagnosis not present

## 2019-07-05 DIAGNOSIS — N186 End stage renal disease: Secondary | ICD-10-CM | POA: Diagnosis not present

## 2019-07-05 DIAGNOSIS — D631 Anemia in chronic kidney disease: Secondary | ICD-10-CM | POA: Diagnosis not present

## 2019-07-05 DIAGNOSIS — Z992 Dependence on renal dialysis: Secondary | ICD-10-CM | POA: Diagnosis not present

## 2019-07-05 DIAGNOSIS — N2581 Secondary hyperparathyroidism of renal origin: Secondary | ICD-10-CM | POA: Diagnosis not present

## 2019-07-05 DIAGNOSIS — Z23 Encounter for immunization: Secondary | ICD-10-CM | POA: Diagnosis not present

## 2019-07-07 DIAGNOSIS — N186 End stage renal disease: Secondary | ICD-10-CM | POA: Diagnosis not present

## 2019-07-07 DIAGNOSIS — Z23 Encounter for immunization: Secondary | ICD-10-CM | POA: Diagnosis not present

## 2019-07-07 DIAGNOSIS — D631 Anemia in chronic kidney disease: Secondary | ICD-10-CM | POA: Diagnosis not present

## 2019-07-07 DIAGNOSIS — Z992 Dependence on renal dialysis: Secondary | ICD-10-CM | POA: Diagnosis not present

## 2019-07-07 DIAGNOSIS — N2581 Secondary hyperparathyroidism of renal origin: Secondary | ICD-10-CM | POA: Diagnosis not present

## 2019-07-07 DIAGNOSIS — D509 Iron deficiency anemia, unspecified: Secondary | ICD-10-CM | POA: Diagnosis not present

## 2019-07-10 DIAGNOSIS — D631 Anemia in chronic kidney disease: Secondary | ICD-10-CM | POA: Diagnosis not present

## 2019-07-10 DIAGNOSIS — Z23 Encounter for immunization: Secondary | ICD-10-CM | POA: Diagnosis not present

## 2019-07-10 DIAGNOSIS — N186 End stage renal disease: Secondary | ICD-10-CM | POA: Diagnosis not present

## 2019-07-10 DIAGNOSIS — N2581 Secondary hyperparathyroidism of renal origin: Secondary | ICD-10-CM | POA: Diagnosis not present

## 2019-07-10 DIAGNOSIS — Z992 Dependence on renal dialysis: Secondary | ICD-10-CM | POA: Diagnosis not present

## 2019-07-10 DIAGNOSIS — D509 Iron deficiency anemia, unspecified: Secondary | ICD-10-CM | POA: Diagnosis not present

## 2019-07-12 DIAGNOSIS — D509 Iron deficiency anemia, unspecified: Secondary | ICD-10-CM | POA: Diagnosis not present

## 2019-07-12 DIAGNOSIS — Z992 Dependence on renal dialysis: Secondary | ICD-10-CM | POA: Diagnosis not present

## 2019-07-12 DIAGNOSIS — Z23 Encounter for immunization: Secondary | ICD-10-CM | POA: Diagnosis not present

## 2019-07-12 DIAGNOSIS — N186 End stage renal disease: Secondary | ICD-10-CM | POA: Diagnosis not present

## 2019-07-12 DIAGNOSIS — D631 Anemia in chronic kidney disease: Secondary | ICD-10-CM | POA: Diagnosis not present

## 2019-07-12 DIAGNOSIS — N2581 Secondary hyperparathyroidism of renal origin: Secondary | ICD-10-CM | POA: Diagnosis not present

## 2019-07-14 DIAGNOSIS — D509 Iron deficiency anemia, unspecified: Secondary | ICD-10-CM | POA: Diagnosis not present

## 2019-07-14 DIAGNOSIS — N2581 Secondary hyperparathyroidism of renal origin: Secondary | ICD-10-CM | POA: Diagnosis not present

## 2019-07-14 DIAGNOSIS — Z992 Dependence on renal dialysis: Secondary | ICD-10-CM | POA: Diagnosis not present

## 2019-07-14 DIAGNOSIS — D631 Anemia in chronic kidney disease: Secondary | ICD-10-CM | POA: Diagnosis not present

## 2019-07-14 DIAGNOSIS — N186 End stage renal disease: Secondary | ICD-10-CM | POA: Diagnosis not present

## 2019-07-14 DIAGNOSIS — Z23 Encounter for immunization: Secondary | ICD-10-CM | POA: Diagnosis not present

## 2019-07-17 DIAGNOSIS — D631 Anemia in chronic kidney disease: Secondary | ICD-10-CM | POA: Diagnosis not present

## 2019-07-17 DIAGNOSIS — N186 End stage renal disease: Secondary | ICD-10-CM | POA: Diagnosis not present

## 2019-07-17 DIAGNOSIS — D509 Iron deficiency anemia, unspecified: Secondary | ICD-10-CM | POA: Diagnosis not present

## 2019-07-17 DIAGNOSIS — N2581 Secondary hyperparathyroidism of renal origin: Secondary | ICD-10-CM | POA: Diagnosis not present

## 2019-07-17 DIAGNOSIS — Z23 Encounter for immunization: Secondary | ICD-10-CM | POA: Diagnosis not present

## 2019-07-17 DIAGNOSIS — Z992 Dependence on renal dialysis: Secondary | ICD-10-CM | POA: Diagnosis not present

## 2019-07-19 DIAGNOSIS — D509 Iron deficiency anemia, unspecified: Secondary | ICD-10-CM | POA: Diagnosis not present

## 2019-07-19 DIAGNOSIS — D631 Anemia in chronic kidney disease: Secondary | ICD-10-CM | POA: Diagnosis not present

## 2019-07-19 DIAGNOSIS — Z992 Dependence on renal dialysis: Secondary | ICD-10-CM | POA: Diagnosis not present

## 2019-07-19 DIAGNOSIS — N186 End stage renal disease: Secondary | ICD-10-CM | POA: Diagnosis not present

## 2019-07-19 DIAGNOSIS — Z23 Encounter for immunization: Secondary | ICD-10-CM | POA: Diagnosis not present

## 2019-07-19 DIAGNOSIS — N2581 Secondary hyperparathyroidism of renal origin: Secondary | ICD-10-CM | POA: Diagnosis not present

## 2019-07-21 DIAGNOSIS — N2581 Secondary hyperparathyroidism of renal origin: Secondary | ICD-10-CM | POA: Diagnosis not present

## 2019-07-21 DIAGNOSIS — D509 Iron deficiency anemia, unspecified: Secondary | ICD-10-CM | POA: Diagnosis not present

## 2019-07-21 DIAGNOSIS — N186 End stage renal disease: Secondary | ICD-10-CM | POA: Diagnosis not present

## 2019-07-21 DIAGNOSIS — Z992 Dependence on renal dialysis: Secondary | ICD-10-CM | POA: Diagnosis not present

## 2019-07-21 DIAGNOSIS — Z23 Encounter for immunization: Secondary | ICD-10-CM | POA: Diagnosis not present

## 2019-07-21 DIAGNOSIS — D631 Anemia in chronic kidney disease: Secondary | ICD-10-CM | POA: Diagnosis not present

## 2019-07-24 DIAGNOSIS — Z992 Dependence on renal dialysis: Secondary | ICD-10-CM | POA: Diagnosis not present

## 2019-07-24 DIAGNOSIS — D509 Iron deficiency anemia, unspecified: Secondary | ICD-10-CM | POA: Diagnosis not present

## 2019-07-24 DIAGNOSIS — N186 End stage renal disease: Secondary | ICD-10-CM | POA: Diagnosis not present

## 2019-07-24 DIAGNOSIS — Z23 Encounter for immunization: Secondary | ICD-10-CM | POA: Diagnosis not present

## 2019-07-24 DIAGNOSIS — D631 Anemia in chronic kidney disease: Secondary | ICD-10-CM | POA: Diagnosis not present

## 2019-07-24 DIAGNOSIS — N2581 Secondary hyperparathyroidism of renal origin: Secondary | ICD-10-CM | POA: Diagnosis not present

## 2019-07-26 DIAGNOSIS — Z992 Dependence on renal dialysis: Secondary | ICD-10-CM | POA: Diagnosis not present

## 2019-07-26 DIAGNOSIS — Z23 Encounter for immunization: Secondary | ICD-10-CM | POA: Diagnosis not present

## 2019-07-26 DIAGNOSIS — I158 Other secondary hypertension: Secondary | ICD-10-CM | POA: Diagnosis not present

## 2019-07-26 DIAGNOSIS — N186 End stage renal disease: Secondary | ICD-10-CM | POA: Diagnosis not present

## 2019-07-26 DIAGNOSIS — N2581 Secondary hyperparathyroidism of renal origin: Secondary | ICD-10-CM | POA: Diagnosis not present

## 2019-07-28 DIAGNOSIS — Z992 Dependence on renal dialysis: Secondary | ICD-10-CM | POA: Diagnosis not present

## 2019-07-28 DIAGNOSIS — N2581 Secondary hyperparathyroidism of renal origin: Secondary | ICD-10-CM | POA: Diagnosis not present

## 2019-07-28 DIAGNOSIS — N186 End stage renal disease: Secondary | ICD-10-CM | POA: Diagnosis not present

## 2019-07-28 DIAGNOSIS — Z23 Encounter for immunization: Secondary | ICD-10-CM | POA: Diagnosis not present

## 2019-07-31 DIAGNOSIS — N2581 Secondary hyperparathyroidism of renal origin: Secondary | ICD-10-CM | POA: Diagnosis not present

## 2019-07-31 DIAGNOSIS — N186 End stage renal disease: Secondary | ICD-10-CM | POA: Diagnosis not present

## 2019-07-31 DIAGNOSIS — Z23 Encounter for immunization: Secondary | ICD-10-CM | POA: Diagnosis not present

## 2019-07-31 DIAGNOSIS — Z992 Dependence on renal dialysis: Secondary | ICD-10-CM | POA: Diagnosis not present

## 2019-08-02 DIAGNOSIS — N2581 Secondary hyperparathyroidism of renal origin: Secondary | ICD-10-CM | POA: Diagnosis not present

## 2019-08-02 DIAGNOSIS — N186 End stage renal disease: Secondary | ICD-10-CM | POA: Diagnosis not present

## 2019-08-02 DIAGNOSIS — Z992 Dependence on renal dialysis: Secondary | ICD-10-CM | POA: Diagnosis not present

## 2019-08-02 DIAGNOSIS — Z23 Encounter for immunization: Secondary | ICD-10-CM | POA: Diagnosis not present

## 2019-08-04 DIAGNOSIS — Z23 Encounter for immunization: Secondary | ICD-10-CM | POA: Diagnosis not present

## 2019-08-04 DIAGNOSIS — N186 End stage renal disease: Secondary | ICD-10-CM | POA: Diagnosis not present

## 2019-08-04 DIAGNOSIS — Z992 Dependence on renal dialysis: Secondary | ICD-10-CM | POA: Diagnosis not present

## 2019-08-04 DIAGNOSIS — N2581 Secondary hyperparathyroidism of renal origin: Secondary | ICD-10-CM | POA: Diagnosis not present

## 2019-08-07 DIAGNOSIS — N2581 Secondary hyperparathyroidism of renal origin: Secondary | ICD-10-CM | POA: Diagnosis not present

## 2019-08-07 DIAGNOSIS — Z23 Encounter for immunization: Secondary | ICD-10-CM | POA: Diagnosis not present

## 2019-08-07 DIAGNOSIS — N186 End stage renal disease: Secondary | ICD-10-CM | POA: Diagnosis not present

## 2019-08-07 DIAGNOSIS — Z992 Dependence on renal dialysis: Secondary | ICD-10-CM | POA: Diagnosis not present

## 2019-08-09 DIAGNOSIS — Z992 Dependence on renal dialysis: Secondary | ICD-10-CM | POA: Diagnosis not present

## 2019-08-09 DIAGNOSIS — Z23 Encounter for immunization: Secondary | ICD-10-CM | POA: Diagnosis not present

## 2019-08-09 DIAGNOSIS — N186 End stage renal disease: Secondary | ICD-10-CM | POA: Diagnosis not present

## 2019-08-09 DIAGNOSIS — N2581 Secondary hyperparathyroidism of renal origin: Secondary | ICD-10-CM | POA: Diagnosis not present

## 2019-08-11 DIAGNOSIS — Z23 Encounter for immunization: Secondary | ICD-10-CM | POA: Diagnosis not present

## 2019-08-11 DIAGNOSIS — Z992 Dependence on renal dialysis: Secondary | ICD-10-CM | POA: Diagnosis not present

## 2019-08-11 DIAGNOSIS — N2581 Secondary hyperparathyroidism of renal origin: Secondary | ICD-10-CM | POA: Diagnosis not present

## 2019-08-11 DIAGNOSIS — N186 End stage renal disease: Secondary | ICD-10-CM | POA: Diagnosis not present

## 2019-08-14 DIAGNOSIS — N186 End stage renal disease: Secondary | ICD-10-CM | POA: Diagnosis not present

## 2019-08-14 DIAGNOSIS — N2581 Secondary hyperparathyroidism of renal origin: Secondary | ICD-10-CM | POA: Diagnosis not present

## 2019-08-14 DIAGNOSIS — Z992 Dependence on renal dialysis: Secondary | ICD-10-CM | POA: Diagnosis not present

## 2019-08-14 DIAGNOSIS — Z23 Encounter for immunization: Secondary | ICD-10-CM | POA: Diagnosis not present

## 2019-08-16 DIAGNOSIS — N2581 Secondary hyperparathyroidism of renal origin: Secondary | ICD-10-CM | POA: Diagnosis not present

## 2019-08-16 DIAGNOSIS — Z23 Encounter for immunization: Secondary | ICD-10-CM | POA: Diagnosis not present

## 2019-08-16 DIAGNOSIS — Z992 Dependence on renal dialysis: Secondary | ICD-10-CM | POA: Diagnosis not present

## 2019-08-16 DIAGNOSIS — N186 End stage renal disease: Secondary | ICD-10-CM | POA: Diagnosis not present

## 2019-08-18 DIAGNOSIS — Z23 Encounter for immunization: Secondary | ICD-10-CM | POA: Diagnosis not present

## 2019-08-18 DIAGNOSIS — Z992 Dependence on renal dialysis: Secondary | ICD-10-CM | POA: Diagnosis not present

## 2019-08-18 DIAGNOSIS — N186 End stage renal disease: Secondary | ICD-10-CM | POA: Diagnosis not present

## 2019-08-18 DIAGNOSIS — N2581 Secondary hyperparathyroidism of renal origin: Secondary | ICD-10-CM | POA: Diagnosis not present

## 2019-08-21 DIAGNOSIS — N186 End stage renal disease: Secondary | ICD-10-CM | POA: Diagnosis not present

## 2019-08-21 DIAGNOSIS — N2581 Secondary hyperparathyroidism of renal origin: Secondary | ICD-10-CM | POA: Diagnosis not present

## 2019-08-21 DIAGNOSIS — Z23 Encounter for immunization: Secondary | ICD-10-CM | POA: Diagnosis not present

## 2019-08-21 DIAGNOSIS — Z992 Dependence on renal dialysis: Secondary | ICD-10-CM | POA: Diagnosis not present

## 2019-08-22 ENCOUNTER — Other Ambulatory Visit: Payer: Self-pay

## 2019-08-22 ENCOUNTER — Encounter: Payer: Self-pay | Admitting: Family Medicine

## 2019-08-22 ENCOUNTER — Ambulatory Visit (INDEPENDENT_AMBULATORY_CARE_PROVIDER_SITE_OTHER): Payer: Medicare Other | Admitting: Family Medicine

## 2019-08-22 DIAGNOSIS — R079 Chest pain, unspecified: Secondary | ICD-10-CM | POA: Diagnosis not present

## 2019-08-22 NOTE — Patient Instructions (Signed)
It was great seeing you today!  Your pain is consistent with pain of one of your ribs.  Oftentimes we can injure these without really realizing it.  You may have bumped into something or slept the wrong way, there is a lot of ways you can injure this.  It can sometimes take 6 to 8 weeks to really get better.  You can take up to 650 mg of Tylenol every 6 hours for the pain.  I also recommend getting some Voltaren gel, I gave you a handout for this.  I think your back pain sounds most consistent with a spinal muscle strain.  You can again take the same dose of Tylenol and the Voltaren gel for this as well.  If I have your problems do not improve, please let us know.  Lastly your A1c was 4.7 last visit, and your thyroid hormone looked good as well.

## 2019-08-23 DIAGNOSIS — Z23 Encounter for immunization: Secondary | ICD-10-CM | POA: Diagnosis not present

## 2019-08-23 DIAGNOSIS — Z992 Dependence on renal dialysis: Secondary | ICD-10-CM | POA: Diagnosis not present

## 2019-08-23 DIAGNOSIS — N2581 Secondary hyperparathyroidism of renal origin: Secondary | ICD-10-CM | POA: Diagnosis not present

## 2019-08-23 DIAGNOSIS — N186 End stage renal disease: Secondary | ICD-10-CM | POA: Diagnosis not present

## 2019-08-23 DIAGNOSIS — R079 Chest pain, unspecified: Secondary | ICD-10-CM | POA: Insufficient documentation

## 2019-08-23 DIAGNOSIS — R0781 Pleurodynia: Secondary | ICD-10-CM | POA: Insufficient documentation

## 2019-08-23 NOTE — Assessment & Plan Note (Addendum)
The patient's chest pain is likely due to a rib injury.  The patient does not recall any traumatic insult to the area, and she is unsure if had poor sleep posture the night prior to this starting.  Considered other etiologies of chest pain including but not limited to CAD, PE, or pleurisy but the rest of her symptomatology, history, and exam are felt to make these very unlikely.  Can try Tylenol 325 mg every 4 hours, and apply Voltaren gel as needed.  I did give return precautions, if her symptoms change, symptoms worsen, or fail to improve over the next couple of weeks.  That explained that oftentimes rib injuries can take 6 weeks or so to fully heal.

## 2019-08-23 NOTE — Assessment & Plan Note (Signed)
>>  ASSESSMENT AND PLAN FOR LEFT-SIDED CHEST PAIN WRITTEN ON 08/23/2019  4:43 PM BY Myrene Buddy, MD  The patient's chest pain is likely due to a rib injury.  The patient does not recall any traumatic insult to the area, and she is unsure if had poor sleep posture the night prior to this starting.  Considered other etiologies of chest pain including but not limited to CAD, PE, or pleurisy but the rest of her symptomatology, history, and exam are felt to make these very unlikely.  Can try Tylenol 325 mg every 4 hours, and apply Voltaren gel as needed.  I did give return precautions, if her symptoms change, symptoms worsen, or fail to improve over the next couple of weeks.  That explained that oftentimes rib injuries can take 6 weeks or so to fully heal.

## 2019-08-23 NOTE — Progress Notes (Signed)
   CHIEF COMPLAINT / HPI: 34 year old female who presents with left-sided inframammary pain.  History is obtained from both patient and her mother who accompanied her.  Per her report she has been having intermittent left anterior chest pain, located 3 to 4 cm below her breast.  Has been intermittent, and is described as an aching pain.  She woke up with it 1 day, does not know of any traumatic injury to the area.  She has had no other chest pain, shortness of breath, dyspnea on exertion, headaches, or any other symptoms to accompany this pain.  Patient is also interested in getting a left breast exam as she believes that she has felt a nodule, which may be related to the pain.  The pain has improved somewhat since it started.  PERTINENT  PMH / PSH: ESRD secondary to FSGS   OBJECTIVE: BP 105/80   Pulse 91   Ht 5\' 4"  (1.626 m)   Wt 148 lb 9.6 oz (67.4 kg)   LMP 07/26/2019 (Approximate)   SpO2 100%   BMI 25.51 kg/m   Gen: 34 year old African-American female, no acute distress, very pleasant CV: Regular rate rhythm, no M/R/G Resp: Respirations nonlabored, no distress Left breast: No abnormalities in any quadrants of the left breast.  No nodules, or abnormal tissues palpated. Left chest: Mild point tenderness to palpation over anterior rib 3 to 4 cm below patient's breast.  Correlates well with where patient believes she had felt the nodule. Neuro: Alert and oriented, Speech clear, No gross deficits   ASSESSMENT / PLAN:  Left-sided chest pain The patient's chest pain is likely due to a rib injury.  The patient does not recall any traumatic insult to the area, and she is unsure if had poor sleep posture the night prior to this starting.  Considered other etiologies of chest pain including but not limited to CAD, PE, or pleurisy but the rest of her symptomatology, history, and exam are felt to make these very unlikely.  Can try Tylenol 325 mg every 4 hours, and apply Voltaren gel as needed.  I  did give return precautions, if her symptoms change, symptoms worsen, or fail to improve over the next couple of weeks.  That explained that oftentimes rib injuries can take 6 weeks or so to fully heal.   Guadalupe Dawn MD PGY-3 Family Medicine Resident [place]

## 2019-08-25 DIAGNOSIS — I158 Other secondary hypertension: Secondary | ICD-10-CM | POA: Diagnosis not present

## 2019-08-25 DIAGNOSIS — N186 End stage renal disease: Secondary | ICD-10-CM | POA: Diagnosis not present

## 2019-08-25 DIAGNOSIS — N2581 Secondary hyperparathyroidism of renal origin: Secondary | ICD-10-CM | POA: Diagnosis not present

## 2019-08-25 DIAGNOSIS — Z992 Dependence on renal dialysis: Secondary | ICD-10-CM | POA: Diagnosis not present

## 2019-08-28 DIAGNOSIS — Z992 Dependence on renal dialysis: Secondary | ICD-10-CM | POA: Diagnosis not present

## 2019-08-28 DIAGNOSIS — N2581 Secondary hyperparathyroidism of renal origin: Secondary | ICD-10-CM | POA: Diagnosis not present

## 2019-08-28 DIAGNOSIS — N186 End stage renal disease: Secondary | ICD-10-CM | POA: Diagnosis not present

## 2019-08-30 ENCOUNTER — Other Ambulatory Visit: Payer: Self-pay | Admitting: Family Medicine

## 2019-08-30 DIAGNOSIS — N2581 Secondary hyperparathyroidism of renal origin: Secondary | ICD-10-CM | POA: Diagnosis not present

## 2019-08-30 DIAGNOSIS — R112 Nausea with vomiting, unspecified: Secondary | ICD-10-CM

## 2019-08-30 DIAGNOSIS — N186 End stage renal disease: Secondary | ICD-10-CM | POA: Diagnosis not present

## 2019-08-30 DIAGNOSIS — Z992 Dependence on renal dialysis: Secondary | ICD-10-CM | POA: Diagnosis not present

## 2019-09-01 DIAGNOSIS — Z992 Dependence on renal dialysis: Secondary | ICD-10-CM | POA: Diagnosis not present

## 2019-09-01 DIAGNOSIS — N186 End stage renal disease: Secondary | ICD-10-CM | POA: Diagnosis not present

## 2019-09-01 DIAGNOSIS — N2581 Secondary hyperparathyroidism of renal origin: Secondary | ICD-10-CM | POA: Diagnosis not present

## 2019-09-04 DIAGNOSIS — N186 End stage renal disease: Secondary | ICD-10-CM | POA: Diagnosis not present

## 2019-09-04 DIAGNOSIS — Z992 Dependence on renal dialysis: Secondary | ICD-10-CM | POA: Diagnosis not present

## 2019-09-04 DIAGNOSIS — N2581 Secondary hyperparathyroidism of renal origin: Secondary | ICD-10-CM | POA: Diagnosis not present

## 2019-09-06 DIAGNOSIS — Z992 Dependence on renal dialysis: Secondary | ICD-10-CM | POA: Diagnosis not present

## 2019-09-06 DIAGNOSIS — N2581 Secondary hyperparathyroidism of renal origin: Secondary | ICD-10-CM | POA: Diagnosis not present

## 2019-09-06 DIAGNOSIS — N186 End stage renal disease: Secondary | ICD-10-CM | POA: Diagnosis not present

## 2019-09-08 DIAGNOSIS — N2581 Secondary hyperparathyroidism of renal origin: Secondary | ICD-10-CM | POA: Diagnosis not present

## 2019-09-08 DIAGNOSIS — Z992 Dependence on renal dialysis: Secondary | ICD-10-CM | POA: Diagnosis not present

## 2019-09-08 DIAGNOSIS — N186 End stage renal disease: Secondary | ICD-10-CM | POA: Diagnosis not present

## 2019-09-11 DIAGNOSIS — Z992 Dependence on renal dialysis: Secondary | ICD-10-CM | POA: Diagnosis not present

## 2019-09-11 DIAGNOSIS — N2581 Secondary hyperparathyroidism of renal origin: Secondary | ICD-10-CM | POA: Diagnosis not present

## 2019-09-11 DIAGNOSIS — N186 End stage renal disease: Secondary | ICD-10-CM | POA: Diagnosis not present

## 2019-09-13 DIAGNOSIS — Z992 Dependence on renal dialysis: Secondary | ICD-10-CM | POA: Diagnosis not present

## 2019-09-13 DIAGNOSIS — N2581 Secondary hyperparathyroidism of renal origin: Secondary | ICD-10-CM | POA: Diagnosis not present

## 2019-09-13 DIAGNOSIS — N186 End stage renal disease: Secondary | ICD-10-CM | POA: Diagnosis not present

## 2019-09-15 DIAGNOSIS — N2581 Secondary hyperparathyroidism of renal origin: Secondary | ICD-10-CM | POA: Diagnosis not present

## 2019-09-15 DIAGNOSIS — N186 End stage renal disease: Secondary | ICD-10-CM | POA: Diagnosis not present

## 2019-09-15 DIAGNOSIS — Z992 Dependence on renal dialysis: Secondary | ICD-10-CM | POA: Diagnosis not present

## 2019-09-18 DIAGNOSIS — N186 End stage renal disease: Secondary | ICD-10-CM | POA: Diagnosis not present

## 2019-09-18 DIAGNOSIS — N2581 Secondary hyperparathyroidism of renal origin: Secondary | ICD-10-CM | POA: Diagnosis not present

## 2019-09-18 DIAGNOSIS — Z992 Dependence on renal dialysis: Secondary | ICD-10-CM | POA: Diagnosis not present

## 2019-09-20 DIAGNOSIS — N2581 Secondary hyperparathyroidism of renal origin: Secondary | ICD-10-CM | POA: Diagnosis not present

## 2019-09-20 DIAGNOSIS — Z992 Dependence on renal dialysis: Secondary | ICD-10-CM | POA: Diagnosis not present

## 2019-09-20 DIAGNOSIS — N186 End stage renal disease: Secondary | ICD-10-CM | POA: Diagnosis not present

## 2019-09-21 DIAGNOSIS — F71 Moderate intellectual disabilities: Secondary | ICD-10-CM | POA: Diagnosis not present

## 2019-09-21 DIAGNOSIS — F319 Bipolar disorder, unspecified: Secondary | ICD-10-CM | POA: Diagnosis not present

## 2019-09-21 DIAGNOSIS — F329 Major depressive disorder, single episode, unspecified: Secondary | ICD-10-CM | POA: Diagnosis not present

## 2019-09-21 DIAGNOSIS — F422 Mixed obsessional thoughts and acts: Secondary | ICD-10-CM | POA: Diagnosis not present

## 2019-09-22 DIAGNOSIS — N2581 Secondary hyperparathyroidism of renal origin: Secondary | ICD-10-CM | POA: Diagnosis not present

## 2019-09-22 DIAGNOSIS — N186 End stage renal disease: Secondary | ICD-10-CM | POA: Diagnosis not present

## 2019-09-22 DIAGNOSIS — Z992 Dependence on renal dialysis: Secondary | ICD-10-CM | POA: Diagnosis not present

## 2019-10-25 DIAGNOSIS — N186 End stage renal disease: Secondary | ICD-10-CM | POA: Diagnosis not present

## 2019-10-25 DIAGNOSIS — D631 Anemia in chronic kidney disease: Secondary | ICD-10-CM | POA: Diagnosis not present

## 2019-10-25 DIAGNOSIS — D509 Iron deficiency anemia, unspecified: Secondary | ICD-10-CM | POA: Diagnosis not present

## 2019-10-25 DIAGNOSIS — I158 Other secondary hypertension: Secondary | ICD-10-CM | POA: Diagnosis not present

## 2019-10-25 DIAGNOSIS — Z992 Dependence on renal dialysis: Secondary | ICD-10-CM | POA: Diagnosis not present

## 2019-10-25 DIAGNOSIS — N2581 Secondary hyperparathyroidism of renal origin: Secondary | ICD-10-CM | POA: Diagnosis not present

## 2019-10-27 DIAGNOSIS — N186 End stage renal disease: Secondary | ICD-10-CM | POA: Diagnosis not present

## 2019-10-27 DIAGNOSIS — D631 Anemia in chronic kidney disease: Secondary | ICD-10-CM | POA: Diagnosis not present

## 2019-10-27 DIAGNOSIS — D509 Iron deficiency anemia, unspecified: Secondary | ICD-10-CM | POA: Diagnosis not present

## 2019-10-27 DIAGNOSIS — Z992 Dependence on renal dialysis: Secondary | ICD-10-CM | POA: Diagnosis not present

## 2019-10-27 DIAGNOSIS — N2581 Secondary hyperparathyroidism of renal origin: Secondary | ICD-10-CM | POA: Diagnosis not present

## 2019-10-30 DIAGNOSIS — D509 Iron deficiency anemia, unspecified: Secondary | ICD-10-CM | POA: Diagnosis not present

## 2019-10-30 DIAGNOSIS — N2581 Secondary hyperparathyroidism of renal origin: Secondary | ICD-10-CM | POA: Diagnosis not present

## 2019-10-30 DIAGNOSIS — D631 Anemia in chronic kidney disease: Secondary | ICD-10-CM | POA: Diagnosis not present

## 2019-10-30 DIAGNOSIS — Z992 Dependence on renal dialysis: Secondary | ICD-10-CM | POA: Diagnosis not present

## 2019-10-30 DIAGNOSIS — N186 End stage renal disease: Secondary | ICD-10-CM | POA: Diagnosis not present

## 2019-10-31 ENCOUNTER — Other Ambulatory Visit: Payer: Self-pay | Admitting: Family Medicine

## 2019-10-31 DIAGNOSIS — R112 Nausea with vomiting, unspecified: Secondary | ICD-10-CM

## 2019-11-01 DIAGNOSIS — N186 End stage renal disease: Secondary | ICD-10-CM | POA: Diagnosis not present

## 2019-11-01 DIAGNOSIS — D631 Anemia in chronic kidney disease: Secondary | ICD-10-CM | POA: Diagnosis not present

## 2019-11-01 DIAGNOSIS — D509 Iron deficiency anemia, unspecified: Secondary | ICD-10-CM | POA: Diagnosis not present

## 2019-11-01 DIAGNOSIS — N2581 Secondary hyperparathyroidism of renal origin: Secondary | ICD-10-CM | POA: Diagnosis not present

## 2019-11-01 DIAGNOSIS — Z992 Dependence on renal dialysis: Secondary | ICD-10-CM | POA: Diagnosis not present

## 2019-11-03 DIAGNOSIS — D509 Iron deficiency anemia, unspecified: Secondary | ICD-10-CM | POA: Diagnosis not present

## 2019-11-03 DIAGNOSIS — N2581 Secondary hyperparathyroidism of renal origin: Secondary | ICD-10-CM | POA: Diagnosis not present

## 2019-11-03 DIAGNOSIS — Z992 Dependence on renal dialysis: Secondary | ICD-10-CM | POA: Diagnosis not present

## 2019-11-03 DIAGNOSIS — D631 Anemia in chronic kidney disease: Secondary | ICD-10-CM | POA: Diagnosis not present

## 2019-11-03 DIAGNOSIS — N186 End stage renal disease: Secondary | ICD-10-CM | POA: Diagnosis not present

## 2019-11-06 DIAGNOSIS — Z992 Dependence on renal dialysis: Secondary | ICD-10-CM | POA: Diagnosis not present

## 2019-11-06 DIAGNOSIS — N186 End stage renal disease: Secondary | ICD-10-CM | POA: Diagnosis not present

## 2019-11-06 DIAGNOSIS — D631 Anemia in chronic kidney disease: Secondary | ICD-10-CM | POA: Diagnosis not present

## 2019-11-06 DIAGNOSIS — D509 Iron deficiency anemia, unspecified: Secondary | ICD-10-CM | POA: Diagnosis not present

## 2019-11-06 DIAGNOSIS — N2581 Secondary hyperparathyroidism of renal origin: Secondary | ICD-10-CM | POA: Diagnosis not present

## 2019-11-07 ENCOUNTER — Other Ambulatory Visit: Payer: Self-pay

## 2019-11-07 DIAGNOSIS — E039 Hypothyroidism, unspecified: Secondary | ICD-10-CM

## 2019-11-07 MED ORDER — LEVOTHYROXINE SODIUM 112 MCG PO TABS: ORAL_TABLET | ORAL | 3 refills | Status: DC

## 2019-11-08 DIAGNOSIS — N2581 Secondary hyperparathyroidism of renal origin: Secondary | ICD-10-CM | POA: Diagnosis not present

## 2019-11-08 DIAGNOSIS — D631 Anemia in chronic kidney disease: Secondary | ICD-10-CM | POA: Diagnosis not present

## 2019-11-08 DIAGNOSIS — Z992 Dependence on renal dialysis: Secondary | ICD-10-CM | POA: Diagnosis not present

## 2019-11-08 DIAGNOSIS — N186 End stage renal disease: Secondary | ICD-10-CM | POA: Diagnosis not present

## 2019-11-08 DIAGNOSIS — D509 Iron deficiency anemia, unspecified: Secondary | ICD-10-CM | POA: Diagnosis not present

## 2019-11-10 DIAGNOSIS — N2581 Secondary hyperparathyroidism of renal origin: Secondary | ICD-10-CM | POA: Diagnosis not present

## 2019-11-10 DIAGNOSIS — D631 Anemia in chronic kidney disease: Secondary | ICD-10-CM | POA: Diagnosis not present

## 2019-11-10 DIAGNOSIS — D509 Iron deficiency anemia, unspecified: Secondary | ICD-10-CM | POA: Diagnosis not present

## 2019-11-10 DIAGNOSIS — N186 End stage renal disease: Secondary | ICD-10-CM | POA: Diagnosis not present

## 2019-11-10 DIAGNOSIS — Z992 Dependence on renal dialysis: Secondary | ICD-10-CM | POA: Diagnosis not present

## 2019-11-13 DIAGNOSIS — D631 Anemia in chronic kidney disease: Secondary | ICD-10-CM | POA: Diagnosis not present

## 2019-11-13 DIAGNOSIS — Z992 Dependence on renal dialysis: Secondary | ICD-10-CM | POA: Diagnosis not present

## 2019-11-13 DIAGNOSIS — N2581 Secondary hyperparathyroidism of renal origin: Secondary | ICD-10-CM | POA: Diagnosis not present

## 2019-11-13 DIAGNOSIS — D509 Iron deficiency anemia, unspecified: Secondary | ICD-10-CM | POA: Diagnosis not present

## 2019-11-13 DIAGNOSIS — N186 End stage renal disease: Secondary | ICD-10-CM | POA: Diagnosis not present

## 2019-11-14 DIAGNOSIS — F319 Bipolar disorder, unspecified: Secondary | ICD-10-CM | POA: Diagnosis not present

## 2019-11-14 DIAGNOSIS — F71 Moderate intellectual disabilities: Secondary | ICD-10-CM | POA: Diagnosis not present

## 2019-11-14 DIAGNOSIS — F329 Major depressive disorder, single episode, unspecified: Secondary | ICD-10-CM | POA: Diagnosis not present

## 2019-11-15 DIAGNOSIS — Z992 Dependence on renal dialysis: Secondary | ICD-10-CM | POA: Diagnosis not present

## 2019-11-15 DIAGNOSIS — D631 Anemia in chronic kidney disease: Secondary | ICD-10-CM | POA: Diagnosis not present

## 2019-11-15 DIAGNOSIS — D509 Iron deficiency anemia, unspecified: Secondary | ICD-10-CM | POA: Diagnosis not present

## 2019-11-15 DIAGNOSIS — N186 End stage renal disease: Secondary | ICD-10-CM | POA: Diagnosis not present

## 2019-11-15 DIAGNOSIS — N2581 Secondary hyperparathyroidism of renal origin: Secondary | ICD-10-CM | POA: Diagnosis not present

## 2019-11-17 DIAGNOSIS — N2581 Secondary hyperparathyroidism of renal origin: Secondary | ICD-10-CM | POA: Diagnosis not present

## 2019-11-17 DIAGNOSIS — D509 Iron deficiency anemia, unspecified: Secondary | ICD-10-CM | POA: Diagnosis not present

## 2019-11-17 DIAGNOSIS — Z992 Dependence on renal dialysis: Secondary | ICD-10-CM | POA: Diagnosis not present

## 2019-11-17 DIAGNOSIS — N186 End stage renal disease: Secondary | ICD-10-CM | POA: Diagnosis not present

## 2019-11-17 DIAGNOSIS — D631 Anemia in chronic kidney disease: Secondary | ICD-10-CM | POA: Diagnosis not present

## 2019-11-20 DIAGNOSIS — Z992 Dependence on renal dialysis: Secondary | ICD-10-CM | POA: Diagnosis not present

## 2019-11-20 DIAGNOSIS — D509 Iron deficiency anemia, unspecified: Secondary | ICD-10-CM | POA: Diagnosis not present

## 2019-11-20 DIAGNOSIS — D631 Anemia in chronic kidney disease: Secondary | ICD-10-CM | POA: Diagnosis not present

## 2019-11-20 DIAGNOSIS — N2581 Secondary hyperparathyroidism of renal origin: Secondary | ICD-10-CM | POA: Diagnosis not present

## 2019-11-20 DIAGNOSIS — N186 End stage renal disease: Secondary | ICD-10-CM | POA: Diagnosis not present

## 2019-11-22 DIAGNOSIS — D509 Iron deficiency anemia, unspecified: Secondary | ICD-10-CM | POA: Diagnosis not present

## 2019-11-22 DIAGNOSIS — N186 End stage renal disease: Secondary | ICD-10-CM | POA: Diagnosis not present

## 2019-11-22 DIAGNOSIS — Z992 Dependence on renal dialysis: Secondary | ICD-10-CM | POA: Diagnosis not present

## 2019-11-22 DIAGNOSIS — D631 Anemia in chronic kidney disease: Secondary | ICD-10-CM | POA: Diagnosis not present

## 2019-11-22 DIAGNOSIS — N2581 Secondary hyperparathyroidism of renal origin: Secondary | ICD-10-CM | POA: Diagnosis not present

## 2019-11-24 DIAGNOSIS — D631 Anemia in chronic kidney disease: Secondary | ICD-10-CM | POA: Diagnosis not present

## 2019-11-24 DIAGNOSIS — Z992 Dependence on renal dialysis: Secondary | ICD-10-CM | POA: Diagnosis not present

## 2019-11-24 DIAGNOSIS — N2581 Secondary hyperparathyroidism of renal origin: Secondary | ICD-10-CM | POA: Diagnosis not present

## 2019-11-24 DIAGNOSIS — D509 Iron deficiency anemia, unspecified: Secondary | ICD-10-CM | POA: Diagnosis not present

## 2019-11-24 DIAGNOSIS — N186 End stage renal disease: Secondary | ICD-10-CM | POA: Diagnosis not present

## 2019-11-25 DIAGNOSIS — I158 Other secondary hypertension: Secondary | ICD-10-CM | POA: Diagnosis not present

## 2019-11-25 DIAGNOSIS — Z992 Dependence on renal dialysis: Secondary | ICD-10-CM | POA: Diagnosis not present

## 2019-11-25 DIAGNOSIS — N186 End stage renal disease: Secondary | ICD-10-CM | POA: Diagnosis not present

## 2019-11-25 HISTORY — PX: KIDNEY TRANSPLANT: SHX239

## 2019-11-27 DIAGNOSIS — D509 Iron deficiency anemia, unspecified: Secondary | ICD-10-CM | POA: Diagnosis not present

## 2019-11-27 DIAGNOSIS — N2581 Secondary hyperparathyroidism of renal origin: Secondary | ICD-10-CM | POA: Diagnosis not present

## 2019-11-27 DIAGNOSIS — D631 Anemia in chronic kidney disease: Secondary | ICD-10-CM | POA: Diagnosis not present

## 2019-11-27 DIAGNOSIS — Z992 Dependence on renal dialysis: Secondary | ICD-10-CM | POA: Diagnosis not present

## 2019-11-27 DIAGNOSIS — N186 End stage renal disease: Secondary | ICD-10-CM | POA: Diagnosis not present

## 2019-11-29 DIAGNOSIS — R34 Anuria and oliguria: Secondary | ICD-10-CM | POA: Diagnosis not present

## 2019-11-29 DIAGNOSIS — E875 Hyperkalemia: Secondary | ICD-10-CM | POA: Diagnosis not present

## 2019-11-29 DIAGNOSIS — N269 Renal sclerosis, unspecified: Secondary | ICD-10-CM | POA: Diagnosis not present

## 2019-11-29 DIAGNOSIS — N170 Acute kidney failure with tubular necrosis: Secondary | ICD-10-CM | POA: Diagnosis not present

## 2019-11-29 DIAGNOSIS — E871 Hypo-osmolality and hyponatremia: Secondary | ICD-10-CM | POA: Diagnosis not present

## 2019-11-29 DIAGNOSIS — Z01818 Encounter for other preprocedural examination: Secondary | ICD-10-CM | POA: Diagnosis not present

## 2019-11-29 DIAGNOSIS — N041 Nephrotic syndrome with focal and segmental glomerular lesions: Secondary | ICD-10-CM | POA: Diagnosis not present

## 2019-11-29 DIAGNOSIS — Z940 Kidney transplant status: Secondary | ICD-10-CM | POA: Diagnosis not present

## 2019-11-29 DIAGNOSIS — T8619 Other complication of kidney transplant: Secondary | ICD-10-CM | POA: Diagnosis not present

## 2019-11-29 DIAGNOSIS — N186 End stage renal disease: Secondary | ICD-10-CM | POA: Diagnosis present

## 2019-11-29 DIAGNOSIS — F39 Unspecified mood [affective] disorder: Secondary | ICD-10-CM | POA: Diagnosis present

## 2019-11-29 DIAGNOSIS — R000 Tachycardia, unspecified: Secondary | ICD-10-CM | POA: Diagnosis present

## 2019-11-29 DIAGNOSIS — D509 Iron deficiency anemia, unspecified: Secondary | ICD-10-CM | POA: Diagnosis not present

## 2019-11-29 DIAGNOSIS — Z792 Long term (current) use of antibiotics: Secondary | ICD-10-CM | POA: Diagnosis not present

## 2019-11-29 DIAGNOSIS — I120 Hypertensive chronic kidney disease with stage 5 chronic kidney disease or end stage renal disease: Secondary | ICD-10-CM | POA: Diagnosis not present

## 2019-11-29 DIAGNOSIS — G8918 Other acute postprocedural pain: Secondary | ICD-10-CM | POA: Diagnosis not present

## 2019-11-29 DIAGNOSIS — Z4822 Encounter for aftercare following kidney transplant: Secondary | ICD-10-CM | POA: Diagnosis not present

## 2019-11-29 DIAGNOSIS — Z20822 Contact with and (suspected) exposure to covid-19: Secondary | ICD-10-CM | POA: Diagnosis present

## 2019-11-29 DIAGNOSIS — N2581 Secondary hyperparathyroidism of renal origin: Secondary | ICD-10-CM | POA: Diagnosis not present

## 2019-11-29 DIAGNOSIS — D849 Immunodeficiency, unspecified: Secondary | ICD-10-CM | POA: Diagnosis not present

## 2019-11-29 DIAGNOSIS — I10 Essential (primary) hypertension: Secondary | ICD-10-CM | POA: Diagnosis present

## 2019-11-29 DIAGNOSIS — F79 Unspecified intellectual disabilities: Secondary | ICD-10-CM | POA: Diagnosis present

## 2019-11-29 DIAGNOSIS — R918 Other nonspecific abnormal finding of lung field: Secondary | ICD-10-CM | POA: Diagnosis not present

## 2019-11-29 DIAGNOSIS — F840 Autistic disorder: Secondary | ICD-10-CM | POA: Diagnosis present

## 2019-11-29 DIAGNOSIS — E039 Hypothyroidism, unspecified: Secondary | ICD-10-CM | POA: Diagnosis present

## 2019-11-29 DIAGNOSIS — D631 Anemia in chronic kidney disease: Secondary | ICD-10-CM | POA: Diagnosis present

## 2019-11-29 DIAGNOSIS — F411 Generalized anxiety disorder: Secondary | ICD-10-CM | POA: Diagnosis present

## 2019-11-29 DIAGNOSIS — Z992 Dependence on renal dialysis: Secondary | ICD-10-CM | POA: Diagnosis not present

## 2019-11-29 DIAGNOSIS — J9811 Atelectasis: Secondary | ICD-10-CM | POA: Diagnosis not present

## 2019-11-29 DIAGNOSIS — K219 Gastro-esophageal reflux disease without esophagitis: Secondary | ICD-10-CM | POA: Diagnosis present

## 2019-11-29 DIAGNOSIS — N3289 Other specified disorders of bladder: Secondary | ICD-10-CM | POA: Diagnosis present

## 2019-12-03 DIAGNOSIS — D849 Immunodeficiency, unspecified: Secondary | ICD-10-CM | POA: Insufficient documentation

## 2019-12-03 DIAGNOSIS — Z940 Kidney transplant status: Secondary | ICD-10-CM | POA: Insufficient documentation

## 2019-12-05 DIAGNOSIS — Z4822 Encounter for aftercare following kidney transplant: Secondary | ICD-10-CM | POA: Diagnosis not present

## 2019-12-05 DIAGNOSIS — I10 Essential (primary) hypertension: Secondary | ICD-10-CM | POA: Diagnosis not present

## 2019-12-05 DIAGNOSIS — E876 Hypokalemia: Secondary | ICD-10-CM | POA: Diagnosis not present

## 2019-12-05 DIAGNOSIS — Z792 Long term (current) use of antibiotics: Secondary | ICD-10-CM | POA: Diagnosis not present

## 2019-12-05 DIAGNOSIS — D849 Immunodeficiency, unspecified: Secondary | ICD-10-CM | POA: Diagnosis not present

## 2019-12-05 DIAGNOSIS — Z7952 Long term (current) use of systemic steroids: Secondary | ICD-10-CM | POA: Diagnosis not present

## 2019-12-05 DIAGNOSIS — T8619 Other complication of kidney transplant: Secondary | ICD-10-CM | POA: Diagnosis not present

## 2019-12-05 DIAGNOSIS — D631 Anemia in chronic kidney disease: Secondary | ICD-10-CM | POA: Diagnosis not present

## 2019-12-05 DIAGNOSIS — Z940 Kidney transplant status: Secondary | ICD-10-CM | POA: Diagnosis not present

## 2019-12-05 DIAGNOSIS — D649 Anemia, unspecified: Secondary | ICD-10-CM | POA: Diagnosis not present

## 2019-12-05 DIAGNOSIS — I120 Hypertensive chronic kidney disease with stage 5 chronic kidney disease or end stage renal disease: Secondary | ICD-10-CM | POA: Diagnosis not present

## 2019-12-05 DIAGNOSIS — N186 End stage renal disease: Secondary | ICD-10-CM | POA: Diagnosis not present

## 2019-12-07 DIAGNOSIS — Z940 Kidney transplant status: Secondary | ICD-10-CM | POA: Diagnosis not present

## 2019-12-07 DIAGNOSIS — R918 Other nonspecific abnormal finding of lung field: Secondary | ICD-10-CM | POA: Diagnosis not present

## 2019-12-07 DIAGNOSIS — R911 Solitary pulmonary nodule: Secondary | ICD-10-CM | POA: Diagnosis not present

## 2019-12-07 DIAGNOSIS — K219 Gastro-esophageal reflux disease without esophagitis: Secondary | ICD-10-CM | POA: Diagnosis not present

## 2019-12-07 DIAGNOSIS — Z5181 Encounter for therapeutic drug level monitoring: Secondary | ICD-10-CM | POA: Diagnosis not present

## 2019-12-07 DIAGNOSIS — D849 Immunodeficiency, unspecified: Secondary | ICD-10-CM | POA: Diagnosis not present

## 2019-12-07 DIAGNOSIS — E039 Hypothyroidism, unspecified: Secondary | ICD-10-CM | POA: Diagnosis not present

## 2019-12-07 DIAGNOSIS — Z4822 Encounter for aftercare following kidney transplant: Secondary | ICD-10-CM | POA: Diagnosis not present

## 2019-12-07 DIAGNOSIS — E876 Hypokalemia: Secondary | ICD-10-CM | POA: Diagnosis not present

## 2019-12-07 DIAGNOSIS — T8619 Other complication of kidney transplant: Secondary | ICD-10-CM | POA: Diagnosis not present

## 2019-12-07 DIAGNOSIS — M25551 Pain in right hip: Secondary | ICD-10-CM | POA: Diagnosis not present

## 2019-12-07 DIAGNOSIS — D631 Anemia in chronic kidney disease: Secondary | ICD-10-CM | POA: Diagnosis not present

## 2019-12-07 DIAGNOSIS — N281 Cyst of kidney, acquired: Secondary | ICD-10-CM | POA: Diagnosis not present

## 2019-12-07 DIAGNOSIS — I129 Hypertensive chronic kidney disease with stage 1 through stage 4 chronic kidney disease, or unspecified chronic kidney disease: Secondary | ICD-10-CM | POA: Diagnosis not present

## 2019-12-07 DIAGNOSIS — F840 Autistic disorder: Secondary | ICD-10-CM | POA: Diagnosis not present

## 2019-12-07 DIAGNOSIS — F79 Unspecified intellectual disabilities: Secondary | ICD-10-CM | POA: Diagnosis not present

## 2019-12-07 DIAGNOSIS — Z79899 Other long term (current) drug therapy: Secondary | ICD-10-CM | POA: Diagnosis not present

## 2019-12-07 DIAGNOSIS — N182 Chronic kidney disease, stage 2 (mild): Secondary | ICD-10-CM | POA: Diagnosis not present

## 2019-12-07 DIAGNOSIS — E871 Hypo-osmolality and hyponatremia: Secondary | ICD-10-CM | POA: Diagnosis not present

## 2019-12-07 DIAGNOSIS — R509 Fever, unspecified: Secondary | ICD-10-CM | POA: Diagnosis not present

## 2019-12-07 DIAGNOSIS — N051 Unspecified nephritic syndrome with focal and segmental glomerular lesions: Secondary | ICD-10-CM | POA: Diagnosis not present

## 2019-12-07 DIAGNOSIS — F419 Anxiety disorder, unspecified: Secondary | ICD-10-CM | POA: Diagnosis not present

## 2019-12-10 DIAGNOSIS — M25551 Pain in right hip: Secondary | ICD-10-CM | POA: Diagnosis present

## 2019-12-10 DIAGNOSIS — E871 Hypo-osmolality and hyponatremia: Secondary | ICD-10-CM | POA: Diagnosis present

## 2019-12-10 DIAGNOSIS — I151 Hypertension secondary to other renal disorders: Secondary | ICD-10-CM | POA: Diagnosis not present

## 2019-12-10 DIAGNOSIS — F39 Unspecified mood [affective] disorder: Secondary | ICD-10-CM | POA: Diagnosis not present

## 2019-12-10 DIAGNOSIS — R509 Fever, unspecified: Secondary | ICD-10-CM | POA: Diagnosis not present

## 2019-12-10 DIAGNOSIS — N182 Chronic kidney disease, stage 2 (mild): Secondary | ICD-10-CM | POA: Diagnosis present

## 2019-12-10 DIAGNOSIS — F840 Autistic disorder: Secondary | ICD-10-CM | POA: Diagnosis present

## 2019-12-10 DIAGNOSIS — N2889 Other specified disorders of kidney and ureter: Secondary | ICD-10-CM | POA: Diagnosis not present

## 2019-12-10 DIAGNOSIS — E876 Hypokalemia: Secondary | ICD-10-CM | POA: Diagnosis present

## 2019-12-10 DIAGNOSIS — E039 Hypothyroidism, unspecified: Secondary | ICD-10-CM | POA: Diagnosis present

## 2019-12-10 DIAGNOSIS — F419 Anxiety disorder, unspecified: Secondary | ICD-10-CM | POA: Diagnosis present

## 2019-12-10 DIAGNOSIS — R911 Solitary pulmonary nodule: Secondary | ICD-10-CM | POA: Diagnosis not present

## 2019-12-10 DIAGNOSIS — K219 Gastro-esophageal reflux disease without esophagitis: Secondary | ICD-10-CM | POA: Diagnosis present

## 2019-12-10 DIAGNOSIS — F79 Unspecified intellectual disabilities: Secondary | ICD-10-CM | POA: Diagnosis not present

## 2019-12-10 DIAGNOSIS — Z940 Kidney transplant status: Secondary | ICD-10-CM | POA: Diagnosis not present

## 2019-12-10 DIAGNOSIS — D631 Anemia in chronic kidney disease: Secondary | ICD-10-CM | POA: Diagnosis present

## 2019-12-10 DIAGNOSIS — I129 Hypertensive chronic kidney disease with stage 1 through stage 4 chronic kidney disease, or unspecified chronic kidney disease: Secondary | ICD-10-CM | POA: Diagnosis present

## 2019-12-10 DIAGNOSIS — D849 Immunodeficiency, unspecified: Secondary | ICD-10-CM | POA: Diagnosis not present

## 2019-12-10 DIAGNOSIS — T8619 Other complication of kidney transplant: Secondary | ICD-10-CM | POA: Diagnosis present

## 2019-12-12 DIAGNOSIS — Z5181 Encounter for therapeutic drug level monitoring: Secondary | ICD-10-CM | POA: Diagnosis not present

## 2019-12-12 DIAGNOSIS — D84821 Immunodeficiency due to drugs: Secondary | ICD-10-CM | POA: Diagnosis not present

## 2019-12-12 DIAGNOSIS — Z79899 Other long term (current) drug therapy: Secondary | ICD-10-CM | POA: Diagnosis not present

## 2019-12-12 DIAGNOSIS — N185 Chronic kidney disease, stage 5: Secondary | ICD-10-CM | POA: Diagnosis not present

## 2019-12-12 DIAGNOSIS — I120 Hypertensive chronic kidney disease with stage 5 chronic kidney disease or end stage renal disease: Secondary | ICD-10-CM | POA: Diagnosis not present

## 2019-12-12 DIAGNOSIS — Z4822 Encounter for aftercare following kidney transplant: Secondary | ICD-10-CM | POA: Diagnosis not present

## 2019-12-12 DIAGNOSIS — F840 Autistic disorder: Secondary | ICD-10-CM | POA: Diagnosis not present

## 2019-12-12 DIAGNOSIS — Z792 Long term (current) use of antibiotics: Secondary | ICD-10-CM | POA: Diagnosis not present

## 2019-12-12 DIAGNOSIS — E876 Hypokalemia: Secondary | ICD-10-CM | POA: Diagnosis not present

## 2019-12-12 DIAGNOSIS — Z940 Kidney transplant status: Secondary | ICD-10-CM | POA: Diagnosis not present

## 2019-12-12 DIAGNOSIS — D631 Anemia in chronic kidney disease: Secondary | ICD-10-CM | POA: Diagnosis not present

## 2019-12-12 DIAGNOSIS — T8619 Other complication of kidney transplant: Secondary | ICD-10-CM | POA: Diagnosis not present

## 2019-12-12 DIAGNOSIS — N189 Chronic kidney disease, unspecified: Secondary | ICD-10-CM | POA: Diagnosis not present

## 2019-12-12 DIAGNOSIS — R509 Fever, unspecified: Secondary | ICD-10-CM | POA: Diagnosis not present

## 2019-12-12 DIAGNOSIS — I129 Hypertensive chronic kidney disease with stage 1 through stage 4 chronic kidney disease, or unspecified chronic kidney disease: Secondary | ICD-10-CM | POA: Diagnosis not present

## 2019-12-12 DIAGNOSIS — N281 Cyst of kidney, acquired: Secondary | ICD-10-CM | POA: Diagnosis not present

## 2019-12-12 DIAGNOSIS — Z7952 Long term (current) use of systemic steroids: Secondary | ICD-10-CM | POA: Diagnosis not present

## 2019-12-14 DIAGNOSIS — D849 Immunodeficiency, unspecified: Secondary | ICD-10-CM | POA: Diagnosis not present

## 2019-12-14 DIAGNOSIS — Z5181 Encounter for therapeutic drug level monitoring: Secondary | ICD-10-CM | POA: Diagnosis not present

## 2019-12-14 DIAGNOSIS — Z79899 Other long term (current) drug therapy: Secondary | ICD-10-CM | POA: Diagnosis not present

## 2019-12-14 DIAGNOSIS — N281 Cyst of kidney, acquired: Secondary | ICD-10-CM | POA: Diagnosis not present

## 2019-12-14 DIAGNOSIS — Z940 Kidney transplant status: Secondary | ICD-10-CM | POA: Diagnosis not present

## 2019-12-14 DIAGNOSIS — N2581 Secondary hyperparathyroidism of renal origin: Secondary | ICD-10-CM | POA: Diagnosis not present

## 2019-12-14 DIAGNOSIS — D631 Anemia in chronic kidney disease: Secondary | ICD-10-CM | POA: Diagnosis not present

## 2019-12-14 DIAGNOSIS — F840 Autistic disorder: Secondary | ICD-10-CM | POA: Diagnosis not present

## 2019-12-14 DIAGNOSIS — N051 Unspecified nephritic syndrome with focal and segmental glomerular lesions: Secondary | ICD-10-CM | POA: Diagnosis not present

## 2019-12-14 DIAGNOSIS — Z4822 Encounter for aftercare following kidney transplant: Secondary | ICD-10-CM | POA: Diagnosis not present

## 2019-12-14 DIAGNOSIS — N189 Chronic kidney disease, unspecified: Secondary | ICD-10-CM | POA: Diagnosis not present

## 2019-12-17 DIAGNOSIS — Z7952 Long term (current) use of systemic steroids: Secondary | ICD-10-CM | POA: Diagnosis not present

## 2019-12-17 DIAGNOSIS — N186 End stage renal disease: Secondary | ICD-10-CM | POA: Diagnosis not present

## 2019-12-17 DIAGNOSIS — E8770 Fluid overload, unspecified: Secondary | ICD-10-CM | POA: Diagnosis not present

## 2019-12-17 DIAGNOSIS — D631 Anemia in chronic kidney disease: Secondary | ICD-10-CM | POA: Diagnosis not present

## 2019-12-17 DIAGNOSIS — Z79899 Other long term (current) drug therapy: Secondary | ICD-10-CM | POA: Diagnosis not present

## 2019-12-17 DIAGNOSIS — E871 Hypo-osmolality and hyponatremia: Secondary | ICD-10-CM | POA: Diagnosis not present

## 2019-12-17 DIAGNOSIS — D849 Immunodeficiency, unspecified: Secondary | ICD-10-CM | POA: Diagnosis not present

## 2019-12-17 DIAGNOSIS — I120 Hypertensive chronic kidney disease with stage 5 chronic kidney disease or end stage renal disease: Secondary | ICD-10-CM | POA: Diagnosis not present

## 2019-12-17 DIAGNOSIS — Z940 Kidney transplant status: Secondary | ICD-10-CM | POA: Diagnosis not present

## 2019-12-17 DIAGNOSIS — Z4822 Encounter for aftercare following kidney transplant: Secondary | ICD-10-CM | POA: Diagnosis not present

## 2019-12-17 DIAGNOSIS — N281 Cyst of kidney, acquired: Secondary | ICD-10-CM | POA: Diagnosis not present

## 2019-12-17 DIAGNOSIS — N2581 Secondary hyperparathyroidism of renal origin: Secondary | ICD-10-CM | POA: Diagnosis not present

## 2019-12-17 DIAGNOSIS — R05 Cough: Secondary | ICD-10-CM | POA: Diagnosis not present

## 2019-12-17 DIAGNOSIS — R509 Fever, unspecified: Secondary | ICD-10-CM | POA: Diagnosis not present

## 2019-12-18 DIAGNOSIS — N186 End stage renal disease: Secondary | ICD-10-CM | POA: Diagnosis not present

## 2019-12-18 DIAGNOSIS — N051 Unspecified nephritic syndrome with focal and segmental glomerular lesions: Secondary | ICD-10-CM | POA: Diagnosis not present

## 2019-12-20 DIAGNOSIS — N281 Cyst of kidney, acquired: Secondary | ICD-10-CM | POA: Diagnosis not present

## 2019-12-20 DIAGNOSIS — R8281 Pyuria: Secondary | ICD-10-CM | POA: Diagnosis not present

## 2019-12-20 DIAGNOSIS — R509 Fever, unspecified: Secondary | ICD-10-CM | POA: Diagnosis not present

## 2019-12-20 DIAGNOSIS — Z792 Long term (current) use of antibiotics: Secondary | ICD-10-CM | POA: Diagnosis not present

## 2019-12-20 DIAGNOSIS — F840 Autistic disorder: Secondary | ICD-10-CM | POA: Diagnosis not present

## 2019-12-20 DIAGNOSIS — D649 Anemia, unspecified: Secondary | ICD-10-CM | POA: Diagnosis not present

## 2019-12-20 DIAGNOSIS — F79 Unspecified intellectual disabilities: Secondary | ICD-10-CM | POA: Diagnosis not present

## 2019-12-20 DIAGNOSIS — D849 Immunodeficiency, unspecified: Secondary | ICD-10-CM | POA: Diagnosis not present

## 2019-12-20 DIAGNOSIS — N051 Unspecified nephritic syndrome with focal and segmental glomerular lesions: Secondary | ICD-10-CM | POA: Diagnosis not present

## 2019-12-20 DIAGNOSIS — Z4822 Encounter for aftercare following kidney transplant: Secondary | ICD-10-CM | POA: Diagnosis not present

## 2019-12-20 DIAGNOSIS — Z481 Encounter for planned postprocedural wound closure: Secondary | ICD-10-CM | POA: Diagnosis not present

## 2019-12-20 DIAGNOSIS — I151 Hypertension secondary to other renal disorders: Secondary | ICD-10-CM | POA: Diagnosis not present

## 2019-12-20 DIAGNOSIS — Z79899 Other long term (current) drug therapy: Secondary | ICD-10-CM | POA: Diagnosis not present

## 2019-12-20 DIAGNOSIS — Z940 Kidney transplant status: Secondary | ICD-10-CM | POA: Diagnosis not present

## 2019-12-20 DIAGNOSIS — I10 Essential (primary) hypertension: Secondary | ICD-10-CM | POA: Diagnosis not present

## 2019-12-20 DIAGNOSIS — N2889 Other specified disorders of kidney and ureter: Secondary | ICD-10-CM | POA: Diagnosis not present

## 2019-12-20 DIAGNOSIS — Z7952 Long term (current) use of systemic steroids: Secondary | ICD-10-CM | POA: Diagnosis not present

## 2019-12-20 DIAGNOSIS — N2581 Secondary hyperparathyroidism of renal origin: Secondary | ICD-10-CM | POA: Diagnosis not present

## 2019-12-20 DIAGNOSIS — R600 Localized edema: Secondary | ICD-10-CM | POA: Diagnosis not present

## 2019-12-24 DIAGNOSIS — Z7952 Long term (current) use of systemic steroids: Secondary | ICD-10-CM | POA: Diagnosis not present

## 2019-12-24 DIAGNOSIS — Z792 Long term (current) use of antibiotics: Secondary | ICD-10-CM | POA: Diagnosis not present

## 2019-12-24 DIAGNOSIS — D72819 Decreased white blood cell count, unspecified: Secondary | ICD-10-CM | POA: Diagnosis not present

## 2019-12-24 DIAGNOSIS — N281 Cyst of kidney, acquired: Secondary | ICD-10-CM | POA: Diagnosis not present

## 2019-12-24 DIAGNOSIS — R509 Fever, unspecified: Secondary | ICD-10-CM | POA: Diagnosis not present

## 2019-12-24 DIAGNOSIS — D8989 Other specified disorders involving the immune mechanism, not elsewhere classified: Secondary | ICD-10-CM | POA: Diagnosis not present

## 2019-12-24 DIAGNOSIS — Z4822 Encounter for aftercare following kidney transplant: Secondary | ICD-10-CM | POA: Diagnosis not present

## 2019-12-24 DIAGNOSIS — Z940 Kidney transplant status: Secondary | ICD-10-CM | POA: Diagnosis not present

## 2019-12-24 DIAGNOSIS — E875 Hyperkalemia: Secondary | ICD-10-CM | POA: Diagnosis not present

## 2019-12-24 DIAGNOSIS — Q618 Other cystic kidney diseases: Secondary | ICD-10-CM | POA: Diagnosis not present

## 2019-12-24 DIAGNOSIS — N189 Chronic kidney disease, unspecified: Secondary | ICD-10-CM | POA: Diagnosis not present

## 2019-12-24 DIAGNOSIS — D649 Anemia, unspecified: Secondary | ICD-10-CM | POA: Diagnosis not present

## 2019-12-24 DIAGNOSIS — I10 Essential (primary) hypertension: Secondary | ICD-10-CM | POA: Diagnosis not present

## 2019-12-24 DIAGNOSIS — N2581 Secondary hyperparathyroidism of renal origin: Secondary | ICD-10-CM | POA: Diagnosis not present

## 2019-12-24 DIAGNOSIS — D631 Anemia in chronic kidney disease: Secondary | ICD-10-CM | POA: Diagnosis not present

## 2019-12-24 DIAGNOSIS — N186 End stage renal disease: Secondary | ICD-10-CM | POA: Diagnosis not present

## 2019-12-24 DIAGNOSIS — E212 Other hyperparathyroidism: Secondary | ICD-10-CM | POA: Diagnosis not present

## 2019-12-24 DIAGNOSIS — F840 Autistic disorder: Secondary | ICD-10-CM | POA: Diagnosis not present

## 2019-12-25 ENCOUNTER — Other Ambulatory Visit: Payer: Self-pay | Admitting: *Deleted

## 2019-12-25 ENCOUNTER — Ambulatory Visit: Payer: Medicaid Other | Admitting: Family Medicine

## 2019-12-25 DIAGNOSIS — R112 Nausea with vomiting, unspecified: Secondary | ICD-10-CM

## 2019-12-26 MED ORDER — OMEPRAZOLE 40 MG PO CPDR: DELAYED_RELEASE_CAPSULE | ORAL | 1 refills | Status: DC

## 2019-12-27 DIAGNOSIS — E875 Hyperkalemia: Secondary | ICD-10-CM | POA: Diagnosis not present

## 2019-12-27 DIAGNOSIS — Z79899 Other long term (current) drug therapy: Secondary | ICD-10-CM | POA: Diagnosis not present

## 2019-12-27 DIAGNOSIS — Z792 Long term (current) use of antibiotics: Secondary | ICD-10-CM | POA: Diagnosis not present

## 2019-12-27 DIAGNOSIS — D72819 Decreased white blood cell count, unspecified: Secondary | ICD-10-CM | POA: Diagnosis not present

## 2019-12-27 DIAGNOSIS — I10 Essential (primary) hypertension: Secondary | ICD-10-CM | POA: Diagnosis not present

## 2019-12-27 DIAGNOSIS — N2581 Secondary hyperparathyroidism of renal origin: Secondary | ICD-10-CM | POA: Diagnosis not present

## 2019-12-27 DIAGNOSIS — E212 Other hyperparathyroidism: Secondary | ICD-10-CM | POA: Diagnosis not present

## 2019-12-27 DIAGNOSIS — D8989 Other specified disorders involving the immune mechanism, not elsewhere classified: Secondary | ICD-10-CM | POA: Diagnosis not present

## 2019-12-27 DIAGNOSIS — Z960 Presence of urogenital implants: Secondary | ICD-10-CM | POA: Diagnosis not present

## 2019-12-27 DIAGNOSIS — N281 Cyst of kidney, acquired: Secondary | ICD-10-CM | POA: Diagnosis not present

## 2019-12-27 DIAGNOSIS — D649 Anemia, unspecified: Secondary | ICD-10-CM | POA: Diagnosis not present

## 2019-12-27 DIAGNOSIS — Z7952 Long term (current) use of systemic steroids: Secondary | ICD-10-CM | POA: Diagnosis not present

## 2019-12-27 DIAGNOSIS — Z87448 Personal history of other diseases of urinary system: Secondary | ICD-10-CM | POA: Diagnosis not present

## 2019-12-27 DIAGNOSIS — Z4822 Encounter for aftercare following kidney transplant: Secondary | ICD-10-CM | POA: Diagnosis not present

## 2019-12-27 DIAGNOSIS — Z940 Kidney transplant status: Secondary | ICD-10-CM | POA: Diagnosis not present

## 2019-12-28 DIAGNOSIS — N051 Unspecified nephritic syndrome with focal and segmental glomerular lesions: Secondary | ICD-10-CM | POA: Diagnosis not present

## 2019-12-28 DIAGNOSIS — I10 Essential (primary) hypertension: Secondary | ICD-10-CM | POA: Diagnosis not present

## 2019-12-28 DIAGNOSIS — K219 Gastro-esophageal reflux disease without esophagitis: Secondary | ICD-10-CM | POA: Diagnosis not present

## 2019-12-28 DIAGNOSIS — Z466 Encounter for fitting and adjustment of urinary device: Secondary | ICD-10-CM | POA: Diagnosis not present

## 2019-12-28 DIAGNOSIS — N186 End stage renal disease: Secondary | ICD-10-CM | POA: Diagnosis not present

## 2019-12-28 DIAGNOSIS — Z940 Kidney transplant status: Secondary | ICD-10-CM | POA: Diagnosis not present

## 2020-01-01 DIAGNOSIS — Z4822 Encounter for aftercare following kidney transplant: Secondary | ICD-10-CM | POA: Diagnosis not present

## 2020-01-01 DIAGNOSIS — Z7952 Long term (current) use of systemic steroids: Secondary | ICD-10-CM | POA: Diagnosis not present

## 2020-01-01 DIAGNOSIS — Z792 Long term (current) use of antibiotics: Secondary | ICD-10-CM | POA: Diagnosis not present

## 2020-01-01 DIAGNOSIS — D849 Immunodeficiency, unspecified: Secondary | ICD-10-CM | POA: Diagnosis not present

## 2020-01-01 DIAGNOSIS — N2581 Secondary hyperparathyroidism of renal origin: Secondary | ICD-10-CM | POA: Diagnosis not present

## 2020-01-01 DIAGNOSIS — D72819 Decreased white blood cell count, unspecified: Secondary | ICD-10-CM | POA: Diagnosis not present

## 2020-01-01 DIAGNOSIS — E031 Congenital hypothyroidism without goiter: Secondary | ICD-10-CM | POA: Diagnosis not present

## 2020-01-01 DIAGNOSIS — I10 Essential (primary) hypertension: Secondary | ICD-10-CM | POA: Diagnosis not present

## 2020-01-01 DIAGNOSIS — Z79899 Other long term (current) drug therapy: Secondary | ICD-10-CM | POA: Diagnosis not present

## 2020-01-01 DIAGNOSIS — D649 Anemia, unspecified: Secondary | ICD-10-CM | POA: Diagnosis not present

## 2020-01-01 DIAGNOSIS — N051 Unspecified nephritic syndrome with focal and segmental glomerular lesions: Secondary | ICD-10-CM | POA: Diagnosis not present

## 2020-01-01 DIAGNOSIS — E875 Hyperkalemia: Secondary | ICD-10-CM | POA: Diagnosis not present

## 2020-01-01 DIAGNOSIS — N281 Cyst of kidney, acquired: Secondary | ICD-10-CM | POA: Diagnosis not present

## 2020-01-01 DIAGNOSIS — Z940 Kidney transplant status: Secondary | ICD-10-CM | POA: Diagnosis not present

## 2020-01-07 DIAGNOSIS — F840 Autistic disorder: Secondary | ICD-10-CM | POA: Diagnosis not present

## 2020-01-07 DIAGNOSIS — Z7952 Long term (current) use of systemic steroids: Secondary | ICD-10-CM | POA: Diagnosis not present

## 2020-01-07 DIAGNOSIS — D72819 Decreased white blood cell count, unspecified: Secondary | ICD-10-CM | POA: Diagnosis not present

## 2020-01-07 DIAGNOSIS — K219 Gastro-esophageal reflux disease without esophagitis: Secondary | ICD-10-CM | POA: Diagnosis not present

## 2020-01-07 DIAGNOSIS — I10 Essential (primary) hypertension: Secondary | ICD-10-CM | POA: Diagnosis not present

## 2020-01-07 DIAGNOSIS — D849 Immunodeficiency, unspecified: Secondary | ICD-10-CM | POA: Diagnosis not present

## 2020-01-07 DIAGNOSIS — F79 Unspecified intellectual disabilities: Secondary | ICD-10-CM | POA: Diagnosis not present

## 2020-01-07 DIAGNOSIS — F419 Anxiety disorder, unspecified: Secondary | ICD-10-CM | POA: Diagnosis not present

## 2020-01-07 DIAGNOSIS — F39 Unspecified mood [affective] disorder: Secondary | ICD-10-CM | POA: Diagnosis not present

## 2020-01-07 DIAGNOSIS — E875 Hyperkalemia: Secondary | ICD-10-CM | POA: Diagnosis not present

## 2020-01-07 DIAGNOSIS — E039 Hypothyroidism, unspecified: Secondary | ICD-10-CM | POA: Diagnosis not present

## 2020-01-07 DIAGNOSIS — I151 Hypertension secondary to other renal disorders: Secondary | ICD-10-CM | POA: Diagnosis not present

## 2020-01-07 DIAGNOSIS — Z940 Kidney transplant status: Secondary | ICD-10-CM | POA: Diagnosis not present

## 2020-01-07 DIAGNOSIS — N051 Unspecified nephritic syndrome with focal and segmental glomerular lesions: Secondary | ICD-10-CM | POA: Diagnosis not present

## 2020-01-07 DIAGNOSIS — R8281 Pyuria: Secondary | ICD-10-CM | POA: Diagnosis not present

## 2020-01-07 DIAGNOSIS — N2889 Other specified disorders of kidney and ureter: Secondary | ICD-10-CM | POA: Diagnosis not present

## 2020-01-07 DIAGNOSIS — Z792 Long term (current) use of antibiotics: Secondary | ICD-10-CM | POA: Diagnosis not present

## 2020-01-07 DIAGNOSIS — D649 Anemia, unspecified: Secondary | ICD-10-CM | POA: Diagnosis not present

## 2020-01-07 DIAGNOSIS — Z79899 Other long term (current) drug therapy: Secondary | ICD-10-CM | POA: Diagnosis not present

## 2020-01-07 DIAGNOSIS — Z4822 Encounter for aftercare following kidney transplant: Secondary | ICD-10-CM | POA: Diagnosis not present

## 2020-01-08 DIAGNOSIS — F319 Bipolar disorder, unspecified: Secondary | ICD-10-CM | POA: Diagnosis not present

## 2020-01-08 DIAGNOSIS — F71 Moderate intellectual disabilities: Secondary | ICD-10-CM | POA: Diagnosis not present

## 2020-01-08 DIAGNOSIS — F422 Mixed obsessional thoughts and acts: Secondary | ICD-10-CM | POA: Diagnosis not present

## 2020-01-08 DIAGNOSIS — F329 Major depressive disorder, single episode, unspecified: Secondary | ICD-10-CM | POA: Diagnosis not present

## 2020-01-14 DIAGNOSIS — Z79899 Other long term (current) drug therapy: Secondary | ICD-10-CM | POA: Diagnosis not present

## 2020-01-14 DIAGNOSIS — Z7952 Long term (current) use of systemic steroids: Secondary | ICD-10-CM | POA: Diagnosis not present

## 2020-01-14 DIAGNOSIS — Z4822 Encounter for aftercare following kidney transplant: Secondary | ICD-10-CM | POA: Diagnosis not present

## 2020-01-14 DIAGNOSIS — Z5181 Encounter for therapeutic drug level monitoring: Secondary | ICD-10-CM | POA: Diagnosis not present

## 2020-01-14 DIAGNOSIS — I10 Essential (primary) hypertension: Secondary | ICD-10-CM | POA: Diagnosis not present

## 2020-01-14 DIAGNOSIS — F840 Autistic disorder: Secondary | ICD-10-CM | POA: Diagnosis not present

## 2020-01-14 DIAGNOSIS — D649 Anemia, unspecified: Secondary | ICD-10-CM | POA: Diagnosis not present

## 2020-01-14 DIAGNOSIS — N2581 Secondary hyperparathyroidism of renal origin: Secondary | ICD-10-CM | POA: Diagnosis not present

## 2020-01-14 DIAGNOSIS — D849 Immunodeficiency, unspecified: Secondary | ICD-10-CM | POA: Diagnosis not present

## 2020-01-14 DIAGNOSIS — Z792 Long term (current) use of antibiotics: Secondary | ICD-10-CM | POA: Diagnosis not present

## 2020-01-14 DIAGNOSIS — Q6101 Congenital single renal cyst: Secondary | ICD-10-CM | POA: Diagnosis not present

## 2020-01-14 DIAGNOSIS — E875 Hyperkalemia: Secondary | ICD-10-CM | POA: Diagnosis not present

## 2020-01-14 DIAGNOSIS — Z940 Kidney transplant status: Secondary | ICD-10-CM | POA: Diagnosis not present

## 2020-01-21 DIAGNOSIS — R0789 Other chest pain: Secondary | ICD-10-CM | POA: Diagnosis not present

## 2020-01-21 DIAGNOSIS — E875 Hyperkalemia: Secondary | ICD-10-CM | POA: Diagnosis not present

## 2020-01-21 DIAGNOSIS — D849 Immunodeficiency, unspecified: Secondary | ICD-10-CM | POA: Diagnosis not present

## 2020-01-21 DIAGNOSIS — Z7952 Long term (current) use of systemic steroids: Secondary | ICD-10-CM | POA: Diagnosis not present

## 2020-01-21 DIAGNOSIS — I151 Hypertension secondary to other renal disorders: Secondary | ICD-10-CM | POA: Diagnosis not present

## 2020-01-21 DIAGNOSIS — F79 Unspecified intellectual disabilities: Secondary | ICD-10-CM | POA: Diagnosis not present

## 2020-01-21 DIAGNOSIS — N051 Unspecified nephritic syndrome with focal and segmental glomerular lesions: Secondary | ICD-10-CM | POA: Diagnosis not present

## 2020-01-21 DIAGNOSIS — Z792 Long term (current) use of antibiotics: Secondary | ICD-10-CM | POA: Diagnosis not present

## 2020-01-21 DIAGNOSIS — Z4822 Encounter for aftercare following kidney transplant: Secondary | ICD-10-CM | POA: Diagnosis not present

## 2020-01-21 DIAGNOSIS — I10 Essential (primary) hypertension: Secondary | ICD-10-CM | POA: Diagnosis not present

## 2020-01-21 DIAGNOSIS — N2581 Secondary hyperparathyroidism of renal origin: Secondary | ICD-10-CM | POA: Diagnosis not present

## 2020-01-21 DIAGNOSIS — Z7982 Long term (current) use of aspirin: Secondary | ICD-10-CM | POA: Diagnosis not present

## 2020-01-21 DIAGNOSIS — D84821 Immunodeficiency due to drugs: Secondary | ICD-10-CM | POA: Diagnosis not present

## 2020-01-21 DIAGNOSIS — E031 Congenital hypothyroidism without goiter: Secondary | ICD-10-CM | POA: Diagnosis not present

## 2020-01-21 DIAGNOSIS — Z79899 Other long term (current) drug therapy: Secondary | ICD-10-CM | POA: Diagnosis not present

## 2020-01-21 DIAGNOSIS — N2889 Other specified disorders of kidney and ureter: Secondary | ICD-10-CM | POA: Diagnosis not present

## 2020-01-21 DIAGNOSIS — Z940 Kidney transplant status: Secondary | ICD-10-CM | POA: Diagnosis not present

## 2020-01-21 DIAGNOSIS — D649 Anemia, unspecified: Secondary | ICD-10-CM | POA: Diagnosis not present

## 2020-01-28 DIAGNOSIS — Z79899 Other long term (current) drug therapy: Secondary | ICD-10-CM | POA: Diagnosis not present

## 2020-01-28 DIAGNOSIS — Z4822 Encounter for aftercare following kidney transplant: Secondary | ICD-10-CM | POA: Diagnosis not present

## 2020-01-28 DIAGNOSIS — F79 Unspecified intellectual disabilities: Secondary | ICD-10-CM | POA: Diagnosis not present

## 2020-01-28 DIAGNOSIS — N281 Cyst of kidney, acquired: Secondary | ICD-10-CM | POA: Diagnosis not present

## 2020-01-28 DIAGNOSIS — N186 End stage renal disease: Secondary | ICD-10-CM | POA: Diagnosis not present

## 2020-01-28 DIAGNOSIS — N2581 Secondary hyperparathyroidism of renal origin: Secondary | ICD-10-CM | POA: Diagnosis not present

## 2020-01-28 DIAGNOSIS — D72819 Decreased white blood cell count, unspecified: Secondary | ICD-10-CM | POA: Diagnosis not present

## 2020-01-28 DIAGNOSIS — I120 Hypertensive chronic kidney disease with stage 5 chronic kidney disease or end stage renal disease: Secondary | ICD-10-CM | POA: Diagnosis not present

## 2020-01-28 DIAGNOSIS — Z992 Dependence on renal dialysis: Secondary | ICD-10-CM | POA: Diagnosis not present

## 2020-01-28 DIAGNOSIS — E875 Hyperkalemia: Secondary | ICD-10-CM | POA: Diagnosis not present

## 2020-01-28 DIAGNOSIS — D649 Anemia, unspecified: Secondary | ICD-10-CM | POA: Diagnosis not present

## 2020-01-28 DIAGNOSIS — Z940 Kidney transplant status: Secondary | ICD-10-CM | POA: Diagnosis not present

## 2020-01-28 NOTE — Progress Notes (Signed)
Electrophysiology Office Note:    Date:  01/29/2020   ID:  Vanessa Alvarez, DOB 07/27/1985, MRN [id number]  PCP:  Cleophas Dunker, DO  [place] Cardiologist:  No primary care provider on file.  [facility] Electrophysiologist:  None   Referring MD: Cleophas Dunker, *   Chief Complaint: Chest pain  History of Present Illness:    Vanessa Alvarez is a 34 y.o. female who presents for an evaluation of chest pain at the request of Dr Sandi Carne. 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  She has seen Dr. Irish Lack for the same in 2018.  At that appointment, an echocardiogram was ordered which showed normal left ventricular function and no significant valvular abnormalities.  Today, she tells me that she has had the same chest pain dating back to her previous appointment in 2018. Chest pains come and go without reliable triggers. They're located in the epigastrium/lower chest and radiate at times to the back and at other times around the left breast. She describes them as "painful". No association with shortness of breath, nausea, vomiting. She has hip pain that limits her mobility.  Past Medical History:  Diagnosis Date  . Anemia    Iron Def  . Anxiety   . Autism spectrum   . Behavioral problems   . Depression   . End stage renal disease ([place])   . GERD (gastroesophageal reflux disease)   . Hyperlipidemia   . Hypertension   . HYPOTHYROIDISM 03/12/2009   Qualifier: Diagnosis of  By: Earley Favor MD, Cat    . MENTAL RETARDATION 06/23/2006   Qualifier: Diagnosis of  By: Eusebio Friendly    . Mood disorder [facility])    Sees Dr Silvio Pate, who prescribes meds  . Shortness of breath dyspnea    with exertion    Past Surgical History:  Procedure Laterality Date  . AV FISTULA PLACEMENT Left 03/17/2015   Procedure: CREATION OF LEFT BRACHIOCEPHALIC ARTERIOVENOUS (AV) FISTULA ;  Surgeon: Conrad [place], MD;  Location: [place];  Service:  Vascular;  Laterality: Left;  . CHOLECYSTECTOMY  11/2004  . INSERTION OF DIALYSIS CATHETER Right 03/17/2015   Procedure: INSERTION OF DIALYSIS CATHETER RIGHT INTERNAL JUIGULAR PLACEMENT;  Surgeon: Conrad [place], MD;  Location: [place];  Service: Vascular;  Laterality: Right;    Current Medications: Current Meds  Medication Sig  . ARIPiprazole (ABILIFY) 5 MG tablet Take 5 mg by mouth at bedtime.  Marland Kitchen aspirin EC 81 MG tablet Take 81 mg by mouth daily. Swallow whole.  . busPIRone (BUSPAR) 5 MG tablet Take 5 mg by mouth 3 (three) times daily.   . ferrous sulfate 325 (65 FE) MG tablet Take 1 tablet (325 mg total) by mouth 2 (two) times daily.  . furosemide (LASIX) 20 MG tablet Take 20 mg by mouth daily.  . hydrOXYzine (ATARAX/VISTARIL) 25 MG tablet Take 25 mg by mouth 3 (three) times daily as needed.  . lamoTRIgine (LAMICTAL) 150 MG tablet Take 150 mg by mouth at bedtime.  Marland Kitchen levothyroxine (SYNTHROID) 112 MCG tablet TAKE 1 TABLET BY MOUTH DAILY BEFORE BREAKFAST  . mycophenolate (MYFORTIC) 180 MG EC tablet Take 720 mg by mouth 2 (two) times daily. Take 4 tablets (720 mg ) by mouth twice daily  . omeprazole (PRILOSEC) 40 MG capsule TAKE 1 CAPSULE BY MOUTH EVERY DAY  . polyethylene glycol powder (GLYCOLAX/MIRALAX) 17 GM/SCOOP powder Take 17 g by mouth daily as needed for mild constipation. Reported  on 05/26/2015  . predniSONE (DELTASONE) 5 MG tablet Take 5 mg by mouth as directed.  . sennosides-docusate sodium (SENOKOT-S) 8.6-50 MG tablet Take 1 tablet by mouth in the morning and at bedtime.  . sertraline (ZOLOFT) 100 MG tablet Take 100 mg by mouth daily.   Marland Kitchen sulfamethoxazole-trimethoprim (BACTRIM) 400-80 MG tablet Take 1 tablet by mouth 3 (three) times a week. On Monday, Wednesday, and Friday  . Tacrolimus ER (ENVARSUS XR) 1 MG TB24 Take by mouth. Take with 4 mg tablets for a total daily dose of 7 mg  . Tacrolimus ER (ENVARSUS XR) 4 MG TB24 Take by mouth. Take with 1 mg tablet for a total daily dose of 7  mg  . valGANciclovir (VALCYTE) 450 MG tablet Take 450 mg by mouth daily.     Allergies:   Patient has no known allergies.   Social History   Socioeconomic History  . Marital status: Single    Spouse name: Not on file  . Number of children: 0  . Years of education: Not on file  . Highest education level: Not on file  Occupational History  . Occupation: disabled  Tobacco Use  . Smoking status: Never Smoker  . Smokeless tobacco: Never Used  Substance and Sexual Activity  . Alcohol use: No    Alcohol/week: 0.0 standard drinks  . Drug use: No  . Sexual activity: Never  Other Topics Concern  . Not on file  Social History Narrative   ** Merged History Encounter **       Lives with mother.   Very sedentary livestyle.   No etoh, tobacco, drugs, intercourse.      MR.    Social Determinants of Health   Financial Resource Strain:   . Difficulty of Paying Living Expenses: Not on file  Food Insecurity:   . Worried About Charity fundraiser in the Last Year: Not on file  . Ran Out of Food in the Last Year: Not on file  Transportation Needs:   . Lack of Transportation (Medical): Not on file  . Lack of Transportation (Non-Medical): Not on file  Physical Activity:   . Days of Exercise per Week: Not on file  . Minutes of Exercise per Session: Not on file  Stress:   . Feeling of Stress : Not on file  Social Connections:   . Frequency of Communication with Friends and Family: Not on file  . Frequency of Social Gatherings with Friends and Family: Not on file  . Attends Religious Services: Not on file  . Active Member of Clubs or Organizations: Not on file  . Attends Archivist Meetings: Not on file  . Marital Status: Not on file     Family History: The patient's family history includes Bladder Cancer in her maternal grandmother; Cancer in her maternal grandfather; Diabetes in her maternal grandfather and maternal grandmother; Heart disease in her maternal grandmother;  Hypertension in her maternal grandmother and mother.  ROS:   Please see the history of present illness.    All other systems reviewed and are negative.  EKGs/Labs/Other Studies Reviewed:    The following studies were reviewed today: Echo, previous notes  July 2018 echo personally reviewed EF 60%, normal wall motion No significant valvular abnormalities  EKG:  The ekg ordered today demonstrates sinus rhythm. No ST/T wave changes or evidence of prior infarction.  Recent Labs: 06/15/2019: TSH 1.590  Recent Lipid Panel    Component Value Date/Time   CHOL 176 05/28/2016  6948   TRIG 106 05/28/2016 0833   HDL 60 05/28/2016 0833   CHOLHDL 2.9 05/28/2016 0833   VLDL 21 05/28/2016 0833   LDLCALC 95 05/28/2016 0833    Physical Exam:    VS:  BP 130/72   Pulse 93   Ht 5\' 4"  (1.626 m)   Wt 159 lb 12.8 oz (72.5 kg)   LMP  (LMP Unknown)   SpO2 98%   BMI 27.43 kg/m     Wt Readings from Last 3 Encounters:  01/29/20 159 lb 12.8 oz (72.5 kg)  08/22/19 148 lb 9.6 oz (67.4 kg)  06/15/19 147 lb 12.8 oz (67 kg)     GEN:  Well nourished, well developed in no acute distress HEENT: Normal NECK: No JVD; No carotid bruits LYMPHATICS: No lymphadenopathy CARDIAC: RRR, no murmurs, rubs, gallops. AV fistula present in the left upper extremity. RESPIRATORY:  Clear to auscultation without rales, wheezing or rhonchi  ABDOMEN: Soft, non-tender, non-distended MUSCULOSKELETAL:  No edema; No deformity  SKIN: Warm and dry NEUROLOGIC:  Alert and oriented x 3 PSYCHIATRIC:  Normal affect   ASSESSMENT:    1. Left-sided chest pain   2. ESRD on dialysis [facility])    PLAN:    In order of problems listed above:  1. Chest pain, atypical The patient has had atypical sounding chest pain for at least 3 years. Discomfort is located in the epigastrium and sometimes radiates around the left breast. Other times, he goes from the epigastrium straight to the back. It is not associated with shortness of breath,  nausea, vomiting, dizziness. There are some question from the family whether or not this could be related to GI upset/GERD. I think it is certainly possible that these symptoms are representative of GERD and thus have recommended she use Tums or Maalox as needed for symptomatic relief. If the symptoms persist, I have advised the patient and her mother to reach out to the clinic and we can order a stress test to further evaluate. I did offer this during today's visit but the patient is reluctant to pursue a stress test given her mother's experience with the same.  2. ESRD status post renal transplant Was on dialysis for 5 years but is now successfully off dialysis after the kidney transplant.  3. GERD Likely contributing to her symptoms. Have recommended Maalox and Tums as needed. Continue Prilosec.  Plan to follow-up in 6 months.   Medication Adjustments/Labs and Tests Ordered: Current medicines are reviewed at length with the patient today.  Concerns regarding medicines are outlined above.  Orders Placed This Encounter  Procedures  . EKG 12-Lead   No orders of the defined types were placed in this encounter.    Signed, Lars Mage, MD, [facility]  01/29/2020 8:59 AM    Electrophysiology [facility]

## 2020-01-29 ENCOUNTER — Other Ambulatory Visit: Payer: Self-pay

## 2020-01-29 ENCOUNTER — Ambulatory Visit (INDEPENDENT_AMBULATORY_CARE_PROVIDER_SITE_OTHER): Payer: Medicare Other | Admitting: Cardiology

## 2020-01-29 ENCOUNTER — Encounter: Payer: Self-pay | Admitting: Cardiology

## 2020-01-29 VITALS — BP 130/72 | HR 93 | Ht 64.0 in | Wt 159.8 lb

## 2020-01-29 DIAGNOSIS — N186 End stage renal disease: Secondary | ICD-10-CM | POA: Diagnosis not present

## 2020-01-29 DIAGNOSIS — R079 Chest pain, unspecified: Secondary | ICD-10-CM

## 2020-01-29 DIAGNOSIS — Z992 Dependence on renal dialysis: Secondary | ICD-10-CM

## 2020-01-29 NOTE — Patient Instructions (Addendum)
Medication Instructions:  Your physician recommends that you continue on your current medications as directed. Please refer to the Current Medication list given to you today. *If you need a refill on your cardiac medications before your next appointment, please call your pharmacy*  Lab Work: None ordered.  If you have labs (blood work) drawn today and your tests are completely normal, you will receive your results only by: Marland Kitchen MyChart Message (if you have MyChart) OR . A paper copy in the mail If you have any lab test that is abnormal or we need to change your treatment, we will call you to review the results.  Testing/Procedures: None ordered.  Follow-Up: At [facility], you and your health needs are our priority.  As part of our continuing mission to provide you with exceptional heart care, we have created designated Provider Care Teams.  These Care Teams include your primary Cardiologist (physician) and Advanced Practice Providers (APPs -  Physician Assistants and Nurse Practitioners) who all work together to provide you with the care you need, when you need it.  Your next appointment:   Your physician wants you to follow-up in: 6 months with Dr. Quentin Ore.  You will receive a reminder letter in the mail two months in advance. If you don't receive a letter, please call our office to schedule the follow-up appointment.

## 2020-02-04 ENCOUNTER — Other Ambulatory Visit: Payer: Self-pay

## 2020-02-04 ENCOUNTER — Ambulatory Visit (INDEPENDENT_AMBULATORY_CARE_PROVIDER_SITE_OTHER): Payer: Medicare Other | Admitting: Family Medicine

## 2020-02-04 ENCOUNTER — Ambulatory Visit
Admission: RE | Admit: 2020-02-04 | Discharge: 2020-02-04 | Disposition: A | Discharge status: Home / Self Care | Payer: Medicare Other | Source: Ambulatory Visit | Attending: Family Medicine | Admitting: Family Medicine

## 2020-02-04 VITALS — BP 120/70 | HR 80 | Wt 160.0 lb

## 2020-02-04 DIAGNOSIS — M25551 Pain in right hip: Secondary | ICD-10-CM | POA: Insufficient documentation

## 2020-02-04 DIAGNOSIS — Z23 Encounter for immunization: Secondary | ICD-10-CM

## 2020-02-04 NOTE — Progress Notes (Signed)
° ° °  SUBJECTIVE:   CHIEF COMPLAINT / HPI:   Hip pain  Right lateral hip pain x 1 year. No trauma. Sharp pain with movement. Sometimes with loner periods of walking, sometimes, when she first stands up. Resting sometimes makes it feel better. Has not tried any medications. No warmth of redness around the area. Hurting every single day. Sometimes pain will go away for hours.   Sometimes makes a popping noise.  No fevers or chills.   PERTINENT  PMH / PSH: ASD, ID, FSGS s/p kidney transplant 12/09/19, immunocompromised   OBJECTIVE:   BP 120/70    Pulse 80    Wt 160 lb (72.6 kg)    LMP  (LMP Unknown)    BMI 27.46 kg/m   General: well appearing female, NAD  Hip:   Inspection deferred.    No tenderness at bony prominences or major muscle groups.    Full range of motion with both active and passive hip flexion/extension, internal/external rotation, adduction/abduction.   5 out of 5 strength in large muscle groups of lower extremities bilaterally.    No sensory deficits bilaterally.    Negative FADIR. FABIR  Asymmetric, nonantalgic gait, negative trendelenburg.  Back: No tenderness to palpation of lumbar, thoracic spinous processes.  No tender nodes to palpation of major muscle groups in lower back.  Thoraco/lumbar levoscoliosis.   ASSESSMENT/PLAN:   Right hip pain Patient presenting with chronic right hip pain x1 year.  Patient reports lateral and posterior hip pain.  Physical exam is overall benign with 5 out of 5 strength and no tenderness.  Unable to elicit pain with walking around the clinic.  Patient denies any trauma, but mother does note that patient has had history of falling on her lower extremities with temper tantrums.  No previous MSK injuries or surgeries.  Given benign exam, will obtain x-ray to rule out any fractures, bone mineral disease in the setting of her renal disease.  No fevers to suggest osteomyelitis.  Low risk for AVN.  If x-ray comes back negative, will send  patient to physical therapy, which they are agreeable on today.  Over-the-counter medications to take include Tylenol and Voltaren gel.  Avoid NSAIDs given history of transplant.   Wilber Oliphant, MD [place]

## 2020-02-04 NOTE — Assessment & Plan Note (Signed)
Patient presenting with chronic right hip pain x1 year.  Patient reports lateral and posterior hip pain.  Physical exam is overall benign with 5 out of 5 strength and no tenderness.  Unable to elicit pain with walking around the clinic.  Patient denies any trauma, but mother does note that patient has had history of falling on her lower extremities with temper tantrums.  No previous MSK injuries or surgeries.  Given benign exam, will obtain x-ray to rule out any fractures, bone mineral disease in the setting of her renal disease.  No fevers to suggest osteomyelitis.  Low risk for AVN.  If x-ray comes back negative, will send patient to physical therapy, which they are agreeable on today.  Over-the-counter medications to take include Tylenol and Voltaren gel.  Avoid NSAIDs given history of transplant.

## 2020-02-11 DIAGNOSIS — Z79899 Other long term (current) drug therapy: Secondary | ICD-10-CM | POA: Diagnosis not present

## 2020-02-11 DIAGNOSIS — E212 Other hyperparathyroidism: Secondary | ICD-10-CM | POA: Diagnosis not present

## 2020-02-11 DIAGNOSIS — D72819 Decreased white blood cell count, unspecified: Secondary | ICD-10-CM | POA: Diagnosis not present

## 2020-02-11 DIAGNOSIS — D8989 Other specified disorders involving the immune mechanism, not elsewhere classified: Secondary | ICD-10-CM | POA: Diagnosis not present

## 2020-02-11 DIAGNOSIS — Z4822 Encounter for aftercare following kidney transplant: Secondary | ICD-10-CM | POA: Diagnosis not present

## 2020-02-11 DIAGNOSIS — E875 Hyperkalemia: Secondary | ICD-10-CM | POA: Diagnosis not present

## 2020-02-11 DIAGNOSIS — N2581 Secondary hyperparathyroidism of renal origin: Secondary | ICD-10-CM | POA: Diagnosis not present

## 2020-02-11 DIAGNOSIS — Z940 Kidney transplant status: Secondary | ICD-10-CM | POA: Diagnosis not present

## 2020-02-11 DIAGNOSIS — F840 Autistic disorder: Secondary | ICD-10-CM | POA: Diagnosis not present

## 2020-02-11 DIAGNOSIS — D649 Anemia, unspecified: Secondary | ICD-10-CM | POA: Diagnosis not present

## 2020-02-11 DIAGNOSIS — I10 Essential (primary) hypertension: Secondary | ICD-10-CM | POA: Diagnosis not present

## 2020-02-11 DIAGNOSIS — N281 Cyst of kidney, acquired: Secondary | ICD-10-CM | POA: Diagnosis not present

## 2020-02-11 DIAGNOSIS — Q6101 Congenital single renal cyst: Secondary | ICD-10-CM | POA: Diagnosis not present

## 2020-02-11 DIAGNOSIS — Z792 Long term (current) use of antibiotics: Secondary | ICD-10-CM | POA: Diagnosis not present

## 2020-02-11 DIAGNOSIS — Z7952 Long term (current) use of systemic steroids: Secondary | ICD-10-CM | POA: Diagnosis not present

## 2020-02-18 ENCOUNTER — Telehealth: Payer: Self-pay | Admitting: Cardiology

## 2020-02-18 DIAGNOSIS — Z940 Kidney transplant status: Secondary | ICD-10-CM | POA: Diagnosis not present

## 2020-02-18 DIAGNOSIS — Z79899 Other long term (current) drug therapy: Secondary | ICD-10-CM | POA: Diagnosis not present

## 2020-02-18 DIAGNOSIS — R079 Chest pain, unspecified: Secondary | ICD-10-CM

## 2020-02-18 NOTE — Telephone Encounter (Signed)
Left a message for the patient to call back regarding her chest pain.

## 2020-02-18 NOTE — Telephone Encounter (Signed)
Pt's mom called and stated that the patient is still complaining of chest pains. Saw Dr. Quentin Ore on 01/29/20 and said next step would to do a stress test. Would like for stress test to be ordered. Please call back

## 2020-02-19 NOTE — Telephone Encounter (Signed)
The patient does not have active CP right now, but would like to proceed with stress test. Confirmed with Dr. Quentin Ore GXT is to be ordered. The patient and her mother understand they will make 2 appointments when the schedulers call: Covid test and GXT. They understand the patient cannot eat or drink (except for water) for 3 hours prior to the test. She understands to hold her Lasix. She understands to wear tennis shoes and comfortable 2 piece outfit, including underwire free bra.  The patient and her mom were grateful for assistance.

## 2020-02-19 NOTE — Addendum Note (Signed)
Addended by: Harland German A on: 02/19/2020 08:53 AM   Modules accepted: Orders

## 2020-02-19 NOTE — Telephone Encounter (Signed)
Patient is returning call.  °

## 2020-02-20 ENCOUNTER — Ambulatory Visit: Payer: Medicaid Other | Admitting: Interventional Cardiology

## 2020-02-22 DIAGNOSIS — F71 Moderate intellectual disabilities: Secondary | ICD-10-CM | POA: Diagnosis not present

## 2020-02-22 DIAGNOSIS — F422 Mixed obsessional thoughts and acts: Secondary | ICD-10-CM | POA: Diagnosis not present

## 2020-02-22 DIAGNOSIS — Z4822 Encounter for aftercare following kidney transplant: Secondary | ICD-10-CM | POA: Diagnosis not present

## 2020-02-22 DIAGNOSIS — F319 Bipolar disorder, unspecified: Secondary | ICD-10-CM | POA: Diagnosis not present

## 2020-02-22 DIAGNOSIS — D708 Other neutropenia: Secondary | ICD-10-CM | POA: Diagnosis not present

## 2020-02-25 DIAGNOSIS — Z4822 Encounter for aftercare following kidney transplant: Secondary | ICD-10-CM | POA: Diagnosis not present

## 2020-02-25 DIAGNOSIS — D708 Other neutropenia: Secondary | ICD-10-CM | POA: Diagnosis not present

## 2020-02-25 DIAGNOSIS — Z79899 Other long term (current) drug therapy: Secondary | ICD-10-CM | POA: Diagnosis not present

## 2020-02-25 DIAGNOSIS — D849 Immunodeficiency, unspecified: Secondary | ICD-10-CM | POA: Diagnosis not present

## 2020-02-25 DIAGNOSIS — Z940 Kidney transplant status: Secondary | ICD-10-CM | POA: Diagnosis not present

## 2020-02-25 DIAGNOSIS — N2889 Other specified disorders of kidney and ureter: Secondary | ICD-10-CM | POA: Diagnosis not present

## 2020-02-25 DIAGNOSIS — I151 Hypertension secondary to other renal disorders: Secondary | ICD-10-CM | POA: Diagnosis not present

## 2020-02-27 ENCOUNTER — Other Ambulatory Visit: Payer: Self-pay

## 2020-02-27 DIAGNOSIS — E039 Hypothyroidism, unspecified: Secondary | ICD-10-CM

## 2020-02-27 MED ORDER — LEVOTHYROXINE SODIUM 112 MCG PO TABS: ORAL_TABLET | ORAL | 3 refills | Status: DC

## 2020-03-03 DIAGNOSIS — Z940 Kidney transplant status: Secondary | ICD-10-CM | POA: Diagnosis not present

## 2020-03-03 DIAGNOSIS — D849 Immunodeficiency, unspecified: Secondary | ICD-10-CM | POA: Diagnosis not present

## 2020-03-10 DIAGNOSIS — F840 Autistic disorder: Secondary | ICD-10-CM | POA: Diagnosis not present

## 2020-03-10 DIAGNOSIS — Z940 Kidney transplant status: Secondary | ICD-10-CM | POA: Diagnosis not present

## 2020-03-10 DIAGNOSIS — D849 Immunodeficiency, unspecified: Secondary | ICD-10-CM | POA: Diagnosis not present

## 2020-03-10 DIAGNOSIS — I151 Hypertension secondary to other renal disorders: Secondary | ICD-10-CM | POA: Diagnosis not present

## 2020-03-10 DIAGNOSIS — I10 Essential (primary) hypertension: Secondary | ICD-10-CM | POA: Diagnosis not present

## 2020-03-10 DIAGNOSIS — D708 Other neutropenia: Secondary | ICD-10-CM | POA: Diagnosis not present

## 2020-03-10 DIAGNOSIS — D72819 Decreased white blood cell count, unspecified: Secondary | ICD-10-CM | POA: Diagnosis not present

## 2020-03-10 DIAGNOSIS — D649 Anemia, unspecified: Secondary | ICD-10-CM | POA: Diagnosis not present

## 2020-03-10 DIAGNOSIS — N2581 Secondary hyperparathyroidism of renal origin: Secondary | ICD-10-CM | POA: Diagnosis not present

## 2020-03-10 DIAGNOSIS — Z7952 Long term (current) use of systemic steroids: Secondary | ICD-10-CM | POA: Diagnosis not present

## 2020-03-10 DIAGNOSIS — Z4822 Encounter for aftercare following kidney transplant: Secondary | ICD-10-CM | POA: Diagnosis not present

## 2020-03-10 DIAGNOSIS — E875 Hyperkalemia: Secondary | ICD-10-CM | POA: Diagnosis not present

## 2020-03-10 DIAGNOSIS — N281 Cyst of kidney, acquired: Secondary | ICD-10-CM | POA: Diagnosis not present

## 2020-03-10 DIAGNOSIS — N051 Unspecified nephritic syndrome with focal and segmental glomerular lesions: Secondary | ICD-10-CM | POA: Diagnosis not present

## 2020-03-10 DIAGNOSIS — Z79899 Other long term (current) drug therapy: Secondary | ICD-10-CM | POA: Diagnosis not present

## 2020-03-10 DIAGNOSIS — N2889 Other specified disorders of kidney and ureter: Secondary | ICD-10-CM | POA: Diagnosis not present

## 2020-03-10 DIAGNOSIS — Z792 Long term (current) use of antibiotics: Secondary | ICD-10-CM | POA: Diagnosis not present

## 2020-03-10 DIAGNOSIS — Z5181 Encounter for therapeutic drug level monitoring: Secondary | ICD-10-CM | POA: Diagnosis not present

## 2020-03-11 DIAGNOSIS — F422 Mixed obsessional thoughts and acts: Secondary | ICD-10-CM | POA: Diagnosis not present

## 2020-03-11 DIAGNOSIS — F319 Bipolar disorder, unspecified: Secondary | ICD-10-CM | POA: Diagnosis not present

## 2020-03-11 DIAGNOSIS — F71 Moderate intellectual disabilities: Secondary | ICD-10-CM | POA: Diagnosis not present

## 2020-03-11 DIAGNOSIS — F329 Major depressive disorder, single episode, unspecified: Secondary | ICD-10-CM | POA: Diagnosis not present

## 2020-03-17 DIAGNOSIS — D849 Immunodeficiency, unspecified: Secondary | ICD-10-CM | POA: Diagnosis not present

## 2020-03-17 DIAGNOSIS — Z940 Kidney transplant status: Secondary | ICD-10-CM | POA: Diagnosis not present

## 2020-03-21 ENCOUNTER — Other Ambulatory Visit (HOSPITAL_COMMUNITY): Payer: Medicare Other

## 2020-03-25 DIAGNOSIS — Z4822 Encounter for aftercare following kidney transplant: Secondary | ICD-10-CM | POA: Diagnosis not present

## 2020-03-25 DIAGNOSIS — D849 Immunodeficiency, unspecified: Secondary | ICD-10-CM | POA: Diagnosis not present

## 2020-03-25 DIAGNOSIS — Z5181 Encounter for therapeutic drug level monitoring: Secondary | ICD-10-CM | POA: Diagnosis not present

## 2020-03-25 DIAGNOSIS — D708 Other neutropenia: Secondary | ICD-10-CM | POA: Diagnosis not present

## 2020-03-25 DIAGNOSIS — Z940 Kidney transplant status: Secondary | ICD-10-CM | POA: Diagnosis not present

## 2020-03-25 DIAGNOSIS — Z79899 Other long term (current) drug therapy: Secondary | ICD-10-CM | POA: Diagnosis not present

## 2020-03-25 DIAGNOSIS — R509 Fever, unspecified: Secondary | ICD-10-CM | POA: Diagnosis not present

## 2020-03-26 ENCOUNTER — Telehealth: Payer: Self-pay | Admitting: Cardiology

## 2020-03-26 DIAGNOSIS — R112 Nausea with vomiting, unspecified: Secondary | ICD-10-CM | POA: Diagnosis present

## 2020-03-26 DIAGNOSIS — F840 Autistic disorder: Secondary | ICD-10-CM | POA: Diagnosis present

## 2020-03-26 DIAGNOSIS — I10 Essential (primary) hypertension: Secondary | ICD-10-CM | POA: Diagnosis present

## 2020-03-26 DIAGNOSIS — F79 Unspecified intellectual disabilities: Secondary | ICD-10-CM | POA: Diagnosis present

## 2020-03-26 DIAGNOSIS — Z940 Kidney transplant status: Secondary | ICD-10-CM | POA: Diagnosis not present

## 2020-03-26 DIAGNOSIS — B370 Candidal stomatitis: Secondary | ICD-10-CM | POA: Diagnosis not present

## 2020-03-26 DIAGNOSIS — D84821 Immunodeficiency due to drugs: Secondary | ICD-10-CM | POA: Diagnosis present

## 2020-03-26 DIAGNOSIS — R1310 Dysphagia, unspecified: Secondary | ICD-10-CM | POA: Diagnosis present

## 2020-03-26 DIAGNOSIS — E039 Hypothyroidism, unspecified: Secondary | ICD-10-CM | POA: Diagnosis present

## 2020-03-26 DIAGNOSIS — K219 Gastro-esophageal reflux disease without esophagitis: Secondary | ICD-10-CM | POA: Diagnosis present

## 2020-03-26 DIAGNOSIS — F39 Unspecified mood [affective] disorder: Secondary | ICD-10-CM | POA: Diagnosis present

## 2020-03-26 DIAGNOSIS — N186 End stage renal disease: Secondary | ICD-10-CM | POA: Diagnosis not present

## 2020-03-26 DIAGNOSIS — Z833 Family history of diabetes mellitus: Secondary | ICD-10-CM | POA: Diagnosis not present

## 2020-03-26 DIAGNOSIS — N051 Unspecified nephritic syndrome with focal and segmental glomerular lesions: Secondary | ICD-10-CM | POA: Diagnosis not present

## 2020-03-26 DIAGNOSIS — D72819 Decreased white blood cell count, unspecified: Secondary | ICD-10-CM | POA: Diagnosis present

## 2020-03-26 DIAGNOSIS — J9811 Atelectasis: Secondary | ICD-10-CM | POA: Diagnosis not present

## 2020-03-26 DIAGNOSIS — L02215 Cutaneous abscess of perineum: Secondary | ICD-10-CM | POA: Diagnosis not present

## 2020-03-26 DIAGNOSIS — R509 Fever, unspecified: Secondary | ICD-10-CM | POA: Diagnosis not present

## 2020-03-26 DIAGNOSIS — D849 Immunodeficiency, unspecified: Secondary | ICD-10-CM | POA: Diagnosis not present

## 2020-03-26 DIAGNOSIS — F419 Anxiety disorder, unspecified: Secondary | ICD-10-CM | POA: Diagnosis present

## 2020-03-26 NOTE — Telephone Encounter (Signed)
The patient's mother is calling to reschedule the patient's ETT. Per her request it has been canceled at this time and she is awaiting a callback to reschedule. Please advise.

## 2020-03-28 ENCOUNTER — Other Ambulatory Visit (HOSPITAL_COMMUNITY): Payer: Medicare Other

## 2020-04-01 DIAGNOSIS — D849 Immunodeficiency, unspecified: Secondary | ICD-10-CM | POA: Diagnosis not present

## 2020-04-01 DIAGNOSIS — E039 Hypothyroidism, unspecified: Secondary | ICD-10-CM | POA: Diagnosis not present

## 2020-04-01 DIAGNOSIS — Z940 Kidney transplant status: Secondary | ICD-10-CM | POA: Diagnosis not present

## 2020-04-01 DIAGNOSIS — N2581 Secondary hyperparathyroidism of renal origin: Secondary | ICD-10-CM | POA: Diagnosis not present

## 2020-04-01 DIAGNOSIS — Z4822 Encounter for aftercare following kidney transplant: Secondary | ICD-10-CM | POA: Diagnosis not present

## 2020-04-01 DIAGNOSIS — Z7952 Long term (current) use of systemic steroids: Secondary | ICD-10-CM | POA: Diagnosis not present

## 2020-04-01 DIAGNOSIS — I10 Essential (primary) hypertension: Secondary | ICD-10-CM | POA: Diagnosis not present

## 2020-04-01 DIAGNOSIS — F79 Unspecified intellectual disabilities: Secondary | ICD-10-CM | POA: Diagnosis not present

## 2020-04-01 DIAGNOSIS — D708 Other neutropenia: Secondary | ICD-10-CM | POA: Diagnosis not present

## 2020-04-01 DIAGNOSIS — I151 Hypertension secondary to other renal disorders: Secondary | ICD-10-CM | POA: Diagnosis not present

## 2020-04-01 DIAGNOSIS — F419 Anxiety disorder, unspecified: Secondary | ICD-10-CM | POA: Diagnosis not present

## 2020-04-01 DIAGNOSIS — Z79899 Other long term (current) drug therapy: Secondary | ICD-10-CM | POA: Diagnosis not present

## 2020-04-01 DIAGNOSIS — K219 Gastro-esophageal reflux disease without esophagitis: Secondary | ICD-10-CM | POA: Diagnosis not present

## 2020-04-01 DIAGNOSIS — F840 Autistic disorder: Secondary | ICD-10-CM | POA: Diagnosis not present

## 2020-04-01 DIAGNOSIS — R509 Fever, unspecified: Secondary | ICD-10-CM | POA: Diagnosis not present

## 2020-04-01 DIAGNOSIS — N2889 Other specified disorders of kidney and ureter: Secondary | ICD-10-CM | POA: Diagnosis not present

## 2020-04-01 DIAGNOSIS — Z9889 Other specified postprocedural states: Secondary | ICD-10-CM | POA: Diagnosis not present

## 2020-04-01 DIAGNOSIS — Z862 Personal history of diseases of the blood and blood-forming organs and certain disorders involving the immune mechanism: Secondary | ICD-10-CM | POA: Diagnosis not present

## 2020-04-01 DIAGNOSIS — N281 Cyst of kidney, acquired: Secondary | ICD-10-CM | POA: Diagnosis not present

## 2020-04-01 DIAGNOSIS — F39 Unspecified mood [affective] disorder: Secondary | ICD-10-CM | POA: Diagnosis not present

## 2020-04-01 DIAGNOSIS — D649 Anemia, unspecified: Secondary | ICD-10-CM | POA: Diagnosis not present

## 2020-04-04 DIAGNOSIS — F319 Bipolar disorder, unspecified: Secondary | ICD-10-CM | POA: Diagnosis not present

## 2020-04-04 DIAGNOSIS — F71 Moderate intellectual disabilities: Secondary | ICD-10-CM | POA: Diagnosis not present

## 2020-04-04 DIAGNOSIS — F422 Mixed obsessional thoughts and acts: Secondary | ICD-10-CM | POA: Diagnosis not present

## 2020-04-04 DIAGNOSIS — F329 Major depressive disorder, single episode, unspecified: Secondary | ICD-10-CM | POA: Diagnosis not present

## 2020-04-07 DIAGNOSIS — F419 Anxiety disorder, unspecified: Secondary | ICD-10-CM | POA: Diagnosis not present

## 2020-04-07 DIAGNOSIS — D649 Anemia, unspecified: Secondary | ICD-10-CM | POA: Diagnosis not present

## 2020-04-07 DIAGNOSIS — F79 Unspecified intellectual disabilities: Secondary | ICD-10-CM | POA: Diagnosis not present

## 2020-04-07 DIAGNOSIS — R509 Fever, unspecified: Secondary | ICD-10-CM | POA: Diagnosis not present

## 2020-04-07 DIAGNOSIS — I151 Hypertension secondary to other renal disorders: Secondary | ICD-10-CM | POA: Diagnosis not present

## 2020-04-07 DIAGNOSIS — Z7952 Long term (current) use of systemic steroids: Secondary | ICD-10-CM | POA: Diagnosis not present

## 2020-04-07 DIAGNOSIS — F39 Unspecified mood [affective] disorder: Secondary | ICD-10-CM | POA: Diagnosis not present

## 2020-04-07 DIAGNOSIS — D849 Immunodeficiency, unspecified: Secondary | ICD-10-CM | POA: Diagnosis not present

## 2020-04-07 DIAGNOSIS — I10 Essential (primary) hypertension: Secondary | ICD-10-CM | POA: Diagnosis not present

## 2020-04-07 DIAGNOSIS — Z9889 Other specified postprocedural states: Secondary | ICD-10-CM | POA: Diagnosis not present

## 2020-04-07 DIAGNOSIS — D72819 Decreased white blood cell count, unspecified: Secondary | ICD-10-CM | POA: Diagnosis not present

## 2020-04-07 DIAGNOSIS — N2581 Secondary hyperparathyroidism of renal origin: Secondary | ICD-10-CM | POA: Diagnosis not present

## 2020-04-07 DIAGNOSIS — Z79899 Other long term (current) drug therapy: Secondary | ICD-10-CM | POA: Diagnosis not present

## 2020-04-07 DIAGNOSIS — Z4822 Encounter for aftercare following kidney transplant: Secondary | ICD-10-CM | POA: Diagnosis not present

## 2020-04-07 DIAGNOSIS — N2889 Other specified disorders of kidney and ureter: Secondary | ICD-10-CM | POA: Diagnosis not present

## 2020-04-07 DIAGNOSIS — F840 Autistic disorder: Secondary | ICD-10-CM | POA: Diagnosis not present

## 2020-04-07 DIAGNOSIS — E039 Hypothyroidism, unspecified: Secondary | ICD-10-CM | POA: Diagnosis not present

## 2020-04-07 DIAGNOSIS — N281 Cyst of kidney, acquired: Secondary | ICD-10-CM | POA: Diagnosis not present

## 2020-04-07 DIAGNOSIS — K219 Gastro-esophageal reflux disease without esophagitis: Secondary | ICD-10-CM | POA: Diagnosis not present

## 2020-04-07 DIAGNOSIS — D708 Other neutropenia: Secondary | ICD-10-CM | POA: Diagnosis not present

## 2020-04-07 DIAGNOSIS — Z940 Kidney transplant status: Secondary | ICD-10-CM | POA: Diagnosis not present

## 2020-04-08 DIAGNOSIS — F329 Major depressive disorder, single episode, unspecified: Secondary | ICD-10-CM | POA: Diagnosis not present

## 2020-04-08 DIAGNOSIS — F319 Bipolar disorder, unspecified: Secondary | ICD-10-CM | POA: Diagnosis not present

## 2020-04-08 DIAGNOSIS — F71 Moderate intellectual disabilities: Secondary | ICD-10-CM | POA: Diagnosis not present

## 2020-04-11 DIAGNOSIS — T82858A Stenosis of vascular prosthetic devices, implants and grafts, initial encounter: Secondary | ICD-10-CM | POA: Diagnosis not present

## 2020-04-11 DIAGNOSIS — N186 End stage renal disease: Secondary | ICD-10-CM | POA: Diagnosis not present

## 2020-04-11 DIAGNOSIS — I871 Compression of vein: Secondary | ICD-10-CM | POA: Diagnosis not present

## 2020-04-11 DIAGNOSIS — Z992 Dependence on renal dialysis: Secondary | ICD-10-CM | POA: Diagnosis not present

## 2020-04-14 DIAGNOSIS — Z940 Kidney transplant status: Secondary | ICD-10-CM | POA: Diagnosis not present

## 2020-04-14 DIAGNOSIS — D849 Immunodeficiency, unspecified: Secondary | ICD-10-CM | POA: Diagnosis not present

## 2020-04-21 DIAGNOSIS — F419 Anxiety disorder, unspecified: Secondary | ICD-10-CM | POA: Diagnosis not present

## 2020-04-21 DIAGNOSIS — F840 Autistic disorder: Secondary | ICD-10-CM | POA: Diagnosis not present

## 2020-04-21 DIAGNOSIS — Z4822 Encounter for aftercare following kidney transplant: Secondary | ICD-10-CM | POA: Diagnosis not present

## 2020-04-21 DIAGNOSIS — N281 Cyst of kidney, acquired: Secondary | ICD-10-CM | POA: Diagnosis not present

## 2020-04-21 DIAGNOSIS — D649 Anemia, unspecified: Secondary | ICD-10-CM | POA: Diagnosis not present

## 2020-04-21 DIAGNOSIS — Z7952 Long term (current) use of systemic steroids: Secondary | ICD-10-CM | POA: Diagnosis not present

## 2020-04-21 DIAGNOSIS — D849 Immunodeficiency, unspecified: Secondary | ICD-10-CM | POA: Diagnosis not present

## 2020-04-21 DIAGNOSIS — I10 Essential (primary) hypertension: Secondary | ICD-10-CM | POA: Diagnosis not present

## 2020-04-21 DIAGNOSIS — D72819 Decreased white blood cell count, unspecified: Secondary | ICD-10-CM | POA: Diagnosis not present

## 2020-04-21 DIAGNOSIS — E039 Hypothyroidism, unspecified: Secondary | ICD-10-CM | POA: Diagnosis not present

## 2020-04-21 DIAGNOSIS — I151 Hypertension secondary to other renal disorders: Secondary | ICD-10-CM | POA: Diagnosis not present

## 2020-04-21 DIAGNOSIS — Z79899 Other long term (current) drug therapy: Secondary | ICD-10-CM | POA: Diagnosis not present

## 2020-04-21 DIAGNOSIS — F39 Unspecified mood [affective] disorder: Secondary | ICD-10-CM | POA: Diagnosis not present

## 2020-04-21 DIAGNOSIS — K219 Gastro-esophageal reflux disease without esophagitis: Secondary | ICD-10-CM | POA: Diagnosis not present

## 2020-04-21 DIAGNOSIS — Z23 Encounter for immunization: Secondary | ICD-10-CM | POA: Diagnosis not present

## 2020-04-21 DIAGNOSIS — N2889 Other specified disorders of kidney and ureter: Secondary | ICD-10-CM | POA: Diagnosis not present

## 2020-04-21 DIAGNOSIS — Z940 Kidney transplant status: Secondary | ICD-10-CM | POA: Diagnosis not present

## 2020-04-21 DIAGNOSIS — N2581 Secondary hyperparathyroidism of renal origin: Secondary | ICD-10-CM | POA: Diagnosis not present

## 2020-04-21 DIAGNOSIS — F79 Unspecified intellectual disabilities: Secondary | ICD-10-CM | POA: Diagnosis not present

## 2020-04-29 DIAGNOSIS — Z940 Kidney transplant status: Secondary | ICD-10-CM | POA: Diagnosis not present

## 2020-04-29 DIAGNOSIS — R011 Cardiac murmur, unspecified: Secondary | ICD-10-CM | POA: Diagnosis not present

## 2020-04-29 DIAGNOSIS — D849 Immunodeficiency, unspecified: Secondary | ICD-10-CM | POA: Diagnosis not present

## 2020-04-30 ENCOUNTER — Encounter: Payer: Self-pay | Admitting: Family Medicine

## 2020-04-30 ENCOUNTER — Ambulatory Visit (INDEPENDENT_AMBULATORY_CARE_PROVIDER_SITE_OTHER): Payer: Medicare Other | Admitting: Family Medicine

## 2020-04-30 ENCOUNTER — Other Ambulatory Visit: Payer: Self-pay

## 2020-04-30 VITALS — BP 126/82 | HR 100 | Wt 164.6 lb

## 2020-04-30 DIAGNOSIS — D509 Iron deficiency anemia, unspecified: Secondary | ICD-10-CM

## 2020-04-30 DIAGNOSIS — R519 Headache, unspecified: Secondary | ICD-10-CM | POA: Diagnosis not present

## 2020-04-30 DIAGNOSIS — R112 Nausea with vomiting, unspecified: Secondary | ICD-10-CM | POA: Diagnosis not present

## 2020-04-30 DIAGNOSIS — J029 Acute pharyngitis, unspecified: Secondary | ICD-10-CM | POA: Insufficient documentation

## 2020-04-30 MED ORDER — NYSTATIN 100000 UNIT/ML MT SUSP: 5.0000 mL | Freq: Four times a day (QID) | OROMUCOSAL | 0 refills | Status: DC

## 2020-04-30 NOTE — Progress Notes (Signed)
    SUBJECTIVE:   CHIEF COMPLAINT / HPI:   Sore throat, headache, nausea, vomiting Hurting on left side anytime she eats or swallows. No pain at rest or when talking. Patient unable to give definitive answer for duration, but has been happening for "months", since sometime before Thanksgiving but not too close to her kidney transplant in August. Mom reports fevers right after Thanksgiving, treated with antibiotics from transplant doctors in [facility], fevers resolved quickly. Per transplant team recommendations, saw infectious disease doctor yesterday who didn't have a specific diagnosis of infection. At the time, she did not tell ID about sore throat and was not experiencing nausea, vomiting, or headache. No congestion or runny nose.  Headache started last night, frontotemporal, not responsive to tylenol; no photophobia or phonophobia. Some nausea, vomiting x1 last night as well. HA preceded emesis. No diarrhea or constipation. Urinating normally, urine light color. No muscle aches or fatigue. Lives at home with mom. No known sick contacts, no known COVID contacts. No difficulty breathing.   PERTINENT  PMH / PSH: ESRD and FSGS s/p renal transplant August 2021, HTN, hypothyroidism, iron deficiency anemia, obesity, intellectual disability, nystagmus, affective disorder, headache  OBJECTIVE:   BP 126/82   Pulse 100   Wt 164 lb 9.6 oz (74.7 kg)   SpO2 98%   BMI 28.25 kg/m    PHQ-9:  Depression screen [facility] 2/9 04/30/2020 02/04/2020 08/22/2019  Decreased Interest 0 0 0  Down, Depressed, Hopeless 0 0 0  PHQ - 2 Score 0 0 0  Altered sleeping 0 0 -  Tired, decreased energy 0 0 -  Change in appetite 0 0 -  Feeling bad or failure about yourself  0 0 -  Trouble concentrating 0 0 -  Moving slowly or fidgety/restless 0 0 -  Suicidal thoughts 0 0 -  PHQ-9 Score 0 0 -  Difficult doing work/chores Not difficult at all - -  Some recent data might be hidden     GAD-7: No flowsheet data found.     Physical Exam General: Awake, alert, oriented, no acute distress HEENT: Intact dentition without obvious caries, moderate plaque burden on tongue, oral mucosa normal pink and moist, no tonsillar enlargement or exudate noted, posterior oropharynx without erythema, erythematous nasal mucosa with crusting at nares Cardiovascular: Regular rate and rhythm, S1 and S2 present, no murmurs auscultated Respiratory: Lung fields clear to auscultation bilaterally, normal work of breathing, no respiratory distress Neuro: Cranial nerves II through X grossly intact, able to move all extremities spontaneously   ASSESSMENT/PLAN:   Sore throat Given immunocompromised state (s/p renal transplant August 2021) and lengthy duration, likely thrush.  Physical exam unremarkable.  Nystatin solution sent to pharmacy.  Return precautions given.  Nausea & vomiting Given new onset nausea, vomiting, headache (1 day) in addition to sore throat (approx 3-4 mon) with immunocompromised state, swabbed for Covid.  Send out lab, should result in 24 to 48 hours.     Ezequiel Essex, MD [place]

## 2020-04-30 NOTE — Patient Instructions (Signed)
It was wonderful to meet you today. Thank you for allowing me to be a part of your care. Below is a short summary of what we discussed at your visit today:  Sore throat, nausea, vomiting, headache Today you were swabbed for Covid.  This test will be sent out to the lab, we expect to result in 24 to 48 hours.  If the result is negative, I will send you a MyChart message.  If the result is positive, I will give you a call with your results, instructions, and next steps.  For the sore throat, I have sent a prescription for nystatin swish and swallow solution to your pharmacy.  While I did not see out right evidence of thrush, this is most likely what is causing your sore throat.  Please use as directed.  I am relieved by the fact that the sore throat is not interfering with your breathing, you have no fever, and you are still eating and drinking well.  If you start having fevers, or are unable to tolerate food and water, please call us back and let us know so we can get you in for another appointment or call in different medicine.  If you start having difficulty breathing please seek medical attention as soon as possible.   If you have any questions or concerns, please do not hesitate to contact us via phone or MyChart message.   Ezequiel Essex, MD

## 2020-04-30 NOTE — Assessment & Plan Note (Signed)
Given immunocompromised state (s/p renal transplant August 2021) and lengthy duration, likely thrush.  Physical exam unremarkable.  Nystatin solution sent to pharmacy.  Return precautions given.

## 2020-04-30 NOTE — Assessment & Plan Note (Signed)
Given new onset nausea, vomiting, headache (1 day) in addition to sore throat (approx 3-4 mon) with immunocompromised state, swabbed for Covid.  Send out lab, should result in 24 to 48 hours.

## 2020-05-02 ENCOUNTER — Telehealth: Payer: Self-pay | Admitting: Family Medicine

## 2020-05-02 DIAGNOSIS — F319 Bipolar disorder, unspecified: Secondary | ICD-10-CM | POA: Diagnosis not present

## 2020-05-02 DIAGNOSIS — F71 Moderate intellectual disabilities: Secondary | ICD-10-CM | POA: Diagnosis not present

## 2020-05-02 LAB — NOVEL CORONAVIRUS, NAA: SARS-CoV-2, NAA: NOT DETECTED

## 2020-05-02 LAB — SARS-COV-2, NAA 2 DAY TAT

## 2020-05-02 NOTE — Telephone Encounter (Signed)
Called to notify of negative COVID swab. Spoke with mother. Advised to continue nystatin swish-and-swallow solution for sore throat likely 2/2 oropharyngeal candidiasis.   Ezequiel Essex, MD

## 2020-05-05 DIAGNOSIS — Z7952 Long term (current) use of systemic steroids: Secondary | ICD-10-CM | POA: Diagnosis not present

## 2020-05-05 DIAGNOSIS — Z792 Long term (current) use of antibiotics: Secondary | ICD-10-CM | POA: Diagnosis not present

## 2020-05-05 DIAGNOSIS — Z79899 Other long term (current) drug therapy: Secondary | ICD-10-CM | POA: Diagnosis not present

## 2020-05-05 DIAGNOSIS — I10 Essential (primary) hypertension: Secondary | ICD-10-CM | POA: Diagnosis not present

## 2020-05-05 DIAGNOSIS — D649 Anemia, unspecified: Secondary | ICD-10-CM | POA: Diagnosis not present

## 2020-05-05 DIAGNOSIS — N2581 Secondary hyperparathyroidism of renal origin: Secondary | ICD-10-CM | POA: Diagnosis not present

## 2020-05-05 DIAGNOSIS — I151 Hypertension secondary to other renal disorders: Secondary | ICD-10-CM | POA: Diagnosis not present

## 2020-05-05 DIAGNOSIS — N281 Cyst of kidney, acquired: Secondary | ICD-10-CM | POA: Diagnosis not present

## 2020-05-05 DIAGNOSIS — D72819 Decreased white blood cell count, unspecified: Secondary | ICD-10-CM | POA: Diagnosis not present

## 2020-05-05 DIAGNOSIS — N2889 Other specified disorders of kidney and ureter: Secondary | ICD-10-CM | POA: Diagnosis not present

## 2020-05-05 DIAGNOSIS — Z4822 Encounter for aftercare following kidney transplant: Secondary | ICD-10-CM | POA: Diagnosis not present

## 2020-05-05 DIAGNOSIS — D849 Immunodeficiency, unspecified: Secondary | ICD-10-CM | POA: Diagnosis not present

## 2020-05-05 DIAGNOSIS — R109 Unspecified abdominal pain: Secondary | ICD-10-CM | POA: Diagnosis not present

## 2020-05-05 DIAGNOSIS — R079 Chest pain, unspecified: Secondary | ICD-10-CM | POA: Diagnosis not present

## 2020-05-05 DIAGNOSIS — Z940 Kidney transplant status: Secondary | ICD-10-CM | POA: Diagnosis not present

## 2020-05-09 ENCOUNTER — Other Ambulatory Visit: Payer: Self-pay | Admitting: Cardiology

## 2020-05-09 ENCOUNTER — Telehealth (HOSPITAL_COMMUNITY): Payer: Self-pay | Admitting: *Deleted

## 2020-05-09 ENCOUNTER — Other Ambulatory Visit (HOSPITAL_COMMUNITY)
Admission: RE | Admit: 2020-05-09 | Discharge: 2020-05-09 | Disposition: A | Discharge status: Home / Self Care | Payer: Medicare Other | Source: Ambulatory Visit | Attending: Cardiology | Admitting: Cardiology

## 2020-05-09 DIAGNOSIS — Z01812 Encounter for preprocedural laboratory examination: Secondary | ICD-10-CM | POA: Diagnosis not present

## 2020-05-09 DIAGNOSIS — Z20822 Contact with and (suspected) exposure to covid-19: Secondary | ICD-10-CM | POA: Insufficient documentation

## 2020-05-09 LAB — SARS CORONAVIRUS 2 (TAT 6-24 HRS): SARS Coronavirus 2: NEGATIVE

## 2020-05-09 NOTE — Telephone Encounter (Signed)
Close encounter 

## 2020-05-13 ENCOUNTER — Inpatient Hospital Stay (HOSPITAL_COMMUNITY): Admission: RE | Admit: 2020-05-13 | Payer: Medicare Other | Source: Ambulatory Visit

## 2020-05-14 ENCOUNTER — Telehealth: Payer: Self-pay

## 2020-05-14 DIAGNOSIS — R079 Chest pain, unspecified: Secondary | ICD-10-CM

## 2020-05-14 NOTE — Telephone Encounter (Signed)
ETT attestation signed.

## 2020-05-15 ENCOUNTER — Ambulatory Visit (HOSPITAL_COMMUNITY)
Admission: RE | Admit: 2020-05-15 | Discharge: 2020-05-15 | Disposition: A | Discharge status: Home / Self Care | Payer: Medicare Other | Source: Ambulatory Visit | Attending: Cardiology | Admitting: Cardiology

## 2020-05-15 ENCOUNTER — Other Ambulatory Visit: Payer: Self-pay

## 2020-05-15 DIAGNOSIS — R079 Chest pain, unspecified: Secondary | ICD-10-CM

## 2020-05-15 LAB — EXERCISE TOLERANCE TEST
Estimated workload: 4.6 METS
Exercise duration (min): 8 min
Exercise duration (sec): 36 s
MPHR: 186 {beats}/min
Peak HR: 139 {beats}/min
Percent HR: 75 %
Rest HR: 108 {beats}/min

## 2020-05-19 DIAGNOSIS — Z940 Kidney transplant status: Secondary | ICD-10-CM | POA: Diagnosis not present

## 2020-05-19 DIAGNOSIS — D849 Immunodeficiency, unspecified: Secondary | ICD-10-CM | POA: Diagnosis not present

## 2020-05-23 DIAGNOSIS — F319 Bipolar disorder, unspecified: Secondary | ICD-10-CM | POA: Diagnosis not present

## 2020-05-23 DIAGNOSIS — F71 Moderate intellectual disabilities: Secondary | ICD-10-CM | POA: Diagnosis not present

## 2020-05-23 DIAGNOSIS — F422 Mixed obsessional thoughts and acts: Secondary | ICD-10-CM | POA: Diagnosis not present

## 2020-05-23 DIAGNOSIS — F329 Major depressive disorder, single episode, unspecified: Secondary | ICD-10-CM | POA: Diagnosis not present

## 2020-06-02 DIAGNOSIS — I10 Essential (primary) hypertension: Secondary | ICD-10-CM | POA: Diagnosis not present

## 2020-06-02 DIAGNOSIS — R011 Cardiac murmur, unspecified: Secondary | ICD-10-CM | POA: Diagnosis not present

## 2020-06-02 DIAGNOSIS — Z79899 Other long term (current) drug therapy: Secondary | ICD-10-CM | POA: Diagnosis not present

## 2020-06-02 DIAGNOSIS — D708 Other neutropenia: Secondary | ICD-10-CM | POA: Diagnosis not present

## 2020-06-02 DIAGNOSIS — I340 Nonrheumatic mitral (valve) insufficiency: Secondary | ICD-10-CM | POA: Diagnosis not present

## 2020-06-02 DIAGNOSIS — N2581 Secondary hyperparathyroidism of renal origin: Secondary | ICD-10-CM | POA: Diagnosis not present

## 2020-06-02 DIAGNOSIS — Z940 Kidney transplant status: Secondary | ICD-10-CM | POA: Diagnosis not present

## 2020-06-02 DIAGNOSIS — D72819 Decreased white blood cell count, unspecified: Secondary | ICD-10-CM | POA: Diagnosis not present

## 2020-06-02 DIAGNOSIS — Z7952 Long term (current) use of systemic steroids: Secondary | ICD-10-CM | POA: Diagnosis not present

## 2020-06-02 DIAGNOSIS — R079 Chest pain, unspecified: Secondary | ICD-10-CM | POA: Diagnosis not present

## 2020-06-02 DIAGNOSIS — Z7982 Long term (current) use of aspirin: Secondary | ICD-10-CM | POA: Diagnosis not present

## 2020-06-02 DIAGNOSIS — D649 Anemia, unspecified: Secondary | ICD-10-CM | POA: Diagnosis not present

## 2020-06-02 DIAGNOSIS — R109 Unspecified abdominal pain: Secondary | ICD-10-CM | POA: Diagnosis not present

## 2020-06-02 DIAGNOSIS — Z4822 Encounter for aftercare following kidney transplant: Secondary | ICD-10-CM | POA: Diagnosis not present

## 2020-06-02 DIAGNOSIS — I150 Renovascular hypertension: Secondary | ICD-10-CM | POA: Diagnosis not present

## 2020-06-02 DIAGNOSIS — D849 Immunodeficiency, unspecified: Secondary | ICD-10-CM | POA: Diagnosis not present

## 2020-06-02 DIAGNOSIS — I517 Cardiomegaly: Secondary | ICD-10-CM | POA: Diagnosis not present

## 2020-06-05 DIAGNOSIS — D849 Immunodeficiency, unspecified: Secondary | ICD-10-CM | POA: Diagnosis not present

## 2020-06-05 DIAGNOSIS — Z940 Kidney transplant status: Secondary | ICD-10-CM | POA: Diagnosis not present

## 2020-06-06 DIAGNOSIS — Z4822 Encounter for aftercare following kidney transplant: Secondary | ICD-10-CM | POA: Diagnosis not present

## 2020-06-06 DIAGNOSIS — D708 Other neutropenia: Secondary | ICD-10-CM | POA: Diagnosis not present

## 2020-06-06 DIAGNOSIS — Z5189 Encounter for other specified aftercare: Secondary | ICD-10-CM | POA: Diagnosis not present

## 2020-06-06 DIAGNOSIS — F319 Bipolar disorder, unspecified: Secondary | ICD-10-CM | POA: Diagnosis not present

## 2020-06-10 DIAGNOSIS — Z5189 Encounter for other specified aftercare: Secondary | ICD-10-CM | POA: Diagnosis not present

## 2020-06-10 DIAGNOSIS — Z4822 Encounter for aftercare following kidney transplant: Secondary | ICD-10-CM | POA: Diagnosis not present

## 2020-06-10 DIAGNOSIS — D708 Other neutropenia: Secondary | ICD-10-CM | POA: Diagnosis not present

## 2020-06-13 ENCOUNTER — Encounter: Payer: Self-pay | Admitting: Family Medicine

## 2020-06-13 ENCOUNTER — Ambulatory Visit (INDEPENDENT_AMBULATORY_CARE_PROVIDER_SITE_OTHER): Payer: Medicare Other | Admitting: Family Medicine

## 2020-06-13 ENCOUNTER — Other Ambulatory Visit: Payer: Self-pay

## 2020-06-13 VITALS — BP 137/87 | HR 90 | Ht 64.0 in | Wt 168.0 lb

## 2020-06-13 DIAGNOSIS — M25551 Pain in right hip: Secondary | ICD-10-CM

## 2020-06-13 DIAGNOSIS — Z23 Encounter for immunization: Secondary | ICD-10-CM | POA: Diagnosis not present

## 2020-06-13 DIAGNOSIS — M25552 Pain in left hip: Secondary | ICD-10-CM

## 2020-06-13 NOTE — Progress Notes (Signed)
    SUBJECTIVE:   CHIEF COMPLAINT / HPI:   Hip pain Patient reports bilateral hip pain that started this past Wednesday, 2 days ago.  She has tried some Tylenol with some relief.  Pain has been overall improving, patient denies any hip pain at this time.  She denies fever and chills.  She is able to ambulate without difficulty.  Of note, mother reports that she recently received filgrastim (G-CSF) injections this past Friday and Tuesday, the day prior to the onset of her hip pain.   Most recent XR right hip 01/2020 unremarkable, no abnormalities seen.  Regarding her murmur, this was noted by infectious disease at [facility].  She has had an echocardiogram which mother states was normal.  PERTINENT  PMH / PSH: Overweight, HTN, FSGS, ESRD on HD s/p renal transplant 11/2019 on immunosuppression  OBJECTIVE:   BP 137/87   Pulse 90   Ht '5\' 4"'$  (1.626 m)   Wt 168 lb (76.2 kg)   LMP 06/12/2020   SpO2 99%   BMI 28.84 kg/m   General: Overweight [place] female, NAD CV: RRR, 3/6 diastolic murmur Pulm: CTAB, no wheezes or rales MSK: no tenderness or obvious effusion to bilateral hip joints, FROM bilateral hips without pain, FABER test negative bilaterally, bilateral knees without tenderness and with FROM, normal gait without pain  ASSESSMENT/PLAN:   Acute bilateral hip pain Suspect hip pain is most likely secondary to adverse reaction to filgrastim injections.  Overall appears to be improving.  No concerning signs and physical exam reassuring.  Consideration for septic arthritis and avascular necrosis given immunosuppression (including being on daily corticosteroids), though low suspicion at this time. - Tylenol prn - return precautions given  HCM - Tdap declined  [name], MD [place]

## 2020-06-13 NOTE — Patient Instructions (Addendum)
It was nice seeing you today!  I think your hip pain is most likely due to the injections that you have been receiving.  Your physical exam was reassuring.  You can continue to take Tylenol as needed for pain.  You can safely take up to 3,000 mg/day.  Please give [place] a call if you develop fever or if you develop significant worsening of your hip pain to the point where you cannot walk.  Stay well, Vanessa Button, MD [place] [phone]

## 2020-06-17 DIAGNOSIS — Z940 Kidney transplant status: Secondary | ICD-10-CM | POA: Diagnosis not present

## 2020-06-17 DIAGNOSIS — D849 Immunodeficiency, unspecified: Secondary | ICD-10-CM | POA: Diagnosis not present

## 2020-06-18 ENCOUNTER — Telehealth: Payer: Self-pay | Admitting: Cardiology

## 2020-06-18 NOTE — Telephone Encounter (Signed)
Pt c/o of Chest Pain: STAT if CP now or developed within 24 hours  1. Are you having CP right now? No   2. Are you experiencing any other symptoms (ex. SOB, nausea, vomiting, sweating)? No   3. How long have you been experiencing CP? A month   4. Is your CP continuous or coming and going? Coming and going   5. Have you taken Nitroglycerin? No   Delila is requesting a sooner appt for Vanessa Alvarez in regards to this. She states it is discomfort not pain.  ?

## 2020-06-18 NOTE — Telephone Encounter (Signed)
Follow up appt rescheduled for 07/08/20.  Pt had negative stress test.  See last OV note by Dr. Quentin Ore.

## 2020-06-20 DIAGNOSIS — F422 Mixed obsessional thoughts and acts: Secondary | ICD-10-CM | POA: Diagnosis not present

## 2020-06-20 DIAGNOSIS — F71 Moderate intellectual disabilities: Secondary | ICD-10-CM | POA: Diagnosis not present

## 2020-06-23 DIAGNOSIS — F71 Moderate intellectual disabilities: Secondary | ICD-10-CM | POA: Diagnosis not present

## 2020-06-23 DIAGNOSIS — F329 Major depressive disorder, single episode, unspecified: Secondary | ICD-10-CM | POA: Diagnosis not present

## 2020-06-23 DIAGNOSIS — F319 Bipolar disorder, unspecified: Secondary | ICD-10-CM | POA: Diagnosis not present

## 2020-06-23 DIAGNOSIS — F422 Mixed obsessional thoughts and acts: Secondary | ICD-10-CM | POA: Diagnosis not present

## 2020-06-24 ENCOUNTER — Other Ambulatory Visit: Payer: Self-pay | Admitting: Family Medicine

## 2020-06-24 DIAGNOSIS — E039 Hypothyroidism, unspecified: Secondary | ICD-10-CM

## 2020-06-24 DIAGNOSIS — Z940 Kidney transplant status: Secondary | ICD-10-CM | POA: Diagnosis not present

## 2020-06-24 DIAGNOSIS — D849 Immunodeficiency, unspecified: Secondary | ICD-10-CM | POA: Diagnosis not present

## 2020-06-30 DIAGNOSIS — Z4822 Encounter for aftercare following kidney transplant: Secondary | ICD-10-CM | POA: Diagnosis not present

## 2020-06-30 DIAGNOSIS — Z940 Kidney transplant status: Secondary | ICD-10-CM | POA: Diagnosis not present

## 2020-06-30 DIAGNOSIS — D849 Immunodeficiency, unspecified: Secondary | ICD-10-CM | POA: Diagnosis not present

## 2020-06-30 DIAGNOSIS — Z79899 Other long term (current) drug therapy: Secondary | ICD-10-CM | POA: Diagnosis not present

## 2020-06-30 DIAGNOSIS — I150 Renovascular hypertension: Secondary | ICD-10-CM | POA: Diagnosis not present

## 2020-06-30 DIAGNOSIS — R300 Dysuria: Secondary | ICD-10-CM | POA: Diagnosis not present

## 2020-06-30 DIAGNOSIS — I10 Essential (primary) hypertension: Secondary | ICD-10-CM | POA: Diagnosis not present

## 2020-07-03 DIAGNOSIS — F319 Bipolar disorder, unspecified: Secondary | ICD-10-CM | POA: Diagnosis not present

## 2020-07-03 DIAGNOSIS — F422 Mixed obsessional thoughts and acts: Secondary | ICD-10-CM | POA: Diagnosis not present

## 2020-07-03 DIAGNOSIS — F71 Moderate intellectual disabilities: Secondary | ICD-10-CM | POA: Diagnosis not present

## 2020-07-03 DIAGNOSIS — F329 Major depressive disorder, single episode, unspecified: Secondary | ICD-10-CM | POA: Diagnosis not present

## 2020-07-07 DIAGNOSIS — D849 Immunodeficiency, unspecified: Secondary | ICD-10-CM | POA: Diagnosis not present

## 2020-07-07 DIAGNOSIS — Z79899 Other long term (current) drug therapy: Secondary | ICD-10-CM | POA: Diagnosis not present

## 2020-07-07 DIAGNOSIS — Z940 Kidney transplant status: Secondary | ICD-10-CM | POA: Diagnosis not present

## 2020-07-08 ENCOUNTER — Ambulatory Visit (INDEPENDENT_AMBULATORY_CARE_PROVIDER_SITE_OTHER): Payer: Medicare Other | Admitting: Cardiology

## 2020-07-08 ENCOUNTER — Encounter: Payer: Self-pay | Admitting: Cardiology

## 2020-07-08 ENCOUNTER — Other Ambulatory Visit: Payer: Self-pay

## 2020-07-08 VITALS — BP 138/76 | HR 81 | Ht 64.0 in | Wt 170.0 lb

## 2020-07-08 DIAGNOSIS — R079 Chest pain, unspecified: Secondary | ICD-10-CM

## 2020-07-08 DIAGNOSIS — E669 Obesity, unspecified: Secondary | ICD-10-CM

## 2020-07-08 NOTE — Progress Notes (Signed)
Electrophysiology Office Follow up Visit Note:    Date:  07/08/2020   ID:  Vanessa Alvarez, DOB [birth year], MRN [id number]  PCP:  Cleophas Dunker, DO  [place] Cardiologist:  No primary care provider on file.  [facility] Electrophysiologist:  None    Interval History:    Vanessa Alvarez is a 35 y.o. female who presents for a follow up visit.  I last saw the patient January 29, 2020 for atypical chest pain.  At that appointment, a stress test was ordered.  She has completed the exercise tolerance test which showed no evidence of coronary artery disease.  She had a low exertional capacity.  She is also had an echocardiogram recently at [facility] which showed no significant abnormalities.  She also had a recent CT of the chest abdomen pelvis which did not show any significant thoracic pathology.  She tells me she continues to have intermittent pain.  Pain episodes last minutes at a time and occur both with exertion and at rest.  They are in the center of her chest and epigastrium.     Past Medical History:  Diagnosis Date  . Anemia    Iron Def  . Anxiety   . Autism spectrum   . Behavioral problems   . Depression   . End stage renal disease ([place])   . GERD (gastroesophageal reflux disease)   . Hyperlipidemia   . Hypertension   . HYPOTHYROIDISM 03/12/2009   Qualifier: Diagnosis of  By: Earley Favor MD, Cat    . MENTAL RETARDATION 06/23/2006   Qualifier: Diagnosis of  By: Eusebio Friendly    . Mood disorder [facility])    Sees Dr Silvio Pate, who prescribes meds  . Shortness of breath dyspnea    with exertion    Past Surgical History:  Procedure Laterality Date  . AV FISTULA PLACEMENT Left 03/17/2015   Procedure: CREATION OF LEFT BRACHIOCEPHALIC ARTERIOVENOUS (AV) FISTULA ;  Surgeon: Conrad Nortonville, MD;  Location: [place];  Service: Vascular;  Laterality: Left;  . CHOLECYSTECTOMY  11/2004  . INSERTION OF DIALYSIS CATHETER Right 03/17/2015   Procedure: INSERTION OF DIALYSIS CATHETER RIGHT  INTERNAL JUIGULAR PLACEMENT;  Surgeon: Conrad Lower Burrell, MD;  Location: [place];  Service: Vascular;  Laterality: Right;    Current Medications: Current Meds  Medication Sig  . ARIPiprazole (ABILIFY) 5 MG tablet Take 10 mg by mouth at bedtime.  Marland Kitchen aspirin EC 81 MG tablet Take 81 mg by mouth daily. Swallow whole.  . busPIRone (BUSPAR) 5 MG tablet Take 10 mg by mouth 3 (three) times daily.  . ferrous sulfate 325 (65 FE) MG tablet Take 1 tablet (325 mg total) by mouth 2 (two) times daily.  . hydrOXYzine (ATARAX/VISTARIL) 25 MG tablet Take 25 mg by mouth 3 (three) times daily as needed.  . lamoTRIgine (LAMICTAL) 150 MG tablet Take 150 mg by mouth at bedtime.  Marland Kitchen levothyroxine (SYNTHROID) 112 MCG tablet TAKE 1 TABLET BY MOUTH EVERY DAY BEFORE BREAKFAST  . metoprolol succinate (TOPROL-XL) 25 MG 24 hr tablet Take 1 tablet by mouth daily.  . mycophenolate (MYFORTIC) 180 MG EC tablet Take 180 mg by mouth 2 (two) times daily.  Marland Kitchen omeprazole (PRILOSEC) 40 MG capsule TAKE 1 CAPSULE BY MOUTH EVERY DAY (Patient taking differently: in the morning and at bedtime. TAKE 1 CAPSULE BY MOUTH EVERY DAY)  . polyethylene glycol powder (GLYCOLAX/MIRALAX) 17 GM/SCOOP powder Take 17 g by mouth daily as needed for mild constipation. Reported on 05/26/2015  .  predniSONE (DELTASONE) 5 MG tablet Take 10 mg by mouth daily.  . sennosides-docusate sodium (SENOKOT-S) 8.6-50 MG tablet Take 1 tablet by mouth in the morning and at bedtime.  . sertraline (ZOLOFT) 100 MG tablet Take 100 mg by mouth daily.   . Tacrolimus ER (ENVARSUS XR) 1 MG TB24 Take by mouth. Take with 4 mg tablets for a total daily dose of 6 mg  . Tacrolimus ER (ENVARSUS XR) 4 MG TB24 Take by mouth. Take with 1 mg tablet for a total daily dose of 6 mg  . [DISCONTINUED] nystatin (MYCOSTATIN) 100000 UNIT/ML suspension Take 5 mLs (500,000 Units total) by mouth 4 (four) times daily.     Allergies:   Patient has no known allergies.   Social History   Socioeconomic History   . Marital status: Single    Spouse name: Not on file  . Number of children: 0  . Years of education: Not on file  . Highest education level: Not on file  Occupational History  . Occupation: disabled  Tobacco Use  . Smoking status: Never Smoker  . Smokeless tobacco: Never Used  Substance and Sexual Activity  . Alcohol use: No    Alcohol/week: 0.0 standard drinks  . Drug use: No  . Sexual activity: Never  Other Topics Concern  . Not on file  Social History Narrative   ** Merged History Encounter **       Lives with mother.   Very sedentary livestyle.   No etoh, tobacco, drugs, intercourse.      MR.    Social Determinants of Health   Financial Resource Strain: Not on file  Food Insecurity: Not on file  Transportation Needs: Not on file  Physical Activity: Not on file  Stress: Not on file  Social Connections: Not on file     Family History: The patient's family history includes Bladder Cancer in her maternal grandmother; Cancer in her maternal grandfather; Diabetes in her maternal grandfather and maternal grandmother; Heart disease in her maternal grandmother; Hypertension in her maternal grandmother and mother.  ROS:   Please see the history of present illness.    All other systems reviewed and are negative.  EKGs/Labs/Other Studies Reviewed:    The following studies were reviewed today:  June 02, 2020 [facility] echo report only The left ventricular size is normal.  Left ventricular systolic function is normal.  LV ejection fraction = 60-65%.  The right ventricle is normal in size and function.  The left atrium is mildly dilated.  There is mild to moderate mitral regurgitation.  No significant stenosis seen  There was insufficient TR detected to calculate RV systolic pressure.  Estimated right atrial pressure is 5 mmHg.Marland Kitchen  There is no pericardial effusion.   March 27, 2020 CT chest abdomen pelvis 1. Right iliac fossa renal transplant with mildly  edematous appearance and surrounding perinephric stranding raising concern for infection. Consider correlation with urinalysis. No overt hydronephrosis or large perinephric collections.  2. Redemonstrated enlarged leiomyomatous uterus.  3. Additional findings as above.     EKG:  The ekg ordered today demonstrates sinus rhythm  Recent Labs: No results found for requested labs within last 8760 hours.  Recent Lipid Panel    Component Value Date/Time   CHOL 176 05/28/2016 0833   TRIG 106 05/28/2016 0833   HDL 60 05/28/2016 0833   CHOLHDL 2.9 05/28/2016 0833   VLDL 21 05/28/2016 0833   LDLCALC 95 05/28/2016 0833    Physical Exam:  VS:  BP 138/76   Pulse 81   Ht '5\' 4"'$  (1.626 m)   Wt 170 lb (77.1 kg)   LMP 06/12/2020   SpO2 98%   BMI 29.18 kg/m     Wt Readings from Last 3 Encounters:  07/08/20 170 lb (77.1 kg)  06/13/20 168 lb (76.2 kg)  04/30/20 164 lb 9.6 oz (74.7 kg)     GEN:  Well nourished, well developed in no acute distress HEENT: Normal NECK: No JVD; No carotid bruits LYMPHATICS: No lymphadenopathy CARDIAC: RRR, no murmurs, rubs, gallops RESPIRATORY:  Clear to auscultation without rales, wheezing or rhonchi  ABDOMEN: Soft, non-tender, non-distended MUSCULOSKELETAL:  No edema; No deformity  SKIN: Warm and dry NEUROLOGIC:  Alert and oriented x 3 PSYCHIATRIC:  Normal affect   ASSESSMENT:    1. Chest pain of uncertain etiology   2. Obesity, Class II, BMI 35-39.9    PLAN:    In order of problems listed above:  1. Atypical chest pain Unclear etiology for her symptoms.  Has been going on for months now.  Has been evaluated with a CT chest and pelvis, echo, stress test.  No signs of cardiac pathology.  Possible these are musculoskeletal or GI related pains.  She is on longstanding steroid.  Question ulcer pain.  I recommended that she follow-up with her primary care physician.  Follow-up as needed.   Medication Adjustments/Labs and Tests  Ordered: Current medicines are reviewed at length with the patient today.  Concerns regarding medicines are outlined above.  No orders of the defined types were placed in this encounter.  No orders of the defined types were placed in this encounter.    Signed, Lars Mage, MD, [facility]  07/08/2020 9:22 AM    Electrophysiology [place]

## 2020-07-08 NOTE — Patient Instructions (Signed)
Medication Instructions:  Your physician recommends that you continue on your current medications as directed. Please refer to the Current Medication list given to you today.  *If you need a refill on your cardiac medications before your next appointment, please call your pharmacy*   Lab Work: None ordered   Testing/Procedures: None ordered   Follow-Up: At [facility], you and your health needs are our priority.  As part of our continuing mission to provide you with exceptional heart care, we have created designated Provider Care Teams.  These Care Teams include your primary Cardiologist (physician) and Advanced Practice Providers (APPs -  Physician Assistants and Nurse Practitioners) who all work together to provide you with the care you need, when you need it.   Your next appointment:    as needed  The format for your next appointment:   In Person  Provider:   Lars Mage, MD    Thank you for choosing [facility]!!   [phone]

## 2020-07-14 DIAGNOSIS — Z940 Kidney transplant status: Secondary | ICD-10-CM | POA: Diagnosis not present

## 2020-07-14 DIAGNOSIS — Z4822 Encounter for aftercare following kidney transplant: Secondary | ICD-10-CM | POA: Diagnosis not present

## 2020-07-18 DIAGNOSIS — F422 Mixed obsessional thoughts and acts: Secondary | ICD-10-CM | POA: Diagnosis not present

## 2020-07-18 DIAGNOSIS — F329 Major depressive disorder, single episode, unspecified: Secondary | ICD-10-CM | POA: Diagnosis not present

## 2020-07-18 DIAGNOSIS — F71 Moderate intellectual disabilities: Secondary | ICD-10-CM | POA: Diagnosis not present

## 2020-07-18 DIAGNOSIS — F319 Bipolar disorder, unspecified: Secondary | ICD-10-CM | POA: Diagnosis not present

## 2020-07-28 DIAGNOSIS — Z7952 Long term (current) use of systemic steroids: Secondary | ICD-10-CM | POA: Diagnosis not present

## 2020-07-28 DIAGNOSIS — K219 Gastro-esophageal reflux disease without esophagitis: Secondary | ICD-10-CM | POA: Diagnosis not present

## 2020-07-28 DIAGNOSIS — I10 Essential (primary) hypertension: Secondary | ICD-10-CM | POA: Diagnosis not present

## 2020-07-28 DIAGNOSIS — F419 Anxiety disorder, unspecified: Secondary | ICD-10-CM | POA: Diagnosis not present

## 2020-07-28 DIAGNOSIS — R109 Unspecified abdominal pain: Secondary | ICD-10-CM | POA: Diagnosis not present

## 2020-07-28 DIAGNOSIS — N281 Cyst of kidney, acquired: Secondary | ICD-10-CM | POA: Diagnosis not present

## 2020-07-28 DIAGNOSIS — N2581 Secondary hyperparathyroidism of renal origin: Secondary | ICD-10-CM | POA: Diagnosis not present

## 2020-07-28 DIAGNOSIS — F39 Unspecified mood [affective] disorder: Secondary | ICD-10-CM | POA: Diagnosis not present

## 2020-07-28 DIAGNOSIS — D849 Immunodeficiency, unspecified: Secondary | ICD-10-CM | POA: Diagnosis not present

## 2020-07-28 DIAGNOSIS — Z79899 Other long term (current) drug therapy: Secondary | ICD-10-CM | POA: Diagnosis not present

## 2020-07-28 DIAGNOSIS — D72819 Decreased white blood cell count, unspecified: Secondary | ICD-10-CM | POA: Diagnosis not present

## 2020-07-28 DIAGNOSIS — Z862 Personal history of diseases of the blood and blood-forming organs and certain disorders involving the immune mechanism: Secondary | ICD-10-CM | POA: Diagnosis not present

## 2020-07-28 DIAGNOSIS — F79 Unspecified intellectual disabilities: Secondary | ICD-10-CM | POA: Diagnosis not present

## 2020-07-28 DIAGNOSIS — Z9889 Other specified postprocedural states: Secondary | ICD-10-CM | POA: Diagnosis not present

## 2020-07-28 DIAGNOSIS — E039 Hypothyroidism, unspecified: Secondary | ICD-10-CM | POA: Diagnosis not present

## 2020-07-28 DIAGNOSIS — R079 Chest pain, unspecified: Secondary | ICD-10-CM | POA: Diagnosis not present

## 2020-07-28 DIAGNOSIS — Z940 Kidney transplant status: Secondary | ICD-10-CM | POA: Diagnosis not present

## 2020-07-28 DIAGNOSIS — Z87898 Personal history of other specified conditions: Secondary | ICD-10-CM | POA: Diagnosis not present

## 2020-07-28 DIAGNOSIS — Z4822 Encounter for aftercare following kidney transplant: Secondary | ICD-10-CM | POA: Diagnosis not present

## 2020-07-28 DIAGNOSIS — F840 Autistic disorder: Secondary | ICD-10-CM | POA: Diagnosis not present

## 2020-07-28 DIAGNOSIS — I150 Renovascular hypertension: Secondary | ICD-10-CM | POA: Diagnosis not present

## 2020-07-28 DIAGNOSIS — D708 Other neutropenia: Secondary | ICD-10-CM | POA: Diagnosis not present

## 2020-07-28 DIAGNOSIS — Z792 Long term (current) use of antibiotics: Secondary | ICD-10-CM | POA: Diagnosis not present

## 2020-07-29 ENCOUNTER — Ambulatory Visit: Payer: Medicare Other | Admitting: Cardiology

## 2020-07-30 NOTE — Progress Notes (Signed)
     SUBJECTIVE:   CHIEF COMPLAINT / HPI:   Vanessa Alvarez is a 35 y.o. female presents for GI refereral   Atypical chest pain  Patient has been having left-sided chest pain for the last 5 to 6 months.  She feels like something is sitting on her chest when she gets the pain.not related to exertion and can occur randomly. Radiation to the left shoulder and epigastrium.  Also has associated reflux, palpitations, dizziness and dyspnea. Takes Omeprazole, tums and Gas x helps sometimes.  Denies diaphoresis, nausea,vomiting, diarrhea, abdominal pain, rectal bleeding or melena.  Patient has been referred to cardiology and worked up the atypical chest pain.  Exercise tolerance test negative, echo no significant abnormalities, CT chest abdomen pelvis without thoracic pathology.  Cardiology recommended referral to gastroenterology for further work-up of epigastric pain.    [place] Office Visit from 07/31/2020 in [place]  PHQ-9 Total Score 0       Health Maintenance Due  Topic  . Hepatitis C Screening   . TETANUS/TDAP   . PAP SMEAR-Modifier       PERTINENT  PMH / PSH: Hypertension, hypothyroidism, ESRD  OBJECTIVE:   BP 108/80   Pulse 88   Ht '5\' 4"'$  (1.626 m)   Wt 176 lb 9.6 oz (80.1 kg)   LMP 07/29/2020 (Exact Date)   SpO2 99%   BMI 30.31 kg/m    General: Alert, no acute distress Cardio: Normal S1 and S2, RRR, no r/m/g Pulm: CTAB, normal work of breathing Abdomen: Bowel sounds normal. Abdomen soft and non-tender.  Extremities: No peripheral edema.  Neuro: Cranial nerves grossly intact   ASSESSMENT/PLAN:   Left-sided chest pain Patient asymptomatic in the office today. Pt has had extensive work-up with cardiology which has been negative (exercise tolerance test, echo, CT chest abdomen pelvis).  Cardiology feels it could be related to peptic ulcer disease and recommended referral to GI.  Patient is also on prednisone which increases the risk of this.   Referred to GI today.  Recommended patient continues omeprazole, Tums and Gas-X as needed.      Lattie Haw, MD PGY-2 [place]

## 2020-07-31 ENCOUNTER — Encounter: Payer: Self-pay | Admitting: Family Medicine

## 2020-07-31 ENCOUNTER — Ambulatory Visit (INDEPENDENT_AMBULATORY_CARE_PROVIDER_SITE_OTHER): Payer: Medicare Other | Admitting: Family Medicine

## 2020-07-31 ENCOUNTER — Other Ambulatory Visit: Payer: Self-pay

## 2020-07-31 VITALS — BP 108/80 | HR 88 | Ht 64.0 in | Wt 176.6 lb

## 2020-07-31 DIAGNOSIS — R079 Chest pain, unspecified: Secondary | ICD-10-CM | POA: Diagnosis not present

## 2020-07-31 DIAGNOSIS — R1013 Epigastric pain: Secondary | ICD-10-CM | POA: Diagnosis not present

## 2020-07-31 DIAGNOSIS — Z131 Encounter for screening for diabetes mellitus: Secondary | ICD-10-CM | POA: Diagnosis not present

## 2020-07-31 DIAGNOSIS — R112 Nausea with vomiting, unspecified: Secondary | ICD-10-CM

## 2020-07-31 NOTE — Assessment & Plan Note (Signed)
>>  ASSESSMENT AND PLAN FOR LEFT-SIDED CHEST PAIN WRITTEN ON 07/31/2020  9:48 AM BY PATEL, POONAM, MD  Patient asymptomatic in the office today. Pt has had extensive work-up with cardiology which has been negative (exercise tolerance test, echo, CT chest abdomen pelvis).  Cardiology feels it could be related to peptic ulcer disease and recommended referral to GI.  Patient is also on prednisone which increases the risk of this.  Referred to GI today.  Recommended patient continues omeprazole, Tums and Gas-X as needed.

## 2020-07-31 NOTE — Assessment & Plan Note (Addendum)
Patient asymptomatic in the office today. Pt has had extensive work-up with cardiology which has been negative (exercise tolerance test, echo, CT chest abdomen pelvis).  Cardiology feels it could be related to peptic ulcer disease and recommended referral to GI.  Patient is also on prednisone which increases the risk of this.  Referred to GI today.  Recommended patient continues omeprazole, Tums and Gas-X as needed.

## 2020-07-31 NOTE — Patient Instructions (Signed)
Thank you for coming to see me today. It was a pleasure. Today we discussed referral to GI for the chest pains. I have placed a referral for this. They will contact you soon.  Please follow-up with your PCP as and when needed.    If you have any questions or concerns, please do not hesitate to call the office at [phone].  Best wishes,   Dr Posey Pronto

## 2020-08-01 DIAGNOSIS — F422 Mixed obsessional thoughts and acts: Secondary | ICD-10-CM | POA: Diagnosis not present

## 2020-08-01 DIAGNOSIS — F71 Moderate intellectual disabilities: Secondary | ICD-10-CM | POA: Diagnosis not present

## 2020-08-01 DIAGNOSIS — F319 Bipolar disorder, unspecified: Secondary | ICD-10-CM | POA: Diagnosis not present

## 2020-08-11 DIAGNOSIS — D849 Immunodeficiency, unspecified: Secondary | ICD-10-CM | POA: Diagnosis not present

## 2020-08-11 DIAGNOSIS — Z940 Kidney transplant status: Secondary | ICD-10-CM | POA: Diagnosis not present

## 2020-08-15 ENCOUNTER — Ambulatory Visit (INDEPENDENT_AMBULATORY_CARE_PROVIDER_SITE_OTHER): Payer: Medicare Other | Admitting: Physician Assistant

## 2020-08-15 ENCOUNTER — Encounter: Payer: Self-pay | Admitting: Physician Assistant

## 2020-08-15 ENCOUNTER — Other Ambulatory Visit: Payer: Self-pay

## 2020-08-15 VITALS — BP 120/82 | HR 87 | Ht 64.0 in | Wt 177.8 lb

## 2020-08-15 DIAGNOSIS — K219 Gastro-esophageal reflux disease without esophagitis: Secondary | ICD-10-CM

## 2020-08-15 DIAGNOSIS — R1310 Dysphagia, unspecified: Secondary | ICD-10-CM

## 2020-08-15 DIAGNOSIS — R079 Chest pain, unspecified: Secondary | ICD-10-CM

## 2020-08-15 DIAGNOSIS — R1013 Epigastric pain: Secondary | ICD-10-CM

## 2020-08-15 MED ORDER — SUCRALFATE 1 GM/10ML PO SUSP: 1.0000 g | Freq: Three times a day (TID) | ORAL | 1 refills | Status: DC

## 2020-08-15 NOTE — Progress Notes (Signed)
Agree with assessment and plan as outlined.  

## 2020-08-15 NOTE — Progress Notes (Signed)
Subjective:    Patient ID: Vanessa Alvarez, female    DOB: 1985-10-06, 35 y.o.   MRN: [id number]  HPI Vanessa Alvarez is a pleasant 35 year old African-American female, established with Dr. Havery Moros who was last seen here in 2017. She comes in today with complaints of chest pain and epigastric pain. 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  She is now being maintained on Envarsus, and has also been on prednisone.  She has been doing well post transplant according to patient's mom.  She also has history of hypertension, chronic GERD, constipation, anxiety, and intellectual disability. When she was seen here in 2017 she had undergone EGD which showed a few diminutive sessile polyps in the stomach which by biopsy were fundic gland polyps.  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  No H. Pylori.  She has been complaining of intermittent chest pain and then episodes of epigastric pain that radiate to her back over the past few months.  She has been seen and evaluated by cardiology as of March 2022/DrQuentin Ore.  She has had CT of the chest, stress testing EKG and echo all unrevealing for any cardiac source for her chest pain.  It was suggested that she be seen by GI. Most recent labs 08/11/2020 WBC 5.1, hemoglobin 12.3, MCV of 78, creatinine 1.11 and LFTs within normal limits.  She is on chronic PPI therapy with omeprazole 20 mg p.o. twice daily. She says that her chest pain is sharp in nature and present on a daily basis but not all day long.  This seems to be intermittent sometimes may be associated with eating but not always.  She denies any belching or burping or indigestion.  No heartburn complaints.  She has some complaint of intermittent odynophagia and occasional dysphagia but actually points more towards her neck and upper chest. The epigastric  pain seems to occur intermittently and may last for a few minutes can be associated with some nausea but no vomiting.  She has not had any changes in bowel habits. She is status post cholecystectomy.  She has not had any recent abdominal imaging..  She had been on higher doses of prednisone over the past few months and is gradually tapering down, currently on 5 mg daily.  Review of Systems Pertinent positive and negative review of systems were noted in the above HPI section.  All other review of systems was otherwise negative.  Outpatient Encounter Medications as of 08/15/2020  Medication Sig  . AMLODIPINE BESYLATE PO Take 5 mg by mouth daily.  Marland Kitchen aripiprazole (ABILIFY) 10 MG disintegrating tablet Take 10 mg by mouth at bedtime.  Marland Kitchen aspirin EC 81 MG tablet Take 81 mg by mouth daily. Swallow whole.  . busPIRone (BUSPAR) 10 MG tablet Take 10 mg by mouth 3 (three) times daily.  . ferrous sulfate 325 (65 FE) MG tablet Take 1 tablet (325 mg total) by mouth 2 (two) times daily.  . hydrOXYzine (ATARAX/VISTARIL) 25 MG tablet Take 25 mg by mouth 3 (three) times daily as needed.  . lamoTRIgine (LAMICTAL) 200 MG tablet Take 100 mg by mouth every morning.  Marland Kitchen levothyroxine (SYNTHROID) 112 MCG tablet TAKE 1 TABLET BY MOUTH EVERY DAY BEFORE BREAKFAST  . metoprolol succinate (TOPROL-XL) 25 MG 24 hr tablet Take 1 tablet by mouth daily.  . mycophenolate (MYFORTIC) 180 MG EC  tablet Take 180 mg by mouth See admin instructions. Take 3 tablets in the morning and 3 tablets in the evening  . omeprazole (PRILOSEC) 40 MG capsule Take 40 mg by mouth 2 (two) times daily before a meal.  . polyethylene glycol powder (GLYCOLAX/MIRALAX) 17 GM/SCOOP powder Take 17 g by mouth daily as needed for mild constipation. Reported on 05/26/2015  . predniSONE (DELTASONE) 5 MG tablet Take 5 mg by mouth daily.  . sennosides-docusate sodium (SENOKOT-S) 8.6-50 MG tablet Take 1 tablet by mouth in the morning and at bedtime.  . sertraline  (ZOLOFT) 100 MG tablet Take 100 mg by mouth daily.   . sucralfate (CARAFATE) 1 GM/10ML suspension Take 10 mLs (1 g total) by mouth 4 (four) times daily -  with meals and at bedtime.  . Tacrolimus (ENVARSUS XR PO) Take 9 mg by mouth daily.  . [DISCONTINUED] ARIPiprazole (ABILIFY) 5 MG tablet Take 10 mg by mouth at bedtime.  . [DISCONTINUED] busPIRone (BUSPAR) 5 MG tablet Take 10 mg by mouth 3 (three) times daily.  . [DISCONTINUED] lamoTRIgine (LAMICTAL) 150 MG tablet Take 150 mg by mouth at bedtime.  . [DISCONTINUED] mycophenolate (MYFORTIC) 180 MG EC tablet Take 180 mg by mouth 2 (two) times daily.  . [DISCONTINUED] omeprazole (PRILOSEC) 40 MG capsule TAKE 1 CAPSULE BY MOUTH EVERY DAY (Patient taking differently: in the morning and at bedtime. TAKE 1 CAPSULE BY MOUTH EVERY DAY)  . [DISCONTINUED] predniSONE (DELTASONE) 5 MG tablet Take 10 mg by mouth daily.  . [DISCONTINUED] Tacrolimus ER (ENVARSUS XR) 1 MG TB24 Take by mouth. Take with 4 mg tablets for a total daily dose of 6 mg  . [DISCONTINUED] Tacrolimus ER (ENVARSUS XR) 4 MG TB24 Take by mouth. Take with 1 mg tablet for a total daily dose of 6 mg   No facility-administered encounter medications on file as of 08/15/2020.   No Known Allergies Patient Active Problem List   Diagnosis Date Noted  . Sore throat 04/30/2020  . Right hip pain 02/04/2020  . Left-sided chest pain 08/23/2019  . Abnormal glucose 06/15/2019  . Chronic idiopathic constipation 06/15/2019  . Neck pain 05/04/2017  . GERD (gastroesophageal reflux disease) 05/04/2017  . Paranoia ([place]) 10/16/2015  . Hyperphosphatemia 07/11/2015  . Affective disorder ([place]) 07/06/2015  . Rectal bleeding 03/04/2015  . 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 Nausea & vomiting 02/12/2015  . ESRD on dialysis ([place])   . Nystagmus 08/22/2013  . 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 Headache 07/13/2011  . ECZEMA, ATOPIC 11/05/2009  . Hypothyroidism 03/12/2009  . Obesity, Class II, BMI  35-39.9 02/26/2009  . Essential hypertension 02/26/2009  . Iron deficiency anemia 05/20/2008  . Anxiety state 06/23/2006  . MENTAL RETARDATION 06/23/2006   Social History   Socioeconomic History  . Marital status: Single    Spouse name: Not on file  . Number of children: 0  . Years of education: Not on file  . Highest education level: Not on file  Occupational History  . Occupation: disabled  Tobacco Use  . Smoking status: Never Smoker  . Smokeless tobacco: Never Used  Vaping Use  . Vaping Use: Never used  Substance and Sexual Activity  . Alcohol use: No    Alcohol/week: 0.0 standard drinks  . Drug use: No  . Sexual activity: Never  Other Topics Concern  . Not on file  Social History Narrative   ** Merged History Encounter **       Lives with mother.   Very  sedentary livestyle.   No etoh, tobacco, drugs, intercourse.      MR.    Social Determinants of Health   Financial Resource Strain: Not on file  Food Insecurity: Not on file  Transportation Needs: Not on file  Physical Activity: Not on file  Stress: Not on file  Social Connections: Not on file  Intimate Partner Violence: Not on file    Vanessa Alvarez's family history includes Bladder Cancer in her maternal grandmother; Cancer in her maternal grandfather; Diabetes in her maternal grandfather and maternal grandmother; Heart disease in her maternal grandmother; Hypertension in her maternal grandmother and mother.      Objective:    Vitals:   08/15/20 1520  BP: 120/82  Pulse: 87  SpO2: 99%    Physical Exam Well-developed well-nourished African-American female in no acute distress.  Height, Weight, 177 BMI 30.5 accompanied by her mother  HEENT; nontraumatic normocephalic, EOMI, PE R LA, sclera anicteric. Oropharynx; no obvious oral thrush Neck; supple, no JVD Cardiovascular; regular rate and rhythm with S1-S2, no murmur rub or gallop Pulmonary; Clear bilaterally Abdomen; soft, nontender, nondistended, no  palpable mass or hepatosplenomegaly, bowel sounds are active, laparoscopic cholecystectomy incisional scars, right lower quadrant incisional scar no definite palpable implanted kidney Rectal; not done today Skin; benign exam, no jaundice rash or appreciable lesions Extremities; no clubbing cyanosis or edema skin warm and dry Neuro/Psych; alert and oriented x4, grossly nonfocal mood and affect appropriate       Assessment & Plan:   #56 35 year old African-American female with history of 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 Patient presents with intermittent chest pain, and epigastric pain which has been sporadic with radiation to the back over the past couple of months.  She has undergone cardiac evaluation which was unremarkable She does have history of chronic GERD and is maintained on omeprazole 20 mg p.o. twice daily She is status post cholecystectomy Recent labs unrevealing  Etiology of her symptoms is not entirely clear, there is some vague complaint of dysphagia and occasional odynophagia and some sensation of chest pain at times being associated with eating though he epigastric pain does not seem to be. Need to rule out acute esophagitis, consider candidiasis with immunosuppression and oral steroids, rule out gastropathy, peptic ulcer disease.  #2 history of hypertension 3.  Anxiety 4.  Intellectual disability  Plan; continue omeprazole 20 mg p.o. twice daily Add course of Carafate suspension 1 g between meals and at bedtime x1 month Patient will be scheduled for EGD with possible esophageal dilation with Dr. Havery Moros.  Procedure was discussed in detail with the patient and her mom including indications risk and benefits and they are agreeable to proceed. If EGD is unrevealing then will need cross-sectional imaging of the abdomen.  Vanessa Alvarez Genia Harold PA-C 08/15/2020   Cc:  Cleophas Dunker, *

## 2020-08-15 NOTE — Patient Instructions (Addendum)
If you are age 35 or older, your body mass index should be between 23-30. Your Body mass index is 30.52 kg/m. If this is out of the aforementioned range listed, please consider follow up with your Primary Care Provider.  If you are age 27 or younger, your body mass index should be between 19-25. Your Body mass index is 30.52 kg/m. If this is out of the aformentioned range listed, please consider follow up with your Primary Care Provider.   You have been scheduled for an endoscopy. Please follow written instructions given to you at your visit today. If you use inhalers (even only as needed), please bring them with you on the day of your procedure.  Continue Omeprazole 20 mg 1 capsule twice daily  START Sucralfate Suspension 1 gram three times daily before meals and at bedtime.  Follow up pending the results of your Endoscopy or as needed.  Thank you for entrusting me with your care and choosing [facility].  Amy Esterwood, PA-C

## 2020-08-19 ENCOUNTER — Ambulatory Visit (AMBULATORY_SURGERY_CENTER): Payer: Medicare Other | Admitting: Gastroenterology

## 2020-08-19 ENCOUNTER — Other Ambulatory Visit: Payer: Self-pay

## 2020-08-19 ENCOUNTER — Encounter: Payer: Self-pay | Admitting: Gastroenterology

## 2020-08-19 ENCOUNTER — Other Ambulatory Visit: Payer: Self-pay | Admitting: Gastroenterology

## 2020-08-19 VITALS — BP 137/63 | HR 91 | Temp 97.5°F | Resp 21 | Ht 64.0 in | Wt 177.0 lb

## 2020-08-19 DIAGNOSIS — K3189 Other diseases of stomach and duodenum: Secondary | ICD-10-CM | POA: Diagnosis not present

## 2020-08-19 DIAGNOSIS — R1013 Epigastric pain: Secondary | ICD-10-CM

## 2020-08-19 DIAGNOSIS — K319 Disease of stomach and duodenum, unspecified: Secondary | ICD-10-CM

## 2020-08-19 DIAGNOSIS — R109 Unspecified abdominal pain: Secondary | ICD-10-CM | POA: Diagnosis not present

## 2020-08-19 DIAGNOSIS — R1310 Dysphagia, unspecified: Secondary | ICD-10-CM

## 2020-08-19 DIAGNOSIS — K219 Gastro-esophageal reflux disease without esophagitis: Secondary | ICD-10-CM

## 2020-08-19 MED ORDER — SODIUM CHLORIDE 0.9 % IV SOLN: 500.0000 mL | Freq: Once | INTRAVENOUS | Status: DC

## 2020-08-19 NOTE — Progress Notes (Signed)
VS-KW 

## 2020-08-19 NOTE — Progress Notes (Signed)
PT taken to PACU. Monitors in place. VSS. Report given to RN. 

## 2020-08-19 NOTE — Patient Instructions (Signed)
Handout given:  Hiatal hernia Resume previous diet Continue current medications Await pathology results  YOU HAD AN ENDOSCOPIC PROCEDURE TODAY AT [facility]:   Refer to the procedure report that was given to you for any specific questions about what was found during the examination.  If the procedure report does not answer your questions, please call your gastroenterologist to clarify.  If you requested that your care partner not be given the details of your procedure findings, then the procedure report has been included in a sealed envelope for you to review at your convenience later.  YOU SHOULD EXPECT: Some feelings of bloating in the abdomen. Passage of more gas than usual.  Walking can help get rid of the air that was put into your GI tract during the procedure and reduce the bloating. If you had a lower endoscopy (such as a colonoscopy or flexible sigmoidoscopy) you may notice spotting of blood in your stool or on the toilet paper. If you underwent a bowel prep for your procedure, you may not have a normal bowel movement for a few days.  Please Note:  You might notice some irritation and congestion in your nose or some drainage.  This is from the oxygen used during your procedure.  There is no need for concern and it should clear up in a day or so.  SYMPTOMS TO REPORT IMMEDIATELY:  Following upper endoscopy (EGD)  Vomiting of blood or coffee ground material  New chest pain or pain under the shoulder blades  Painful or persistently difficult swallowing  New shortness of breath  Fever of 100F or higher  Black, tarry-looking stools  For urgent or emergent issues, a gastroenterologist can be reached at any hour by calling [phone]. Do not use MyChart messaging for urgent concerns.    DIET:  We do recommend a small meal at first, but then you may proceed to your regular diet.  Drink plenty of fluids but you should avoid alcoholic beverages for 24 hours.  ACTIVITY:   You should plan to take it easy for the rest of today and you should NOT DRIVE or use heavy machinery until tomorrow (because of the sedation medicines used during the test).    FOLLOW UP: Our staff will call the number listed on your records 48-72 hours following your procedure to check on you and address any questions or concerns that you may have regarding the information given to you following your procedure. If we do not reach you, we will leave a message.  We will attempt to reach you two times.  During this call, we will ask if you have developed any symptoms of COVID 19. If you develop any symptoms (ie: fever, flu-like symptoms, shortness of breath, cough etc.) before then, please call [phone].  If you test positive for Covid 19 in the 2 weeks post procedure, please call and report this information to [place].    If any biopsies were taken you will be contacted by phone or by letter within the next 1-3 weeks.  Please call us at [phone] if you have not heard about the biopsies in 3 weeks.   SIGNATURES/CONFIDENTIALITY: You and/or your care partner have signed paperwork which will be entered into your electronic medical record.  These signatures attest to the fact that that the information above on your After Visit Summary has been reviewed and is understood.  Full responsibility of the confidentiality of this discharge information lies with you and/or your care-partner.

## 2020-08-19 NOTE — Op Note (Signed)
[facility] Patient Name: Vanessa Alvarez Procedure Date: 08/19/2020 2:50 PM MRN: [id number] Endoscopist: Remo Lipps P. Havery Moros , MD Age: 35 Referring MD:  Date of Birth: 20-Jan-1986 Gender: Female Account #: [id number] Procedure:                Upper GI endoscopy Indications:              suspected gastro-esophageal reflux disease, Chest                            pain (non cardiac), also with intermittent                            dysphagia, epigastric pain - on omeprazole /                            carafate, patient states symptoms seem to persist Medicines:                Monitored Anesthesia Care Procedure:                Pre-Anesthesia Assessment:                           - Prior to the procedure, a History and Physical                            was performed, and patient medications and                            allergies were reviewed. The patient's tolerance of                            previous anesthesia was also reviewed. The risks                            and benefits of the procedure and the sedation                            options and risks were discussed with the patient.                            All questions were answered, and informed consent                            was obtained. Prior Anticoagulants: The patient has                            taken no previous anticoagulant or antiplatelet                            agents. ASA Grade Assessment: III - A patient with                            severe systemic disease. After reviewing the risks  and benefits, the patient was deemed in                            satisfactory condition to undergo the procedure.                           After obtaining informed consent, the endoscope was                            passed under direct vision. Throughout the                            procedure, the patient's blood pressure, pulse, and                            oxygen  saturations were monitored continuously. The                            Endoscope was introduced through the mouth, and                            advanced to the second part of duodenum. The upper                            GI endoscopy was accomplished without difficulty.                            The patient tolerated the procedure well. Scope In: Scope Out: Findings:                 Esophagogastric landmarks were identified: the                            Z-line was found at 35 cm, the gastroesophageal                            junction was found at 35 cm and the upper extent of                            the gastric folds was found at 36 cm from the                            incisors.                           A 1 cm hiatal hernia was present.                           The exam of the esophagus was otherwise normal. No                            inflammatory changes. No obvious stenosis /  stricture.                           Biopsies were taken with a cold forceps in the                            upper third of the esophagus, in the middle third                            of the esophagus and in the lower third of the                            esophagus for histology.                           The entire examined stomach was normal. Biopsies                            were taken with a cold forceps for Helicobacter                            pylori testing.                           Mucosal changes were found in the duodenal bulb                            including enlarged / hypertrophied fold vs. ectopic                            gastric mucosa. Biopsies were taken with a cold                            forceps for histology.                           The exam of the duodenum and ampulla was otherwise                            normal. Complications:            No immediate complications. Estimated blood loss:                             Minimal. Estimated Blood Loss:     Estimated blood loss was minimal. Impression:               - Esophagogastric landmarks identified.                           - 1 cm hiatal hernia.                           - Normal esophagus otherwise - biopsies taken to  rule out EoE.                           - Normal stomach. Biopsied to rule out H pylori.                           - Mucosal changes in the duodenum as outlined                            above, suspect benign changes / normal varient.                            Biopsied.                           - Normal duodenum otherwise                           No overt abnormality noted on this exam to account                            for the patient's symptoms. Will await pathology                            results. If symptoms persist consideration for 24                            hour pH impedance testing / manometry Recommendation:           - Patient has a contact number available for                            emergencies. The signs and symptoms of potential                            delayed complications were discussed with the                            patient. Return to normal activities tomorrow.                            Written discharge instructions were provided to the                            patient.                           - Resume previous diet.                           - Continue present medications.                           - Await pathology results with further  recommendations as outlined above. Remo Lipps P. Havery Moros, MD 08/19/2020 3:22:40 PM This report has been signed electronically.

## 2020-08-19 NOTE — Progress Notes (Signed)
Called to room to assist during endoscopic procedure.  Patient ID and intended procedure confirmed with present staff. Received instructions for my participation in the procedure from the performing physician.  

## 2020-08-21 ENCOUNTER — Telehealth: Payer: Self-pay

## 2020-08-21 NOTE — Telephone Encounter (Signed)
  Follow up Call-  Call back number 08/19/2020  Post procedure Call Back phone  # [phone]  Permission to leave phone message Yes  Some recent data might be hidden     Patient questions:  Do you have a fever, pain , or abdominal swelling? No. Pain Score  0 *  Have you tolerated food without any problems? Yes.    Have you been able to return to your normal activities? Yes.    Do you have any questions about your discharge instructions: Diet   No. Medications  No. Follow up visit  No.  Do you have questions or concerns about your Care? No.  Actions: * If pain score is 4 or above: No action needed, pain <4.  Spoke with mother and patient.  C/O sore throat.  Advised that it is normal after an EGD.  Instructed to gargle with warm salt water or use Cepachol.  Also advised to eat soft food.  Both agreed.  1. Have you developed a fever since your procedure? no  2.   Have you had an respiratory symptoms (SOB or cough) since your procedure? no  3.   Have you tested positive for COVID 19 since your procedure no  4.   Have you had any family members/close contacts diagnosed with the COVID 19 since your procedure?  no   If yes to any of these questions please route to Joylene John, RN and Joella Prince, RN

## 2020-08-25 DIAGNOSIS — Z792 Long term (current) use of antibiotics: Secondary | ICD-10-CM | POA: Diagnosis not present

## 2020-08-25 DIAGNOSIS — Z940 Kidney transplant status: Secondary | ICD-10-CM | POA: Diagnosis not present

## 2020-08-25 DIAGNOSIS — N281 Cyst of kidney, acquired: Secondary | ICD-10-CM | POA: Diagnosis not present

## 2020-08-25 DIAGNOSIS — K219 Gastro-esophageal reflux disease without esophagitis: Secondary | ICD-10-CM | POA: Diagnosis not present

## 2020-08-25 DIAGNOSIS — Z7952 Long term (current) use of systemic steroids: Secondary | ICD-10-CM | POA: Diagnosis not present

## 2020-08-25 DIAGNOSIS — Z4822 Encounter for aftercare following kidney transplant: Secondary | ICD-10-CM | POA: Diagnosis not present

## 2020-08-25 DIAGNOSIS — I10 Essential (primary) hypertension: Secondary | ICD-10-CM | POA: Diagnosis not present

## 2020-08-25 DIAGNOSIS — N2581 Secondary hyperparathyroidism of renal origin: Secondary | ICD-10-CM | POA: Diagnosis not present

## 2020-08-25 DIAGNOSIS — D849 Immunodeficiency, unspecified: Secondary | ICD-10-CM | POA: Diagnosis not present

## 2020-08-25 DIAGNOSIS — D72819 Decreased white blood cell count, unspecified: Secondary | ICD-10-CM | POA: Diagnosis not present

## 2020-08-25 DIAGNOSIS — Z79899 Other long term (current) drug therapy: Secondary | ICD-10-CM | POA: Diagnosis not present

## 2020-08-27 ENCOUNTER — Encounter: Payer: Self-pay | Admitting: *Deleted

## 2020-08-29 DIAGNOSIS — F319 Bipolar disorder, unspecified: Secondary | ICD-10-CM | POA: Diagnosis not present

## 2020-08-29 DIAGNOSIS — F71 Moderate intellectual disabilities: Secondary | ICD-10-CM | POA: Diagnosis not present

## 2020-08-29 DIAGNOSIS — F422 Mixed obsessional thoughts and acts: Secondary | ICD-10-CM | POA: Diagnosis not present

## 2020-08-29 DIAGNOSIS — F329 Major depressive disorder, single episode, unspecified: Secondary | ICD-10-CM | POA: Diagnosis not present

## 2020-09-08 DIAGNOSIS — F319 Bipolar disorder, unspecified: Secondary | ICD-10-CM | POA: Diagnosis not present

## 2020-09-08 DIAGNOSIS — F422 Mixed obsessional thoughts and acts: Secondary | ICD-10-CM | POA: Diagnosis not present

## 2020-09-08 DIAGNOSIS — F71 Moderate intellectual disabilities: Secondary | ICD-10-CM | POA: Diagnosis not present

## 2020-09-08 DIAGNOSIS — F329 Major depressive disorder, single episode, unspecified: Secondary | ICD-10-CM | POA: Diagnosis not present

## 2020-09-29 DIAGNOSIS — I10 Essential (primary) hypertension: Secondary | ICD-10-CM | POA: Diagnosis not present

## 2020-09-29 DIAGNOSIS — Z23 Encounter for immunization: Secondary | ICD-10-CM | POA: Diagnosis not present

## 2020-09-29 DIAGNOSIS — Z4822 Encounter for aftercare following kidney transplant: Secondary | ICD-10-CM | POA: Diagnosis not present

## 2020-09-29 DIAGNOSIS — D849 Immunodeficiency, unspecified: Secondary | ICD-10-CM | POA: Diagnosis not present

## 2020-09-29 DIAGNOSIS — Z79899 Other long term (current) drug therapy: Secondary | ICD-10-CM | POA: Diagnosis not present

## 2020-09-29 DIAGNOSIS — N2581 Secondary hyperparathyroidism of renal origin: Secondary | ICD-10-CM | POA: Diagnosis not present

## 2020-09-29 DIAGNOSIS — K219 Gastro-esophageal reflux disease without esophagitis: Secondary | ICD-10-CM | POA: Diagnosis not present

## 2020-09-29 DIAGNOSIS — Z5181 Encounter for therapeutic drug level monitoring: Secondary | ICD-10-CM | POA: Diagnosis not present

## 2020-09-29 DIAGNOSIS — D72819 Decreased white blood cell count, unspecified: Secondary | ICD-10-CM | POA: Diagnosis not present

## 2020-09-29 DIAGNOSIS — N186 End stage renal disease: Secondary | ICD-10-CM | POA: Diagnosis not present

## 2020-09-29 DIAGNOSIS — Z792 Long term (current) use of antibiotics: Secondary | ICD-10-CM | POA: Diagnosis not present

## 2020-09-29 DIAGNOSIS — N281 Cyst of kidney, acquired: Secondary | ICD-10-CM | POA: Diagnosis not present

## 2020-09-29 DIAGNOSIS — Z7952 Long term (current) use of systemic steroids: Secondary | ICD-10-CM | POA: Diagnosis not present

## 2020-09-29 DIAGNOSIS — Z940 Kidney transplant status: Secondary | ICD-10-CM | POA: Diagnosis not present

## 2020-10-06 ENCOUNTER — Ambulatory Visit (INDEPENDENT_AMBULATORY_CARE_PROVIDER_SITE_OTHER): Payer: Medicare Other | Admitting: Physician Assistant

## 2020-10-06 ENCOUNTER — Encounter: Payer: Self-pay | Admitting: Physician Assistant

## 2020-10-06 VITALS — BP 122/84 | HR 124 | Ht 64.0 in | Wt 184.8 lb

## 2020-10-06 DIAGNOSIS — Z940 Kidney transplant status: Secondary | ICD-10-CM

## 2020-10-06 DIAGNOSIS — R1010 Upper abdominal pain, unspecified: Secondary | ICD-10-CM

## 2020-10-06 DIAGNOSIS — R93429 Abnormal radiologic findings on diagnostic imaging of unspecified kidney: Secondary | ICD-10-CM

## 2020-10-06 DIAGNOSIS — K219 Gastro-esophageal reflux disease without esophagitis: Secondary | ICD-10-CM | POA: Diagnosis not present

## 2020-10-06 MED ORDER — OMEPRAZOLE 20 MG PO CPDR: 20.0000 mg | DELAYED_RELEASE_CAPSULE | Freq: Two times a day (BID) | ORAL | 6 refills | Status: DC

## 2020-10-06 MED ORDER — SUCRALFATE 1 GM/10ML PO SUSP: 1.0000 g | Freq: Every day | ORAL | 11 refills | Status: DC

## 2020-10-06 NOTE — Progress Notes (Signed)
Subjective:    Patient ID: Vanessa Alvarez, female    DOB: [birth year], 35 y.o.   MRN: [id number]  HPI Vanessa Alvarez is a 35 year old African-American female recently established here in April 2022 and seen by myself.  Also established with Dr. Havery Moros.  She comes in today for follow-up of chest and epigastric pain. She underwent upper endoscopy on 08/19/2020 with finding of a 1 cm hiatal hernia, no stricture or stenosis.  She did have some mucosal changes noted in the duodenal bulb however all biopsies of the esophagus stomach and duodenum were negative. Patient is status post cholecystectomy, she has history of end-stage renal disease for which she had been on dialysis with underlying FSGS.  She is now status post renal transplant August 2021 and has required chronic steroids and immunosuppression, she has history of mental retardation and affective disorder. Patient has been on 40 mg of omeprazole twice daily since her procedure and has also been taking the Carafate slurry a couple of times a day but does not necessarily feel that either of those are helping her much. She is somewhat of a difficult historian and complaints are vague.  She has been getting an occasional pain in her epigastrium which radiates into her back this seems to be sporadic and not related to meals and has been occurring over multiple months.  She occasionally has some nausea with this but no vomiting.  She is continuing to complain of some discomfort in her chest which she says will bother her some every day she points to the substernal area and the left lower chest as to areas of the pain.  She says this is a mild pain that will come and go and appears to be transient again does not appear to be associated with eating or swallowing and no change postprandially.  She has no current complaints of heartburn or indigestion, no dysphagia.  She apparently has had some shortness of breath with exertion long-term no recent changes Patient  had had prior cardiac evaluation which was negative. Dr. Havery Moros is suggested consideration of manometry or pH study if symptoms were persisting.  Review of Systems.Pertinent positive and negative review of systems were noted in the above HPI section.  All other review of systems was otherwise negative.   Outpatient Encounter Medications as of 10/06/2020  Medication Sig   AMLODIPINE BESYLATE PO Take 5 mg by mouth daily.   aripiprazole (ABILIFY) 10 MG disintegrating tablet Take 10 mg by mouth at bedtime.   aspirin EC 81 MG tablet Take 81 mg by mouth daily. Swallow whole.   busPIRone (BUSPAR) 10 MG tablet Take 10 mg by mouth 3 (three) times daily.   ferrous sulfate 325 (65 FE) MG tablet Take 1 tablet (325 mg total) by mouth 2 (two) times daily.   hydrOXYzine (ATARAX/VISTARIL) 25 MG tablet Take 25 mg by mouth 3 (three) times daily as needed.   lamoTRIgine (LAMICTAL) 200 MG tablet Take 100 mg by mouth every morning. Takes 1 tablet at night and 1/2 tablet in the AM   levothyroxine (SYNTHROID) 112 MCG tablet TAKE 1 TABLET BY MOUTH EVERY DAY BEFORE BREAKFAST   metoprolol succinate (TOPROL-XL) 25 MG 24 hr tablet Take 1 tablet by mouth daily.   mycophenolate (MYFORTIC) 180 MG EC tablet Take 180 mg by mouth See admin instructions. Take 3 tablets in the morning and 3 tablets in the evening   omeprazole (PRILOSEC) 20 MG capsule Take 1 capsule (20 mg total) by mouth 2 (two) times  daily before a meal.   polyethylene glycol powder (GLYCOLAX/MIRALAX) 17 GM/SCOOP powder Take 17 g by mouth daily as needed for mild constipation. Reported on 05/26/2015   predniSONE (DELTASONE) 5 MG tablet Take 5 mg by mouth daily.   sennosides-docusate sodium (SENOKOT-S) 8.6-50 MG tablet Take 1 tablet by mouth in the morning and at bedtime.   sertraline (ZOLOFT) 100 MG tablet Take 100 mg by mouth daily.    Tacrolimus (ENVARSUS XR PO) Take 7 mg by mouth daily.   [DISCONTINUED] omeprazole (PRILOSEC) 40 MG capsule Take 40 mg by  mouth 2 (two) times daily before a meal.   [DISCONTINUED] sucralfate (CARAFATE) 1 GM/10ML suspension Take 1 g by mouth 4 (four) times daily -  with meals and at bedtime.   sucralfate (CARAFATE) 1 GM/10ML suspension Take 10 mLs (1 g total) by mouth at bedtime.   [DISCONTINUED] sucralfate (CARAFATE) 1 GM/10ML suspension Take 10 mLs (1 g total) by mouth 4 (four) times daily -  with meals and at bedtime.   No facility-administered encounter medications on file as of 10/06/2020.   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   Headache 07/13/2011   ECZEMA, ATOPIC 11/05/2009   Hypothyroidism 03/12/2009   Obesity, Class II, BMI 35-39.9 02/26/2009   Essential hypertension 02/26/2009   Iron deficiency anemia 05/20/2008   Anxiety state 06/23/2006   MENTAL RETARDATION 06/23/2006   Social History   Socioeconomic History   Marital status: Single    Spouse name: Not on file   Number of children: 0   Years of education: Not on file   Highest education level: Not on file  Occupational History   Occupation: disabled  Tobacco Use   Smoking status: Never   Smokeless tobacco: Never  Vaping Use   Vaping Use: Never used  Substance and Sexual Activity   Alcohol use: No    Alcohol/week: 0.0 standard drinks   Drug use: No   Sexual activity: Never  Other Topics Concern   Not on file  Social History Narrative   ** Merged History Encounter **        Lives with mother.   Very sedentary livestyle.   No etoh, tobacco, drugs, intercourse.      MR.    Social Determinants of Health   Financial Resource Strain: Not on file  Food Insecurity: Not on file  Transportation Needs: Not on file  Physical Activity: Not on file  Stress: Not on file  Social Connections: Not on file  Intimate Partner Violence: Not on file    Ms. Fare's family history includes Bladder Cancer in her maternal grandmother; Cancer in her maternal grandfather; Diabetes in her maternal grandfather and maternal grandmother; Heart disease in her maternal grandmother; Hypertension in her maternal grandmother and mother.      Objective:    Vitals:   10/06/20 0958  BP: 122/84  Pulse: (!) 124  SpO2: 98%    Physical Exam.Well-developed well-nourished African-American female  in no acute distress.  Companied by her mother height, Weight, 184 BMI 81.7  HEENT; nontraumatic normocephalic, EOMI, PE R LA, sclera anicteric. Oropharynx; not examined today Neck; supple, no JVD Cardiovascular; regular rate and rhythm with S1-S2, no murmur rub or gallop Pulmonary; Clear bilaterally Abdomen; soft, obese, no focal tenderness nondistended, large incisional scar on the right lower quadrant with implanted kidney, no palpable mass or hepatosplenomegaly, bowel sounds are active Rectal; not done today Skin; benign exam, no jaundice rash or appreciable lesions Extremities; no clubbing cyanosis or edema skin warm and dry Neuro/Psych; alert and oriented x4, grossly nonfocal mood and affect flat       Assessment & Plan:   #26 35 year old African-American female status post renal transplant August 2021 secondary to FSGS who comes in today with persistent vague complaints of intermittent chest discomfort and intermittent epigastric discomfort which may radiate around to the back.  No current complaints of dysphagia or odynophagia. No improvement in symptoms on high-dose PPI. Recent  EGD with small hiatal hernia, no stricture or stenosis and no significant gastropathy or ulcer disease. She is status post cholecystectomy  Etiology of her symptoms is not clear.  I am not convinced that her pains are secondary to GERD or her esophagus though possible. Query whether she may have some dyspepsia/abdominal pain symptoms secondary to medications Rule out other intra-abdominal pathology.  #2 mental retardation #3.  Affect of disorder #4 chronic immunosuppression  Plan; will decrease omeprazole to 20 mg p.o. twice daily AC meals Okay to continue Carafate suspension 1 g between meals if she finds this helpful. CBC with differential and be met will schedule for CT of the chest and CT abdomen. If these are unremarkable can consider manometry/pH study though suspect this may be low yield. Patient will follow-up with Dr. Havery Moros.   Genia Harold PA-C 10/06/2020   Cc: Cleophas Dunker, *

## 2020-10-06 NOTE — Patient Instructions (Addendum)
If you are age 35 or younger, your body mass index should be between 19-25. Your Body mass index is 31.72 kg/m. If this is out of the aformentioned range listed, please consider follow up with your Primary Care Provider.  __________________________________________________________  The [facility] providers would like to encourage you to use [facility] to communicate with providers for non-urgent requests or questions.  Due to long hold times on the telephone, sending your provider a message by [facility] may be a faster and more efficient way to get a response.  Please allow 48 business hours for a response.  Please remember that this is for non-urgent requests.   You have been scheduled for a CT scan of the abdomen at [facility], 1st floor Radiology. You are scheduled on 10/14/2020  at 9:00 am. You should arrive 15 minutes prior to your appointment time for registration.  Please pick up 2 bottles of contrast from [facility] at least 3 days prior to your scan. The solution may taste better if refrigerated, but do NOT add ice or any other liquid to this solution. Shake well before drinking.   Please follow the written instructions below on the day of your exam:   1) Do not eat anything after midnight   2) Drink 1 bottle of contrast @ 7:00 am (2 hours prior to your exam)  Remember to shake well before drinking and do NOT pour over ice.     Drink 1 bottle of contrast @ 8:00 am (1 hour prior to your exam)   You may take any medications as prescribed with a small amount of water, if necessary. If you take any of the following medications: METFORMIN, GLUCOPHAGE, GLUCOVANCE, AVANDAMET, RIOMET, FORTAMET, [place] MET, JANUMET, GLUMETZA or METAGLIP, you MAY be asked to HOLD this medication 48 hours AFTER the exam.   The purpose of you drinking the oral contrast is to aid in the visualization of your intestinal tract. The contrast solution may cause some diarrhea. Depending on your individual set of  symptoms, you may also receive an intravenous injection of x-ray contrast/dye. Plan on being at [facility] for 45 minutes or longer, depending on the type of exam you are having performed.   If you have any questions regarding your exam or if you need to reschedule, you may call [facility] at [phone] between the hours of 8:00 am and 5:00 pm, Monday-Friday.   Continue Omprazole decreased to 20 mg 1 capsule twice daily Continue Carafate slurry 1 gram at bedtime.  You have been scheduled to follow up with Dr. Havery Moros on 12/05/2020 at 3:00 pm  Thank you for entrusting me with your care and choosing [facility].  Amy Esterwood, PA-C

## 2020-10-07 NOTE — Progress Notes (Signed)
Agree with assessment and plan as outlined.  

## 2020-10-10 DIAGNOSIS — F71 Moderate intellectual disabilities: Secondary | ICD-10-CM | POA: Diagnosis not present

## 2020-10-10 DIAGNOSIS — F422 Mixed obsessional thoughts and acts: Secondary | ICD-10-CM | POA: Diagnosis not present

## 2020-10-10 DIAGNOSIS — F319 Bipolar disorder, unspecified: Secondary | ICD-10-CM | POA: Diagnosis not present

## 2020-10-10 DIAGNOSIS — F329 Major depressive disorder, single episode, unspecified: Secondary | ICD-10-CM | POA: Diagnosis not present

## 2020-10-14 ENCOUNTER — Other Ambulatory Visit: Payer: Self-pay

## 2020-10-14 ENCOUNTER — Encounter (HOSPITAL_COMMUNITY): Payer: Self-pay

## 2020-10-14 ENCOUNTER — Ambulatory Visit (HOSPITAL_COMMUNITY)
Admission: RE | Admit: 2020-10-14 | Discharge: 2020-10-14 | Disposition: A | Discharge status: Home / Self Care | Payer: Medicare Other | Source: Ambulatory Visit | Attending: Physician Assistant | Admitting: Physician Assistant

## 2020-10-14 DIAGNOSIS — K219 Gastro-esophageal reflux disease without esophagitis: Secondary | ICD-10-CM

## 2020-10-14 DIAGNOSIS — I517 Cardiomegaly: Secondary | ICD-10-CM | POA: Diagnosis not present

## 2020-10-14 DIAGNOSIS — R1010 Upper abdominal pain, unspecified: Secondary | ICD-10-CM | POA: Diagnosis not present

## 2020-10-14 DIAGNOSIS — R93429 Abnormal radiologic findings on diagnostic imaging of unspecified kidney: Secondary | ICD-10-CM

## 2020-10-14 DIAGNOSIS — N261 Atrophy of kidney (terminal): Secondary | ICD-10-CM | POA: Diagnosis not present

## 2020-10-14 DIAGNOSIS — Z940 Kidney transplant status: Secondary | ICD-10-CM

## 2020-10-14 DIAGNOSIS — R079 Chest pain, unspecified: Secondary | ICD-10-CM | POA: Diagnosis not present

## 2020-10-19 NOTE — Progress Notes (Signed)
Cardiology Office Note Date:  10/19/2020  Patient ID:  Vanessa Alvarez, Vanessa Alvarez [birth year], MRN [id number] PCP:  Cleophas Dunker, DO  Cardiologist:  Dr. Irish Lack (2018) Electrophysiologist: Dr. Quentin Ore    Chief Complaint: reports of an enlarged heart on CT  History of Present Illness: Vanessa Alvarez is a 35 y.o. female with history of autism, MR, anxiety/depression, GERD, HTN, HLD, obesity, ESRF/HD >> renal transplant Aug 2021 on chronic immunosupression.  She comes in today to be seen for Dr. Quentin Ore, last seen by him 07/08/20 to follow up on her c/o CP. W/u including a CT chest, negative ETT,  and unremarkable echo were discussed.  Her CP felt to be atypical/non-cardiac and recommended to f/u with her PMD for non-cardiac etiologies and with him PRN.  She saw GI 10/07/2119 for the same, their note reports recent EGD with small hiatal hernia, no stricture or stenosis and no significant gastropathy or ulcer disease. She is status post cholecystectomy, planned for CT of the chest and CT abdomen. If these are unremarkable can consider manometry/pH study.  TODAY She is accompanied by her mom. Her mom says that when they were gien the reports of her scans they mentioned that her heart was enlarged and wanted to follow up on this given her CP.  The patient reports her CP  Located to low L rib border and central chest, they are sperate pains do not always happen together but can. The pain is a pin in size and momentary in duration but are very painful and she says when happens she will yell out in pain. It is not positional or exertional, sometimes seems to happen after eating but not always Happens perhaps every other day now for about a year and days that she has it can be several times a day  No SOB generally but some days seems to get a little breathless sometimes No syncope   Past Medical History:  Diagnosis Date   Anemia    Iron Def   Anxiety    Autism spectrum    Behavioral  problems    Depression    End stage renal disease ([place])    GERD (gastroesophageal reflux disease)    Hyperlipidemia    Hypertension    HYPOTHYROIDISM 03/12/2009   Qualifier: Diagnosis of  By: Ta MD, Cat     MENTAL RETARDATION 06/23/2006   Qualifier: Diagnosis of  By: Eusebio Friendly     Mood disorder [facility])    Sees Dr Silvio Pate, who prescribes meds   Shortness of breath dyspnea    with exertion    Past Surgical History:  Procedure Laterality Date   AV FISTULA PLACEMENT Left 03/17/2015   Procedure: CREATION OF LEFT BRACHIOCEPHALIC ARTERIOVENOUS (AV) FISTULA ;  Surgeon: Conrad Morris, MD;  Location: [facility];  Service: Vascular;  Laterality: Left;   CHOLECYSTECTOMY  11/2004   INSERTION OF DIALYSIS CATHETER Right 03/17/2015   Procedure: INSERTION OF DIALYSIS CATHETER RIGHT INTERNAL JUIGULAR PLACEMENT;  Surgeon: Conrad Cohasset, MD;  Location: [facility];  Service: Vascular;  Laterality: Right;    Current Outpatient Medications  Medication Sig Dispense Refill   AMLODIPINE BESYLATE PO Take 5 mg by mouth daily.     aripiprazole (ABILIFY) 10 MG disintegrating tablet Take 10 mg by mouth at bedtime.     aspirin EC 81 MG tablet Take 81 mg by mouth daily. Swallow whole.     busPIRone (BUSPAR) 10 MG tablet Take 10 mg by mouth 3 (three) times  daily.     ferrous sulfate 325 (65 FE) MG tablet Take 1 tablet (325 mg total) by mouth 2 (two) times daily. 180 tablet 3   hydrOXYzine (ATARAX/VISTARIL) 25 MG tablet Take 25 mg by mouth 3 (three) times daily as needed.     lamoTRIgine (LAMICTAL) 200 MG tablet Take 100 mg by mouth every morning. Takes 1 tablet at night and 1/2 tablet in the AM     levothyroxine (SYNTHROID) 112 MCG tablet TAKE 1 TABLET BY MOUTH EVERY DAY BEFORE BREAKFAST 30 tablet 3   metoprolol succinate (TOPROL-XL) 25 MG 24 hr tablet Take 1 tablet by mouth daily.     mycophenolate (MYFORTIC) 180 MG EC tablet Take 180 mg by mouth See admin instructions. Take 3 tablets in the morning and 3 tablets in the  evening     omeprazole (PRILOSEC) 20 MG capsule Take 1 capsule (20 mg total) by mouth 2 (two) times daily before a meal. 60 capsule 6   polyethylene glycol powder (GLYCOLAX/MIRALAX) 17 GM/SCOOP powder Take 17 g by mouth daily as needed for mild constipation. Reported on 05/26/2015 255 g 3   predniSONE (DELTASONE) 5 MG tablet Take 5 mg by mouth daily.     sennosides-docusate sodium (SENOKOT-S) 8.6-50 MG tablet Take 1 tablet by mouth in the morning and at bedtime.     sertraline (ZOLOFT) 100 MG tablet Take 100 mg by mouth daily.      sucralfate (CARAFATE) 1 GM/10ML suspension Take 10 mLs (1 g total) by mouth at bedtime. 30 mL 11   Tacrolimus (ENVARSUS XR PO) Take 7 mg by mouth daily.     No current facility-administered medications for this visit.    Allergies:   Patient has no known allergies.   Social History:  The patient  reports that she has never smoked. She has never used smokeless tobacco. She reports that she does not drink alcohol and does not use drugs.   Family History:  The patient's family history includes Bladder Cancer in her maternal grandmother; Cancer in her maternal grandfather; Diabetes in her maternal grandfather and maternal grandmother; Heart disease in her maternal grandmother; Hypertension in her maternal grandmother and mother.  ROS:  Please see the history of present illness.    All other systems are reviewed and otherwise negative.   PHYSICAL EXAM:  VS:  LMP 10/06/2020  BMI: There is no height or weight on file to calculate BMI. Well nourished, well developed, in no acute distress HEENT: normocephalic, atraumatic Neck: no JVD, carotid bruits or masses Cardiac: RRR; no significant murmurs, no rubs, or gallops Lungs:  CTA b/l, no wheezing, rhonchi or rales Abd: soft, nontender MS: no deformity or atrophy Ext: no edema Skin: warm and dry, no rash Neuro:  No gross deficits appreciated Psych: euthymic mood, full affect   EKG:  not done today  June 02, 2020 [facility]  The left ventricular size is normal.  Left ventricular systolic function is normal.  LV ejection fraction = 60-65%.  The right ventricle is normal in size and function.  The left atrium is mildly dilated.  There is mild to moderate mitral regurgitation.  No significant stenosis seen  There was insufficient TR detected to calculate RV systolic pressure.  Estimated right atrial pressure is 5 mmHg.Marland Kitchen  There is no pericardial effusion.   05/15/2020: ETT Heart rate at rest 108 bpm, at peak stress 139 bpm There was no ST segment deviation noted during stress. No electrocardiographic evidence of ischemia noted  at heart rate achieved (75%). She was not able to achieve target heart rate secondary to fatigue and shortness of breath.    March 27, 2020  CT chest abdomen pelvis 1.  Right iliac fossa renal transplant with mildly edematous appearance and surrounding perinephric stranding raising concern for infection. Consider correlation with urinalysis. No overt hydronephrosis or large perinephric collections.  2.  Redemonstrated enlarged leiomyomatous uterus.  3.  Additional findings as above.  Recent Labs: No results found for requested labs within last 8760 hours.  No results found for requested labs within last 8760 hours.   CrCl cannot be calculated (Patient's most recent lab result is older than the maximum 21 days allowed.).   Wt Readings from Last 3 Encounters:  10/06/20 184 lb 12.8 oz (83.8 kg)  08/19/20 177 lb (80.3 kg)  08/15/20 177 lb 12.8 oz (80.6 kg)     Other studies reviewed: Additional studies/records reviewed today include: summarized above  ASSESSMENT AND PLAN:  CP Very atypical of cardiac etiology W/u as noted above and non-cardiac GI w/u is ongoing She has some degree of scoliosis, ? Musculoskeletal  2. Cardiomegaly on CT LV normal in size and function by echo  They will continue w/u and evaluation with her PMD and GI    Disposition: F/u  with [place] prn  Current medicines are reviewed at length with the patient today.  The patient did not have any concerns regarding medicines.  Venetia Night, PA-C 10/19/2020 4:54 PM     [address] [place] [address] [phone] (office)  [phone] (fax)

## 2020-10-21 ENCOUNTER — Encounter: Payer: Self-pay | Admitting: Physician Assistant

## 2020-10-21 ENCOUNTER — Ambulatory Visit (INDEPENDENT_AMBULATORY_CARE_PROVIDER_SITE_OTHER): Payer: Medicare Other | Admitting: Physician Assistant

## 2020-10-21 ENCOUNTER — Other Ambulatory Visit: Payer: Self-pay | Admitting: Family Medicine

## 2020-10-21 ENCOUNTER — Other Ambulatory Visit: Payer: Self-pay

## 2020-10-21 VITALS — BP 130/80 | HR 82 | Ht 64.0 in | Wt 187.0 lb

## 2020-10-21 DIAGNOSIS — E039 Hypothyroidism, unspecified: Secondary | ICD-10-CM

## 2020-10-21 DIAGNOSIS — R0789 Other chest pain: Secondary | ICD-10-CM | POA: Diagnosis not present

## 2020-10-21 NOTE — Telephone Encounter (Signed)
Needs appointment for TSH

## 2020-10-21 NOTE — Patient Instructions (Signed)
Medication Instructions:   Your physician recommends that you continue on your current medications as directed. Please refer to the Current Medication list given to you today.  *If you need a refill on your cardiac medications before your next appointment, please call your pharmacy*   Lab Work:   Page   If you have labs (blood work) drawn today and your tests are completely normal, you will receive your results only by: [place] (if you have MyChart) OR A paper copy in the mail If you have any lab test that is abnormal or we need to change your treatment, we will call you to review the results.   Testing/Procedures: NONE ORDERED  TODAY   Follow-Up: At [facility], you and your health needs are our priority.  As part of our continuing mission to provide you with exceptional heart care, we have created designated Provider Care Teams.  These Care Teams include your primary Cardiologist (physician) and Advanced Practice Providers (APPs -  Physician Assistants and Nurse Practitioners) who all work together to provide you with the care you need, when you need it.  We recommend signing up for the patient portal called "MyChart".  Sign up information is provided on this After Visit Summary.  MyChart is used to connect with patients for Virtual Visits (Telemedicine).  Patients are able to view lab/test results, encounter notes, upcoming appointments, etc.  Non-urgent messages can be sent to your provider as well.   To learn more about what you can do with MyChart, go to [email or web address].    Your next appointment:  The format for your next appointment:   In [place]

## 2020-10-29 DIAGNOSIS — Z79899 Other long term (current) drug therapy: Secondary | ICD-10-CM | POA: Diagnosis not present

## 2020-10-29 DIAGNOSIS — Z792 Long term (current) use of antibiotics: Secondary | ICD-10-CM | POA: Diagnosis not present

## 2020-10-29 DIAGNOSIS — D649 Anemia, unspecified: Secondary | ICD-10-CM | POA: Diagnosis not present

## 2020-10-29 DIAGNOSIS — Z940 Kidney transplant status: Secondary | ICD-10-CM | POA: Diagnosis not present

## 2020-10-29 DIAGNOSIS — I10 Essential (primary) hypertension: Secondary | ICD-10-CM | POA: Diagnosis not present

## 2020-10-29 DIAGNOSIS — K219 Gastro-esophageal reflux disease without esophagitis: Secondary | ICD-10-CM | POA: Diagnosis not present

## 2020-10-29 DIAGNOSIS — Z4822 Encounter for aftercare following kidney transplant: Secondary | ICD-10-CM | POA: Diagnosis not present

## 2020-10-29 DIAGNOSIS — D72819 Decreased white blood cell count, unspecified: Secondary | ICD-10-CM | POA: Diagnosis not present

## 2020-10-29 DIAGNOSIS — Z7952 Long term (current) use of systemic steroids: Secondary | ICD-10-CM | POA: Diagnosis not present

## 2020-10-29 DIAGNOSIS — D849 Immunodeficiency, unspecified: Secondary | ICD-10-CM | POA: Diagnosis not present

## 2020-10-31 DIAGNOSIS — F422 Mixed obsessional thoughts and acts: Secondary | ICD-10-CM | POA: Diagnosis not present

## 2020-10-31 DIAGNOSIS — F329 Major depressive disorder, single episode, unspecified: Secondary | ICD-10-CM | POA: Diagnosis not present

## 2020-10-31 DIAGNOSIS — F319 Bipolar disorder, unspecified: Secondary | ICD-10-CM | POA: Diagnosis not present

## 2020-10-31 DIAGNOSIS — F71 Moderate intellectual disabilities: Secondary | ICD-10-CM | POA: Diagnosis not present

## 2020-11-13 ENCOUNTER — Other Ambulatory Visit: Payer: Self-pay

## 2020-11-13 ENCOUNTER — Ambulatory Visit (INDEPENDENT_AMBULATORY_CARE_PROVIDER_SITE_OTHER): Payer: Medicare Other | Admitting: Family Medicine

## 2020-11-13 VITALS — BP 148/86 | HR 94 | Wt 191.6 lb

## 2020-11-13 DIAGNOSIS — R079 Chest pain, unspecified: Secondary | ICD-10-CM

## 2020-11-13 NOTE — Progress Notes (Signed)
    SUBJECTIVE:   CHIEF COMPLAINT / HPI:   Chest pain Patient reports with her mother.  Her mother reports that her chest pain has continued.  This has been going on for approximately 7 months.  She has been evaluated by cardiology who completed an echo and found no abnormalities.  She is also been evaluated by gastroenterology who obtained CT of abdomen and chest as well as upper endoscopy.  Patient reports that the pain starts just left of the middle of her chest and radiates under her left breast.  Is not associated with exercise or work.  Is not associated with laying down.  Is not associated with any certain foods.  Is not tender to palpation.  Denies any pain with breathing.  Denies any sick symptoms.  Cannot identify anything that makes it worse or better.   OBJECTIVE:   BP (!) 148/86   Pulse 94   Wt 191 lb 9.6 oz (86.9 kg)   SpO2 99%   BMI 32.89 kg/m   General: Well-appearing 35 year old female, intellectually disabled Cardiac: Regular rate and rhythm, no murmurs appreciated Respiratory: Normal work of breathing, lungs clear to auscultation bilaterally Abdomen: Soft, nontender, positive bowel sounds MSK: No gross abnormalities, no chest wall tenderness, no back tenderness, no costovertebral angle tenderness  ASSESSMENT/PLAN:   Left-sided chest pain Patient reporting chest pain today.  Unclear etiology.  Physical exam was reassuring.  She has had an extensive work-up from cardiology as well as gastroenterology.  Follow-up GI appointment scheduled in August.  Specialist feel it could be MSK in origin.  Patient does have scoliosis.  Will refer to [facility] for evaluation.  Patient will continue omeprazole as prescribed by gastroenterology.  Strict ED and return precautions given.     Gifford Shave, MD [place]

## 2020-11-13 NOTE — Patient Instructions (Signed)
It was great seeing you today.  I am sorry you are having continued issues with this chest pain.  I am still not sure what is causing it but we have ruled out heart issues.  You are still following with gastroenterology and please be sure to follow-up at their appointment on August 12.  I have placed a referral for physical medicine rehabilitation and someone will call you to schedule an appointment.  They can evaluate you and hopefully help if there is some musculoskeletal issue causing this pain.  If you have any questions or concerns please feel free to call the clinic.  I hope you have a wonderful afternoon!

## 2020-11-14 DIAGNOSIS — F71 Moderate intellectual disabilities: Secondary | ICD-10-CM | POA: Diagnosis not present

## 2020-11-14 DIAGNOSIS — F422 Mixed obsessional thoughts and acts: Secondary | ICD-10-CM | POA: Diagnosis not present

## 2020-11-14 DIAGNOSIS — F319 Bipolar disorder, unspecified: Secondary | ICD-10-CM | POA: Diagnosis not present

## 2020-11-14 NOTE — Assessment & Plan Note (Signed)
>>  ASSESSMENT AND PLAN FOR LEFT-SIDED CHEST PAIN WRITTEN ON 11/14/2020 10:45 AM BY Derrel Nip, MD  Patient reporting chest pain today.  Unclear etiology.  Physical exam was reassuring.  She has had an extensive work-up from cardiology as well as gastroenterology.  Follow-up GI appointment scheduled in August.  Specialist feel it could be MSK in origin.  Patient does have scoliosis.  Will refer to [facility] for evaluation.  Patient will continue omeprazole as prescribed by gastroenterology.  Strict ED and return precautions given.

## 2020-11-14 NOTE — Assessment & Plan Note (Signed)
Patient reporting chest pain today.  Unclear etiology.  Physical exam was reassuring.  She has had an extensive work-up from cardiology as well as gastroenterology.  Follow-up GI appointment scheduled in August.  Specialist feel it could be MSK in origin.  Patient does have scoliosis.  Will refer to [facility] for evaluation.  Patient will continue omeprazole as prescribed by gastroenterology.  Strict ED and return precautions given.

## 2020-11-26 ENCOUNTER — Ambulatory Visit (INDEPENDENT_AMBULATORY_CARE_PROVIDER_SITE_OTHER): Payer: Medicare Other | Admitting: Physical Medicine and Rehabilitation

## 2020-11-26 ENCOUNTER — Ambulatory Visit (INDEPENDENT_AMBULATORY_CARE_PROVIDER_SITE_OTHER): Payer: Medicare Other

## 2020-11-26 ENCOUNTER — Other Ambulatory Visit: Payer: Self-pay

## 2020-11-26 ENCOUNTER — Encounter: Payer: Self-pay | Admitting: Physical Medicine and Rehabilitation

## 2020-11-26 VITALS — BP 126/81 | HR 86

## 2020-11-26 DIAGNOSIS — M41115 Juvenile idiopathic scoliosis, thoracolumbar region: Secondary | ICD-10-CM | POA: Diagnosis not present

## 2020-11-26 DIAGNOSIS — M7918 Myalgia, other site: Secondary | ICD-10-CM | POA: Diagnosis not present

## 2020-11-26 DIAGNOSIS — R0781 Pleurodynia: Secondary | ICD-10-CM

## 2020-11-26 NOTE — Progress Notes (Signed)
Vanessa Alvarez - 35 y.o. female MRN [id number]  Date of birth: 03/03/1986  Office Visit Note: Visit Date: 11/26/2020 PCP: [name], MD Referred by: [name], MD  Subjective: Chief Complaint  Patient presents with   Chest - Pain   Neck - Pain   HPI: Vanessa Alvarez is a 35 y.o. female who comes in today per the request of Dr. Dorris Singh for evaluation of chronic, worsening and severe left sided rib pain. Patient's mother present and provides most of the history.  Patient with some communication difficulty. Patient reports left lower anterior lateral rib pain has increased over the last several months, describes as sharp sensation and rates as 8 out of 10. Patient reports pain increases with movement and activity. Patient reports some pain relief with stretching exercises. Patient's CT scan of abdomen from June exhibits S-shaped scoliotic curvature of the thoracolumbar spine.  She locates most of the pain below the left breast more lateral lower chest and rib area.  She also has some neck pain and shoulder pain as well.  Patient has recently been seen by Gastroenterology and Cardiology for same issues, negative work-up for both. Patient's mother reports she was followed for her scoliosis when she was younger, but has not seen anyone recently. Mother also states she did wear a brace during her teenage years.  The mother reports that she is not really been followed for the scoliosis since that time.  Patient states she can tell that her pain is getting worse and reports she is not being very active because she feels bad. Patient has history of right sided kidney transplant in 2021, performed at [facility] where she is continuing to be followed by Artist Pais PA-C with abdominal organ transplant. Patient denies focal weakness, numbness and tingling. Patient denies recent trauma or falls.  Patient's course is complicated by kidney transplant, hypertension, autism, affective disorder,  anxiety and intellectual disability.   Review of Systems  Musculoskeletal:  Positive for back pain, myalgias and neck pain.       Left sided rib pain  Neurological:  Negative for tremors and focal weakness.  All other systems reviewed and are negative. Otherwise per HPI.  Assessment & Plan: Visit Diagnoses:    ICD-10-CM   1. Juvenile idiopathic scoliosis of thoracolumbar region  M41.115 XR SCOLIOSIS EVAL COMPLETE SPINE 1 VIEW    Ambulatory referral to Physical Therapy    Ambulatory referral to Orthopedic Surgery    2. Rib pain on left side  R07.81     3. Myofascial pain syndrome  M79.18        Plan: Findings:  Chronic, worsening and severe left-sided rib pain.  Per patient's recent abdominal CT, there is an S-shaped scoliotic curvature of the thoracolumbar spine.  Patient continues to have severe pain and states that nothing seems to help her pain long term.  Although, she really has not tried much in the way of pain relief other than just stretching on her own.  She has not really tried a lot of medications or topicals or therapy.  I did obtain scoliosis films today.  There was some difficulty getting a complete scoliosis film today Duda machine limits however we did get good overall view of the curvature.  Cobb angles assured were approximately 29.4 in the thoracic area and Cobb angle in the lumbar area measured today at approximately 37.5.  Compared to lumbar spine x-ray dated 2018 with lower curve Cobb angle measured at approximately  31.5.  Compared to chest x-ray dated 2014 with Cobb angle approximately 27.8.  I do not feel that this pain is radicular in nature but likely mechanical from the curvature.  We placed referral today for in house physical therapy with focus on posture and strengthening. We also placed referral to Dr. Basil Dess for continued evaluation of scoliosis given that the angle measurements have increased to some degree.  He is more well versed on complex scoliosis than  I am..  Patient also instructed to use over-the-counter Voltaren gel and Lidocaine patches for pain relief at home.  If patient does not get significant pain relief with physical therapy and medication regimen at home we will then consider performing a thoracic MRI. Patient encouraged to continue conservative therapy and stretching exercises at home.  No red flag symptoms noted upon exam.    Meds & Orders: No orders of the defined types were placed in this encounter.   Orders Placed This Encounter  Procedures   XR SCOLIOSIS EVAL COMPLETE SPINE 1 VIEW   Ambulatory referral to Physical Therapy   Ambulatory referral to Orthopedic Surgery    Follow-up: No follow-ups on file.   Procedures: No procedures performed      Clinical History: No specialty comments available.   She reports that she has never smoked. She has never used smokeless tobacco. No results for input(s): HGBA1C, LABURIC in the last 8760 hours.  Objective:  VS:  HT:    WT:   BMI:     BP:126/81  HR:86bpm  TEMP: ( )  RESP:  Physical Exam Constitutional:      Appearance: She is obese.  HENT:     Head: Normocephalic and atraumatic.     Right Ear: Tympanic membrane normal.     Left Ear: Tympanic membrane normal.     Nose: Nose normal.     Mouth/Throat:     Mouth: Mucous membranes are moist.  Eyes:     Pupils: Pupils are equal, round, and reactive to light.  Cardiovascular:     Rate and Rhythm: Normal rate.     Pulses: Normal pulses.  Pulmonary:     Effort: Pulmonary effort is normal.  Abdominal:     General: Abdomen is flat. There is no distension.  Musculoskeletal:     Cervical back: Normal range of motion and neck supple. Tenderness present.     Comments: Examination of the spine shows obvious thoracolumbar scoliotic curvature.  No pain noted upon palpation of left rib cage. 5/5 strength noted to bilateral upper and lower extremities. Full range of motion with both active and passive hip flexion/extension,  internal/external rotation, adduction/abduction. Sensation intact bilaterally. No tenderness noted upon palpation of cervical, thoracic and lumbar spinous processes. Walks independently, gait steady.   Skin:    General: Skin is warm and dry.     Capillary Refill: Capillary refill takes less than 2 seconds.  Neurological:     General: No focal deficit present.     Mental Status: She is alert.     Sensory: No sensory deficit.     Motor: No weakness.     Gait: Gait normal.  Psychiatric:        Mood and Affect: Mood normal.    Ortho Exam  Imaging: Scoliosis complete spine x-ray:  Significant thoracolumbar scoliosis left convex in the lumbar spine with  some rotatory component.  Cobb angle in the thoracic region measured today  approximately 29.4 and Cobb angle in the lumbar area measured  today at  approximately 37.5.  Compared to lumbar spine x-ray dated 2018 with lower  curve Cobb angle measured at approximately 31.5.  Compared to chest x-ray  dated 2014 with Cobb angle approximately 27.8.  Past Medical/Family/Surgical/Social History: Medications & Allergies reviewed per EMR, new medications updated. 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   Nausea & vomiting 02/12/2015   ESRD on dialysis ([place])    Nystagmus 08/22/2013   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   Headache 07/13/2011   ECZEMA, ATOPIC 11/05/2009   Hypothyroidism  03/12/2009   Obesity, Class II, BMI 35-39.9 02/26/2009   Essential hypertension 02/26/2009   Iron deficiency anemia 05/20/2008   Anxiety state 06/23/2006   MENTAL RETARDATION 06/23/2006   Past Medical History:  Diagnosis Date   Anemia    Iron Def   Anxiety    Autism spectrum    Behavioral problems    Depression    End stage renal disease ([place])    GERD (gastroesophageal reflux disease)    Hyperlipidemia    Hypertension    HYPOTHYROIDISM 03/12/2009   Qualifier: Diagnosis of  By: Ta MD, Cat     MENTAL RETARDATION 06/23/2006   Qualifier: Diagnosis of  By: Eusebio Friendly     Mood disorder [facility])    Sees Dr Silvio Pate, who prescribes meds   Shortness of breath dyspnea    with exertion   Family History  Problem Relation Age of Onset   Hypertension Mother    Diabetes Maternal Grandmother    Hypertension Maternal Grandmother    Heart disease Maternal Grandmother    Bladder Cancer Maternal Grandmother    Diabetes Maternal Grandfather    Cancer Maternal Grandfather        prostate   Colon cancer Neg Hx    Pancreatic cancer Neg Hx    Esophageal cancer Neg Hx    Liver disease Neg Hx    Stomach cancer Neg Hx    Rectal cancer Neg Hx    Past Surgical History:  Procedure Laterality Date   AV FISTULA PLACEMENT Left 03/17/2015   Procedure: CREATION OF LEFT BRACHIOCEPHALIC ARTERIOVENOUS (AV) FISTULA ;  Surgeon: Conrad Edgar, MD;  Location: [facility] OR;  Service: Vascular;  Laterality: Left;   CHOLECYSTECTOMY  11/2004   INSERTION OF DIALYSIS CATHETER Right 03/17/2015   Procedure: INSERTION OF DIALYSIS CATHETER RIGHT INTERNAL JUIGULAR PLACEMENT;  Surgeon: Conrad Delta, MD;  Location: [facility] OR;  Service: Vascular;  Laterality: Right;   Social History   Occupational History   Occupation: disabled  Tobacco Use   Smoking status: Never   Smokeless tobacco: Never  Vaping Use   Vaping Use: Never used  Substance and Sexual Activity   Alcohol use: No    Alcohol/week: 0.0 standard drinks   Drug  use: No   Sexual activity: Never

## 2020-11-26 NOTE — Progress Notes (Signed)
Left anterior chest pain below left breast. Left sided neck pain with some pain in left shoulder. Pain is always there.  Numeric Pain Rating Scale and Functional Assessment Average Pain 8   In the last MONTH (on 0-10 scale) has pain interfered with the following?  1. General activity like being  able to carry out your everyday physical activities such as walking, climbing stairs, carrying groceries, or moving a chair?  Rating(1)

## 2020-11-28 DIAGNOSIS — F329 Major depressive disorder, single episode, unspecified: Secondary | ICD-10-CM | POA: Diagnosis not present

## 2020-11-28 DIAGNOSIS — F422 Mixed obsessional thoughts and acts: Secondary | ICD-10-CM | POA: Diagnosis not present

## 2020-11-28 DIAGNOSIS — F71 Moderate intellectual disabilities: Secondary | ICD-10-CM | POA: Diagnosis not present

## 2020-12-01 DIAGNOSIS — N051 Unspecified nephritic syndrome with focal and segmental glomerular lesions: Secondary | ICD-10-CM | POA: Diagnosis not present

## 2020-12-01 DIAGNOSIS — F79 Unspecified intellectual disabilities: Secondary | ICD-10-CM | POA: Diagnosis not present

## 2020-12-01 DIAGNOSIS — I10 Essential (primary) hypertension: Secondary | ICD-10-CM | POA: Diagnosis not present

## 2020-12-01 DIAGNOSIS — D849 Immunodeficiency, unspecified: Secondary | ICD-10-CM | POA: Diagnosis not present

## 2020-12-01 DIAGNOSIS — Z5111 Encounter for antineoplastic chemotherapy: Secondary | ICD-10-CM | POA: Diagnosis not present

## 2020-12-01 DIAGNOSIS — K219 Gastro-esophageal reflux disease without esophagitis: Secondary | ICD-10-CM | POA: Diagnosis not present

## 2020-12-01 DIAGNOSIS — F840 Autistic disorder: Secondary | ICD-10-CM | POA: Diagnosis not present

## 2020-12-01 DIAGNOSIS — Z4822 Encounter for aftercare following kidney transplant: Secondary | ICD-10-CM | POA: Diagnosis not present

## 2020-12-01 DIAGNOSIS — F419 Anxiety disorder, unspecified: Secondary | ICD-10-CM | POA: Diagnosis not present

## 2020-12-01 DIAGNOSIS — Z7952 Long term (current) use of systemic steroids: Secondary | ICD-10-CM | POA: Diagnosis not present

## 2020-12-01 DIAGNOSIS — Z940 Kidney transplant status: Secondary | ICD-10-CM | POA: Diagnosis not present

## 2020-12-01 DIAGNOSIS — Z792 Long term (current) use of antibiotics: Secondary | ICD-10-CM | POA: Diagnosis not present

## 2020-12-01 DIAGNOSIS — Z79899 Other long term (current) drug therapy: Secondary | ICD-10-CM | POA: Diagnosis not present

## 2020-12-01 DIAGNOSIS — N281 Cyst of kidney, acquired: Secondary | ICD-10-CM | POA: Diagnosis not present

## 2020-12-01 DIAGNOSIS — E039 Hypothyroidism, unspecified: Secondary | ICD-10-CM | POA: Diagnosis not present

## 2020-12-01 DIAGNOSIS — D649 Anemia, unspecified: Secondary | ICD-10-CM | POA: Diagnosis not present

## 2020-12-01 DIAGNOSIS — F39 Unspecified mood [affective] disorder: Secondary | ICD-10-CM | POA: Diagnosis not present

## 2020-12-05 ENCOUNTER — Ambulatory Visit (INDEPENDENT_AMBULATORY_CARE_PROVIDER_SITE_OTHER): Payer: Medicare Other | Admitting: Gastroenterology

## 2020-12-05 ENCOUNTER — Encounter: Payer: Self-pay | Admitting: Gastroenterology

## 2020-12-05 VITALS — BP 130/72 | HR 88 | Ht 64.0 in | Wt 192.0 lb

## 2020-12-05 DIAGNOSIS — R1012 Left upper quadrant pain: Secondary | ICD-10-CM | POA: Diagnosis not present

## 2020-12-05 DIAGNOSIS — M41119 Juvenile idiopathic scoliosis, site unspecified: Secondary | ICD-10-CM

## 2020-12-05 DIAGNOSIS — R0781 Pleurodynia: Secondary | ICD-10-CM

## 2020-12-05 NOTE — Progress Notes (Signed)
HPI :  35 year old female here for a follow up visit for upper abdominal pain.   See prior notes for full details of her case, she was last seen by Nicoletta Ba in June.  She has a history of upper abdominal pain with some radiation to the chest at times.  She underwent an upper endoscopy with me in April showing a small hiatal hernia, no esophagitis.  Biopsies of her stomach and duodenum were unremarkable.  She has had a trial of 40 mg twice daily of PPI which did not provide any benefit to her symptoms.  Trial of Carafate was not helpful either.  She denies any heartburn symptoms at this time or regurgitation of food.  Her pain localizes to her left upper quadrant over the costal margin with slight radiation to the epigastric area.  She does not have any significant reliable pain related to eating.  She is eating okay, no nausea or vomiting.  No early satiety.  She does have some pain in her lower to mid back.  No other lower abdominal pain.  She denies any blood in her stools or diarrhea.  Bowel movements do not help relieve her pain.  She denies much change in her pain with movements but sometimes stretching may questionably help it.  She has since decreased her omeprazole to 20 mg twice daily and her reflux remains controlled.  She states over time now symptoms have been ongoing for years without any significant change.  There is no family history of colon cancer in the family.  She does have a history of anemia, EGD without clear cause, she has had heavy menses in the past thought to be the cause for this in combination with her renal disease.  She had a CT scan of the chest abdomen pelvis on June 21 to further evaluate the symptoms and no intra-abdominal pathology was appreciated.  She does have scoliosis of her spine and was seen by Dr. Ernestina Patches of [facility].  She has been referred to physical therapy for this issue.  CT C/A/P 10/14/20 - IMPRESSION: 1. No acute findings in the chest or abdomen. 2.  Mild cardiomegaly without pleural effusion or pulmonary edema. 3. Partially visualized right lower quadrant transplant kidney. No hydronephrosis. 4. S-shaped scoliotic curvature of the thoracolumbar spine.      Past Medical History:  Diagnosis Date   Anemia    Iron Def   Anxiety    Autism spectrum    Behavioral problems    Depression    End stage renal disease ([place])    GERD (gastroesophageal reflux disease)    Hyperlipidemia    Hypertension    HYPOTHYROIDISM 03/12/2009   Qualifier: Diagnosis of  By: Ta MD, Cat     MENTAL RETARDATION 06/23/2006   Qualifier: Diagnosis of  By: Eusebio Friendly     Mood disorder [facility])    Sees Dr Silvio Pate, who prescribes meds   Shortness of breath dyspnea    with exertion     Past Surgical History:  Procedure Laterality Date   AV FISTULA PLACEMENT Left 03/17/2015   Procedure: CREATION OF LEFT BRACHIOCEPHALIC ARTERIOVENOUS (AV) FISTULA ;  Surgeon: Conrad [place], MD;  Location: [place];  Service: Vascular;  Laterality: Left;   CHOLECYSTECTOMY  11/24/2004   INSERTION OF DIALYSIS CATHETER Right 03/17/2015   Procedure: INSERTION OF DIALYSIS CATHETER RIGHT INTERNAL JUIGULAR PLACEMENT;  Surgeon: Conrad [place], MD;  Location: [place];  Service: Vascular;  Laterality: Right;   KIDNEY TRANSPLANT  Right 11/2019   Family History  Problem Relation Age of Onset   Hypertension Mother    Diabetes Maternal Grandmother    Hypertension Maternal Grandmother    Heart disease Maternal Grandmother    Bladder Cancer Maternal Grandmother    Diabetes Maternal Grandfather    Cancer Maternal Grandfather        prostate   Colon cancer Neg Hx    Pancreatic cancer Neg Hx    Esophageal cancer Neg Hx    Liver disease Neg Hx    Stomach cancer Neg Hx    Rectal cancer Neg Hx    Social History   Tobacco Use   Smoking status: Never   Smokeless tobacco: Never  Vaping Use   Vaping Use: Never used  Substance Use Topics   Alcohol use: No    Alcohol/week: 0.0 standard drinks    Drug use: No   Current Outpatient Medications  Medication Sig Dispense Refill   amLODipine (NORVASC) 10 MG tablet Take 1 tablet by mouth daily.     aripiprazole (ABILIFY) 10 MG disintegrating tablet Take 10 mg by mouth at bedtime.     aspirin EC 81 MG tablet Take 81 mg by mouth daily. Swallow whole.     busPIRone (BUSPAR) 10 MG tablet Take 10 mg by mouth 3 (three) times daily.     ferrous sulfate 325 (65 FE) MG tablet Take 1 tablet (325 mg total) by mouth 2 (two) times daily. 180 tablet 3   hydrOXYzine (ATARAX/VISTARIL) 25 MG tablet Take 25 mg by mouth 3 (three) times daily as needed.     lamoTRIgine (LAMICTAL) 200 MG tablet Take 100 mg by mouth every morning. Takes 1 tablet at night and 1/2 tablet in the AM     levothyroxine (SYNTHROID) 112 MCG tablet TAKE 1 TABLET BY MOUTH EVERY DAY BEFORE BREAKFAST 30 tablet 1   metoprolol succinate (TOPROL-XL) 25 MG 24 hr tablet Take 1 tablet by mouth daily.     mycophenolate (MYFORTIC) 180 MG EC tablet Take 180 mg by mouth See admin instructions. Take 3 tablets in the morning and 3 tablets in the evening     omeprazole (PRILOSEC) 20 MG capsule Take 1 capsule (20 mg total) by mouth 2 (two) times daily before a meal. 60 capsule 6   polyethylene glycol powder (GLYCOLAX/MIRALAX) 17 GM/SCOOP powder Take 17 g by mouth daily as needed for mild constipation. Reported on 05/26/2015 255 g 3   predniSONE (DELTASONE) 5 MG tablet Take 5 mg by mouth daily.     sennosides-docusate sodium (SENOKOT-S) 8.6-50 MG tablet Take 1 tablet by mouth in the morning and at bedtime.     sertraline (ZOLOFT) 100 MG tablet Take 100 mg by mouth daily.      sucralfate (CARAFATE) 1 GM/10ML suspension Take 10 mLs (1 g total) by mouth at bedtime. 30 mL 11   Tacrolimus ER 4 MG TB24 Take 8 mg by mouth daily.     No current facility-administered medications for this visit.   No Known Allergies   Review of Systems: All systems reviewed and negative except where noted in HPI.    XR  SCOLIOSIS EVAL COMPLETE SPINE 1 VIEW  Result Date: 11/28/2020 Significant thoracolumbar scoliosis left convex in the lumbar spine with some rotatory component.  Cobb angle in the thoracic region measured today approximately 29.4 and Cobb angle in the lumbar area measured today at approximately 37.5.  Compared to lumbar spine x-ray dated 2018 with lower curve Cobb angle measured  at approximately 31.5.  Compared to chest x-ray dated 2014 with Cobb angle approximately 27.8.   Physical Exam: BP 130/72 (BP Location: Right Arm, Patient Position: Sitting, Cuff Size: Normal)   Pulse 88   Ht '5\' 4"'$  (1.626 m) Comment: height measured without shoes  Wt 192 lb (87.1 kg)   LMP 11/25/2020   BMI 32.96 kg/m  Constitutional: Pleasant,well-developed, female in no acute distress. Abdominal: Soft, nondistended, some tenderness to palpation along costal margin. There are no masses palpable.  Extremities: no edema Lymphadenopathy: No cervical adenopathy noted. Neurological: Alert and oriented to person place and time. Skin: Skin is warm and dry. No rashes noted. Psychiatric: Normal mood and affect. Behavior is normal.   ASSESSMENT AND PLAN: 35 year old female here for reassessment of the following:  Left upper quadrant pain Rib pain Scoliosis  Reviewed history with the patient and her mother today.  She has had CT scan and endoscopy without clear cause for her pain.  High-dose PPI has provided no benefit.  Her symptoms really sound mostly musculoskeletal/neuropathic to me.  She does have severe scoliosis on imaging, seen by PM&R.  They have recommended physical therapy to see if this helps which I agree with.  She can try some Voltaren gel or other topical therapies such as lidocaine patches as well in the interim to see if it helps.  I do not think her GI tract is causing her pain at this point time.  She will continue to follow-up with PM&R for this issue and can come back to see [place] as needed if something  changes in the interim.  All questions answered, the patient and her mother agree with the plan.  Jolly Mango, MD [facility]

## 2020-12-05 NOTE — Patient Instructions (Addendum)
If you are age 35 or older, your body mass index should be between 23-30. Your Body mass index is 32.96 kg/m. If this is out of the aforementioned range listed, please consider follow up with your Primary Care Provider.  If you are age 30 or younger, your body mass index should be between 19-25. Your Body mass index is 32.96 kg/m. If this is out of the aformentioned range listed, please consider follow up with your Primary Care Provider.   __________________________________________________________  The [facility] providers would like to encourage you to use [facility] to communicate with providers for non-urgent requests or questions.  Due to long hold times on the telephone, sending your provider a message by [facility] may be a faster and more efficient way to get a response.  Please allow 48 business hours for a response.  Please remember that this is for non-urgent requests.    Please try over-the-counter Voltaren gel or Lidocaine patches for your pain.  Please follow up as needed.  Thank you for entrusting me with your care and for choosing [facility], Dr. Mondamin Cellar

## 2020-12-09 ENCOUNTER — Ambulatory Visit: Payer: Medicare Other | Admitting: Physical Therapy

## 2020-12-11 DIAGNOSIS — F319 Bipolar disorder, unspecified: Secondary | ICD-10-CM | POA: Diagnosis not present

## 2020-12-11 DIAGNOSIS — F329 Major depressive disorder, single episode, unspecified: Secondary | ICD-10-CM | POA: Diagnosis not present

## 2020-12-11 DIAGNOSIS — F71 Moderate intellectual disabilities: Secondary | ICD-10-CM | POA: Diagnosis not present

## 2020-12-11 DIAGNOSIS — F422 Mixed obsessional thoughts and acts: Secondary | ICD-10-CM | POA: Diagnosis not present

## 2020-12-12 DIAGNOSIS — F422 Mixed obsessional thoughts and acts: Secondary | ICD-10-CM | POA: Diagnosis not present

## 2020-12-12 DIAGNOSIS — F319 Bipolar disorder, unspecified: Secondary | ICD-10-CM | POA: Diagnosis not present

## 2020-12-12 DIAGNOSIS — F329 Major depressive disorder, single episode, unspecified: Secondary | ICD-10-CM | POA: Diagnosis not present

## 2020-12-12 DIAGNOSIS — F71 Moderate intellectual disabilities: Secondary | ICD-10-CM | POA: Diagnosis not present

## 2020-12-18 ENCOUNTER — Other Ambulatory Visit: Payer: Self-pay | Admitting: Family Medicine

## 2020-12-18 DIAGNOSIS — E039 Hypothyroidism, unspecified: Secondary | ICD-10-CM

## 2020-12-30 DIAGNOSIS — Z940 Kidney transplant status: Secondary | ICD-10-CM | POA: Diagnosis not present

## 2020-12-30 DIAGNOSIS — K219 Gastro-esophageal reflux disease without esophagitis: Secondary | ICD-10-CM | POA: Diagnosis not present

## 2020-12-30 DIAGNOSIS — I10 Essential (primary) hypertension: Secondary | ICD-10-CM | POA: Diagnosis not present

## 2020-12-30 DIAGNOSIS — Z4822 Encounter for aftercare following kidney transplant: Secondary | ICD-10-CM | POA: Diagnosis not present

## 2020-12-30 DIAGNOSIS — R079 Chest pain, unspecified: Secondary | ICD-10-CM | POA: Diagnosis not present

## 2020-12-30 DIAGNOSIS — N281 Cyst of kidney, acquired: Secondary | ICD-10-CM | POA: Diagnosis not present

## 2020-12-30 DIAGNOSIS — Z79899 Other long term (current) drug therapy: Secondary | ICD-10-CM | POA: Diagnosis not present

## 2020-12-30 DIAGNOSIS — Z7952 Long term (current) use of systemic steroids: Secondary | ICD-10-CM | POA: Diagnosis not present

## 2020-12-30 DIAGNOSIS — D849 Immunodeficiency, unspecified: Secondary | ICD-10-CM | POA: Diagnosis not present

## 2021-01-01 ENCOUNTER — Ambulatory Visit: Payer: Medicare Other | Attending: Physical Medicine and Rehabilitation | Admitting: Physical Therapy

## 2021-01-01 ENCOUNTER — Other Ambulatory Visit: Payer: Self-pay

## 2021-01-01 DIAGNOSIS — M41115 Juvenile idiopathic scoliosis, thoracolumbar region: Secondary | ICD-10-CM | POA: Diagnosis not present

## 2021-01-01 DIAGNOSIS — M546 Pain in thoracic spine: Secondary | ICD-10-CM | POA: Insufficient documentation

## 2021-01-01 DIAGNOSIS — R252 Cramp and spasm: Secondary | ICD-10-CM | POA: Diagnosis not present

## 2021-01-01 DIAGNOSIS — M6281 Muscle weakness (generalized): Secondary | ICD-10-CM | POA: Insufficient documentation

## 2021-01-01 NOTE — Therapy (Addendum)
[place], [place], [address] Phone: [phone]   Fax:  [phone]  Physical Therapy Evaluation/Discharge Note  Patient Details  Name: Vanessa Alvarez MRN: [id number] Date of Birth: [birth year] Referring Provider (PT): Magnus Sinning MD   Encounter Date: 01/01/2021   PT End of Session - 01/01/21 0935     Visit Number 1    Number of Visits 13    Date for PT Re-Evaluation 02/12/21    Authorization Type MCD  No TX , e stim, ionto or VASO    PT Start Time 0755    PT Stop Time 0845    PT Time Calculation (min) 50 min    Activity Tolerance No increased pain;Other (comment)   slight anxiety with new positions prone/sidelying            Past Medical History:  Diagnosis Date   Anemia    Iron Def   Anxiety    Autism spectrum    Behavioral problems    Depression    End stage renal disease ([place])    GERD (gastroesophageal reflux disease)    Hyperlipidemia    Hypertension    HYPOTHYROIDISM 03/12/2009   Qualifier: Diagnosis of  By: Ta MD, Cat     MENTAL RETARDATION 06/23/2006   Qualifier: Diagnosis of  By: Eusebio Friendly     Mood disorder [facility])    Sees Dr Silvio Pate, who prescribes meds   Shortness of breath dyspnea    with exertion    Past Surgical History:  Procedure Laterality Date   AV FISTULA PLACEMENT Left 03/17/2015   Procedure: CREATION OF LEFT BRACHIOCEPHALIC ARTERIOVENOUS (AV) FISTULA ;  Surgeon: Conrad [place], MD;  Location: [place];  Service: Vascular;  Laterality: Left;   CHOLECYSTECTOMY  11/24/2004   INSERTION OF DIALYSIS CATHETER Right 03/17/2015   Procedure: INSERTION OF DIALYSIS CATHETER RIGHT INTERNAL JUIGULAR PLACEMENT;  Surgeon: Conrad [place], MD;  Location: [place];  Service: Vascular;  Laterality: Right;   KIDNEY TRANSPLANT Right 11/2019    There were no vitals filed for this visit.    Subjective Assessment - 01/01/21 0801     Subjective [name] explains where she hurts in CC in  Left front /side rib pain and secondarily thoracolumbar pain.  Mom/dtr says  pain is on chest ( pectoral pull) and Left rib pain under breast. Mother describes pain over left rib ( flaring) and sometimes the Right rib pain.  Mom reports pain + 3 years ( 2019) Mom states she does not routinely exercise    Patient is accompained by: Family member   mom Vanessa Alvarez   Pertinent History Kidney transplant 11-29-19, with old fistula scars, on Left arm, MR with Mild autism, bipolar, R anterior rib pain/ thoracolumbar pain,    How long can you sit comfortably? unlimited    How long can you stand comfortably? varies  pain 1 to 10    How long can you walk comfortably? doesnt walk that much    Diagnostic tests xray, GI, cardiac and ortho all normal just scoliosis    Patient Stated Goals decrease pain, get stronger    Currently in Pain? Yes    Pain Score 1    worst 10/10   Pain Location Rib cage    Pain Orientation Left    Pain Descriptors / Indicators Tightness    Pain Type Chronic pain    Pain Onset More than a month ago    Pain Frequency Intermittent  Aggravating Factors  sometimes wakes up at night with pain,    Multiple Pain Sites Yes    Pain Score 0   at worst 10/10   Pain Location Back   thoraco lumbar   Pain Orientation Mid    Pain Descriptors / Indicators Tightness    Pain Type Chronic pain    Pain Onset More than a month ago    Pain Frequency Intermittent                [facility] PT Assessment - 01/01/21 0001       Assessment   Medical Diagnosis juvenile idiopathis scoliosis, back pain    Referring Provider (PT) Magnus Sinning MD    Onset Date/Surgical Date --   states 3 year history of thoracolumbar/ rib pain 2019   Hand Dominance Right    Prior Therapy none      Precautions   Precaution Comments status post kidney transplant l LUE fistula      Restrictions   Weight Bearing Restrictions No    Other Position/Activity Restrictions L UE   fistula L UE     Balance Screen   Has  the patient fallen in the past 6 months No    Has the patient had a decrease in activity level because of a fear of falling?  No    Is the patient reluctant to leave their home because of a fear of falling?  No      Home Ecologist residence    [place] to enter    Entrance Stairs-Number of Steps 3    Entrance Stairs-Rails None    Home Layout One level      Prior Function   Level of Independence Other (comment)    Vocation Other (comment)    Vocation Requirements Pt is MR with autism and lives with mom/ dependent      Cognition   Overall Cognitive Status History of cognitive impairments - at baseline      Observation/Other Assessments   Focus on Therapeutic Outcomes (FOTO)  MCD/ MCR no foto      Functional Tests   Functional tests Sit to Stand      Sit to Stand   Comments 5 x STS 9.58 sec      Posture/Postural Control   Posture/Postural Control Postural limitations    Postural Limitations Rounded Shoulders;Forward head    Posture Comments scoliosis s curve Convex thorax R, convex left lumbar   Cobb angle 35     ROM / Strength   AROM / PROM / Strength AROM;Strength      AROM   Overall AROM Comments shoulders WFL    Lumbar Flexion limited 25% can touch ankles bil    Lumbar Extension limted 50%    Lumbar - Right Side Bend limited 25 %    Lumbar - Left Side Bend limited 50%    Lumbar - Right Rotation liimited 50%    Lumbar - Left Rotation limited 50%      Strength   Overall Strength Deficits    Overall Strength Comments Pt unable to test in prone and cannot tolerate position.  limited some in sidelying for strength but can tolerate stretching,  gorssly 3+/5 to 4-/5 on hip ext/ hip abd  4-/5      Flexibility   Hamstrings bil tight hamstrings      Palpation   Spinal mobility hypomobility in T 1 to  T8, L-1 to L5  but no pain on eval lumbar slight pain left T-4 t T8 with palpation    Palpation comment  TTP over  left lower costochondral ribs and over bil pectoral muscle.  L and R lower rib tendernsess L > R                        Objective measurements completed on examination: See above findings.                PT Education - 01/01/21 0840     Education Details POC, Explanation of findings and iniitial HEP  walking program    Person(s) Educated Patient;Parent(s)    Methods Explanation;Demonstration;Tactile cues;Verbal cues;Handout    Comprehension Verbalized understanding;Returned demonstration;Need further instruction              PT Short Term Goals - 01/01/21 0842       PT SHORT TERM GOAL #1   Title Pt will be min A with initial HEP with mother instructing    Baseline no exercise routinely , no knowledge    Time 2    Period Weeks    Status New    Target Date 01/22/21      PT SHORT TERM GOAL #2   Title Pt will tolerate posterior chain strengthening    Baseline unable to tolerate prone  or sidelying execises    Time 3    Period Weeks    Status New    Target Date 01/22/21      PT SHORT TERM GOAL #3   Title Pt /mother will initiate daily walking movement exercises    Baseline Pt/mother  sedentary    Time 3    Period Weeks    Status New    Target Date 01/22/21               PT Long Term Goals - 01/01/21 0842       PT LONG TERM GOAL #1   Title Pt will be min A with HEP with help of mom    Baseline no knowledge    Time 6    Period Weeks    Status New    Target Date 02/12/21      PT LONG TERM GOAL #2   Title Pt will tolerate prone exercise without exacerbating    Baseline Pt 7/10 in prone on left rib in prone on eval    Time 6    Period Weeks    Status New    Target Date 02/12/21      PT LONG TERM GOAL #3   Title Mother and daughter will incorporate walking program at least 3 x a week for better health habits/benefits    Baseline no exercise at eval    Time 6    Period Weeks    Status New    Target Date 02/12/21       PT LONG TERM GOAL #4   Title Mom/pt will be able to incorporate pain management techniques to manage pain  exacerbations    Baseline no knowledge    Time 6    Period Weeks    Status New    Target Date 02/12/21      PT LONG TERM GOAL #5   Title Pt will b eable exercise with weights and resistance without pain exacerbation greatere than 2/10    Baseline Pt does not tolerate resistance without pain on eval  Time 6    Period Weeks    Status New    Target Date 02/12/21                    Plan - 01/01/21 6767     Clinical Impression Statement 35 yo female with MR and autism but able to communicate pain and demo exercises accompanied by mother for thoracolumbar pain (0/10 today but up to 10/10) and Left rib and anterior chest pain ( pectoral).  Pt has s curve thoracolumbar S curve ( Cobb angle 35 degrees). Left shld depressed with thoracic convex R.  Pt and mother do not do routine exercise.  Educated on walking program.  Pt with posterior chain weakness and could not tolerate prone or sidelying exercises for strengtheing so given stretching exercise that pt was able to return demo.  Pt has history of 1 year kidney transplant 11-28-20 with L UE fistula scars. Pt generalling deconditioned and would benefit from exercise and good HEP for back strengthening.  Will benefit form skilled PT for 2 x a week for 6 weeks with mother accompanying at clinic Rx for carryover at home.    Personal Factors and Comorbidities Comorbidity 1;Comorbidity 2    Comorbidities Kidney transplant 11-29-19, with old fistula scars, on Left arm, MR with Mild autism, bipolar, R anterior rib pain/ thoracolumbar pain,BMI    Examination-Activity Limitations Sleep;Lift    Examination-Participation Restrictions Other   routine exercise   Stability/Clinical Decision Making Evolving/Moderate complexity    Clinical Decision Making Moderate    Rehab Potential Good    PT Frequency 2x / week    PT Duration 6 weeks    PT  Treatment/Interventions Moist Heat;Cryotherapy;Therapeutic activities;Therapeutic exercise;Balance training;Neuromuscular re-education;Patient/family education;Dry needling;Manual techniques;Taping;Joint Manipulations;Spinal Manipulations    PT Next Visit Plan review stretching . check compliance with daily movement walking.  begin posterior chain strength.  Pt has difficulty anxiety with prone and sidelying exer ( flexed postureand scolioses)    PT Home Exercise Plan [email or web address]    Consulted and Agree with Plan of Care Patient;Family member/caregiver   mother Vanessa Alvarez            Patient will benefit from skilled therapeutic intervention in order to improve the following deficits and impairments:     Visit Diagnosis: Pain in thoracic spine - Plan: PT plan of care cert/re-cert  Muscle weakness (generalized) - Plan: PT plan of care cert/re-cert  Juvenile idiopathic scoliosis of thoracolumbar region - Plan: PT plan of care cert/re-cert  Cramp and spasm - Plan: PT plan of care cert/re-cert  Access Code: [email or web address]: [email or web address]: 09/08/2022Prepared by: Lesly Rubenstein Notes Vanessa Alvarez sit with knees squeezing towel roll and use praying hands and turn to right and hold 5-10 sec and then turn to Left and hold 5-10 seconds Exercises  Corner Pec Major Stretch - 1 x daily - 7 x weekly - 1 sets - 3 reps  Sidelying Open Book Thoracic Rotation with Knee on Foam Roll - 1 x daily - 7 x weekly - 1 sets - 10 reps - 3-5 hold  Cat Cow to Child's Pose - 1 x daily - 7 x weekly - 2 sets - 10 reps   Problem List Patient Active Problem List   Diagnosis Date Noted   Sore throat 04/30/2020   Right hip pain 02/04/2020   Left-sided chest pain 08/23/2019   Abnormal glucose 06/15/2019   Chronic idiopathic constipation 06/15/2019   Intellectual disability 02/01/2018   Neck pain 05/04/2017   GERD (  gastroesophageal reflux disease) 05/04/2017   Paranoia ([place]) 10/16/2015    Hyperphosphatemia 07/11/2015   Affective disorder ([place]) 07/06/2015   Coagulation defect, unspecified ([place]) 03/18/2015   Generalized anxiety disorder 03/18/2015   Rectal bleeding 03/04/2015   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 [place], [place], [address] Phone: [phone]   Fax:  [phone]  Name: Vanessa Alvarez MRN: [id number] Date of Birth: 1986-04-11  Check all possible CPT codes: 97110- Therapeutic Exercise, [phone]- Neuro Re-education, [phone] - Gait Training, [phone] - Manual Therapy, [phone] - Therapeutic Activities, [phone] - Self Care, and [phone] - Physical performance training       PHYSICAL THERAPY DISCHARGE SUMMARY  Visits from Start of Care: 1  Current functional level related to goals / functional outcomes: unknown   Remaining deficits: Last know above   Education / Equipment: Iniitial HEP   Patient agrees to discharge. Patient goals were not met. Patient is being discharged duenot want to come to PT after arriving  to clinic and has never returned.  And also due to a change in medical status.  Pt had excessive cancellations and no show and mother left message that pt needs to cancel all appt dur to medical reasons.      Voncille Lo, PT, [place] Certified Exercise Expert for the Aging Adult  01/29/21 11:32 AM Phone: [phone] Fax: [phone]

## 2021-01-01 NOTE — Patient Instructions (Signed)
WALKING  Walking is a great form of exercise to increase your strength, endurance and overall fitness.  A walking program can help you start slowly and gradually build endurance as you go.  Everyone's ability is different, so each person's starting point will be different.  You do not have to follow them exactly.  The are just samples. You should simply find out what's right for you and stick to that program.   In the beginning, you'll start off walking 2-3 times a day for short distances.  As you get stronger, you'll be walking further at just 1-2 times per day.  Walk 7000 to 10000 steps a day. Use a pedometer.   Minimum exercise a week is 150 min   ( 30 min a day 5 x a week) orto 300 minutus a week  up to an 1 hour  5 days a week  A. You Can Walk For A Certain Length Of Time Each Day    Walk 5 minutes 3 times per day.  Increase 2 minutes every 2 days (3 times per day).  Work up to 25-30 minutes (1-2 times per day).   Example:   Day 1-2 5 minutes 3 times per day   Day 7-8 12 minutes 2-3 times per day   Day 13-14 25 minutes 1-2 times per day  B. You Can Walk For a Certain Distance Each Day     Distance can be substituted for time.    Example:   3 trips to mailbox (at road)   3 trips to corner of block   3 trips around the block  C. Go to local high school and use the track.    Walk for distance ____ around track  Or time ____ minutes  D. Walk __x__ Jog ____ Run ___  Please only do the exercises that your therapist has initialed and dated     Voncille Lo, PT, [facility] Certified Exercise Expert for the Aging Adult  01/01/21 8:40 AM Phone: [phone] Fax: [phone]

## 2021-01-02 DIAGNOSIS — F329 Major depressive disorder, single episode, unspecified: Secondary | ICD-10-CM | POA: Diagnosis not present

## 2021-01-02 DIAGNOSIS — F319 Bipolar disorder, unspecified: Secondary | ICD-10-CM | POA: Diagnosis not present

## 2021-01-02 DIAGNOSIS — F71 Moderate intellectual disabilities: Secondary | ICD-10-CM | POA: Diagnosis not present

## 2021-01-02 DIAGNOSIS — F422 Mixed obsessional thoughts and acts: Secondary | ICD-10-CM | POA: Diagnosis not present

## 2021-01-07 DIAGNOSIS — Z79899 Other long term (current) drug therapy: Secondary | ICD-10-CM | POA: Diagnosis not present

## 2021-01-07 DIAGNOSIS — Z940 Kidney transplant status: Secondary | ICD-10-CM | POA: Diagnosis not present

## 2021-01-07 DIAGNOSIS — Z7952 Long term (current) use of systemic steroids: Secondary | ICD-10-CM | POA: Diagnosis not present

## 2021-01-07 DIAGNOSIS — Z792 Long term (current) use of antibiotics: Secondary | ICD-10-CM | POA: Diagnosis not present

## 2021-01-07 DIAGNOSIS — Z4822 Encounter for aftercare following kidney transplant: Secondary | ICD-10-CM | POA: Diagnosis not present

## 2021-01-07 DIAGNOSIS — D649 Anemia, unspecified: Secondary | ICD-10-CM | POA: Diagnosis not present

## 2021-01-07 DIAGNOSIS — I10 Essential (primary) hypertension: Secondary | ICD-10-CM | POA: Diagnosis not present

## 2021-01-07 DIAGNOSIS — N281 Cyst of kidney, acquired: Secondary | ICD-10-CM | POA: Diagnosis not present

## 2021-01-07 DIAGNOSIS — R600 Localized edema: Secondary | ICD-10-CM | POA: Diagnosis not present

## 2021-01-07 DIAGNOSIS — D849 Immunodeficiency, unspecified: Secondary | ICD-10-CM | POA: Diagnosis not present

## 2021-01-07 DIAGNOSIS — K219 Gastro-esophageal reflux disease without esophagitis: Secondary | ICD-10-CM | POA: Diagnosis not present

## 2021-01-12 ENCOUNTER — Ambulatory Visit: Payer: Medicare Other | Admitting: Physical Therapy

## 2021-01-15 ENCOUNTER — Other Ambulatory Visit: Payer: Self-pay

## 2021-01-15 ENCOUNTER — Ambulatory Visit: Payer: Medicare Other

## 2021-01-20 ENCOUNTER — Ambulatory Visit: Payer: Medicare Other | Admitting: Physical Therapy

## 2021-01-21 ENCOUNTER — Telehealth: Payer: Self-pay | Admitting: Physical Therapy

## 2021-01-21 NOTE — Telephone Encounter (Signed)
LVM for mother about attendance policy and excessive cancellation.   Asked mother to return call about whether PT is beneficial right now for daughter since she has not wanted to participate.  Left message to return call to discuss situation with mother due to dtr special needs.  Asked pt/mother to call PT at clinic phone [place], [place], [facility] Certified Exercise Expert for the Aging Adult  01/21/21 1:23 PM Phone: [phone] Fax: [phone]

## 2021-01-22 ENCOUNTER — Ambulatory Visit: Payer: Medicare Other

## 2021-01-23 DIAGNOSIS — F422 Mixed obsessional thoughts and acts: Secondary | ICD-10-CM | POA: Diagnosis not present

## 2021-01-23 DIAGNOSIS — F319 Bipolar disorder, unspecified: Secondary | ICD-10-CM | POA: Diagnosis not present

## 2021-01-23 DIAGNOSIS — F329 Major depressive disorder, single episode, unspecified: Secondary | ICD-10-CM | POA: Diagnosis not present

## 2021-01-23 DIAGNOSIS — F71 Moderate intellectual disabilities: Secondary | ICD-10-CM | POA: Diagnosis not present

## 2021-01-26 DIAGNOSIS — Z792 Long term (current) use of antibiotics: Secondary | ICD-10-CM | POA: Diagnosis not present

## 2021-01-26 DIAGNOSIS — D649 Anemia, unspecified: Secondary | ICD-10-CM | POA: Diagnosis not present

## 2021-01-26 DIAGNOSIS — K219 Gastro-esophageal reflux disease without esophagitis: Secondary | ICD-10-CM | POA: Diagnosis not present

## 2021-01-26 DIAGNOSIS — Z940 Kidney transplant status: Secondary | ICD-10-CM | POA: Diagnosis not present

## 2021-01-26 DIAGNOSIS — Z79899 Other long term (current) drug therapy: Secondary | ICD-10-CM | POA: Diagnosis not present

## 2021-01-26 DIAGNOSIS — Z7952 Long term (current) use of systemic steroids: Secondary | ICD-10-CM | POA: Diagnosis not present

## 2021-01-26 DIAGNOSIS — Z4822 Encounter for aftercare following kidney transplant: Secondary | ICD-10-CM | POA: Diagnosis not present

## 2021-01-26 DIAGNOSIS — Z79621 Long term (current) use of calcineurin inhibitor: Secondary | ICD-10-CM | POA: Diagnosis not present

## 2021-01-26 DIAGNOSIS — I10 Essential (primary) hypertension: Secondary | ICD-10-CM | POA: Diagnosis not present

## 2021-01-26 DIAGNOSIS — Z5181 Encounter for therapeutic drug level monitoring: Secondary | ICD-10-CM | POA: Diagnosis not present

## 2021-01-26 DIAGNOSIS — D849 Immunodeficiency, unspecified: Secondary | ICD-10-CM | POA: Diagnosis not present

## 2021-01-27 ENCOUNTER — Ambulatory Visit: Payer: Medicare Other | Admitting: Physical Therapy

## 2021-01-28 ENCOUNTER — Ambulatory Visit: Payer: Medicare Other | Admitting: Specialist

## 2021-01-29 ENCOUNTER — Ambulatory Visit: Payer: Medicare Other | Admitting: Physical Therapy

## 2021-02-03 ENCOUNTER — Ambulatory Visit: Payer: Medicare Other | Admitting: Physical Therapy

## 2021-02-10 ENCOUNTER — Encounter: Payer: Medicare Other | Admitting: Physical Therapy

## 2021-02-11 ENCOUNTER — Ambulatory Visit: Payer: Medicare Other | Admitting: Specialist

## 2021-02-11 ENCOUNTER — Other Ambulatory Visit: Payer: Self-pay

## 2021-02-15 ENCOUNTER — Other Ambulatory Visit: Payer: Self-pay | Admitting: Family Medicine

## 2021-02-15 DIAGNOSIS — E039 Hypothyroidism, unspecified: Secondary | ICD-10-CM

## 2021-02-19 ENCOUNTER — Encounter: Payer: Medicare Other | Admitting: Physical Therapy

## 2021-02-23 DIAGNOSIS — I120 Hypertensive chronic kidney disease with stage 5 chronic kidney disease or end stage renal disease: Secondary | ICD-10-CM | POA: Diagnosis not present

## 2021-02-23 DIAGNOSIS — T8619 Other complication of kidney transplant: Secondary | ICD-10-CM | POA: Diagnosis not present

## 2021-02-23 DIAGNOSIS — Z7952 Long term (current) use of systemic steroids: Secondary | ICD-10-CM | POA: Diagnosis not present

## 2021-02-23 DIAGNOSIS — Z79899 Other long term (current) drug therapy: Secondary | ICD-10-CM | POA: Diagnosis not present

## 2021-02-23 DIAGNOSIS — Z23 Encounter for immunization: Secondary | ICD-10-CM | POA: Diagnosis not present

## 2021-02-23 DIAGNOSIS — D849 Immunodeficiency, unspecified: Secondary | ICD-10-CM | POA: Diagnosis not present

## 2021-02-23 DIAGNOSIS — I10 Essential (primary) hypertension: Secondary | ICD-10-CM | POA: Diagnosis not present

## 2021-02-23 DIAGNOSIS — N186 End stage renal disease: Secondary | ICD-10-CM | POA: Diagnosis not present

## 2021-02-23 DIAGNOSIS — D708 Other neutropenia: Secondary | ICD-10-CM | POA: Diagnosis not present

## 2021-02-23 DIAGNOSIS — K219 Gastro-esophageal reflux disease without esophagitis: Secondary | ICD-10-CM | POA: Diagnosis not present

## 2021-02-23 DIAGNOSIS — Z4822 Encounter for aftercare following kidney transplant: Secondary | ICD-10-CM | POA: Diagnosis not present

## 2021-02-23 DIAGNOSIS — Z792 Long term (current) use of antibiotics: Secondary | ICD-10-CM | POA: Diagnosis not present

## 2021-02-23 DIAGNOSIS — Z940 Kidney transplant status: Secondary | ICD-10-CM | POA: Diagnosis not present

## 2021-02-23 DIAGNOSIS — N281 Cyst of kidney, acquired: Secondary | ICD-10-CM | POA: Diagnosis not present

## 2021-02-24 ENCOUNTER — Encounter: Payer: Medicare Other | Admitting: Physical Therapy

## 2021-02-26 ENCOUNTER — Encounter: Payer: Medicare Other | Admitting: Physical Therapy

## 2021-02-27 DIAGNOSIS — F319 Bipolar disorder, unspecified: Secondary | ICD-10-CM | POA: Diagnosis not present

## 2021-02-27 DIAGNOSIS — F329 Major depressive disorder, single episode, unspecified: Secondary | ICD-10-CM | POA: Diagnosis not present

## 2021-02-27 DIAGNOSIS — F71 Moderate intellectual disabilities: Secondary | ICD-10-CM | POA: Diagnosis not present

## 2021-02-27 DIAGNOSIS — F422 Mixed obsessional thoughts and acts: Secondary | ICD-10-CM | POA: Diagnosis not present

## 2021-03-04 DIAGNOSIS — R809 Proteinuria, unspecified: Secondary | ICD-10-CM | POA: Diagnosis not present

## 2021-03-04 DIAGNOSIS — Z940 Kidney transplant status: Secondary | ICD-10-CM | POA: Diagnosis not present

## 2021-03-04 DIAGNOSIS — E669 Obesity, unspecified: Secondary | ICD-10-CM | POA: Diagnosis not present

## 2021-03-04 DIAGNOSIS — N051 Unspecified nephritic syndrome with focal and segmental glomerular lesions: Secondary | ICD-10-CM | POA: Diagnosis not present

## 2021-03-04 DIAGNOSIS — Z79899 Other long term (current) drug therapy: Secondary | ICD-10-CM | POA: Diagnosis not present

## 2021-03-04 DIAGNOSIS — E039 Hypothyroidism, unspecified: Secondary | ICD-10-CM | POA: Diagnosis not present

## 2021-03-04 DIAGNOSIS — I129 Hypertensive chronic kidney disease with stage 1 through stage 4 chronic kidney disease, or unspecified chronic kidney disease: Secondary | ICD-10-CM | POA: Diagnosis not present

## 2021-03-05 ENCOUNTER — Ambulatory Visit: Payer: Medicare Other

## 2021-03-09 DIAGNOSIS — F71 Moderate intellectual disabilities: Secondary | ICD-10-CM | POA: Diagnosis not present

## 2021-03-09 DIAGNOSIS — F329 Major depressive disorder, single episode, unspecified: Secondary | ICD-10-CM | POA: Diagnosis not present

## 2021-03-09 DIAGNOSIS — F422 Mixed obsessional thoughts and acts: Secondary | ICD-10-CM | POA: Diagnosis not present

## 2021-03-09 DIAGNOSIS — F319 Bipolar disorder, unspecified: Secondary | ICD-10-CM | POA: Diagnosis not present

## 2021-03-12 DIAGNOSIS — F319 Bipolar disorder, unspecified: Secondary | ICD-10-CM | POA: Diagnosis not present

## 2021-03-12 DIAGNOSIS — F422 Mixed obsessional thoughts and acts: Secondary | ICD-10-CM | POA: Diagnosis not present

## 2021-03-18 ENCOUNTER — Encounter: Payer: Self-pay | Admitting: Specialist

## 2021-03-18 ENCOUNTER — Ambulatory Visit (INDEPENDENT_AMBULATORY_CARE_PROVIDER_SITE_OTHER): Payer: Medicare Other | Admitting: Specialist

## 2021-03-18 ENCOUNTER — Other Ambulatory Visit: Payer: Self-pay

## 2021-03-18 VITALS — BP 122/79 | HR 85 | Ht 64.0 in | Wt 198.0 lb

## 2021-03-18 DIAGNOSIS — M2170 Unequal limb length (acquired), unspecified site: Secondary | ICD-10-CM | POA: Diagnosis not present

## 2021-03-18 DIAGNOSIS — R0789 Other chest pain: Secondary | ICD-10-CM

## 2021-03-18 DIAGNOSIS — M419 Scoliosis, unspecified: Secondary | ICD-10-CM

## 2021-03-18 DIAGNOSIS — M4125 Other idiopathic scoliosis, thoracolumbar region: Secondary | ICD-10-CM | POA: Diagnosis not present

## 2021-03-18 DIAGNOSIS — R0781 Pleurodynia: Secondary | ICD-10-CM

## 2021-03-18 NOTE — Patient Instructions (Signed)
You have idiopathic scoliosis with a severe curve, greater than 40 degrees.  This is a condition that is usually more progressive during childhood.  The scoliosis is associated with rib deformity and the crowding of the ribs on the left side is likely the cause of the left sided chest pain but scoliosis when it is severe can cause cardiopulmonary Problems that can result in a shortening of one's life span. You have seen a cardiologist and your heart is  Normal.  We are referring you to a pulmonary specialist to assess your lung function. The rib pain will be treated with a TENS unit and physical therapy. If therapy is of no benefit then a referral to a tertiary scoliosis center may be necessary if surgery is considered As the degree of scoliosis is such than a anterior (front of spine) and posterior (back of spine) combined approach may be necessary allow with left rib surgery to treat the condition.  Your exam is also showing signs that your left leg is shorter than your right leg. A CT scan is ordered to assess the degree of difference in the legs length.  Avoid arthritis medications as these are contraindicated in patient's with kidney conditions.

## 2021-03-18 NOTE — Addendum Note (Signed)
Addended by: Minda Ditto, Geoffery Spruce on: 03/18/2021 02:18 PM   Modules accepted: Orders

## 2021-03-18 NOTE — Progress Notes (Signed)
Office Visit Note   Patient: Vanessa Alvarez           Date of Birth: [birth year]           MRN: [id number] Visit Date: 03/18/2021              Requested by: Vanessa Alvarez, Vanessa Alvarez,  Vanessa Alvarez PCP: Vanessa Button, MD   Assessment & Plan: Visit Diagnoses:  1. Other idiopathic scoliosis, thoracolumbar region   2. Rib pain on left side   3. Leg length discrepancy     Plan: You have idiopathic scoliosis with a curve, greater than 40 degrees.  This is a condition that is usually more progressive during childhood.  The scoliosis is associated with rib deformity and the crowding of the ribs on the left side is likely the cause of the left sided chest pain but scoliosis when it is severe can cause cardiopulmonary Problems that can result in a shortening of one's life span. You have seen a cardiologist and your heart is  Normal.  We are referring you to a pulmonary specialist to assess your lung function. The rib pain will be treated with a TENS unit and physical therapy. If therapy is of no benefit then a referral to a tertiary scoliosis center may be necessary if surgery is considered As the degree of scoliosis is such than a anterior (front of spine) and posterior (back of spine) combined approach may be necessary allow with left rib surgery to treat the condition.  Your exam is also showing signs that your left leg is shorter than your right leg. A CT scan is ordered to assess the degree of difference in the legs length.  Avoid arthritis medications as these are contraindicated in patient's with kidney conditions.   Follow-Up Instructions: Return in about 4 weeks (around 04/15/2021).   Orders:  Orders Placed This Encounter  Procedures   Ambulatory referral to Pulmonology   Ambulatory referral to Physical Therapy   No orders of the defined types were placed in this encounter.     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 Clinically she is decompensated to the left by 3 cm.  There is pelvic obliquity with left leg shorter than the right by 81mm by measure from the superior iliac crest to the lower cassette margin. Hip ROM shows mild decrease IR right hip compared with the left.    Subjective: Chief Complaint  Patient presents with   Spine - Pain    Has Scoliosis, c/o pain along left side, under the breast area, and across the chest. She has seen a cardiologist who has cleared her heart, and hx of Kidney transplant on right side.    35 year old female with history of thoracolumbar scoliosis. 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 She was referred for evaluation of her scoliosis and left rib-chest pain. Has been seen by cardiology with normal heart. She complains of pain left chest lateral ribs at about the D6-8 infra breast region with pain over the anterior sternum and upper manubrium. History of endstage renal failure now with  a cadaveric kidney transplant. She has been seen by [facility]essa [facility]varez and evaluated, apparently saw a therapist for exercises, she is still having intermittant pain that she related to change from standing to sitting and mother notes with movement of the chest or turning.   Review of Systems  Constitutional: Negative.   HENT: Negative.    Eyes: Negative.   Respiratory: Negative.    Cardiovascular: Negative.   Gastrointestinal: Negative.   Endocrine: Negative.   Genitourinary: Negative.   Musculoskeletal: Negative.   Skin: Negative.   Allergic/Immunologic: Negative.   Neurological: Negative.   Hematological: Negative.    Psychiatric/Behavioral: Negative.      Objective: Vital Signs: BP 122/79 (BP Location: Right Arm, Patient Position: Sitting)   Pulse 85   Ht 5\' 4"  (1.626 m)   Wt 198 lb (89.8 kg)   BMI 33.99 kg/m   Physical Exam Constitutional:      Appearance: She is well-developed.  HENT:     Head: Normocephalic and atraumatic.  Eyes:     Pupils: Pupils are equal, round, and reactive to light.  Pulmonary:     Effort: Pulmonary effort is normal.     Breath sounds: Normal breath sounds.  Abdominal:     General: Bowel sounds are normal.     Palpations: Abdomen is soft.  Musculoskeletal:        General: Normal range of motion.     Cervical back: Normal range of motion and neck supple.     Lumbar back: Negative right straight leg raise test and negative left straight leg raise test.  Skin:    General: Skin is warm and dry.  Neurological:     Mental Status: She is alert and oriented to person, place, and time.  Psychiatric:        Behavior: Behavior normal.        Thought Content: Thought content normal.        Judgment: Judgment normal.   Back Exam   Tenderness  The patient is experiencing tenderness in the lumbar and thoracic.  Range of Motion  Extension:  normal  Flexion:  normal   Muscle Strength  Right Quadriceps:  5/5  Left Quadriceps:  5/5  Right Hamstrings:  5/5  Left Hamstrings:  5/5   Tests  Straight leg raise right: negative Straight leg raise left: negative  Reflexes  Patellar:  2/4  Other  Toe walk: abnormal Heel walk: abnormal Sensation: normal Gait: normal  Erythema: no back redness Scars: absent  Comments:  Right posterior rib hump Crease left flank Right shoulder is elevated Right scapula is prominent compared with the left. Left leg is shorter than right, sitting the left leg is shorter from the knee to the foot. Motor is normal Right leg with one beat clonus Right eye strabismus unable to adduct the right eye. Bilateral dorsal radial hand  areas of patchy scaly skin, consistent with psoariasis.     Specialty Comments:  No specialty comments available.  Imaging: No results found.   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   Nausea & vomiting 02/12/2015   ESRD on dialysis ([place])    Nystagmus 08/22/2013   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   Headache 07/13/2011   ECZEMA, ATOPIC 11/05/2009   Hypothyroidism 03/12/2009   Obesity, Class II, BMI 35-39.9 02/26/2009   Essential hypertension 02/26/2009   Iron deficiency anemia 05/20/2008   Anxiety state 06/23/2006   MENTAL RETARDATION 06/23/2006   Past Medical History:  Diagnosis Date   Anemia    Iron Def   Anxiety    Autism spectrum    Behavioral problems    Depression    End stage renal disease ([place])    GERD (gastroesophageal reflux disease)    Hyperlipidemia    Hypertension    HYPOTHYROIDISM 03/12/2009   Qualifier: Diagnosis of  By: Ta MD, Cat     MENTAL RETARDATION 06/23/2006   Qualifier: Diagnosis of  By: Eusebio Friendly     Mood disorder [facility])    Sees Dr Silvio Pate, who prescribes meds   Shortness of breath dyspnea    with exertion    Family History  Problem Relation Age of Onset   Hypertension Mother    Diabetes Maternal Grandmother    Hypertension Maternal Grandmother    Heart disease Maternal Grandmother    Bladder Cancer Maternal Grandmother    Diabetes Maternal Grandfather    Cancer  Maternal Grandfather        prostate   Colon cancer Neg Hx    Pancreatic cancer Neg Hx    Esophageal cancer Neg Hx    Liver disease Neg Hx    Stomach cancer Neg Hx    Rectal cancer Neg Hx     Past Surgical History:  Procedure Laterality Date   AV FISTULA PLACEMENT Left 03/17/2015   Procedure: CREATION OF LEFT BRACHIOCEPHALIC ARTERIOVENOUS (AV) FISTULA ;  Surgeon: Conrad Onaway, MD;  Location: [place];  Service: Vascular;  Laterality: Left;   CHOLECYSTECTOMY  11/24/2004   INSERTION OF DIALYSIS CATHETER Right 03/17/2015   Procedure: INSERTION OF DIALYSIS CATHETER RIGHT INTERNAL JUIGULAR PLACEMENT;  Surgeon: Conrad , MD;  Location[facility];  Service: Vascular;  Laterality: Right;   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

## 2021-03-20 ENCOUNTER — Other Ambulatory Visit: Payer: Self-pay | Admitting: Family Medicine

## 2021-03-20 DIAGNOSIS — E039 Hypothyroidism, unspecified: Secondary | ICD-10-CM

## 2021-04-02 ENCOUNTER — Other Ambulatory Visit: Payer: Self-pay

## 2021-04-02 ENCOUNTER — Ambulatory Visit (HOSPITAL_COMMUNITY)
Admission: RE | Admit: 2021-04-02 | Discharge: 2021-04-02 | Disposition: A | Discharge status: Home / Self Care | Payer: Medicare Other | Source: Ambulatory Visit | Attending: Specialist | Admitting: Specialist

## 2021-04-02 DIAGNOSIS — F329 Major depressive disorder, single episode, unspecified: Secondary | ICD-10-CM | POA: Diagnosis not present

## 2021-04-02 DIAGNOSIS — M419 Scoliosis, unspecified: Secondary | ICD-10-CM | POA: Diagnosis not present

## 2021-04-02 DIAGNOSIS — F71 Moderate intellectual disabilities: Secondary | ICD-10-CM | POA: Diagnosis not present

## 2021-04-02 DIAGNOSIS — F319 Bipolar disorder, unspecified: Secondary | ICD-10-CM | POA: Diagnosis not present

## 2021-04-02 DIAGNOSIS — F422 Mixed obsessional thoughts and acts: Secondary | ICD-10-CM | POA: Diagnosis not present

## 2021-04-06 DIAGNOSIS — Z940 Kidney transplant status: Secondary | ICD-10-CM | POA: Diagnosis not present

## 2021-04-06 NOTE — Progress Notes (Signed)
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   She has left sided CP - sometimes throughout the day. Not aware of any exacerbating factors. At first she says not pleuritic but mother reminds her that sometimes deep breaths worsen pain, worsens also with movement per pt's mother. She does not think it is exertional. She hasn't tried any medications topical or otherwise to see if helpful. She has no cough. No fever. No history of VTE. No history of trauma to left side or MVA.   She never was infected with covid-19. She was vaccinated for covid-19.   Otherwise pertinent review of systems is negative.  No family history of lung disease  She walks, watches TV, sleeps through much of the day. She has not had any kind of employment. She lives with her mother in [place]. She has never lived outside of [place]. No recent travel. No pets at home.   Past Medical History:  Diagnosis Date   Anemia    Iron Def   Anxiety    Autism spectrum    Behavioral problems    Depression    End stage renal disease ([facility])    GERD (gastroesophageal reflux disease)    Hyperlipidemia    Hypertension    HYPOTHYROIDISM 03/12/2009   Qualifier: Diagnosis of  By: Ta MD, Cat     MENTAL RETARDATION 06/23/2006   Qualifier: Diagnosis of  By: Eusebio Friendly     Mood disorder [facility])    Sees Dr Silvio Pate, who prescribes meds   Shortness of breath dyspnea    with exertion     Family History  Problem Relation Age of Onset   Hypertension Mother    Diabetes Maternal Grandmother    Hypertension Maternal Grandmother    Heart disease Maternal Grandmother     Bladder Cancer Maternal Grandmother    Diabetes Maternal Grandfather    Cancer Maternal Grandfather        prostate   Colon cancer Neg Hx    Pancreatic cancer Neg Hx    Esophageal cancer Neg Hx    Liver disease Neg Hx    Stomach cancer Neg Hx    Rectal cancer Neg Hx      Past Surgical History:  Procedure Laterality Date   AV FISTULA PLACEMENT Left 03/17/2015   Procedure: CREATION OF LEFT BRACHIOCEPHALIC ARTERIOVENOUS (AV) FISTULA ;  Surgeon: Conrad Westbrook, MD;  Location: [place];  Service: Vascular;  Laterality: Left;   CHOLECYSTECTOMY  11/24/2004   INSERTION OF DIALYSIS CATHETER Right 03/17/2015   Procedure: INSERTION OF DIALYSIS CATHETER RIGHT INTERNAL JUIGULAR PLACEMENT;  Surgeon: Conrad Avondale, MD;  [facility];  Service: Vascular;  Laterality: Right;   KIDNEY TRANSPLANT Right 11/2019    Social History   Socioeconomic History   Marital status: Single    Spouse name: Not on file   Number of children: 0   Years of education: Not on file   Highest education level: Not on file  Occupational History   Occupation: disabled  Tobacco Use   Smoking status: Never   Smokeless tobacco: Never  Vaping Use  Vaping Use: Never used  Substance and Sexual Activity   Alcohol use: No    Alcohol/week: 0.0 standard drinks   Drug use: No   Sexual activity: Never  Other Topics Concern   Not on file  Social History Narrative   ** Merged History Encounter **       Lives with mother.   Very sedentary livestyle.   No etoh, tobacco, drugs, intercourse.      MR.    Social Determinants of Health   Financial Resource Strain: Not on file  Food Insecurity: Not on file  Transportation Needs: Not on file  Physical Activity: Not on file  Stress: Not on file  Social Connections: Not on file  Intimate Partner Violence: Not on file     No Known Allergies   Outpatient Medications Prior to Visit  Medication Sig Dispense Refill   amLODipine (NORVASC) 10 MG tablet Take 1 tablet by mouth  daily.     aripiprazole (ABILIFY) 10 MG disintegrating tablet Take 10 mg by mouth at bedtime.     aspirin EC 81 MG tablet Take 81 mg by mouth daily. Swallow whole.     busPIRone (BUSPAR) 10 MG tablet Take 10 mg by mouth 3 (three) times daily.     ferrous sulfate 325 (65 FE) MG tablet Take 1 tablet (325 mg total) by mouth 2 (two) times daily. 180 tablet 3   hydrOXYzine (ATARAX/VISTARIL) 25 MG tablet Take 25 mg by mouth 3 (three) times daily as needed.     lamoTRIgine (LAMICTAL) 200 MG tablet Take 100 mg by mouth every morning. Takes 1 tablet at night and 1/2 tablet in the AM     levothyroxine (SYNTHROID) 112 MCG tablet TAKE 1 TABLET BY MOUTH EVERY DAY BEFORE BREAKFAST 30 tablet 0   metoprolol succinate (TOPROL-XL) 25 MG 24 hr tablet Take 1 tablet by mouth daily.     mycophenolate (MYFORTIC) 180 MG EC tablet Take 180 mg by mouth See admin instructions. Take 3 tablets in the morning and 3 tablets in the evening     omeprazole (PRILOSEC) 20 MG capsule Take 1 capsule (20 mg total) by mouth 2 (two) times daily before a meal. 60 capsule 6   polyethylene glycol powder (GLYCOLAX/MIRALAX) 17 GM/SCOOP powder Take 17 g by mouth daily as needed for mild constipation. Reported on 05/26/2015 255 g 3   predniSONE (DELTASONE) 5 MG tablet Take 5 mg by mouth daily.     sennosides-docusate sodium (SENOKOT-S) 8.6-50 MG tablet Take 1 tablet by mouth in the morning and at bedtime.     sertraline (ZOLOFT) 100 MG tablet Take 100 mg by mouth daily.      sucralfate (CARAFATE) 1 GM/10ML suspension Take 10 mLs (1 g total) by mouth at bedtime. 30 mL 11   Tacrolimus ER 4 MG TB24 Take 8 mg by mouth daily.     No facility-administered medications prior to visit.       Objective:   Physical Exam:  General appearance: 35 y.o., female, NAD, conversant  Eyes: anicteric sclerae; PERRL, tracking appropriately HENT: NCAT; MMM Neck: Trachea midline; no lymphadenopathy, no JVD Lungs: CTAB, no crackles, no wheeze, with normal  respiratory effort CV: RRR, no murmur  Abdomen: Soft, non-tender; non-distended, BS present  Extremities: No peripheral edema, warm Skin: Normal turgor and texture; no rash Psych: Appropriate affect Neuro: Alert and oriented to person and place, no focal deficit     Vitals:   04/07/21 0855  BP: 134/74  Pulse: 91  Temp: 98.3 F (36.8 C)  TempSrc: Oral  SpO2: 99%  Weight: 202 lb 3.2 oz (91.7 kg)  Height: 5\' 4"  (1.626 m)   99% on RA BMI Readings from Last 3 Encounters:  04/07/21 34.71 kg/m  03/18/21 33.99 kg/m  12/05/20 32.96 kg/m   Wt Readings from Last 3 Encounters:  04/07/21 202 lb 3.2 oz (91.7 kg)  03/18/21 198 lb (89.8 kg)  12/05/20 192 lb (87.1 kg)     CBC    Component Value Date/Time   WBC 5.1 05/04/2017 0937   WBC 6.2 12/22/2015 0907   RBC 4.02 05/04/2017 0937   RBC 4.13 12/22/2015 0907   HGB 11.5 05/04/2017 0937   HCT 35.2 05/04/2017 0937   PLT 216 05/04/2017 0937   MCV 88 05/04/2017 0937   MCH 28.6 05/04/2017 0937   MCH 28.1 12/22/2015 0907   MCHC 32.7 05/04/2017 0937   MCHC 33.9 12/22/2015 0907   RDW 14.8 05/04/2017 0937   LYMPHSABS 1,922 12/22/2015 0907   MONOABS 372 12/22/2015 0907   EOSABS 186 12/22/2015 0907   BASOSABS 0 12/22/2015 0907     Chest Imaging: CT Chest 09/2020 reviewed by me remarkable for scoliosis - no bronchiectasis/scar  Pulmonary Functions Testing Results: No flowsheet data found.   Echocardiogram:   TTE 2018: - Left ventricle: The cavity size was normal. Wall thickness was    normal. Systolic function was normal. The estimated ejection    fraction was in the range of 60% to 65%. Wall motion was normal;    there were no regional wall motion abnormalities. Left    ventricular diastolic function parameters were normal.  - Mitral valve: There was mild regurgitation. Valve area by    continuity equation (using LVOT flow): 1.97 cm^2.  - Atrial septum: No defect or patent foramen ovale was identified.        Assessment & Plan:   # Scoliosis: Could be at risk for restrictive ventilatory defect, weak cough.  # left sided pleuritic CP: Potentially related to nerve root/intercostal nerve compression due to scoliosis, undergoing therapeutic trial of TENS unit, PT.  Plan: - PFTs with spiro, lung volumes, DLCO, MIP, MEP     Maryjane Hurter, MD [facility] 04/07/2021 9:14 AM

## 2021-04-07 ENCOUNTER — Other Ambulatory Visit: Payer: Self-pay

## 2021-04-07 ENCOUNTER — Ambulatory Visit (INDEPENDENT_AMBULATORY_CARE_PROVIDER_SITE_OTHER): Payer: Medicare Other | Admitting: Student

## 2021-04-07 ENCOUNTER — Encounter: Payer: Self-pay | Admitting: Student

## 2021-04-07 VITALS — BP 134/74 | HR 91 | Temp 98.3°F | Ht 64.0 in | Wt 202.2 lb

## 2021-04-07 DIAGNOSIS — M4124 Other idiopathic scoliosis, thoracic region: Secondary | ICD-10-CM

## 2021-04-07 NOTE — Patient Instructions (Signed)
Stop by front desk to schedule follow up with me in about a month. You will be called to schedule breathing tests (PFTs) on same day.

## 2021-04-08 ENCOUNTER — Ambulatory Visit: Payer: Medicare Other

## 2021-04-16 ENCOUNTER — Encounter: Payer: Self-pay | Admitting: Specialist

## 2021-04-16 ENCOUNTER — Ambulatory Visit (INDEPENDENT_AMBULATORY_CARE_PROVIDER_SITE_OTHER): Payer: Medicare Other | Admitting: Specialist

## 2021-04-16 ENCOUNTER — Other Ambulatory Visit: Payer: Self-pay

## 2021-04-16 VITALS — BP 132/76 | HR 90 | Ht 64.0 in | Wt 198.0 lb

## 2021-04-16 DIAGNOSIS — Z940 Kidney transplant status: Secondary | ICD-10-CM | POA: Diagnosis not present

## 2021-04-16 DIAGNOSIS — M4125 Other idiopathic scoliosis, thoracolumbar region: Secondary | ICD-10-CM | POA: Diagnosis not present

## 2021-04-16 DIAGNOSIS — M419 Scoliosis, unspecified: Secondary | ICD-10-CM | POA: Diagnosis not present

## 2021-04-16 DIAGNOSIS — R0781 Pleurodynia: Secondary | ICD-10-CM

## 2021-04-16 DIAGNOSIS — M2170 Unequal limb length (acquired), unspecified site: Secondary | ICD-10-CM

## 2021-04-16 DIAGNOSIS — R0789 Other chest pain: Secondary | ICD-10-CM

## 2021-04-16 NOTE — Progress Notes (Signed)
Office Visit Note   Patient: Vanessa Alvarez           Date of Birth: 12/06/1985           MRN: [id number] Visit Date: 04/16/2021              Requested by: [name], MD [place],  [place] [id number] PCP: [name], MD   Assessment & Plan: Visit Diagnoses:  1. Rib pain on left side   2. Leg length discrepancy   3. Other idiopathic scoliosis, thoracolumbar region   4. Scoliosis of thoracolumbar spine, unspecified scoliosis type     Plan: Avoid frequent bending and stooping  No lifting greater than 10 lbs. May use ice or moist heat for pain. Weight loss is of benefit. Best medication for lumbar disc disease is arthritis medications like tylenol, CBD or hemp is a consideration.  Zostrix creame is an over the counter remedy for nerve pain but should be used for 2 weeks before a person may experience decreased nerve pain. Consider rescheduling appointment to see a physical therapist for stretching exercises and for TENS Trial. Without an adequate conservative treatment trial it would be nearly impossible to recommend surgical Solution due to the risk of surgery, either instrumentation of the thoracolumbar scoliosis or rib resection Surgery.  Exercise is important to improve your indurance and does allow people to function better inspite of back pain.    Follow-Up Instructions: No follow-ups on file.   Orders:  No orders of the defined types were placed in this encounter.  No orders of the defined types were placed in this encounter.     Procedures: No procedures performed   Clinical Data: Findings:  CLINICAL DATA:  Scoliosis, evaluate for limb length discrepancy   EXAM: CT OF THE LOWER BILATERAL EXTREMITY WITHOUT CONTRAST   TECHNIQUE: Multidetector CT imaging of the lower bilateral extremity was performed according to the standard protocol.   COMPARISON:  None.   FINDINGS: No acute fracture or dislocation. No aggressive osseous lesion. Normal  alignment. Joint spaces are maintained.   Right lower extremity limb length from the superior aspect of the femoral head to the mid tibial plafond measures 83.8 cm.   Left lower extremity limb length from the superior aspect of the femoral head to the mid tibial plafond measures 83.3 cm.   Soft tissue are unremarkable. No radiopaque foreign body or soft tissue emphysema.   IMPRESSION: Bilateral lower extremity limb length as described above.     Electronically Signed   By: Kathreen Devoid M.D.   On: 04/03/2021 08:22   Subjective: Chief Complaint  Patient presents with   Spine - Follow-up    Review CT for Bone Length    35 year old female with history of thoracolumbar scoliosis and she is having Pleuritic chest pain that may be intercostal neuralgia due to rib proximity to each other on the concave side of the thoracic curve. She has seen a pulmonary specialist and is to undergo evaluation of her PFTs to determine If she has any sign of compromise in her lung function. SHe had radiographs demostrating a leg length discrepancy that appeared significant and a CT scanogram of the leg to determine her leg lengths was done.    Review of Systems  Constitutional: Negative.   HENT: Negative.    Eyes: Negative.   Respiratory: Negative.    Cardiovascular: Negative.   Gastrointestinal: Negative.   Endocrine: Negative.   Genitourinary: Negative.  Musculoskeletal: Negative.   Skin: Negative.   Allergic/Immunologic: Negative.   Neurological: Negative.   Hematological: Negative.   Psychiatric/Behavioral: Negative.      Objective: Vital Signs: BP 132/76 (BP Location: Left Arm, Patient Position: Sitting)    Pulse 90    Ht 5\' 4"  (1.626 m)    Wt 198 lb (89.8 kg)    BMI 33.99 kg/m   Physical Exam Constitutional:      Appearance: She is well-developed.  HENT:     Head: Normocephalic and atraumatic.  Eyes:     Pupils: Pupils are equal, round, and reactive to light.  Pulmonary:      Effort: Pulmonary effort is normal.     Breath sounds: Normal breath sounds.  Abdominal:     General: Bowel sounds are normal.     Palpations: Abdomen is soft.  Musculoskeletal:        General: Normal range of motion.     Cervical back: Normal range of motion and neck supple.  Skin:    General: Skin is warm and dry.  Neurological:     Mental Status: She is alert and oriented to person, place, and time.  Psychiatric:        Behavior: Behavior normal.        Thought Content: Thought content normal.        Judgment: Judgment normal.    Ortho Exam  Specialty Comments:  No specialty comments available.  Imaging: No results found.   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   Nausea & vomiting 02/12/2015   ESRD on dialysis ([facility])    Nystagmus 08/22/2013   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   Headache 07/13/2011   ECZEMA, ATOPIC 11/05/2009   Hypothyroidism 03/12/2009   Obesity, Class II, BMI 35-39.9 02/26/2009   Essential hypertension 02/26/2009   Iron deficiency anemia 05/20/2008   Anxiety state 06/23/2006   MENTAL RETARDATION 06/23/2006   Past Medical History:  Diagnosis Date   Anemia    Iron Def   Anxiety    Autism spectrum    Behavioral problems    Depression    End stage renal disease ([facility])    GERD (gastroesophageal reflux disease)    Hyperlipidemia    Hypertension    HYPOTHYROIDISM 03/12/2009    Qualifier: Diagnosis of  By: Ta MD, Cat     MENTAL RETARDATION 06/23/2006   Qualifier: Diagnosis of  By: Eusebio Friendly     Mood disorder [facility])    Sees Dr Silvio Pate, who prescribes meds   Shortness of breath dyspnea    with exertion    Family History  Problem Relation Age of Onset   Hypertension Mother    Diabetes Maternal Grandmother    Hypertension Maternal Grandmother    Heart disease Maternal Grandmother    Bladder Cancer Maternal Grandmother    Diabetes Maternal Grandfather    Cancer Maternal Grandfather        prostate   Colon cancer Neg Hx  Pancreatic cancer Neg Hx    Esophageal cancer Neg Hx    Liver disease Neg Hx    Stomach cancer Neg Hx    Rectal cancer Neg Hx     Past Surgical History:  Procedure Laterality Date   AV FISTULA PLACEMENT Left 03/17/2015   Procedure: CREATION OF LEFT BRACHIOCEPHALIC ARTERIOVENOUS (AV) FISTULA ;  Surgeon: Conrad [place], MD;  Location: [facility];  Service: Vascular;  Laterality: Left;   CHOLECYSTECTOMY  11/24/2004   INSERTION OF DIALYSIS CATHETER Right 03/17/2015   Procedure: INSERTION OF DIALYSIS CATHETER RIGHT INTERNAL JUIGULAR PLACEMENT;  Surgeon: Conrad [place], MD;  Location: [facility];  Service: Vascular;  Laterality: Right;   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

## 2021-04-16 NOTE — Patient Instructions (Addendum)
Plan: Avoid frequent bending and stooping  No lifting greater than 10 lbs. May use ice or moist heat for pain. Weight loss is of benefit. Best medication for lumbar disc disease is arthritis medications like tylenol, CBD or hemp is a consideration.  Zostrix creame is an over the counter remedy for nerve pain but should be used for 2 weeks before a person may experience decreased nerve pain. Consider rescheduling appointment to see a physical therapist for stretching exercises and for TENS Trial. Without an adequate conservative treatment trial it would be nearly impossible to recommend surgical Solution due to the risk of surgery, either instrumentation of the thoracolumbar scoliosis or rib resection Surgery.  Exercise is important to improve your indurance and does allow people to function better inspite of back pain. A Dr. Zoe Lan shoe insert for the left shoe helps to equal the leg lengths but it will not affect the chest discomfort. People can tolerated up to 2 cm of leg length discrepancy without needing an insert or correction, The degree of leg difference in your case is 0.5cm.

## 2021-04-20 ENCOUNTER — Other Ambulatory Visit: Payer: Self-pay | Admitting: Family Medicine

## 2021-04-20 DIAGNOSIS — E039 Hypothyroidism, unspecified: Secondary | ICD-10-CM

## 2021-04-24 DIAGNOSIS — F319 Bipolar disorder, unspecified: Secondary | ICD-10-CM | POA: Diagnosis not present

## 2021-04-24 DIAGNOSIS — F329 Major depressive disorder, single episode, unspecified: Secondary | ICD-10-CM | POA: Diagnosis not present

## 2021-04-28 ENCOUNTER — Ambulatory Visit (INDEPENDENT_AMBULATORY_CARE_PROVIDER_SITE_OTHER): Payer: Medicare Other | Admitting: Family Medicine

## 2021-04-28 ENCOUNTER — Encounter: Payer: Self-pay | Admitting: Family Medicine

## 2021-04-28 ENCOUNTER — Other Ambulatory Visit: Payer: Self-pay

## 2021-04-28 VITALS — BP 130/81 | HR 88 | Ht 64.0 in | Wt 205.2 lb

## 2021-04-28 DIAGNOSIS — M25551 Pain in right hip: Secondary | ICD-10-CM | POA: Diagnosis not present

## 2021-04-28 DIAGNOSIS — E039 Hypothyroidism, unspecified: Secondary | ICD-10-CM | POA: Diagnosis not present

## 2021-04-28 NOTE — Assessment & Plan Note (Signed)
She is due for TSH.  Unfortunately lab has closed, so future orders placed and she will return for this.

## 2021-04-28 NOTE — Patient Instructions (Addendum)
It was nice seeing you today!  You can try Tylenol as needed for pain. Voltaren gel may be  helpful as well.  Do hip exercises multiple times a day, at least 5 days a week if you are able. Focus on hip abductor exercises.  Let us know if you change your mind and want a referral for PT.  Come back to have blood work drawn to check your thyroid.  Please arrive at least 15 minutes prior to your scheduled appointments.  Stay well, Vanessa Button, MD [place] [phone]

## 2021-04-28 NOTE — Assessment & Plan Note (Addendum)
It appears she has had issues with chronic right hip pain previously.  This is acute, nontraumatic, right hip pain primarily occurring with ambulation.  Exam does not suggest any involvement of the hip joint itself.  More likely MSK, possibly hip abductor weakness.  Do wonder if underlying scoliosis is playing a role.  Patient declined PT but amenable to home exercises which were given.  Also can use Tylenol or Voltaren gel as needed for pain.

## 2021-04-28 NOTE — Progress Notes (Signed)
° ° °  SUBJECTIVE:   CHIEF COMPLAINT / HPI:   Right hip pain Brought in by mother.  Reports right hip pain started about 2 weeks ago, no known injury or trauma.  Pain started while she was walking 1 day.  Does not have much pain at rest, primarily when walking.  She has not taken any medications for this.  Mother states that she did see orthopedics recently and wonders if a physical exam may have caused her pain.  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  She has some pain with lateral flexion to the left but otherwise has good range of motion of the spine.  Negative straight leg raise bilaterally.  There is no pain elicited with internal or external rotation of the hip bilaterally.  She has good strength with hip flexion.  Questionable weakness with right hip abduction and positive Trendelenburg on the right.  ASSESSMENT/PLAN:   Right hip pain It appears she has had issues with chronic right hip pain previously.  This is acute, nontraumatic, right hip pain primarily occurring with ambulation.  Exam does not suggest any involvement of the hip joint itself.  More likely MSK, possibly hip abductor weakness.  Do wonder if underlying scoliosis is playing a role.  Patient declined PT but amenable to home exercises which were given.  Also can use Tylenol or Voltaren gel as needed for pain.  Hypothyroidism She is due for TSH.  Unfortunately lab has closed, so future orders placed and she will return for this.     [name], MD [place]

## 2021-05-01 ENCOUNTER — Other Ambulatory Visit: Payer: Medicare Other

## 2021-05-01 ENCOUNTER — Other Ambulatory Visit: Payer: Self-pay

## 2021-05-01 DIAGNOSIS — E039 Hypothyroidism, unspecified: Secondary | ICD-10-CM

## 2021-05-02 LAB — TSH: TSH: 3.39 u[IU]/mL (ref 0.450–4.500)

## 2021-05-04 ENCOUNTER — Other Ambulatory Visit: Payer: Self-pay | Admitting: Physician Assistant

## 2021-05-07 ENCOUNTER — Other Ambulatory Visit: Payer: Self-pay | Admitting: Family Medicine

## 2021-05-07 DIAGNOSIS — E039 Hypothyroidism, unspecified: Secondary | ICD-10-CM

## 2021-05-26 NOTE — Progress Notes (Signed)
Synopsis: Referred for pleuritic CP by [name], MD  Subjective:   PATIENT ID: Vanessa Alvarez GENDER: female DOB: 07-03-1985, MRN: [id number]  Chief Complaint  Patient presents with   Follow-up    PFT's done today. She states her breathing is unchanged. She has occ non prod cough and says sometimes has chest discomfort.    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   She has left sided CP - sometimes throughout the day. Not aware of any exacerbating factors. At first she says not pleuritic but mother reminds her that sometimes deep breaths worsen pain, worsens also with movement per pt's mother. She does not think it is exertional. She hasn't tried any medications topical or otherwise to see if helpful. She has no cough. No fever. No history of VTE. No history of trauma to left side or MVA.   She never was infected with covid-19. She was vaccinated for covid-19.   No family history of lung disease  She walks, watches TV, sleeps through much of the day. She has not had any kind of employment. She lives with her mother in [place]. She has never lived outside of [place]. No recent travel. No pets at home.   Interval HPI: Last seen by me 04/07/21 with plan for PFT including MIP/MEP. She does have intermittent cough, not productive. Occasional DOE but unchanged since last visit. She does feel kind of sleepy during the day - she is unsure if it is due to medications. She does snore loudly sometimes. No PND.   Otherwise pertinent review of systems is negative.   Past Medical History:  Diagnosis Date   Anemia    Iron Def   Anxiety    Autism spectrum    Behavioral problems    Depression    End stage renal disease (H[facility])    GERD (gastroesophageal reflux disease)    Hyperlipidemia    Hypertension    HYPOTHYROIDISM 03/12/2009   Qualifier: Diagnosis of  By: Ta MD,  Cat     MENTAL RETARDATION 06/23/2006   Qualifier: Diagnosis of  By: Eusebio Friendly     Mood disorder [facility])    Sees Dr Silvio Pate, who prescribes meds   Shortness of breath dyspnea    with exertion     Family History  Problem Relation Age of Onset   Hypertension Mother    Diabetes Maternal Grandmother    Hypertension Maternal Grandmother    Heart disease Maternal Grandmother    Bladder Cancer Maternal Grandmother    Diabetes Maternal Grandfather    Cancer Maternal Grandfather        prostate   Colon cancer Neg Hx    Pancreatic cancer Neg Hx    Esophageal cancer Neg Hx    Liver disease Neg Hx    Stomach cancer Neg Hx    Rectal cancer Neg Hx      Past Surgical History:  Procedure Laterality Date   AV FISTULA PLACEMENT Left 03/17/2015   Procedure: CREATION OF LEFT BRACHIOCEPHALIC ARTERIOVENOUS (AV) FISTULA ;  Surgeon: Conrad [place], MD;  Location: [facility];  Service: Vascular;  Laterality: Left;   CHOLECYSTECTOMY  11/24/2004   INSERTION OF DIALYSIS CATHETER Right 03/17/2015   Procedure: INSERTION OF DIALYSIS CATHETER RIGHT INTERNAL JUIGULAR PLACEMENT;  Surgeon: Conrad [place], MD;  Location: [facility] [place];  Service: Vascular;  Laterality: Right;   KIDNEY TRANSPLANT  Right 11/2019    Social History   Socioeconomic History   Marital status: Single    Spouse name: Not on file   Number of children: 0   Years of education: Not on file   Highest education level: Not on file  Occupational History   Occupation: disabled  Tobacco Use   Smoking status: Never   Smokeless tobacco: Never  Vaping Use   Vaping Use: Never used  Substance and Sexual Activity   Alcohol use: No    Alcohol/week: 0.0 standard drinks   Drug use: No   Sexual activity: Never  Other Topics Concern   Not on file  Social History Narrative   ** Merged History Encounter **       Lives with mother.   Very sedentary livestyle.   No etoh, tobacco, drugs, intercourse.      MR.    Social Determinants of Health    Financial Resource Strain: Not on file  Food Insecurity: Not on file  Transportation Needs: Not on file  Physical Activity: Not on file  Stress: Not on file  Social Connections: Not on file  Intimate Partner Violence: Not on file     No Known Allergies   Outpatient Medications Prior to Visit  Medication Sig Dispense Refill   amLODipine (NORVASC) 10 MG tablet Take 1 tablet by mouth daily.     aripiprazole (ABILIFY) 10 MG disintegrating tablet Take 10 mg by mouth at bedtime.     aspirin EC 81 MG tablet Take 81 mg by mouth daily. Swallow whole.     busPIRone (BUSPAR) 10 MG tablet Take 10 mg by mouth 3 (three) times daily.     ferrous sulfate 325 (65 FE) MG tablet Take 1 tablet (325 mg total) by mouth 2 (two) times daily. 180 tablet 3   hydrOXYzine (ATARAX/VISTARIL) 25 MG tablet Take 25 mg by mouth 3 (three) times daily as needed.     lamoTRIgine (LAMICTAL) 200 MG tablet Take 100 mg by mouth every morning. Takes 1 tablet at night and 1/2 tablet in the AM     levothyroxine (SYNTHROID) 112 MCG tablet TAKE 1 TABLET BY MOUTH EVERY DAY BEFORE BREAKFAST 30 tablet 5   metoprolol succinate (TOPROL-XL) 25 MG 24 hr tablet Take 1 tablet (25 mg total) by mouth daily. 30 tablet 0   mycophenolate (MYFORTIC) 180 MG EC tablet Take 180 mg by mouth See admin instructions. Take 3 tablets in the morning and 3 tablets in the evening     omeprazole (PRILOSEC) 20 MG capsule TAKE 1 CAPSULE (20 MG TOTAL) BY MOUTH 2 (TWO) TIMES DAILY BEFORE A MEAL. 60 capsule 6   polyethylene glycol powder (GLYCOLAX/MIRALAX) 17 GM/SCOOP powder Take 17 g by mouth daily as needed for mild constipation. Reported on 05/26/2015 255 g 3   predniSONE (DELTASONE) 5 MG tablet Take 5 mg by mouth daily.     sennosides-docusate sodium (SENOKOT-S) 8.6-50 MG tablet Take 1 tablet by mouth in the morning and at bedtime.     sertraline (ZOLOFT) 100 MG tablet Take 100 mg by mouth daily.      tacrolimus (PROGRAF) 1 MG capsule Take 3 mg by mouth  daily.     tacrolimus ER (ENVARSUS XR) 4 MG TB24 Take 4 mg by mouth daily before breakfast.     sucralfate (CARAFATE) 1 GM/10ML suspension Take 10 mLs (1 g total) by mouth at bedtime. 30 mL 11   Tacrolimus ER 4 MG TB24 Take 8 mg by mouth daily.  No facility-administered medications prior to visit.       Objective:   Physical Exam:  General appearance: 36 y.o., female, NAD, conversant  Eyes: anicteric sclerae; PERRL, tracking appropriately HENT: NCAT; MMM Neck: Trachea midline; no lymphadenopathy, no JVD Lungs: CTAB, no crackles, no wheeze, with normal respiratory effort CV: RRR, no murmur  Abdomen: Soft, non-tender; non-distended, BS present  Extremities: No peripheral edema, warm Skin: Normal turgor and texture; no rash Psych: Appropriate affect Neuro: Alert and oriented to person and place, no focal deficit      Vitals:   05/28/21 0959  BP: 118/76  Pulse: 93  Temp: 98.7 F (37.1 C)  TempSrc: Oral  SpO2: 97%  Weight: 208 lb (94.3 kg)  Height: 5\' 4"  (1.626 m)    97% on RA BMI Readings from Last 3 Encounters:  05/28/21 35.70 kg/m  04/28/21 35.22 kg/m  04/16/21 33.99 kg/m   Wt Readings from Last 3 Encounters:  05/28/21 208 lb (94.3 kg)  04/28/21 205 lb 3.2 oz (93.1 kg)  04/16/21 198 lb (89.8 kg)     CBC    Component Value Date/Time   WBC 5.1 05/04/2017 0937   WBC 6.2 12/22/2015 0907   RBC 4.02 05/04/2017 0937   RBC 4.13 12/22/2015 0907   HGB 11.5 05/04/2017 0937   HCT 35.2 05/04/2017 0937   PLT 216 05/04/2017 0937   MCV 88 05/04/2017 0937   MCH 28.6 05/04/2017 0937   MCH 28.1 12/22/2015 0907   MCHC 32.7 05/04/2017 0937   MCHC 33.9 12/22/2015 0907   RDW 14.8 05/04/2017 0937   LYMPHSABS 1,922 12/22/2015 0907   MONOABS 372 12/22/2015 0907   EOSABS 186 12/22/2015 0907   BASOSABS 0 12/22/2015 0907     Chest Imaging: CT Chest 09/2020 reviewed by me remarkable for scoliosis - no bronchiectasis/scar  Pulmonary Functions Testing Results: PFT  Results Latest Ref Rng & Units 05/28/2021  FVC-Pre L 2.15  FVC-Predicted Pre % 68  FVC-Post L 2.32  FVC-Predicted Post % 73  Pre FEV1/FVC % % 90  Post FEV1/FCV % % 88  FEV1-Pre L 1.94  FEV1-Predicted Pre % 74  FEV1-Post L 2.03   Reviewed by me with mild restriction   Echocardiogram:   TTE 2018: - Left ventricle: The cavity size was normal. Wall thickness was    normal. Systolic function was normal. The estimated ejection    fraction was in the range of 60% to 65%. Wall motion was normal;    there were no regional wall motion abnormalities. Left    ventricular diastolic function parameters were normal.  - Mitral valve: There was mild regurgitation. Valve area by    continuity equation (using LVOT flow): 1.97 cm^2.  - Atrial septum: No defect or patent foramen ovale was identified.        Assessment & Plan:   # Scoliosis: # Restrictive ventilatory defect Could be at risk for weak cough but currently no chest congestion and she has not had recurrent respiratory infections. Only potentially related symptom could be daytime sleepines (potential manifestation of chronic hypoventilation) but this is confounded by her medications.   # left sided pleuritic CP: Potentially related to nerve root/intercostal nerve compression due to scoliosis, undergoing therapeutic trial of TENS unit, PT.  Plan: - they will send message or call if she is interested in pursuing ABG to see if evidence of chronic hypoventilation - we could consider trial of NIV for thoracic cage deformity resulting in restriction and hypoventilation to see if  it improves her daytime sleepiness. They decline to pursue this now. - encouraged to reach out if she develops recurrent lung infections, chronic chest congestion as we may need to consider implementing measures to promote airway clearance. Not an issue at present.     Maryjane Hurter, MD [facility] 05/28/2021 3:22 PM

## 2021-05-27 ENCOUNTER — Other Ambulatory Visit: Payer: Self-pay

## 2021-05-27 MED ORDER — METOPROLOL SUCCINATE ER 25 MG PO TB24: 25.0000 mg | ORAL_TABLET | Freq: Every day | ORAL | 0 refills | Status: DC

## 2021-05-28 ENCOUNTER — Ambulatory Visit (INDEPENDENT_AMBULATORY_CARE_PROVIDER_SITE_OTHER): Payer: Medicare Other | Admitting: Student

## 2021-05-28 ENCOUNTER — Other Ambulatory Visit: Payer: Self-pay

## 2021-05-28 ENCOUNTER — Encounter: Payer: Self-pay | Admitting: Student

## 2021-05-28 VITALS — BP 118/76 | HR 93 | Temp 98.7°F | Ht 64.0 in | Wt 208.0 lb

## 2021-05-28 DIAGNOSIS — R942 Abnormal results of pulmonary function studies: Secondary | ICD-10-CM

## 2021-05-28 DIAGNOSIS — M4124 Other idiopathic scoliosis, thoracic region: Secondary | ICD-10-CM

## 2021-05-28 LAB — PULMONARY FUNCTION TEST
FEF 25-75 Post: 2.9 L/sec
FEF 25-75 Pre: 2.69 L/sec
FEF2575-%Change-Post: 7 %
FEF2575-%Pred-Post: 97 %
FEF2575-%Pred-Pre: 90 %
FEV1-%Change-Post: 4 %
FEV1-%Pred-Post: 77 %
FEV1-%Pred-Pre: 74 %
FEV1-Post: 2.03 L
FEV1-Pre: 1.94 L
FEV1FVC-%Change-Post: -3 %
FEV1FVC-%Pred-Pre: 107 %
FEV6-%Change-Post: 7 %
FEV6-%Pred-Post: 75 %
FEV6-%Pred-Pre: 69 %
FEV6-Post: 2.32 L
FEV6-Pre: 2.15 L
FEV6FVC-%Pred-Post: 101 %
FEV6FVC-%Pred-Pre: 101 %
FVC-%Change-Post: 7 %
FVC-%Pred-Post: 73 %
FVC-%Pred-Pre: 68 %
FVC-Post: 2.32 L
FVC-Pre: 2.15 L
Post FEV1/FVC ratio: 88 %
Post FEV6/FVC ratio: 100 %
Pre FEV1/FVC ratio: 90 %
Pre FEV6/FVC Ratio: 100 %

## 2021-05-28 NOTE — Patient Instructions (Addendum)
-   Some patients with restrictive lung disease have chronic hypoventilation that can make them sleepy during the day or consistently short of breath with exertion. We could check for this with an arterial blood gas (done at [facility] or [facility]). If you do have chronic hypoventilation you would qualify for night time noninvasive ventilation (a machine kind of like CPAP) - goal of it would be to see if it helped you feel more alert during the day (although medications may be more likely contributing here), and potentially improve shortness of breath with exertion. Happy to pursue this if you guys like. - If you develop recurrent lung infections, chest congestion I would also be happy to see you in clinic for this to discuss.

## 2021-05-28 NOTE — Progress Notes (Signed)
Spirometry pre and post/Nitrogen washout performed today.

## 2021-05-28 NOTE — Patient Instructions (Signed)
Spiromety pre and post/ Nitrogen washout performed today.

## 2021-06-03 ENCOUNTER — Ambulatory Visit (INDEPENDENT_AMBULATORY_CARE_PROVIDER_SITE_OTHER): Payer: Medicare Other | Admitting: Specialist

## 2021-06-03 ENCOUNTER — Other Ambulatory Visit: Payer: Self-pay

## 2021-06-03 ENCOUNTER — Encounter: Payer: Self-pay | Admitting: Specialist

## 2021-06-03 VITALS — BP 129/80 | HR 86 | Ht 64.0 in | Wt 208.0 lb

## 2021-06-03 DIAGNOSIS — R0781 Pleurodynia: Secondary | ICD-10-CM | POA: Diagnosis not present

## 2021-06-03 DIAGNOSIS — M419 Scoliosis, unspecified: Secondary | ICD-10-CM

## 2021-06-03 NOTE — Progress Notes (Signed)
Office Visit Note   Patient: Vanessa Alvarez           Date of Birth: May 17, 1985           MRN: [id number] Visit Date: 06/03/2021              Requested by: [name], MD [place],  [place] [address] PCP: [name], MD   Assessment & Plan: Visit Diagnoses:  1. Scoliosis of thoracolumbar spine, unspecified scoliosis type   2. Rib pain on left side     Plan: Plan: Avoid frequent bending and stooping  No lifting greater than 10 lbs. May use ice or moist heat for pain. Weight loss is of benefit. Best medication for lumbar disc disease is arthritis medications like tylenol, CBD or hemp is a consideration.  Zostrix creame is an over the counter remedy for nerve pain but should be used for 2 weeks before a person may experience decreased nerve pain. Consider rescheduling appointment to see a physical therapist for stretching exercises and for TENS Trial. Without an adequate conservative treatment trial it would be nearly impossible to recommend surgical Solution due to the risk of surgery, either instrumentation of the thoracolumbar scoliosis or rib resection Surgery.  Exercise is important to improve your indurance and does allow people to function better inspite of back pain. A Dr. Zoe Lan shoe insert for the left shoe helps to equal the leg lengths but it will not affect the chest discomfort. People can tolerated up to 2 cm of leg length discrepancy without needing an insert or correction, The degree of leg difference in your case is 0.5cm  Follow-Up Instructions: No follow-ups on file.   Orders:  No orders of the defined types were placed in this encounter.  No orders of the defined types were placed in this encounter.     Procedures: No procedures performed   Clinical Data: No additional findings.   Subjective: Chief Complaint  Patient presents with   Other    6 weeks' follow up on left-sided rib pain -- continues to have intermittent pain. No  specific activity brings on the pain.    HPI  Review of Systems  Constitutional: Negative.   HENT: Negative.    Eyes: Negative.   Respiratory: Negative.    Cardiovascular: Negative.   Gastrointestinal: Negative.   Endocrine: Negative.   Genitourinary: Negative.   Musculoskeletal: Negative.   Skin: Negative.   Allergic/Immunologic: Negative.   Neurological: Negative.   Hematological: Negative.   Psychiatric/Behavioral: Negative.      Objective: Vital Signs: BP 129/80 (BP Location: Right Arm, Patient Position: Sitting, Cuff Size: Large)    Pulse 86    Ht 5\' 4"  (1.626 m)    Wt 208 lb (94.3 kg)    BMI 35.70 kg/m   Physical Exam Constitutional:      Appearance: She is well-developed.  HENT:     Head: Normocephalic and atraumatic.  Eyes:     Pupils: Pupils are equal, round, and reactive to light.  Pulmonary:     Effort: Pulmonary effort is normal.     Breath sounds: Normal breath sounds.  Abdominal:     General: Bowel sounds are normal.     Palpations: Abdomen is soft.  Musculoskeletal:        General: Normal range of motion.     Cervical back: Normal range of motion and neck supple.  Skin:    General: Skin is warm and dry.  Neurological:  Mental Status: She is alert and oriented to person, place, and time.  Psychiatric:        Behavior: Behavior normal.        Thought Content: Thought content normal.        Judgment: Judgment normal.   Back Exam   Tenderness  The patient is experiencing tenderness in the thoracic.  Comments:  Right thoracic scoliosis with rotational changes clinically and left thoracolumbar crease.     Specialty Comments:  No specialty comments available.  Imaging: No results found.   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   Nausea & vomiting 02/12/2015   ESRD on dialysis ([place])    Nystagmus 08/22/2013   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   Headache 07/13/2011   ECZEMA, ATOPIC 11/05/2009   Hypothyroidism 03/12/2009   Obesity, Class II, BMI 35-39.9 02/26/2009   Essential hypertension 02/26/2009   Iron deficiency anemia 05/20/2008   Anxiety state 06/23/2006   MENTAL RETARDATION 06/23/2006   Past Medical History:  Diagnosis Date   Anemia    Iron Def   Anxiety    Autism spectrum    Behavioral problems    Depression    End stage renal disease ([place])    GERD (gastroesophageal reflux disease)    Hyperlipidemia    Hypertension    HYPOTHYROIDISM 03/12/2009   Qualifier: Diagnosis of  By: Ta MD, Cat     MENTAL RETARDATION 06/23/2006   Qualifier: Diagnosis of  By: Eusebio Friendly     Mood disorder [facility])    Sees Dr Silvio Pate, who prescribes meds   Shortness of breath dyspnea    with exertion    Family History  Problem Relation Age of Onset   Hypertension Mother    Diabetes Maternal Grandmother    Hypertension Maternal Grandmother    Heart disease Maternal Grandmother    Bladder Cancer Maternal Grandmother    Diabetes Maternal Grandfather    Cancer Maternal Grandfather        prostate   Colon cancer Neg Hx    Pancreatic cancer Neg Hx    Esophageal cancer Neg Hx    Liver disease Neg Hx    Stomach cancer Neg Hx    Rectal cancer Neg Hx     Past Surgical History:  Procedure Laterality Date   AV FISTULA PLACEMENT Left 03/17/2015   Procedure: CREATION OF LEFT BRACHIOCEPHALIC ARTERIOVENOUS (AV) FISTULA ;  Surgeon: Conrad Bloomfield, MD;  Location:  [place];  Service: Vascular;  Laterality: Left;   CHOLECYSTECTOMY  11/24/2004   INSERTION OF DIALYSIS CATHETER Right 03/17/2015   Procedure: INSERTION OF DIALYSIS CATHETER RIGHT INTERNAL JUIGULAR PLACEMENT;  Surgeon: Conrad Holy Cross, MD;  [facility];  Service: Vascular;  Laterality: Right;   KIDNEY TRANSPLANT Right 11/2019   Social History   Occupational History   Occupation: disabled  Tobacco Use   Smoking status: Never   Smokeless tobacco: Never  Vaping Use   Vaping Use: Never used  Substance and  Sexual Activity   Alcohol use: No    Alcohol/week: 0.0 standard drinks   Drug use: No   Sexual activity: Never

## 2021-06-03 NOTE — Patient Instructions (Signed)
Plan: Avoid frequent bending and stooping  No lifting greater than 10 lbs. May use ice or moist heat for pain. Weight loss is of benefit. Best medication for lumbar disc disease is arthritis medications like tylenol, CBD or hemp is a consideration.  Zostrix creame is an over the counter remedy for nerve pain but should be used for 2 weeks before a person may experience decreased nerve pain. Consider rescheduling appointment to see a physical therapist for stretching exercises and for TENS Trial. Without an adequate conservative treatment trial it would be nearly impossible to recommend surgical Solution due to the risk of surgery, either instrumentation of the thoracolumbar scoliosis or rib resection Surgery.  Exercise is important to improve your indurance and does allow people to function better inspite of back pain. A Dr. Zoe Lan shoe insert for the left shoe helps to equal the leg lengths but it will not affect the chest discomfort. People can tolerated up to 2 cm of leg length discrepancy without needing an insert or correction, The degree of leg difference in your case is 0.5cm

## 2021-06-21 ENCOUNTER — Other Ambulatory Visit: Payer: Self-pay | Admitting: Family Medicine

## 2021-07-07 ENCOUNTER — Ambulatory Visit
Admission: EM | Admit: 2021-07-07 | Discharge: 2021-07-07 | Disposition: A | Discharge status: Home / Self Care | Payer: Medicare Other

## 2021-07-07 ENCOUNTER — Ambulatory Visit (INDEPENDENT_AMBULATORY_CARE_PROVIDER_SITE_OTHER): Payer: Medicare Other | Admitting: Family Medicine

## 2021-07-07 ENCOUNTER — Encounter: Payer: Self-pay | Admitting: Emergency Medicine

## 2021-07-07 ENCOUNTER — Other Ambulatory Visit: Payer: Self-pay

## 2021-07-07 ENCOUNTER — Encounter: Payer: Self-pay | Admitting: Family Medicine

## 2021-07-07 VITALS — BP 154/85 | HR 89 | Ht 64.0 in | Wt 215.4 lb

## 2021-07-07 DIAGNOSIS — Z0000 Encounter for general adult medical examination without abnormal findings: Secondary | ICD-10-CM

## 2021-07-07 DIAGNOSIS — I10 Essential (primary) hypertension: Secondary | ICD-10-CM

## 2021-07-07 DIAGNOSIS — M25562 Pain in left knee: Secondary | ICD-10-CM

## 2021-07-07 DIAGNOSIS — M25561 Pain in right knee: Secondary | ICD-10-CM

## 2021-07-07 MED ORDER — TETANUS-DIPHTH-ACELL PERTUSSIS 5-2.5-18.5 LF-MCG/0.5 IM SUSP: 0.5000 mL | Freq: Once | INTRAMUSCULAR | 0 refills | Status: AC

## 2021-07-07 MED ORDER — CARVEDILOL 12.5 MG PO TABS: 12.5000 mg | ORAL_TABLET | Freq: Two times a day (BID) | ORAL | 2 refills | Status: DC

## 2021-07-07 MED ORDER — DICLOFENAC SODIUM 1 % EX GEL: 4.0000 g | Freq: Four times a day (QID) | CUTANEOUS | Status: AC | PRN

## 2021-07-07 NOTE — ED Triage Notes (Addendum)
Noticed her BP was high this afternoon. Took her BP pill (amlodipine), and then also gave her her new BP pill prescribed today (carvedilol) before coming here today. Denies headache, blurred vision, flushing, palpitations, chest pain, shortness of breath. Hx of kidney transplant. ?

## 2021-07-07 NOTE — Progress Notes (Signed)
? ? ?  SUBJECTIVE:  ? ?CHIEF COMPLAINT / HPI:  ?Chief Complaint  ?Patient presents with  ? Annual Exam  ? Knee Pain  ?  ?Patient reports intermittent bilateral knee pain ongoing for about 3 weeks which is worse with ambulation.  She sometimes feels a popping sensation.  She has not taking any medications for this.  She has no knee pain currently.  She has not had any injuries to the knee. ? ?Home readings 140s/80s-90s mostly.  ? ?PERTINENT  PMH / PSH: Renal transplant on immunosuppression, HTN, scoliosis, hypothyroidism ? ?Patient Care Team: ?[name], MD as PCP - General (Family Medicine) ?Donato Heinz, MD as Consulting Physician (Nephrology)  ? ?OBJECTIVE:  ? ?BP (!) 154/85   Pulse 89   Ht 5\' 4"  (1.626 m)   Wt 215 lb 6.4 oz (97.7 kg)   LMP  (LMP Unknown)   SpO2 99%   BMI 36.97 kg/m?   ?Physical Exam ?Constitutional:   ?   General: She is not in acute distress. ?   Appearance: Normal appearance. She is obese.  ?HENT:  ?   Head: Normocephalic and atraumatic.  ?Cardiovascular:  ?   Rate and Rhythm: Normal rate and regular rhythm.  ?Pulmonary:  ?   Effort: Pulmonary effort is normal. No respiratory distress.  ?   Breath sounds: Normal breath sounds.  ?Musculoskeletal:  ?   Cervical back: Neck supple.  ?   Comments: No obvious deformity or swelling to bilateral knees.  There is mild tenderness to palpation at the medial joint line on the left knee.  Full ROM without pain.  Full strength with flexion and extension.  Patient declined special testing.  ?Neurological:  ?   Mental Status: She is alert.  ?  ? ?Depression screen [facility] 2/9 07/07/2021  ?Decreased Interest 0  ?Down, Depressed, Hopeless 0  ?PHQ - 2 Score 0  ?Altered sleeping 1  ?Tired, decreased energy 1  ?Change in appetite 0  ?Feeling bad or failure about yourself  0  ?Trouble concentrating 0  ?Moving slowly or fidgety/restless 0  ?Suicidal thoughts 0  ?PHQ-9 Score 2  ?Difficult doing work/chores -  ?Some recent data might be hidden  ?  ? ?{Show  previous vital signs (optional):23777} ? ? ? ?ASSESSMENT/PLAN:  ? ?Essential hypertension ?Elevated in office and with home readings.  Will transition metoprolol to carvedilol 12.5 mg twice daily.  Continue amlodipine 10 mg. ?  ?Acute bilateral knee pain ?Atraumatic, prior x-ray imaging reveals mild arthritis.  Suspect weight is contributing. ?- diclofenac gel ?- knee exercises ?- consider repeat imaging if not improved in the next 3-4 weeks ? ?HCM ?- Tdap vaccine ordered to pharmacy ? ?Return in about 2 weeks (around 07/21/2021) for f/u HTN.  ? ?[name], MD ?[place]  ?

## 2021-07-07 NOTE — Patient Instructions (Addendum)
It was nice seeing you today! ? ?Try Voltaren gel up to 4 times a day. ? ?Work on doing knee exercises to strengthen the muscles around the knee. ? ?Stop the metoprolol. ? ?Start carvedilol 12.5 mg twice a day. ? ?Get your tetanus shot at your pharmacy. ? ?Schedule your annual Medicare wellness exam at the front desk  before you leave today. ? ?Stay well, ?[name], MD ?[place] ?[phone] ? ?-- ? ?Make sure to check out at the front desk before you leave today. ? ?Please arrive at least 15 minutes prior to your scheduled appointments. ? ?If you had blood work today, I will send you a MyChart message or a letter if results are normal. Otherwise, I will give you a call. ? ?If you had a referral placed, they will call you to set up an appointment. Please give [place] a call if you don't hear back in the next 2 weeks. ? ?If you need additional refills before your next appointment, please call your pharmacy first.  ?

## 2021-07-07 NOTE — Assessment & Plan Note (Signed)
Elevated in office and with home readings.  Will transition metoprolol to carvedilol 12.5 mg twice daily.  Continue amlodipine 10 mg. ?

## 2021-07-07 NOTE — ED Provider Notes (Signed)
?[place] [facility] ? ? ? ?CSN: [id number] ?Arrival date & time: 07/07/21  1907 ? ? ?  ? ?History   ?Chief Complaint ?Chief Complaint  ?Patient presents with  ? Hypertension  ? ? ?HPI ?Vanessa Alvarez is a 36 y.o. female.  ? ?Patient here today for evaluation of elevated blood pressure that she noticed this afternoon.  She states she took her blood pressure medicine (amlodipine) and then also her carvedilol that she was prescribed today.  In office blood pressure has improved.  She denies any headache, blurred vision, chest pain or shortness of breath.  She does have history of kidney transplant. ? ?The history is provided by the patient.  ? ?Past Medical History:  ?Diagnosis Date  ? Anemia   ? Iron Def  ? Anxiety   ? Autism spectrum   ? Behavioral problems   ? Depression   ? End stage renal disease ([place])   ? ESRD on dialysis [facility])   ? ESRD on HD dx 2016. Sees Dr Marval Regal with [facility].  Tues, Thurs, Sat schedule.    ? GERD (gastroesophageal reflux disease)   ? Hyperlipidemia   ? Hypertension   ? HYPOTHYROIDISM 03/12/2009  ? Qualifier: Diagnosis of  By: Ta MD, Cat    ? MENTAL RETARDATION 06/23/2006  ? Qualifier: Diagnosis of  By: Eusebio Friendly    ? Mood disorder ([place])   ? Sees Dr Silvio Pate, who prescribes meds  ? Neck pain 05/04/2017  ? Rectal bleeding 03/04/2015  ? Shortness of breath dyspnea   ? with exertion  ? ? ?Patient Active Problem List  ? Diagnosis Date Noted  ? Right hip pain 02/07/2020  ? Deceased-donor kidney transplant 12/03/2019  ? Immunosuppression ([place]) 12/03/2019  ? Left-sided chest pain 08/23/2019  ? Chronic idiopathic constipation 06/15/2019  ? Intellectual disability 02/01/2018  ? GERD (gastroesophageal reflux disease) 05/04/2017  ? Paranoia ([place]) 10/16/2015  ? Hyperphosphatemia 07/11/2015  ? 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) 07/06/2015  ? Coagulation defect, unspecified ([place]) 03/18/2015  ? Generalized anxiety disorder 03/18/2015  ? 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  Nystagmus 08/22/2013  ? 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  ? Headache 07/13/2011  ? ECZEMA, ATOPIC 11/05/2009  ? Hypothyroidism 03/12/2009  ? Obesity, Class II, BMI 35-39.9 02/26/2009  ? Essential hypertension 02/26/2009  ? Iron deficiency anemia 05/20/2008  ? Anxiety state 06/23/2006  ? MENTAL RETARDATION 06/23/2006  ? ? ?Past Surgical History:  ?Procedure Laterality Date  ? AV FISTULA PLACEMENT Left 03/17/2015  ? Procedure: CREATION OF LEFT BRACHIOCEPHALIC ARTERIOVENOUS (AV) FISTULA ;  Surgeon: Conrad Easton, MD;  Location: [place];  Service: Vascular;  Laterality: Left;  ? CHOLECYSTECTOMY  11/24/2004  ? INSERTION OF DIALYSIS CATHETER Right 03/17/2015  ? Procedure: INSERTION OF DIALYSIS CATHETER RIGHT INTERNAL JUIGULAR PLACEMENT;  Surgeon: Conrad [place], MD;  Location: [place];  Service: Vascular;  Laterality: Right;  ? KIDNEY TRANSPLANT Right 11/2019  ? ? ?OB History   ? ? Gravida  ?0  ? Para  ?0  ? Term  ?0  ? Preterm  ?0  ? AB  ?0  ? Living  ?0  ?  ? ? SAB  ?0  ? IAB  ?0  ? Ectopic  ?0  ? Multiple  ?0  ? Live Births  ?0  ?   ?  ?  ? ? ? ?Home Medications   ? ?Prior to Admission medications   ?Medication Sig Start Date End Date Taking? Authorizing Provider  ?amLODipine (NORVASC)  10 MG tablet Take 1 tablet by mouth daily. 10/29/20   [provider]  ?aripiprazole (ABILIFY) 10 MG disintegrating tablet Take 10 mg by mouth at bedtime.    [provider]  ?aspirin EC 81 MG tablet Take 81 mg by mouth daily. Swallow whole.    [provider]  ?busPIRone (BUSPAR) 10 MG tablet Take 10 mg by mouth 3 (three) times daily.    [provider]  ?carvedilol (COREG) 12.5 MG tablet Take 1 tablet (12.5 mg total) by mouth 2 (two) times daily with a meal. 07/07/21   [name], MD  ?ferrous sulfate 325 (65 FE) MG tablet Take 1 tablet (325 mg total) by mouth 2 (two) times daily. 01/07/12   Clovis Cao, MD  ?hydrOXYzine (ATARAX/VISTARIL) 25 MG tablet Take 25 mg by mouth 3 (three) times daily as needed.     [provider]  ?lamoTRIgine (LAMICTAL) 200 MG tablet Take 100 mg by mouth every morning. Takes 1 tablet at night and 1/2 tablet in the AM    [provider]  ?levothyroxine (SYNTHROID) 112 MCG tablet TAKE 1 TABLET BY MOUTH EVERY DAY BEFORE BREAKFAST 05/07/21   [name], MD  ?mycophenolate (MYFORTIC) 180 MG EC tablet Take 180 mg by mouth See admin instructions. Take 3 tablets in the morning and 3 tablets in the evening    [provider]  ?omeprazole (PRILOSEC) 20 MG capsule TAKE 1 CAPSULE (20 MG TOTAL) BY MOUTH 2 (TWO) TIMES DAILY BEFORE A MEAL. 05/04/21   Esterwood, Amy S, PA-C  ?polyethylene glycol powder (GLYCOLAX/MIRALAX) 17 GM/SCOOP powder Take 17 g by mouth daily as needed for mild constipation. Reported on 05/26/2015 06/15/19   Meccariello, Bernita Raisin, DO  ?predniSONE (DELTASONE) 5 MG tablet Take 5 mg by mouth daily.    [provider]  ?sennosides-docusate sodium (SENOKOT-S) 8.6-50 MG tablet Take 1 tablet by mouth in the morning and at bedtime.    [provider]  ?sertraline (ZOLOFT) 100 MG tablet Take 100 mg by mouth daily.     [provider]  ?tacrolimus (PROGRAF) 1 MG capsule Take 3 mg by mouth daily.    [provider]  ?tacrolimus ER (ENVARSUS XR) 4 MG TB24 Take 4 mg by mouth daily before breakfast.    [provider]  ?Tdap (BOOSTRIX) 5-2.5-18.5 LF-MCG/0.5 injection Inject 0.5 mLs into the muscle once for 1 dose. 07/07/21 07/07/21  [name], MD  ? ? ?Family History ?Family History  ?Problem Relation Age of Onset  ? Hypertension Mother   ? Diabetes Maternal Grandmother   ? Hypertension Maternal Grandmother   ? Heart disease Maternal Grandmother   ? Bladder Cancer Maternal Grandmother   ? Diabetes Maternal Grandfather   ? Cancer Maternal Grandfather   ?     prostate  ? Colon cancer Neg Hx   ? Pancreatic cancer Neg Hx   ? Esophageal cancer Neg Hx   ? Liver disease Neg Hx   ? Stomach cancer Neg Hx   ? Rectal cancer Neg Hx    ? ? ?Social History ?Social History  ? ?Tobacco Use  ? Smoking status: Never  ? Smokeless tobacco: Never  ?Vaping Use  ? Vaping Use: Never used  ?Substance Use Topics  ? Alcohol use: No  ?  Alcohol/week: 0.0 standard drinks  ? Drug use: No  ? ? ? ?Allergies   ?Patient has no known allergies. ? ? ?Review of Systems ?Review of Systems  ?Constitutional:  Negative for  chills and fever.  ?Eyes:  Negative for discharge, redness and visual disturbance.  ?Respiratory:  Negative for chest tightness and shortness of breath.   ?Cardiovascular:  Negative for chest pain.  ?Gastrointestinal:  Negative for abdominal pain, nausea and vomiting.  ?Neurological:  Negative for headaches.  ? ? ?Physical Exam ?Triage Vital Signs ?ED Triage Vitals  ?Enc Vitals Group  ?   BP   ?   Pulse   ?   Resp   ?   Temp   ?   Temp src   ?   SpO2   ?   Weight   ?   Height   ?   Head Circumference   ?   Peak Flow   ?   Pain Score   ?   Pain Loc   ?   Pain Edu?   ?   Excl. in [place]?   ? ?No data found. ? ?Updated Vital Signs ?BP (!) 142/85 (BP Location: Right Arm)   Pulse 99   Temp 98.2 ?F (36.8 ?C) (Oral)   Resp 16   LMP  (LMP Unknown)   SpO2 98%  ?   ? ?Physical Exam ?Vitals and nursing note reviewed.  ?Constitutional:   ?   General: She is not in acute distress. ?   Appearance: Normal appearance. She is not ill-appearing.  ?HENT:  ?   Head: Normocephalic and atraumatic.  ?Eyes:  ?   Conjunctiva/sclera: Conjunctivae normal.  ?Cardiovascular:  ?   Rate and Rhythm: Normal rate.  ?Pulmonary:  ?   Effort: Pulmonary effort is normal.  ?Neurological:  ?   Mental Status: She is alert.  ?Psychiatric:     ?   Mood and Affect: Mood normal.     ?   Behavior: Behavior normal.     ?   Thought Content: Thought content normal.  ? ? ? ?UC Treatments / Results  ?Labs ?(all labs ordered are listed, but only abnormal results are displayed) ?Labs Reviewed - No data to display ? ?EKG ? ? ?Radiology ?No results found. ? ?Procedures ?Procedures (including critical care  time) ? ?Medications Ordered in UC ?Medications - No data to display ? ?Initial Impression / Assessment and Plan / UC Course  ?I have reviewed the triage vital signs and the nursing notes. ? ?Pertinent labs & imaging resu

## 2021-07-21 ENCOUNTER — Ambulatory Visit: Payer: Medicare Other | Admitting: Family Medicine

## 2021-09-25 ENCOUNTER — Other Ambulatory Visit: Payer: Self-pay | Admitting: Family Medicine

## 2021-09-29 ENCOUNTER — Encounter: Payer: Self-pay | Admitting: *Deleted

## 2021-10-23 ENCOUNTER — Other Ambulatory Visit: Payer: Self-pay | Admitting: Family Medicine

## 2021-10-23 DIAGNOSIS — E039 Hypothyroidism, unspecified: Secondary | ICD-10-CM

## 2021-11-27 ENCOUNTER — Other Ambulatory Visit: Payer: Self-pay | Admitting: Physician Assistant

## 2021-11-30 ENCOUNTER — Telehealth: Payer: Self-pay

## 2021-11-30 NOTE — Telephone Encounter (Signed)
Patient calls nurse line to schedule appointment for intermittent chest pain and SHOB. Patient reports that this has been going on for over one year and would like to have follow up evaluation. Patient was seen on 11/13/2020 for this same concern.   Denies current chest pain or SHOB.   Scheduled patient appointment with Dr. Marcina Millard for Monday, 8/14. PCP has no availability until September.   Strict ED precautions discussed. Patient verbalizes understanding.   Talbot Grumbling, RN

## 2021-12-04 NOTE — Telephone Encounter (Signed)
Called patient to reschedule appointment due to schedule change.   Rescheduled for Monday, 8/21. Not currently having symptoms and provided with ED precautions.   Talbot Grumbling, RN

## 2021-12-07 ENCOUNTER — Ambulatory Visit: Payer: Medicare Other | Admitting: Student

## 2021-12-14 ENCOUNTER — Encounter: Payer: Self-pay | Admitting: Family Medicine

## 2021-12-14 ENCOUNTER — Ambulatory Visit (INDEPENDENT_AMBULATORY_CARE_PROVIDER_SITE_OTHER): Payer: Medicare Other | Admitting: Family Medicine

## 2021-12-14 VITALS — BP 126/68 | HR 88 | Ht 64.0 in | Wt 218.0 lb

## 2021-12-14 DIAGNOSIS — K219 Gastro-esophageal reflux disease without esophagitis: Secondary | ICD-10-CM | POA: Diagnosis present

## 2021-12-14 DIAGNOSIS — R0789 Other chest pain: Secondary | ICD-10-CM | POA: Diagnosis not present

## 2021-12-14 DIAGNOSIS — R079 Chest pain, unspecified: Secondary | ICD-10-CM | POA: Diagnosis not present

## 2021-12-14 DIAGNOSIS — K449 Diaphragmatic hernia without obstruction or gangrene: Secondary | ICD-10-CM

## 2021-12-14 NOTE — Patient Instructions (Signed)
It was great to see you today! Thank you for choosing [facility] for your primary care. Vanessa Alvarez was seen for chest pain.  Today we addressed: Chest pain - given your symptoms I am reassured that this pain is most likely not related to your heart. I believe it could be from your hiatal hernia or another GI cause. I have sent in for a GI referral.   If you haven't already, sign up for My Chart to have easy access to your labs results, and communication with your primary care physician.  We are checking some labs today. If they are abnormal, I will call you. If they are normal, I will send you a MyChart message (if it is active) or a letter in the mail. If you do not hear about your labs in the next 2 weeks, please call the office.   You should return to our clinic No follow-ups on file.  I recommend that you always bring your medications to each appointment as this makes it easy to ensure you are on the correct medications and helps [place] not miss refills when you need them.  Please arrive 15 minutes before your appointment to ensure smooth check in process.  We appreciate your efforts in making this happen.  Please call the clinic at [phone] if your symptoms worsen or you have any concerns.  Thank you for allowing me to participate in your care, Lowry Ram, MD 12/14/2021, 4:02 PM PGY-1, [place]

## 2021-12-14 NOTE — Progress Notes (Signed)
    SUBJECTIVE:   CHIEF COMPLAINT / HPI: Chest pain, non acute  Intermittent dull chest pain for the past 6 months. Usually lasts a couple of seconds to minutes. Episodes happen about everyday. Starts in your upper sternum/left chest chest moves to epigastric area and then LUQ. Sometimes associated with shortness of breath which resolves when pain resolves. Denies palpations or fast heart rate. Denies associated nausea or vomiting. Denies lightheadedness with chest pain. Denies burning sensation. Pain not affected by eating better or worse. Pain not worsened by activity.   PERTINENT  PMH / PSH: Hypertension, s/p kidney transplant, hypothyroid  OBJECTIVE:   BP 126/68   Pulse 88   Ht 5\' 4"  (1.626 m)   Wt 218 lb (98.9 kg)   LMP 11/20/2021 (Exact Date)   SpO2 100%   BMI 37.42 kg/m   General: well appearing, intellectually delayed pleasant woman  CV: RRR, no murmurs, rubs, or gallops, no exertional chest pain, no pain on palpation  Resp: CTAB, no increased work of breathing  Abd: no tenderness to palpation, soft, no distension  Ext - trace edema to calves   ASSESSMENT/PLAN:   Left-sided chest pain Most likely atypical in nature. Reassuring against cardiac cause as she denies associated symptoms, worsening with exertion, or classic presentation. Left sided sternal however lasts seconds to minutes and migrates to epigastric region and left upper quadrant. ECHO and Stress test from one year ago showed no cardiac issues except for mild cardiomegaly. EGD showed small hiatal hernia.  - Refer to GI for evaluation of hiatal hernia or other cause of atypical chest pain.      Lowry Ram, MD [place]

## 2021-12-14 NOTE — Assessment & Plan Note (Signed)
Most likely atypical in nature. Reassuring against cardiac cause as she denies associated symptoms, worsening with exertion, or classic presentation. Left sided sternal however lasts seconds to minutes and migrates to epigastric region and left upper quadrant. ECHO and Stress test from one year ago showed no cardiac issues except for mild cardiomegaly. EGD showed small hiatal hernia.  - Refer to GI for evaluation of hiatal hernia or other cause of atypical chest pain.

## 2021-12-14 NOTE — Assessment & Plan Note (Signed)
>>  ASSESSMENT AND PLAN FOR LEFT-SIDED CHEST PAIN WRITTEN ON 12/14/2021  5:05 PM BY Lockie Mola, MD  Most likely atypical in nature. Reassuring against cardiac cause as she denies associated symptoms, worsening with exertion, or classic presentation. Left sided sternal however lasts seconds to minutes and migrates to epigastric region and left upper quadrant. ECHO and Stress test from one year ago showed no cardiac issues except for mild cardiomegaly. EGD showed small hiatal hernia.  - Refer to GI for evaluation of hiatal hernia or other cause of atypical chest pain.

## 2021-12-26 ENCOUNTER — Other Ambulatory Visit: Payer: Self-pay | Admitting: Family Medicine

## 2022-01-06 ENCOUNTER — Ambulatory Visit
Admission: EM | Admit: 2022-01-06 | Discharge: 2022-01-06 | Disposition: A | Discharge status: Home / Self Care | Payer: Medicare Other | Attending: Internal Medicine | Admitting: Internal Medicine

## 2022-01-06 ENCOUNTER — Telehealth: Payer: Self-pay | Admitting: Family Medicine

## 2022-01-06 DIAGNOSIS — Z20822 Contact with and (suspected) exposure to covid-19: Secondary | ICD-10-CM | POA: Diagnosis not present

## 2022-01-06 DIAGNOSIS — J069 Acute upper respiratory infection, unspecified: Secondary | ICD-10-CM | POA: Insufficient documentation

## 2022-01-06 LAB — SARS CORONAVIRUS 2 BY RT PCR: SARS Coronavirus 2 by RT PCR: POSITIVE — AB

## 2022-01-06 MED ORDER — FLUTICASONE PROPIONATE 50 MCG/ACT NA SUSP: 1.0000 | Freq: Every day | NASAL | 0 refills | Status: DC

## 2022-01-06 MED ORDER — BENZONATATE 100 MG PO CAPS: 100.0000 mg | ORAL_CAPSULE | Freq: Three times a day (TID) | ORAL | 0 refills | Status: DC | PRN

## 2022-01-06 NOTE — Discharge Instructions (Signed)
You have some sort of viral upper respiratory infection that should run its course and self resolve with symptomatic treatment.  You have been sent two medications to help alleviate symptoms.  COVID test is pending.  We will call if it is positive.  Please go to the emergency department if any symptoms persist or worsen as we discussed.

## 2022-01-06 NOTE — ED Provider Notes (Signed)
[facility]    CSN: [id number] Arrival date & time: 01/06/22  0906      History   Chief Complaint Chief Complaint  Patient presents with   Cough    HPI KATERYN MARASIGAN is a 36 y.o. female.   Patient presents with nasal congestion, cough, fever that started about 3 days ago.  Patient reports that she was around an infant that had similar symptoms.  Tmax at home was 100.  Patient has had Tylenol for symptoms.  Patient denies sore throat, ear pain, chest pain, shortness of breath, nausea, vomiting, diarrhea, abdominal pain.  Patient is present with caregiver who provides majority of history.  Pertinent medical history includes history of kidney disease with renal transplant.   Cough   Past Medical History:  Diagnosis Date   Anemia    Iron Def   Anxiety    Autism spectrum    Behavioral problems    Depression    End stage renal disease ([place])    ESRD on dialysis ([place])    ESRD on HD dx 2016. Sees Dr Marval Regal with [facility] [facility]  Tues, Thurs, Sat schedule.     GERD (gastroesophageal reflux disease)    Hyperlipidemia    Hypertension    HYPOTHYROIDISM 03/12/2009   Qualifier: Diagnosis of  By: Ta MD, Cat     MENTAL RETARDATION 06/23/2006   Qualifier: Diagnosis of  By: Eusebio Friendly     Mood disorder [facility])    Sees Dr Silvio Pate, who prescribes meds   Neck pain 05/04/2017   Rectal bleeding 03/04/2015   Shortness of breath dyspnea    with exertion    Patient Active Problem List   Diagnosis Date Noted   Right hip pain 2020-02-12   Deceased-donor kidney transplant 12/03/2019   Immunosuppression ([place]) 12/03/2019   Left-sided chest pain 08/23/2019   Chronic idiopathic constipation 06/15/2019   Intellectual disability 02/01/2018   GERD (gastroesophageal reflux disease) 05/04/2017   Paranoia ([place]) 10/16/2015   Hyperphosphatemia 07/11/2015   Affective disorder ([place]) 07/06/2015   Coagulation defect, unspecified ([place]) 03/18/2015   Generalized anxiety  disorder 03/18/2015   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   Nystagmus 08/22/2013   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   Headache 07/13/2011   ECZEMA, ATOPIC 11/05/2009   Hypothyroidism 03/12/2009   Obesity, Class II, BMI 35-39.9 02/26/2009   Essential hypertension 02/26/2009   Iron deficiency anemia 05/20/2008   Anxiety state 06/23/2006   MENTAL RETARDATION 06/23/2006    Past Surgical History:  Procedure Laterality Date   AV FISTULA PLACEMENT Left 03/17/2015   Procedure: CREATION OF LEFT BRACHIOCEPHALIC ARTERIOVENOUS (AV) FISTULA ;  Surgeon: Conrad Jobos, MD;  Location: [facility];  Service: Vascular;  Laterality: Left;   CHOLECYSTECTOMY  11/24/2004   INSERTION OF DIALYSIS CATHETER Right 03/17/2015   Procedure: INSERTION OF DIALYSIS CATHETER RIGHT INTERNAL JUIGULAR PLACEMENT;  Surgeon: Conrad Augusta, MD;  Location: [facility];  Service: Vascular;  Laterality: Right;   KIDNEY TRANSPLANT Right 11/2019    OB History     Gravida  0   Para  0   Term  0   Preterm  0   AB  0   Living  0      SAB  0   IAB  0   Ectopic  0   Multiple  0   Live Births  0            Home Medications    Prior to Admission medications  Medication Sig Start Date End Date Taking? Authorizing Provider  benzonatate (TESSALON) 100 MG capsule Take 1 capsule (100 mg total) by mouth every 8 (eight) hours as needed for cough. 01/06/22  Yes , Michele Rockers, FNP  fluticasone (FLONASE) 50 MCG/ACT nasal spray Place 1 spray into both nostrils daily for 3 days. 01/06/22 01/09/22 Yes , Michele Rockers, FNP  amLODipine (NORVASC) 10 MG tablet Take 1 tablet by mouth daily. 10/29/20   [provider]  aripiprazole (ABILIFY) 10 MG disintegrating tablet Take 10 mg by mouth at bedtime.    [provider]  aspirin EC 81 MG tablet Take 81 mg by mouth daily. Swallow whole.    [provider]  busPIRone (BUSPAR) 10 MG tablet Take 10 mg by mouth 3 (three) times daily.    [provider]  carvedilol (COREG) 12.5 MG tablet TAKE 1 TABLET (12.5MG  TOTAL) BY MOUTH TWICE A DAY WITH MEALS 12/30/21   [name], MD  ferrous sulfate 325 (65 FE) MG tablet Take 1 tablet (325 mg total) by mouth 2 (two) times daily. 01/07/12   Clovis Cao, MD  hydrOXYzine (ATARAX/VISTARIL) 25 MG tablet Take 25 mg by mouth 3 (three) times daily as needed.    [provider]  lamoTRIgine (LAMICTAL) 200 MG tablet Take 100 mg by mouth every morning. Takes 1 tablet at night and 1/2 tablet in the AM    [provider]  levothyroxine (SYNTHROID) 112 MCG tablet TAKE 1 TABLET BY MOUTH EVERY DAY BEFORE BREAKFAST 10/23/21   [name], MD  mycophenolate (MYFORTIC) 180 MG EC tablet Take 180 mg by mouth See admin instructions. Take 3 tablets in the morning and 3 tablets in the evening    [provider]  omeprazole (PRILOSEC) 20 MG capsule Take 1 capsule (20 mg total) by mouth 2 (two) times daily before a meal. Please schedule a follow up appointment for further refills. Thank you 11/27/21   Armbruster, Carlota Raspberry, MD  polyethylene glycol powder (GLYCOLAX/MIRALAX) 17 GM/SCOOP powder Take 17 g by mouth daily as needed for mild constipation. Reported on 05/26/2015 06/15/19   Meccariello, Bernita Raisin, MD  predniSONE (DELTASONE) 5 MG tablet Take 5 mg by mouth daily.    [provider]  sennosides-docusate sodium (SENOKOT-S) 8.6-50 MG tablet Take 1 tablet by mouth in the morning and at bedtime.    [provider]  sertraline (ZOLOFT) 100 MG tablet Take 100 mg by mouth daily.     [provider]  tacrolimus (PROGRAF) 1 MG capsule Take 3 mg by mouth daily. Patient not taking: Reported on 12/14/2021    [provider]  tacrolimus ER (ENVARSUS XR) 4 MG TB24 Take 4 mg by mouth daily before breakfast.    [provider]    Family History Family History  Problem Relation Age of Onset   Hypertension Mother    Diabetes Maternal Grandmother    Hypertension Maternal  Grandmother    Heart disease Maternal Grandmother    Bladder Cancer Maternal Grandmother    Diabetes Maternal Grandfather    Cancer Maternal Grandfather        prostate   Colon cancer Neg Hx    Pancreatic cancer Neg Hx    Esophageal cancer Neg Hx    Liver disease Neg Hx    Stomach cancer Neg Hx    Rectal cancer Neg Hx     Social History Social History   Tobacco Use   Smoking status: Never   Smokeless  tobacco: Never  Vaping Use   Vaping Use: Never used  Substance Use Topics   Alcohol use: No    Alcohol/week: 0.0 standard drinks of alcohol   Drug use: No     Allergies   Patient has no known allergies.   Review of Systems Review of Systems Per HPI  Physical Exam Triage Vital Signs ED Triage Vitals [01/06/22 0947]  Enc Vitals Group     BP 119/79     Pulse Rate 96     Resp 18     Temp 99.1 F (37.3 C)     Temp Source Oral     SpO2 95 %     Weight      Height      Head Circumference      Peak Flow      Pain Score 0     Pain Loc      Pain Edu?      Excl. in [place]?    No data found.  Updated Vital Signs BP 119/79 (BP Location: Right Arm)   Pulse 96   Temp 99.1 F (37.3 C) (Oral)   Resp 18   LMP 11/20/2021 (Exact Date)   SpO2 95%   Visual Acuity Right Eye Distance:   Left Eye Distance:   Bilateral Distance:    Right Eye Near:   Left Eye Near:    Bilateral Near:     Physical Exam Constitutional:      General: She is not in acute distress.    Appearance: Normal appearance. She is not toxic-appearing or diaphoretic.  HENT:     Head: Normocephalic and atraumatic.     Right Ear: Tympanic membrane and ear canal normal.     Left Ear: Tympanic membrane and ear canal normal.     Nose: Congestion and rhinorrhea present. Rhinorrhea is clear.     Mouth/Throat:     Mouth: Mucous membranes are moist.     Pharynx: No posterior oropharyngeal erythema.  Eyes:     Extraocular Movements: Extraocular movements intact.     Conjunctiva/sclera: Conjunctivae  normal.     Pupils: Pupils are equal, round, and reactive to light.  Cardiovascular:     Rate and Rhythm: Normal rate and regular rhythm.     Pulses: Normal pulses.     Heart sounds: Normal heart sounds.  Pulmonary:     Effort: Pulmonary effort is normal. No respiratory distress.     Breath sounds: Normal breath sounds. No stridor. No wheezing, rhonchi or rales.  Abdominal:     General: Abdomen is flat. Bowel sounds are normal.     Palpations: Abdomen is soft.  Musculoskeletal:        General: Normal range of motion.     Cervical back: Normal range of motion.  Skin:    General: Skin is warm and dry.  Neurological:     General: No focal deficit present.     Mental Status: She is alert and oriented to person, place, and time. Mental status is at baseline.  Psychiatric:        Mood and Affect: Mood normal.        Behavior: Behavior normal.      UC Treatments / Results  Labs (all labs ordered are listed, but only abnormal results are displayed) Labs Reviewed  SARS CORONAVIRUS 2 BY RT PCR    EKG   Radiology No results found.  Procedures Procedures (including critical care time)  Medications Ordered in UC Medications -  No data to display  Initial Impression / Assessment and Plan / UC Course  I have reviewed the triage vital signs and the nursing notes.  Pertinent labs & imaging results that were available during my care of the patient were reviewed by me and considered in my medical decision making (see chart for details).     Patient presents with symptoms likely from a viral upper respiratory infection. Differential includes bacterial pneumonia, sinusitis, allergic rhinitis, COVID-19, flu. Do not suspect underlying cardiopulmonary process. Symptoms seem unlikely related to ACS, CHF or COPD exacerbations, pneumonia, pneumothorax. Patient is nontoxic appearing and not in need of emergent medical intervention.  COVID test pending.  Patient had 2 COVID tests at home that  were negative so low suspicion for COVID but will confirm with PCR given patient's history of comorbidities.  Low concern that fevers are related to complications of renal transplant given patient's associated upper respiratory symptoms.   Recommended symptom control with over the counter medications that are safe for patient and supportive care.  Discussed with caregiver and patient.  Patient sent prescriptions to help alleviate symptoms.  Fever monitoring and management discussed with patient and caregiver.  Return if symptoms fail to improve. Patient and caregiver state understanding and are agreeable.  Strict return and ER precautions given.  Discharged with PCP followup.  Final Clinical Impressions(s) / UC Diagnoses   Final diagnoses:  Viral upper respiratory tract infection with cough     Discharge Instructions      You have some sort of viral upper respiratory infection that should run its course and self resolve with symptomatic treatment.  You have been sent two medications to help alleviate symptoms.  COVID test is pending.  We will call if it is positive.  Please go to the emergency department if any symptoms persist or worsen as we discussed.    ED Prescriptions     Medication Sig Dispense Auth. Provider   fluticasone (FLONASE) 50 MCG/ACT nasal spray Place 1 spray into both nostrils daily for 3 days. 16 g , Hildred Alamin E, [place]   benzonatate (TESSALON) 100 MG capsule Take 1 capsule (100 mg total) by mouth every 8 (eight) hours as needed for cough. 21 capsule [place], Michele Rockers, [place]      PDMP not reviewed this encounter.   Teodora Medici, [place] 01/06/22 1008

## 2022-01-06 NOTE — Telephone Encounter (Signed)
After hours emergency line   Mom calls on behalf of patient. States patient had a Temp 100.1 after receiving Tylenol at 11pm and has been coughing a lot. Is a kidney transplant so just wanted to make sure she did not need to be seen in the ED. Coughing started about 3 days ago. Does endorse throat pain and rhinorrhea.No SOB or chest pain. Is able to eat and drink. No urinary symptoms. COVID test at home negative. Discussed symptomatic management. Attempted to book a clinic appointment but could not find anything available. Advised to call in the morning when clinic opens to check for cancellations. ED precautions provided.

## 2022-01-06 NOTE — ED Triage Notes (Signed)
Pt c/o cough, fever at home 101F tylenol, nausea   Denies sore throat, otalgia, headache,   Onset ~ 3 days ago   Denies pain

## 2022-01-27 ENCOUNTER — Other Ambulatory Visit: Payer: Self-pay | Admitting: Gastroenterology

## 2022-03-11 ENCOUNTER — Ambulatory Visit (INDEPENDENT_AMBULATORY_CARE_PROVIDER_SITE_OTHER): Payer: Medicare Other | Admitting: Student

## 2022-03-11 VITALS — BP 128/80 | HR 87 | Ht 64.0 in | Wt 220.0 lb

## 2022-03-11 DIAGNOSIS — Z23 Encounter for immunization: Secondary | ICD-10-CM

## 2022-03-11 DIAGNOSIS — I770 Arteriovenous fistula, acquired: Secondary | ICD-10-CM

## 2022-03-11 DIAGNOSIS — M542 Cervicalgia: Secondary | ICD-10-CM | POA: Diagnosis present

## 2022-03-11 MED ORDER — DICLOFENAC SODIUM 1 % EX GEL: 4.0000 g | Freq: Four times a day (QID) | CUTANEOUS | 0 refills | Status: AC

## 2022-03-11 NOTE — Progress Notes (Signed)
    SUBJECTIVE:   CHIEF COMPLAINT / HPI: Neck pain  Neck has been hurting since yesterday per patient however mom says that for the past week she has been complaining of this. Patient says she has felt like this before and feels similar to episodes in the past. She describes it as an aching pain. She says it comes and goes throughout the day. Worse when laying head back onto pillow. Denies any arm weakness. Does say sometimes has had a shooting pain down her right arm and mom mention that sometimes she says the left arm near fistula has pain. Mom would like referral to vascular surgery to discuss pain near fistula site as well. Denies any fevers or skin irritation. No OTC medications she has been taking. No issues with gait/weakness.   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, intellectual disability, scoliosis  OBJECTIVE:   BP 128/80   Pulse 87   Ht 5\' 4"  (1.626 m)   Wt 220 lb (99.8 kg)   LMP 03/04/2022   SpO2 98%   BMI 37.76 kg/m   General: Well appearing, NAD, awake, alert, responsive to questions Head/Neck: Normocephalic atraumatic, supple, full ROM of neck without pain, negative Lhermitte's sign, no tenderness on spinous processes, no significant muscular tenderness on palpation Respiratory: no increased work of breathing Extremities: Moves upper and lower extremities freely, LUE with fistula and good palpable thrill, no infected appearance, nontender chest wall, grip strength 5/5 bilaterally, sensation and pulse intact bilaterally in UE  ASSESSMENT/PLAN:   Neck pain Suspect most likely muscle strain given positional and happened after laying in bed. No red flags on exam however does have history of some radiating pain to arms. Discussed we will start with topical Voltaren and tylenol q6h (given hx of kidney transplant). If not improved or any weakness, sensation loss, or shocks discussed returning to care. -voltaren gel qid -tylenol  prn -return/ed precautions discussed   AV fistula (HCC) Intermittently has pain over AV fistula on left side. Does not follow with vascular surgery but mom would like referral to ask more questions about this. No infected appearance and good thrill on exam. - Ambulatory referral to Vascular Surgery  Need for immunization against influenza - Flu Vaccine QUAD 22mo+IM (Fluarix, Fluzone & Alfiuria Quad PF)   Gerrit Heck, MD [place]

## 2022-03-11 NOTE — Assessment & Plan Note (Signed)
Suspect most likely muscle strain given positional and happened after laying in bed. No red flags on exam however does have history of some radiating pain to arms. Discussed we will start with topical Voltaren and tylenol q6h (given hx of kidney transplant). If not improved or any weakness, sensation loss, or shocks discussed returning to care. -voltaren gel qid -tylenol prn -return/ed precautions discussed

## 2022-03-11 NOTE — Patient Instructions (Addendum)
It was great to see you! Thank you for allowing me to participate in your care!   Our plans for today:  - Let's try tylenol 650 mg every 6 hours and a topical pain medicine, if having arm weakness, shock like sensation down arms, sensation loss please go to ED  - I have referred to vascular surgery about your fistula question  Take care and seek immediate care sooner if you develop any concerns.  Gerrit Heck, MD

## 2022-03-25 ENCOUNTER — Other Ambulatory Visit: Payer: Self-pay | Admitting: Family Medicine

## 2022-03-25 NOTE — Progress Notes (Signed)
Cardiology Office Note Date:  03/25/2022  Patient ID:  Vanessa Alvarez, Vanessa Alvarez Sep 07, 1985, MRN [id number] PCP:  [name], MD  Cardiologist:  Dr. Irish Lack (2018) Electrophysiologist: Dr. Quentin Ore    Chief Complaint:  1 year  History of Present Illness: Vanessa Alvarez is a 36 y.o. female with history of autism, MR, anxiety/depression, GERD, HTN, HLD, obesity, ESRF/HD >> renal transplant Aug 2021 on chronic immunosupression.  She comes in today to be seen for Dr. Quentin Ore, last seen by him 07/08/20 to follow up on her c/o CP. W/u including a CT chest, negative ETT,  and unremarkable echo were discussed.  Her CP felt to be atypical/non-cardiac and recommended to f/u with her PMD for non-cardiac etiologies and with him PRN.  She saw GI 10/07/2019 for the same, their note reports recent EGD with small hiatal hernia, no stricture or stenosis and no significant gastropathy or ulcer disease. She is status post cholecystectomy, planned for CT of the chest and CT abdomen. If these are unremarkable can consider manometry/pH study.  I saw her 10/21/20 She is accompanied by her mom. Her mom says that when they were gien the reports of her scans they mentioned that her heart was enlarged and wanted to follow up on this given her CP. The patient reports her CP  Located to low L rib border and central chest, they are sperate pains do not always happen together but can. The pain is a pin in size and momentary in duration but are very painful and she says when happens she will yell out in pain. It is not positional or exertional, sometimes seems to happen after eating but not always Happens perhaps every other day now for about a year and days that she has it can be several times a day No SOB generally but some days seems to get a little breathless sometimes No syncope TTE with normal sized and function LV CP felt to be atypical/non-cardiac. Planned for PRN visits  TODAY She is accompanied by her  mom. She has started having CP and some symptoms, worried about her heart. Started in the summer.  They are random with no particular trigger  CP, starts usually low L sternal boarder radiates towards mid chest, sharp/stabbing, unclear duration, no clear changes with position/exertion, just goes away eventually  L arm pain, unassociated with the CP, upper L chest/L shoulder, It is "just a pain", hard for her to charecterize more,   Sometimes is getting winded and a little lightheaded with exertion. Occasionally feels like the room is moving/dizzy, feels like the room is rocking no falls  No near syncope or syncope.  Past Medical History:  Diagnosis Date   Anemia    Iron Def   Anxiety    Autism spectrum    Behavioral problems    Depression    End stage renal disease ([place])    ESRD on dialysis ([place])    ESRD on HD dx 2016. Sees Dr Marval Regal with [facility].  Tues, Thurs, Sat schedule.     GERD (gastroesophageal reflux disease)    Hyperlipidemia    Hypertension    HYPOTHYROIDISM 03/12/2009   Qualifier: Diagnosis of  By: Ta MD, Cat     MENTAL RETARDATION 06/23/2006   Qualifier: Diagnosis of  By: Eusebio Friendly     Mood disorder [facility])    Sees Dr Silvio Pate, who prescribes meds   Neck pain 05/04/2017   Rectal bleeding 03/04/2015   Shortness of breath dyspnea  with exertion    Past Surgical History:  Procedure Laterality Date   AV FISTULA PLACEMENT Left 03/17/2015   Procedure: CREATION OF LEFT BRACHIOCEPHALIC ARTERIOVENOUS (AV) FISTULA ;  Surgeon: Conrad [place], MD;  Location: [place];  Service: Vascular;  Laterality: Left;   CHOLECYSTECTOMY  11/24/2004   INSERTION OF DIALYSIS CATHETER Right 03/17/2015   Procedure: INSERTION OF DIALYSIS CATHETER RIGHT INTERNAL JUIGULAR PLACEMENT;  Surgeon: Conrad , MD;  Location: [place];  Service: Vascular;  Laterality: Right;   KIDNEY TRANSPLANT Right 11/2019    Current Outpatient Medications  Medication Sig Dispense Refill    amLODipine (NORVASC) 10 MG tablet Take 1 tablet by mouth daily.     aripiprazole (ABILIFY) 10 MG disintegrating tablet Take 10 mg by mouth at bedtime.     aspirin EC 81 MG tablet Take 81 mg by mouth daily. Swallow whole.     benzonatate (TESSALON) 100 MG capsule Take 1 capsule (100 mg total) by mouth every 8 (eight) hours as needed for cough. 21 capsule 0   busPIRone (BUSPAR) 10 MG tablet Take 10 mg by mouth 3 (three) times daily.     carvedilol (COREG) 12.5 MG tablet TAKE 1 TABLET (12.5MG  TOTAL) BY MOUTH TWICE A DAY WITH MEALS 60 tablet 2   ferrous sulfate 325 (65 FE) MG tablet Take 1 tablet (325 mg total) by mouth 2 (two) times daily. 180 tablet 3   fluticasone (FLONASE) 50 MCG/ACT nasal spray Place 1 spray into both nostrils daily for 3 days. 16 g 0   hydrOXYzine (ATARAX/VISTARIL) 25 MG tablet Take 25 mg by mouth 3 (three) times daily as needed.     lamoTRIgine (LAMICTAL) 200 MG tablet Take 100 mg by mouth every morning. Takes 1 tablet at night and 1/2 tablet in the AM     levothyroxine (SYNTHROID) 112 MCG tablet TAKE 1 TABLET BY MOUTH EVERY DAY BEFORE BREAKFAST 30 tablet 5   mycophenolate (MYFORTIC) 180 MG EC tablet Take 180 mg by mouth See admin instructions. Take 3 tablets in the morning and 3 tablets in the evening     omeprazole (PRILOSEC) 20 MG capsule Take 1 capsule (20 mg total) by mouth 2 (two) times daily before a meal. Please keep your December appointment for further refills. Thank you 60 capsule 2   polyethylene glycol powder (GLYCOLAX/MIRALAX) 17 GM/SCOOP powder Take 17 g by mouth daily as needed for mild constipation. Reported on 05/26/2015 255 g 3   predniSONE (DELTASONE) 5 MG tablet Take 5 mg by mouth daily.     sennosides-docusate sodium (SENOKOT-S) 8.6-50 MG tablet Take 1 tablet by mouth in the morning and at bedtime.     sertraline (ZOLOFT) 100 MG tablet Take 100 mg by mouth daily.      tacrolimus (PROGRAF) 1 MG capsule Take 3 mg by mouth daily. (Patient not taking: Reported on  12/14/2021)     tacrolimus ER (ENVARSUS XR) 4 MG TB24 Take 4 mg by mouth daily before breakfast.     Current Facility-Administered Medications  Medication Dose Route Frequency Provider Last Rate Last Admin   diclofenac Sodium (VOLTAREN) 1 % topical gel 4 g  4 g Topical QID PRN [name], MD        Allergies:   Patient has no known allergies.   Social History:  The patient  reports that she has never smoked. She has never used smokeless tobacco. She reports that she does not drink alcohol and does not use drugs.   Family  History:  The patient's family history includes Bladder Cancer in her maternal grandmother; Cancer in her maternal grandfather; Diabetes in her maternal grandfather and maternal grandmother; Heart disease in her maternal grandmother; Hypertension in her maternal grandmother and mother.  ROS:  Please see the history of present illness.    All other systems are reviewed and otherwise negative.   PHYSICAL EXAM:  VS:  LMP 03/04/2022  BMI: There is no height or weight on file to calculate BMI. Well nourished, well developed, in no acute distress HEENT: normocephalic, atraumatic Neck: no JVD, carotid bruits or masses Cardiac: RRR; no significant murmurs, no rubs, or gallops Lungs:  CTA b/l, no wheezing, rhonchi or rales Abd: soft, nontender MS: no deformity or atrophy Ext: no edema Skin: warm and dry, no rash Neuro:  No gross deficits appreciated Psych: euthymic mood, full affect   EKG:  done today and reviewed by myself SR 83bpm   June 02, 2020 [facility]  The left ventricular size is normal.  Left ventricular systolic function is normal.  LV ejection fraction = 60-65%.  The right ventricle is normal in size and function.  The left atrium is mildly dilated.  There is mild to moderate mitral regurgitation.  No significant stenosis seen  There was insufficient TR detected to calculate RV systolic pressure.  Estimated right atrial pressure is 5 mmHg.Marland Kitchen   There is no pericardial effusion.   05/15/2020: ETT Heart rate at rest 108 bpm, at peak stress 139 bpm There was no ST segment deviation noted during stress. No electrocardiographic evidence of ischemia noted at heart rate achieved (75%). She was not able to achieve target heart rate secondary to fatigue and shortness of breath.    March 27, 2020  CT chest abdomen pelvis 1.  Right iliac fossa renal transplant with mildly edematous appearance and surrounding perinephric stranding raising concern for infection. Consider correlation with urinalysis. No overt hydronephrosis or large perinephric collections.  2.  Redemonstrated enlarged leiomyomatous uterus.  3.  Additional findings as above.  Recent Labs: 05/01/2021: TSH 3.390  No results found for requested labs within last 365 days.   CrCl cannot be calculated (Patient's most recent lab result is older than the maximum 21 days allowed.).   Wt Readings from Last 3 Encounters:  03/11/22 220 lb (99.8 kg)  12/14/21 218 lb (98.9 kg)  07/07/21 215 lb 6.4 oz (97.7 kg)     Other studies reviewed: Additional studies/records reviewed today include: summarized above  ASSESSMENT AND PLAN:  CP Very atypical of cardiac etiology W/u as noted above and non-cardiac by prior evaluations/visits  2. DOE? At times   New symptoms, pt/Mom are concerned about her heart We will update her echo and go from there. Symptoms do not sound of typical angina, volume OL.   Disposition: 2-3 mo, sooner if needed   Current medicines are reviewed at length with the patient today.  The patient did not have any concerns regarding medicines.  Venetia Night, PA-C 03/25/2022 12:42 PM     [address] [place] [address] [phone] (office)  [phone] (fax)

## 2022-03-26 ENCOUNTER — Encounter: Payer: Self-pay | Admitting: Physician Assistant

## 2022-03-26 ENCOUNTER — Ambulatory Visit: Payer: Medicare Other | Attending: Physician Assistant | Admitting: Physician Assistant

## 2022-03-26 ENCOUNTER — Other Ambulatory Visit: Payer: Self-pay | Admitting: *Deleted

## 2022-03-26 VITALS — BP 130/70 | HR 88 | Ht 64.0 in | Wt 224.2 lb

## 2022-03-26 DIAGNOSIS — R079 Chest pain, unspecified: Secondary | ICD-10-CM | POA: Diagnosis present

## 2022-03-26 DIAGNOSIS — N186 End stage renal disease: Secondary | ICD-10-CM

## 2022-03-26 DIAGNOSIS — R0602 Shortness of breath: Secondary | ICD-10-CM | POA: Insufficient documentation

## 2022-03-26 NOTE — Patient Instructions (Signed)
Medication Instructions:   Your physician recommends that you continue on your current medications as directed. Please refer to the Current Medication list given to you today.  *If you need a refill on your cardiac medications before your next appointment, please call your pharmacy*   Lab Work: [place]   If you have labs (blood work) drawn today and your tests are completely normal, you will receive your results only by: [place] (if you have MyChart) OR A paper copy in the mail If you have any lab test that is abnormal or we need to change your treatment, we will call you to review the results.   Testing/Procedures: Your physician has requested that you have an echocardiogram. Echocardiography is a painless test that uses sound waves to create images of your heart. It provides your doctor with information about the size and shape of your heart and how well your heart's chambers and valves are working. This procedure takes approximately one hour. There are no restrictions for this procedure. Please do NOT wear cologne, perfume, aftershave, or lotions (deodorant is allowed). Please arrive 15 minutes prior to your appointment time.     Follow-Up: At [facility], you and your health needs are our priority.  As part of our continuing mission to provide you with exceptional heart care, we have created designated Provider Care Teams.  These Care Teams include your primary Cardiologist (physician) and Advanced Practice Providers (APPs -  Physician Assistants and Nurse Practitioners) who all work together to provide you with the care you need, when you need it.  We recommend signing up for the patient portal called "MyChart".  Sign up information is provided on this After Visit Summary.  MyChart is used to connect with patients for Virtual Visits (Telemedicine).  Patients are able to view lab/test results, encounter notes, upcoming appointments, etc.  Non-urgent  messages can be sent to your provider as well.   To learn more about what you can do with MyChart, go to [email or web address].    Your next appointment:   2 -3 month(s)  The format for your next appointment:   In Person  Provider:   Tommye Standard, PA-C    Other Instructions   Important Information About Sugar

## 2022-03-29 ENCOUNTER — Ambulatory Visit (HOSPITAL_COMMUNITY)
Admission: RE | Admit: 2022-03-29 | Discharge: 2022-03-29 | Disposition: A | Discharge status: Home / Self Care | Payer: Medicare Other | Source: Ambulatory Visit | Attending: Surgery | Admitting: Surgery

## 2022-03-29 DIAGNOSIS — N186 End stage renal disease: Secondary | ICD-10-CM

## 2022-04-01 ENCOUNTER — Encounter: Payer: Self-pay | Admitting: Vascular Surgery

## 2022-04-01 ENCOUNTER — Ambulatory Visit (INDEPENDENT_AMBULATORY_CARE_PROVIDER_SITE_OTHER): Payer: Medicare Other | Admitting: Vascular Surgery

## 2022-04-01 VITALS — BP 172/95 | HR 86 | Temp 97.7°F | Resp 20 | Ht 64.0 in | Wt 223.0 lb

## 2022-04-01 DIAGNOSIS — N186 End stage renal disease: Secondary | ICD-10-CM | POA: Diagnosis not present

## 2022-04-01 DIAGNOSIS — Z992 Dependence on renal dialysis: Secondary | ICD-10-CM | POA: Diagnosis not present

## 2022-04-01 NOTE — H&P (View-Only) (Signed)
REASON FOR VISIT:   Intermittent pain in the left arm from an AV fistula.  The consult is requested by Dr. Myrene Buddy.  MEDICAL ISSUES:   END-STAGE RENAL DISEASE: This patient has an aneurysmal left upper arm fistula which is painful.  This is not being used as she has a functioning cadaveric renal transplant.  We have discussed 3 options.  First, we could proceed with a fistulogram to see if there is an outflow stenosis that could potentially be addressed that might help with her pain.  I am concerned she has a stent in the cephalic vein near the clavicle and that her options may be limited for continuing to address any outflow stenosis.  However, I think it is worth a chance versus sacrificing the fistula.  The other options would be to ligate the fistula, or to ligate the fistula and resect the aneurysmal areas.  After extensive discussion with the patient and her her mother we decided to proceed with a fistulogram and possible angioplasty.  This has been scheduled for 03/30/2022.  We will make further recommendations pending these results.   HPI:   Vanessa Alvarez is a pleasant 36 y.o. female who is referred with pain in her left arm related to her AV fistula.  I have reviewed the records from the referring office.  The patient was seen on 03/01/2022.  The patient has a history of autism.  She also has history of hypertension, FSGS, hyperlipidemia, and end-stage renal disease.  She had a renal transplant at [facility] in August 2021 and has done well from that standpoint.  The patient complains of significant pain where her left upper arm fistula is.  This is been present for months.  She denies pain in the hand.  The pain is mostly in the upper arm.  She had a cadaveric renal transplant in 2021 and this has been working well.  Past Medical History:  Diagnosis Date   Anemia    Iron Def   Anxiety    Autism spectrum    Behavioral problems    Depression    End stage renal disease  ([place])    ESRD on dialysis ([place])    ESRD on HD dx 2016. Sees Dr Marval Regal with [facility].  Tues, Thurs, Sat schedule.     GERD (gastroesophageal reflux disease)    Hyperlipidemia    Hypertension    HYPOTHYROIDISM 03/12/2009   Qualifier: Diagnosis of  By: Ta MD, Cat     MENTAL RETARDATION 06/23/2006   Qualifier: Diagnosis of  By: Eusebio Friendly     Mood disorder [facility])    Sees Dr Silvio Pate, who prescribes meds   Neck pain 05/04/2017   Rectal bleeding 03/04/2015   Shortness of breath dyspnea    with exertion    Family History  Problem Relation Age of Onset   Hypertension Mother    Diabetes Maternal Grandmother    Hypertension Maternal Grandmother    Heart disease Maternal Grandmother    Bladder Cancer Maternal Grandmother    Diabetes Maternal Grandfather    Cancer Maternal Grandfather        prostate   Colon cancer Neg Hx    Pancreatic cancer Neg Hx    Esophageal cancer Neg Hx    Liver disease Neg Hx    Stomach cancer Neg Hx    Rectal cancer Neg Hx     SOCIAL HISTORY: Social History   Tobacco Use   Smoking status: Never  Smokeless tobacco: Never  Substance Use Topics   Alcohol use: No    Alcohol/week: 0.0 standard drinks of alcohol    No Known Allergies  Current Outpatient Medications  Medication Sig Dispense Refill   amLODipine (NORVASC) 10 MG tablet Take 1 tablet by mouth daily.     aripiprazole (ABILIFY) 10 MG disintegrating tablet Take 10 mg by mouth at bedtime.     aspirin EC 81 MG tablet Take 81 mg by mouth daily. Swallow whole.     busPIRone (BUSPAR) 10 MG tablet Take 10 mg by mouth 3 (three) times daily.     carvedilol (COREG) 12.5 MG tablet TAKE 1 TABLET (12.5MG  TOTAL) BY MOUTH TWICE A DAY WITH MEALS 60 tablet 2   chlorthalidone (HYGROTON) 25 MG tablet Take 0.5 tablets by mouth daily.     ferrous sulfate 325 (65 FE) MG tablet Take 1 tablet (325 mg total) by mouth 2 (two) times daily. 180 tablet 3   hydrOXYzine (ATARAX/VISTARIL) 25 MG tablet  Take 25 mg by mouth 3 (three) times daily as needed.     lamoTRIgine (LAMICTAL) 200 MG tablet Take 100 mg by mouth every morning. Takes 1 tablet at night and 1/2 tablet in the AM     levothyroxine (SYNTHROID) 112 MCG tablet TAKE 1 TABLET BY MOUTH EVERY DAY BEFORE BREAKFAST 30 tablet 5   mycophenolate (MYFORTIC) 180 MG EC tablet Take 180 mg by mouth See admin instructions. Take 3 tablets in the morning and 3 tablets in the evening     omeprazole (PRILOSEC) 20 MG capsule Take 1 capsule (20 mg total) by mouth 2 (two) times daily before a meal. Please keep your December appointment for further refills. Thank you 60 capsule 2   polyethylene glycol powder (GLYCOLAX/MIRALAX) 17 GM/SCOOP powder Take 17 g by mouth daily as needed for mild constipation. Reported on 05/26/2015 255 g 3   predniSONE (DELTASONE) 5 MG tablet Take 5 mg by mouth daily.     sennosides-docusate sodium (SENOKOT-S) 8.6-50 MG tablet Take 1 tablet by mouth in the morning and at bedtime.     sertraline (ZOLOFT) 100 MG tablet Take 100 mg by mouth daily.      sulfamethoxazole-trimethoprim (BACTRIM) 400-80 MG tablet Take 1 tablet by mouth 3 (three) times a week. M-W-F     tacrolimus (PROGRAF) 1 MG capsule Take 3 mg by mouth daily.     tacrolimus ER (ENVARSUS XR) 4 MG TB24 Take 4 mg by mouth daily before breakfast.     Current Facility-Administered Medications  Medication Dose Route Frequency Provider Last Rate Last Admin   diclofenac Sodium (VOLTAREN) 1 % topical gel 4 g  4 g Topical QID PRN [name], MD        REVIEW OF SYSTEMS:  [X]  denotes positive finding, [ ]  denotes negative finding Cardiac  Comments:  Chest pain or chest pressure: x   Shortness of breath upon exertion: x   Short of breath when lying flat:    Irregular heart rhythm:        Vascular    Pain in calf, thigh, or hip brought on by ambulation:    Pain in feet at night that wakes you up from your sleep:     Blood clot in your veins:    Leg swelling:          Pulmonary    Oxygen at home:    Productive cough:     Wheezing:         Neurologic  Sudden weakness in arms or legs:     Sudden numbness in arms or legs:     Sudden onset of difficulty speaking or slurred speech:    Temporary loss of vision in one eye:     Problems with dizziness:  x       Gastrointestinal    Blood in stool:     Vomited blood:         Genitourinary    Burning when urinating:     Blood in urine:        Psychiatric    Major depression:         Hematologic    Bleeding problems:    Problems with blood clotting too easily:        Skin    Rashes or ulcers: x       Constitutional    Fever or chills:     PHYSICAL EXAM:   Vitals:   04/01/22 1250  BP: (!) 172/95  Pulse: 86  Resp: 20  Temp: 97.7 F (36.5 C)  SpO2: 96%  Weight: 223 lb (101.2 kg)  Height: 5\' 4"  (1.626 m)    GENERAL: The patient is a well-nourished female, in no acute distress. The vital signs are documented above. CARDIAC: There is a regular rate and rhythm.  VASCULAR: I do not detect carotid bruits. She has palpable radial pulses. Her left upper arm fistula is aneurysmal, tortuous, and pulsatile. PULMONARY: There is good air exchange bilaterally without wheezing or rales. MUSCULOSKELETAL: There are no major deformities or cyanosis. NEUROLOGIC: No focal weakness or paresthesias are detected. SKIN: There are no ulcers or rashes noted. PSYCHIATRIC: The patient has a normal affect.  DATA:    DUPLEX AV FISTULA: I reviewed the duplex of the left AV fistula that was done on 03/29/2022.  The fistula was patent.  It was tortuous and aneurysmal.  There was a stent present in the cephalic vein near the clavicle.  Deitra Mayo [facility] [phone]

## 2022-04-01 NOTE — Progress Notes (Signed)
REASON FOR VISIT:   Intermittent pain in the left arm from an AV fistula.  The consult is requested by Dr. Myrene Buddy.  MEDICAL ISSUES:   END-STAGE RENAL DISEASE: This patient has an aneurysmal left upper arm fistula which is painful.  This is not being used as she has a functioning cadaveric renal transplant.  We have discussed 3 options.  First, we could proceed with a fistulogram to see if there is an outflow stenosis that could potentially be addressed that might help with her pain.  I am concerned she has a stent in the cephalic vein near the clavicle and that her options may be limited for continuing to address any outflow stenosis.  However, I think it is worth a chance versus sacrificing the fistula.  The other options would be to ligate the fistula, or to ligate the fistula and resect the aneurysmal areas.  After extensive discussion with the patient and her her mother we decided to proceed with a fistulogram and possible angioplasty.  This has been scheduled for 03/30/2022.  We will make further recommendations pending these results.   HPI:   Vanessa Alvarez is a pleasant 36 y.o. female who is referred with pain in her left arm related to her AV fistula.  I have reviewed the records from the referring office.  The patient was seen on 03/01/2022.  The patient has a history of autism.  She also has history of hypertension, FSGS, hyperlipidemia, and end-stage renal disease.  She had a renal transplant at [facility] in August 2021 and has done well from that standpoint.  The patient complains of significant pain where her left upper arm fistula is.  This is been present for months.  She denies pain in the hand.  The pain is mostly in the upper arm.  She had a cadaveric renal transplant in 2021 and this has been working well.  Past Medical History:  Diagnosis Date   Anemia    Iron Def   Anxiety    Autism spectrum    Behavioral problems    Depression    End stage renal disease  ([place])    ESRD on dialysis ([place])    ESRD on HD dx 2016. Sees Dr Marval Regal with [facility].  Tues, Thurs, Sat schedule.     GERD (gastroesophageal reflux disease)    Hyperlipidemia    Hypertension    HYPOTHYROIDISM 03/12/2009   Qualifier: Diagnosis of  By: Ta MD, Cat     MENTAL RETARDATION 06/23/2006   Qualifier: Diagnosis of  By: Eusebio Friendly     Mood disorder [facility])    Sees Dr Silvio Pate, who prescribes meds   Neck pain 05/04/2017   Rectal bleeding 03/04/2015   Shortness of breath dyspnea    with exertion    Family History  Problem Relation Age of Onset   Hypertension Mother    Diabetes Maternal Grandmother    Hypertension Maternal Grandmother    Heart disease Maternal Grandmother    Bladder Cancer Maternal Grandmother    Diabetes Maternal Grandfather    Cancer Maternal Grandfather        prostate   Colon cancer Neg Hx    Pancreatic cancer Neg Hx    Esophageal cancer Neg Hx    Liver disease Neg Hx    Stomach cancer Neg Hx    Rectal cancer Neg Hx     SOCIAL HISTORY: Social History   Tobacco Use   Smoking status: Never  Smokeless tobacco: Never  Substance Use Topics   Alcohol use: No    Alcohol/week: 0.0 standard drinks of alcohol    No Known Allergies  Current Outpatient Medications  Medication Sig Dispense Refill   amLODipine (NORVASC) 10 MG tablet Take 1 tablet by mouth daily.     aripiprazole (ABILIFY) 10 MG disintegrating tablet Take 10 mg by mouth at bedtime.     aspirin EC 81 MG tablet Take 81 mg by mouth daily. Swallow whole.     busPIRone (BUSPAR) 10 MG tablet Take 10 mg by mouth 3 (three) times daily.     carvedilol (COREG) 12.5 MG tablet TAKE 1 TABLET (12.5MG  TOTAL) BY MOUTH TWICE A DAY WITH MEALS 60 tablet 2   chlorthalidone (HYGROTON) 25 MG tablet Take 0.5 tablets by mouth daily.     ferrous sulfate 325 (65 FE) MG tablet Take 1 tablet (325 mg total) by mouth 2 (two) times daily. 180 tablet 3   hydrOXYzine (ATARAX/VISTARIL) 25 MG tablet  Take 25 mg by mouth 3 (three) times daily as needed.     lamoTRIgine (LAMICTAL) 200 MG tablet Take 100 mg by mouth every morning. Takes 1 tablet at night and 1/2 tablet in the AM     levothyroxine (SYNTHROID) 112 MCG tablet TAKE 1 TABLET BY MOUTH EVERY DAY BEFORE BREAKFAST 30 tablet 5   mycophenolate (MYFORTIC) 180 MG EC tablet Take 180 mg by mouth See admin instructions. Take 3 tablets in the morning and 3 tablets in the evening     omeprazole (PRILOSEC) 20 MG capsule Take 1 capsule (20 mg total) by mouth 2 (two) times daily before a meal. Please keep your December appointment for further refills. Thank you 60 capsule 2   polyethylene glycol powder (GLYCOLAX/MIRALAX) 17 GM/SCOOP powder Take 17 g by mouth daily as needed for mild constipation. Reported on 05/26/2015 255 g 3   predniSONE (DELTASONE) 5 MG tablet Take 5 mg by mouth daily.     sennosides-docusate sodium (SENOKOT-S) 8.6-50 MG tablet Take 1 tablet by mouth in the morning and at bedtime.     sertraline (ZOLOFT) 100 MG tablet Take 100 mg by mouth daily.      sulfamethoxazole-trimethoprim (BACTRIM) 400-80 MG tablet Take 1 tablet by mouth 3 (three) times a week. M-W-F     tacrolimus (PROGRAF) 1 MG capsule Take 3 mg by mouth daily.     tacrolimus ER (ENVARSUS XR) 4 MG TB24 Take 4 mg by mouth daily before breakfast.     Current Facility-Administered Medications  Medication Dose Route Frequency Provider Last Rate Last Admin   diclofenac Sodium (VOLTAREN) 1 % topical gel 4 g  4 g Topical QID PRN [name], MD        REVIEW OF SYSTEMS:  [X]  denotes positive finding, [ ]  denotes negative finding Cardiac  Comments:  Chest pain or chest pressure: x   Shortness of breath upon exertion: x   Short of breath when lying flat:    Irregular heart rhythm:        Vascular    Pain in calf, thigh, or hip brought on by ambulation:    Pain in feet at night that wakes you up from your sleep:     Blood clot in your veins:    Leg swelling:          Pulmonary    Oxygen at home:    Productive cough:     Wheezing:         Neurologic  Sudden weakness in arms or legs:     Sudden numbness in arms or legs:     Sudden onset of difficulty speaking or slurred speech:    Temporary loss of vision in one eye:     Problems with dizziness:  x       Gastrointestinal    Blood in stool:     Vomited blood:         Genitourinary    Burning when urinating:     Blood in urine:        Psychiatric    Major depression:         Hematologic    Bleeding problems:    Problems with blood clotting too easily:        Skin    Rashes or ulcers: x       Constitutional    Fever or chills:     PHYSICAL EXAM:   Vitals:   04/01/22 1250  BP: (!) 172/95  Pulse: 86  Resp: 20  Temp: 97.7 F (36.5 C)  SpO2: 96%  Weight: 223 lb (101.2 kg)  Height: 5\' 4"  (1.626 m)    GENERAL: The patient is a well-nourished female, in no acute distress. The vital signs are documented above. CARDIAC: There is a regular rate and rhythm.  VASCULAR: I do not detect carotid bruits. She has palpable radial pulses. Her left upper arm fistula is aneurysmal, tortuous, and pulsatile. PULMONARY: There is good air exchange bilaterally without wheezing or rales. MUSCULOSKELETAL: There are no major deformities or cyanosis. NEUROLOGIC: No focal weakness or paresthesias are detected. SKIN: There are no ulcers or rashes noted. PSYCHIATRIC: The patient has a normal affect.  DATA:    DUPLEX AV FISTULA: I reviewed the duplex of the left AV fistula that was done on 03/29/2022.  The fistula was patent.  It was tortuous and aneurysmal.  There was a stent present in the cephalic vein near the clavicle.  Deitra Mayo [facility] [phone]

## 2022-04-02 ENCOUNTER — Telehealth: Payer: Self-pay

## 2022-04-02 ENCOUNTER — Other Ambulatory Visit: Payer: Self-pay

## 2022-04-02 DIAGNOSIS — N186 End stage renal disease: Secondary | ICD-10-CM

## 2022-04-02 NOTE — Telephone Encounter (Signed)
Attempted to schedule patient for fistulogram of left upper extremity. Left message for patient's mother at the home and cell number to return call.

## 2022-04-02 NOTE — Telephone Encounter (Signed)
Spoke with Delilah, patient's mother. Patient scheduled for fistulogram on 04/09/22. Instructions reviewed. Mother verbalized understanding.

## 2022-04-09 ENCOUNTER — Other Ambulatory Visit: Payer: Self-pay | Admitting: Family Medicine

## 2022-04-09 ENCOUNTER — Ambulatory Visit (HOSPITAL_COMMUNITY)
Admission: RE | Admit: 2022-04-09 | Discharge: 2022-04-09 | Disposition: A | Discharge status: Home / Self Care | Payer: Medicare Other | Attending: Vascular Surgery | Admitting: Vascular Surgery

## 2022-04-09 ENCOUNTER — Encounter (HOSPITAL_COMMUNITY)
Admission: RE | Disposition: A | Discharge status: Home / Self Care | Payer: Self-pay | Source: Home / Self Care | Attending: Vascular Surgery

## 2022-04-09 ENCOUNTER — Other Ambulatory Visit: Payer: Self-pay

## 2022-04-09 ENCOUNTER — Other Ambulatory Visit: Payer: Self-pay | Admitting: Gastroenterology

## 2022-04-09 ENCOUNTER — Encounter (HOSPITAL_COMMUNITY): Payer: Self-pay | Admitting: Vascular Surgery

## 2022-04-09 DIAGNOSIS — Y832 Surgical operation with anastomosis, bypass or graft as the cause of abnormal reaction of the patient, or of later complication, without mention of misadventure at the time of the procedure: Secondary | ICD-10-CM | POA: Insufficient documentation

## 2022-04-09 DIAGNOSIS — E785 Hyperlipidemia, unspecified: Secondary | ICD-10-CM | POA: Insufficient documentation

## 2022-04-09 DIAGNOSIS — N185 Chronic kidney disease, stage 5: Secondary | ICD-10-CM | POA: Diagnosis not present

## 2022-04-09 DIAGNOSIS — F840 Autistic disorder: Secondary | ICD-10-CM | POA: Insufficient documentation

## 2022-04-09 DIAGNOSIS — T82898A Other specified complication of vascular prosthetic devices, implants and grafts, initial encounter: Secondary | ICD-10-CM | POA: Insufficient documentation

## 2022-04-09 DIAGNOSIS — I120 Hypertensive chronic kidney disease with stage 5 chronic kidney disease or end stage renal disease: Secondary | ICD-10-CM | POA: Insufficient documentation

## 2022-04-09 DIAGNOSIS — E039 Hypothyroidism, unspecified: Secondary | ICD-10-CM

## 2022-04-09 DIAGNOSIS — Z940 Kidney transplant status: Secondary | ICD-10-CM | POA: Insufficient documentation

## 2022-04-09 DIAGNOSIS — N186 End stage renal disease: Secondary | ICD-10-CM

## 2022-04-09 DIAGNOSIS — Z01818 Encounter for other preprocedural examination: Secondary | ICD-10-CM

## 2022-04-09 HISTORY — PX: A/V FISTULAGRAM: CATH118298

## 2022-04-09 LAB — POCT I-STAT, CHEM 8
BUN: 16 mg/dL (ref 6–20)
Calcium, Ion: 1.27 mmol/L (ref 1.15–1.40)
Chloride: 101 mmol/L (ref 98–111)
Creatinine, Ser: 1.4 mg/dL — ABNORMAL HIGH (ref 0.44–1.00)
Glucose, Bld: 100 mg/dL — ABNORMAL HIGH (ref 70–99)
HCT: 35 % — ABNORMAL LOW (ref 36.0–46.0)
Hemoglobin: 11.9 g/dL — ABNORMAL LOW (ref 12.0–15.0)
Potassium: 4.6 mmol/L (ref 3.5–5.1)
Sodium: 137 mmol/L (ref 135–145)
TCO2: 28 mmol/L (ref 22–32)

## 2022-04-09 LAB — PREGNANCY, URINE: Preg Test, Ur: NEGATIVE

## 2022-04-09 SURGERY — A/V FISTULAGRAM
Anesthesia: LOCAL | Laterality: Left

## 2022-04-09 MED ORDER — FENTANYL CITRATE (PF) 100 MCG/2ML IJ SOLN
INTRAMUSCULAR | Status: DC | PRN
  Administered 2022-04-09: 50 ug via INTRAVENOUS

## 2022-04-09 MED ORDER — FENTANYL CITRATE (PF) 100 MCG/2ML IJ SOLN
INTRAMUSCULAR | Status: AC
  Filled 2022-04-09: qty 2

## 2022-04-09 MED ORDER — MIDAZOLAM HCL 2 MG/2ML IJ SOLN
INTRAMUSCULAR | Status: AC
  Filled 2022-04-09: qty 2

## 2022-04-09 MED ORDER — HEPARIN (PORCINE) IN NACL 1000-0.9 UT/500ML-% IV SOLN
INTRAVENOUS | Status: DC | PRN
  Administered 2022-04-09: 500 mL

## 2022-04-09 MED ORDER — MIDAZOLAM HCL 2 MG/2ML IJ SOLN
INTRAMUSCULAR | Status: DC | PRN
  Administered 2022-04-09: 1 mg via INTRAVENOUS

## 2022-04-09 MED ORDER — LIDOCAINE HCL (PF) 1 % IJ SOLN
INTRAMUSCULAR | Status: AC
  Filled 2022-04-09: qty 30

## 2022-04-09 MED ORDER — LIDOCAINE HCL (PF) 1 % IJ SOLN
INTRAMUSCULAR | Status: DC | PRN
  Administered 2022-04-09: 2 mL

## 2022-04-09 MED ORDER — HEPARIN (PORCINE) IN NACL 1000-0.9 UT/500ML-% IV SOLN
INTRAVENOUS | Status: AC
  Filled 2022-04-09: qty 500

## 2022-04-09 MED ORDER — IODIXANOL 320 MG/ML IV SOLN
INTRAVENOUS | Status: DC | PRN
  Administered 2022-04-09: 55 mL

## 2022-04-09 SURGICAL SUPPLY — 8 items
COVER DOME SNAP 22 D (MISCELLANEOUS) ×2 IMPLANT
KIT MICROPUNCTURE NIT STIFF (SHEATH) IMPLANT
PROTECTION STATION PRESSURIZED (MISCELLANEOUS) ×1
SHEATH PROBE COVER 6X72 (BAG) ×2 IMPLANT
STATION PROTECTION PRESSURIZED (MISCELLANEOUS) ×2 IMPLANT
STOPCOCK MORSE 400PSI 3WAY (MISCELLANEOUS) ×2 IMPLANT
TRAY PV CATH (CUSTOM PROCEDURE TRAY) ×2 IMPLANT
TUBING CIL FLEX 10 FLL-RA (TUBING) ×2 IMPLANT

## 2022-04-09 NOTE — Op Note (Signed)
    NAME: Vanessa Alvarez    MRN: [id number] DOB: March 28, 1986    DATE OF OPERATION: 04/09/2022  PREOP DIAGNOSIS:    Painful left upper arm fistula  POSTOP DIAGNOSIS:    Same  PROCEDURE:    Ultrasound-guided access to left upper arm fistula Fistulogram left upper arm fistula  SURGEON: Judeth Cornfield. Scot Dock, MD  ASSIST: None  ANESTHESIA: Local with sedation  EBL: Minimal  INDICATIONS:    Vanessa Alvarez is a 36 y.o. female who has a cadaveric renal transplant which has been functioning well.  She has an old fistula in her left arm which is painful.  It was pulsatile.  Ultimately we decided to perform a fistulogram to see if there was any outflow obstruction which could be addressed which might help with the pain with the fistula.  Otherwise she would likely benefit from ligation of the fistula.  FINDINGS:   Large aneurysmal left upper arm fistula with no significant stenoses identified.  TECHNIQUE:   The patient was taken to the PV lab and sedated with 1 mg of Versed and 50 mcg of fentanyl.  The left arm was prepped and draped in usual sterile fashion.  Under ultrasound guidance, after the skin was anesthetized, I cannulated the proximal fistula with a micropuncture needle and a micropuncture sheath was introduced over a wire.  Fistulogram was then obtained to evaluate the fistula to include the central veins.  Also compression of the fistula was performed to do a reflux shot to evaluate the arterial anastomosis.  At the completion the cannulation site was closed with a 4-0 Monocryl.  Deitra Mayo, MD, FACS [facility]  DATE OF DICTATION:   04/09/2022

## 2022-04-09 NOTE — Interval H&P Note (Signed)
History and Physical Interval Note:  04/09/2022 10:26 AM  Vanessa Alvarez  has presented today for surgery, with the diagnosis of End stage renal disease.  The various methods of treatment have been discussed with the patient and family. After consideration of risks, benefits and other options for treatment, the patient has consented to  Procedure(s): A/V Fistulagram (Left) as a surgical intervention.  The patient's history has been reviewed, patient examined, no change in status, stable for surgery.  I have reviewed the patient's chart and labs.  Questions were answered to the patient's satisfaction.     Deitra Mayo

## 2022-04-09 NOTE — Progress Notes (Signed)
Pt and mother received d/c instructions, written and verbal. All questions and concerns addressed. PIV removed and intact. Procedure site remains intact, no bleeding or discomfort noted. Pt left in stable condition.

## 2022-04-12 ENCOUNTER — Telehealth: Payer: Self-pay | Admitting: *Deleted

## 2022-04-12 NOTE — Telephone Encounter (Signed)
Patient's mom called and states patient has a bumpy rash around her fistula area that is itchy she is asking if its alright for patient to take benadryl for this. Advised her if patient can tolerate benadryl its ok to take for itching. Also cool compress to that area can be used. She will call us back if any other symptoms.

## 2022-04-13 ENCOUNTER — Ambulatory Visit: Payer: Medicare Other | Admitting: Gastroenterology

## 2022-04-16 ENCOUNTER — Ambulatory Visit (HOSPITAL_COMMUNITY): Payer: Medicare Other | Attending: Physician Assistant

## 2022-04-16 DIAGNOSIS — R0602 Shortness of breath: Secondary | ICD-10-CM | POA: Insufficient documentation

## 2022-04-16 LAB — ECHOCARDIOGRAM COMPLETE
Area-P 1/2: 3.7 cm2
MV M vel: 4.85 m/s
MV Peak grad: 94.1 mmHg
S' Lateral: 2.9 cm

## 2022-05-28 ENCOUNTER — Other Ambulatory Visit: Payer: Self-pay | Admitting: Gastroenterology

## 2022-06-10 NOTE — Progress Notes (Signed)
Cardiology Office Note Date:  06/11/2022  Patient ID:  Alvarez, Vanessa March 18, 1986, MRN [id number] PCP:  [name], MD  Cardiologist:  None Electrophysiologist: Vickie Epley, MD    Chief Complaint: chest pain  History of Present Illness: Vanessa Alvarez is a 37 y.o. female with PMH notable for autism, MR, anxiety/depression, GERD, HTN, HLD, obesity, ESRF/HD > renal txp, scoliosis; seen today for Vickie Epley, MD for routine electrophysiology followup.  She was last seen by PA Charlcie Cradle 03/2022 for further workup of CP.  They are random with no particular trigger  CP, starts usually low L sternal boarder radiates towards mid chest, sharp/stabbing, unclear duration, no clear changes with position/exertion, just goes away eventually  L arm pain, unassociated with the CP, upper L chest/L shoulder, It is "just a pain", hard for her to charecterize more,   Sometimes is getting winded and a little lightheaded with exertion. Occasionally feels like the room is moving/dizzy, feels like the room is rocking no falls To date, workup has been unrevealing of a cardiac cause of complaints.   Today, she continues to have central chest pain that radiates to back. Not associated with activity or certain movement. She is sometimes dizzy when having chest pain, but no frank syncope or presyncope. Denies SOB and palpitations during CP. Episodes happen a couple times a day, none right now.   Patient's mother accompanies her during the appt.   Past Medical History:  Diagnosis Date   Anemia    Iron Def   Anxiety    Autism spectrum    Behavioral problems    Depression    End stage renal disease ([place])    ESRD on dialysis ([place])    ESRD on HD dx 2016. Sees Dr Marval Regal with [facility].  Tues, Thurs, Sat schedule.     GERD (gastroesophageal reflux disease)    Hyperlipidemia    Hypertension    HYPOTHYROIDISM 03/12/2009   Qualifier: Diagnosis of  By: Ta MD, Cat     MENTAL  RETARDATION 06/23/2006   Qualifier: Diagnosis of  By: Eusebio Friendly     Mood disorder [facility])    Sees Dr Silvio Pate, who prescribes meds   Neck pain 05/04/2017   Rectal bleeding 03/04/2015   Shortness of breath dyspnea    with exertion    Past Surgical History:  Procedure Laterality Date   A/V FISTULAGRAM Left 04/09/2022   Procedure: A/V Fistulagram;  Surgeon: Angelia Mould, MD;  Location: [place] [facility];  Service: Cardiovascular;  Laterality: Left;   AV FISTULA PLACEMENT Left 03/17/2015   Procedure: CREATION OF LEFT BRACHIOCEPHALIC ARTERIOVENOUS (AV) FISTULA ;  Surgeon: Conrad Bath, MD;  Location: [place];  Service: Vascular;  Laterality: Left;   CHOLECYSTECTOMY  11/24/2004   INSERTION OF DIALYSIS CATHETER Right 03/17/2015   Procedure: INSERTION OF DIALYSIS CATHETER RIGHT INTERNAL JUIGULAR PLACEMENT;  Surgeon: Conrad [place], MD;  Location: [place];  Service: Vascular;  Laterality: Right;   KIDNEY TRANSPLANT Right 11/2019    Current Outpatient Medications  Medication Instructions   amLODipine (NORVASC) 5 mg, Oral, Daily   aripiprazole (ABILIFY) 10 mg, Oral, Daily at bedtime   aspirin EC 81 mg, Oral, Daily, Swallow whole.    atorvastatin (LIPITOR) 10 mg, Oral, Daily   busPIRone (BUSPAR) 10 mg, Oral, 3 times daily   carvedilol (COREG) 12.5 MG tablet TAKE 1 TABLET (12.5MG TOTAL) BY MOUTH TWICE A DAY WITH MEALS   chlorthalidone (HYGROTON) 25 MG  tablet 12.5 tablets, Oral, Daily   ferrous sulfate 325 mg, Oral, 2 times daily   hydrOXYzine (ATARAX) 25 mg, Oral, 3 times daily PRN   lamoTRIgine (LAMICTAL) 100-200 mg, Oral, See admin instructions, Takes 210m  tablet at night and 100 mg tablet in the AM   levothyroxine (SYNTHROID) 112 MCG tablet TAKE 1 TABLET BY MOUTH EVERY DAY BEFORE BREAKFAST   mycophenolate (MYFORTIC) 450 mg, Oral, 2 times daily, 3 tabs in the morning and 3 tabs in the evening   omeprazole (PRILOSEC) 20 MG capsule TAKE 1 CAPSULE (20 MG TOTAL) BY MOUTH 2 (TWO) TIMES DAILY  BEFORE A MEAL. KEEP DEC. APPOINTMENT   polyethylene glycol powder (GLYCOLAX/MIRALAX) 17 g, Oral, Daily PRN, Reported on 05/26/2015   predniSONE (DELTASONE) 5 mg, Oral, Daily   sennosides-docusate sodium (SENOKOT-S) 8.6-50 MG tablet 1 tablet, Oral, Daily   sertraline (ZOLOFT) 100 mg, Oral, Daily   sulfamethoxazole-trimethoprim (BACTRIM) 400-80 MG tablet 1 tablet, Oral, 3 times weekly, M-W-F   tacrolimus ER (ENVARSUS XR) 4 mg, Oral, See admin instructions, Take with (3) 1 mg for a total of 7 mg daily    tacrolimus ER (ENVARSUS XR) 3 mg, Oral, See admin instructions, Take with the 4 mg for a total of 7 mg daily      Social History:  The patient  reports that she has never smoked. She has never used smokeless tobacco. She reports that she does not drink alcohol and does not use drugs.   Family History:  The patient's family history includes Bladder Cancer in her maternal grandmother; Cancer in her maternal grandfather; Diabetes in her maternal grandfather and maternal grandmother; Heart disease in her maternal grandmother; Hypertension in her maternal grandmother and mother.  ROS:  Please see the history of present illness. All other systems are reviewed and otherwise negative.   PHYSICAL EXAM:  VS:  BP 124/78   Pulse 90   Ht 5' 4"$  (1.626 m)   Wt 228 lb (103.4 kg)   LMP  (LMP Unknown)   SpO2 97%   BMI 39.14 kg/m  BMI: Body mass index is 39.14 kg/m.  GEN- The patient is well appearing, alert and oriented x 3 today.   HEENT: normocephalic, atraumatic; sclera clear, conjunctiva pink; hearing intact; oropharynx clear; neck supple, no JVP Lungs- Clear to ausculation bilaterally, normal work of breathing.  No wheezes, rales, rhonchi Heart- Regular rate and rhythm, no murmurs, rubs or gallops, PMI not laterally displaced GI- soft, non-tender, non-distended, bowel sounds present, no hepatosplenomegaly Extremities- No peripheral edema. no clubbing or cyanosis; DP/PT/radial pulses 2+  bilaterally MS- no significant deformity or atrophy Skin- warm and dry, no rash or lesion Psych- euthymic mood, full affect Neuro- strength and sensation are intact   EKG is not ordered.   Recent Labs: 04/09/2022: BUN 16; Creatinine, Ser 1.40; Hemoglobin 11.9; Potassium 4.6; Sodium 137  No results found for requested labs within last 365 days.   CrCl cannot be calculated (Patient's most recent lab result is older than the maximum 21 days allowed.).   Wt Readings from Last 3 Encounters:  06/11/22 228 lb (103.4 kg)  04/09/22 222 lb (100.7 kg)  04/01/22 223 lb (101.2 kg)     Additional studies reviewed include: Previous EP, cardiology notes.   TTE 04/16/22  1. Left ventricular ejection fraction, by estimation, is 60 to 65%. The left ventricle has normal function. The left ventricle has no regional wall motion abnormalities. Left ventricular diastolic parameters were normal.   2. Right ventricular  systolic function is normal. The right ventricular size is normal.   3. The mitral valve is normal in structure. Mild mitral valve regurgitation. No evidence of mitral stenosis.   4. The aortic valve is tricuspid. Aortic valve regurgitation is not visualized. No aortic stenosis is present.   5. The inferior vena cava is normal in size with greater than 50% respiratory variability, suggesting right atrial pressure of 3 mmHg.   ETT 05/15/2020 Heart rate at rest 108 bpm, at peak stress 139 bpm There was no ST segment deviation noted during stress. No electrocardiographic evidence of ischemia noted at heart rate achieved (75%). She was not able to achieve target heart rate secondary to fatigue and shortness of breath.  CT chest, abd w/o contrast, 10/14/2020 1. No acute findings in the chest or abdomen. 2. Mild cardiomegaly without pleural effusion or pulmonary edema. 3. Partially visualized right lower quadrant transplant kidney. No hydronephrosis. 4. S-shaped scoliotic curvature of the  thoracolumbar spine.  ASSESSMENT AND PLAN:  #) atypical chest pain Reassurance provided that cardiac workup thus far has not revealed a cardiac cause of her CP. Echo, ETT, CT chest w/o all grossly normal Recommended she follow-up with PCP to ensure GERD, scoliosis, HTN, and anxiety are well-controlled as they all may be contributing to CP. Patient and mother are agreeable to this. Could consider coronary CTA with contrast, but I am hesitant to do that with ESRD/kidney transplant at this juncture.  - would need renal clearance prior to scheduling  #) HTN Well-controlled today  Current medicines are reviewed at length with the patient today.   The patient does not have concerns regarding her medicines.  The following changes were made today:  none  Labs/ tests ordered today include:  No orders of the defined types were placed in this encounter.    Disposition: Follow up with EP APP in  PRN    Signed, Mamie Levers, NP  06/11/22  10:18 AM  Electrophysiology [facility]

## 2022-06-11 ENCOUNTER — Encounter: Payer: Self-pay | Admitting: Cardiology

## 2022-06-11 ENCOUNTER — Ambulatory Visit: Payer: Medicare Other | Attending: Physician Assistant | Admitting: Cardiology

## 2022-06-11 VITALS — BP 124/78 | HR 90 | Ht 64.0 in | Wt 228.0 lb

## 2022-06-11 DIAGNOSIS — R0789 Other chest pain: Secondary | ICD-10-CM | POA: Insufficient documentation

## 2022-06-11 DIAGNOSIS — I10 Essential (primary) hypertension: Secondary | ICD-10-CM | POA: Insufficient documentation

## 2022-06-11 NOTE — Patient Instructions (Signed)
Medication Instructions:    Your physician recommends that you continue on your current medications as directed. Please refer to the Current Medication list given to you today.   *If you need a refill on your cardiac medications before your next appointment, please call your pharmacy*   Lab Work: [place]    If you have labs (blood work) drawn today and your tests are completely normal, you will receive your results only by: [place] (if you have MyChart) OR A paper copy in the mail If you have any lab test that is abnormal or we need to change your treatment, we will call you to review the results.   Testing/Procedures: NONE ORDERED  TODAY   Follow-Up: At [facility], you and your health needs are our priority.  As part of our continuing mission to provide you with exceptional heart care, we have created designated Provider Care Teams.  These Care Teams include your primary Cardiologist (physician) and Advanced Practice Providers (APPs -  Physician Assistants and Nurse Practitioners) who all work together to provide you with the care you need, when you need it.  We recommend signing up for the patient portal called "MyChart".  Sign up information is provided on this After Visit Summary.  MyChart is used to connect with patients for Virtual Visits (Telemedicine).  Patients are able to view lab/test results, encounter notes, upcoming appointments, etc.  Non-urgent messages can be sent to your provider as well.   To learn more about what you can do with MyChart, go to [email or web address].    Your next appointment:  CONTACT [facility] [phone] AS NEEDED FOR  ANY CARDIAC RELATED SYMPTOMS   Other Instructions

## 2022-06-13 ENCOUNTER — Other Ambulatory Visit: Payer: Self-pay | Admitting: Gastroenterology

## 2022-06-27 ENCOUNTER — Other Ambulatory Visit: Payer: Self-pay | Admitting: Family Medicine

## 2022-07-12 ENCOUNTER — Telehealth: Payer: Self-pay | Admitting: Gastroenterology

## 2022-07-12 NOTE — Telephone Encounter (Signed)
Patient called in to request a refill for omeprazole  medication.Please advise

## 2022-07-26 ENCOUNTER — Other Ambulatory Visit: Payer: Self-pay | Admitting: Family Medicine

## 2022-07-26 DIAGNOSIS — E039 Hypothyroidism, unspecified: Secondary | ICD-10-CM

## 2022-08-22 ENCOUNTER — Other Ambulatory Visit: Payer: Self-pay | Admitting: Family Medicine

## 2022-08-22 DIAGNOSIS — E039 Hypothyroidism, unspecified: Secondary | ICD-10-CM

## 2022-09-04 ENCOUNTER — Ambulatory Visit
Admission: EM | Admit: 2022-09-04 | Discharge: 2022-09-04 | Disposition: A | Discharge status: Home / Self Care | Payer: Medicare Other | Attending: Urgent Care | Admitting: Urgent Care

## 2022-09-04 DIAGNOSIS — J029 Acute pharyngitis, unspecified: Secondary | ICD-10-CM

## 2022-09-04 DIAGNOSIS — R112 Nausea with vomiting, unspecified: Secondary | ICD-10-CM | POA: Diagnosis not present

## 2022-09-04 DIAGNOSIS — R051 Acute cough: Secondary | ICD-10-CM

## 2022-09-04 LAB — POCT RAPID STREP A (OFFICE): Rapid Strep A Screen: NEGATIVE

## 2022-09-04 MED ORDER — ONDANSETRON 4 MG PO TBDP: 4.0000 mg | ORAL_TABLET | Freq: Three times a day (TID) | ORAL | 0 refills | Status: AC | PRN

## 2022-09-04 MED ORDER — BENZONATATE 100 MG PO CAPS: 100.0000 mg | ORAL_CAPSULE | Freq: Three times a day (TID) | ORAL | 0 refills | Status: AC | PRN

## 2022-09-04 NOTE — ED Triage Notes (Signed)
Pt presents with a sore throat that started 2 days ago and a cough that started 3 days ago.

## 2022-09-04 NOTE — ED Provider Notes (Signed)
[facility]    CSN: [id number] Arrival date & time: 09/04/22  8657      History   Chief Complaint Chief Complaint  Patient presents with   Cough   Sore Throat    HPI Vanessa Alvarez is a 37 y.o. female.   37 year old female presents today due to concerns of a sore throat.  States she has had a dry cough intermittently for the past 2 days, sore throat for a bit longer than that.  States her nephew was recently sick with upper respiratory symptoms.  Has had a temperature of 99.1 consistently per mom.  She did try Tylenol 1 time.  Patient did vomit at dinner last night, reports some nausea.  Patient denies any additional upper respiratory symptoms. No rash. No GI at present time.   Cough Sore Throat    Past Medical History:  Diagnosis Date   Anemia    Iron Def   Anxiety    Autism spectrum    Behavioral problems    Depression    End stage renal disease (HCC)    ESRD on dialysis (HCC)    ESRD on HD dx 2016. Sees Dr Arrie Aran with [facility].  Tues, Thurs, Sat schedule.     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   Immunosuppression (HCC) 12/03/2019   Left-sided chest pain 08/23/2019   Chronic idiopathic constipation 06/15/2019   Intellectual disability 02/01/2018   Neck pain 05/04/2017   GERD (gastroesophageal reflux disease) 05/04/2017   Paranoia (HCC) 10/16/2015   Hyperphosphatemia 07/11/2015   Affective disorder (HCC) 07/06/2015   Coagulation defect, unspecified  (HCC) 03/18/2015   Generalized anxiety disorder 03/18/2015   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   Nystagmus 08/22/2013   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   Headache 07/13/2011   ECZEMA, ATOPIC 11/05/2009   Hypothyroidism 03/12/2009   Obesity, Class II, BMI 35-39.9 02/26/2009   Essential hypertension 02/26/2009   Iron deficiency anemia 05/20/2008   Anxiety state 06/23/2006   MENTAL RETARDATION 06/23/2006    Past Surgical History:  Procedure Laterality Date   A/V FISTULAGRAM Left 04/09/2022   Procedure: A/V Fistulagram;  Surgeon: Chuck Hint, MD;  Location: [facility] INVASIVE CV LAB;  Service: Cardiovascular;  Laterality: Left;   AV FISTULA PLACEMENT Left 03/17/2015   Procedure: CREATION OF LEFT BRACHIOCEPHALIC ARTERIOVENOUS (AV) FISTULA ;  Surgeon: Fransisco Hertz, MD;  Location: [facility] OR;  Service: Vascular;  Laterality: Left;   CHOLECYSTECTOMY  11/24/2004   INSERTION OF DIALYSIS CATHETER Right 03/17/2015   Procedure: INSERTION OF DIALYSIS CATHETER RIGHT INTERNAL JUIGULAR PLACEMENT;  Surgeon: Fransisco Hertz, MD;  Location: [facility] OR;  Service: Vascular;  Laterality: Right;   KIDNEY TRANSPLANT Right 11/2019    OB History     Gravida  0   Para  0   Term  0   Preterm  0   AB  0   Living  0  SAB  0   IAB  0   Ectopic  0   Multiple  0   Live Births  0            Home Medications    Prior to Admission medications   Medication Sig Start Date End Date Taking? Authorizing Provider  benzonatate (TESSALON) 100 MG capsule Take 1 capsule (100 mg total) by mouth 3 (three) times daily as needed for cough. 09/04/22  Yes Neelie Welshans L, PA  ondansetron (ZOFRAN-ODT) 4 MG disintegrating tablet Take 1 tablet (4 mg total) by mouth every 8 (eight) hours as needed for nausea or vomiting. 09/04/22  Yes Sirius Woodford L, PA  amLODipine (NORVASC) 10 MG tablet Take 5 mg by mouth daily. 10/29/20   [provider]  aripiprazole (ABILIFY) 10 MG disintegrating tablet  Take 10 mg by mouth at bedtime.    [provider]  aspirin EC 81 MG tablet Take 81 mg by mouth daily. Swallow whole.    [provider]  atorvastatin (LIPITOR) 10 MG tablet Take 10 mg by mouth daily. 05/27/22   [provider]  busPIRone (BUSPAR) 10 MG tablet Take 10 mg by mouth 3 (three) times daily.    [provider]  carvedilol (COREG) 12.5 MG tablet TAKE 1 TABLET (12.5MG  TOTAL) BY MOUTH TWICE A DAY WITH MEALS 06/28/22   Littie Deeds, MD  chlorthalidone (HYGROTON) 25 MG tablet Take 12.5 tablets by mouth daily. 02/07/22   [provider]  ferrous sulfate 325 (65 FE) MG tablet Take 1 tablet (325 mg total) by mouth 2 (two) times daily. Patient taking differently: Take 325 mg by mouth daily with breakfast. 01/07/12   Durwin Reges, MD  hydrOXYzine (ATARAX/VISTARIL) 25 MG tablet Take 25 mg by mouth 3 (three) times daily as needed for anxiety.    [provider]  lamoTRIgine (LAMICTAL) 200 MG tablet Take 100-200 mg by mouth See admin instructions. Takes 200mg   tablet at night and 100 mg tablet in the AM    [provider]  levothyroxine (SYNTHROID) 112 MCG tablet TAKE 1 TABLET BY MOUTH EVERY DAY BEFORE BREAKFAST 08/23/22   Littie Deeds, MD  mycophenolate (MYFORTIC) 180 MG EC tablet Take 450 mg by mouth 2 (two) times daily. 3 tabs in the morning and 3 tabs in the evening    [provider]  omeprazole (PRILOSEC) 20 MG capsule TAKE 1 CAPSULE (20 MG TOTAL) BY MOUTH 2 (TWO) TIMES DAILY BEFORE A MEAL. KEEP DEC. APPOINTMENT 04/09/22   Armbruster, Willaim Rayas, MD  polyethylene glycol powder (GLYCOLAX/MIRALAX) 17 GM/SCOOP powder Take 17 g by mouth daily as needed for mild constipation. Reported on 05/26/2015 06/15/19   Meccariello, Solmon Ice, MD  predniSONE (DELTASONE) 5 MG tablet Take 5 mg by mouth daily.    [provider]  sennosides-docusate sodium (SENOKOT-S) 8.6-50 MG tablet Take 1 tablet by mouth daily.    [provider]   sertraline (ZOLOFT) 100 MG tablet Take 100 mg by mouth daily.     [provider]  sulfamethoxazole-trimethoprim (BACTRIM) 400-80 MG tablet Take 1 tablet by mouth 3 (three) times a week. M-W-F    [provider]  tacrolimus ER (ENVARSUS XR) 1 MG TB24 Take 3 mg by mouth See admin instructions. Take with the 4 mg for a total of 7 mg daily    [provider]  tacrolimus ER (ENVARSUS XR) 4 MG TB24 Take 4 mg by mouth See admin instructions. Take with (3) 1  mg for a total of 7 mg daily    [provider]    Family History Family History  Problem Relation Age of Onset   Hypertension Mother    Diabetes Maternal Grandmother    Hypertension Maternal Grandmother    Heart disease Maternal Grandmother    Bladder Cancer Maternal Grandmother    Diabetes Maternal Grandfather    Cancer Maternal Grandfather        prostate   Colon cancer Neg Hx    Pancreatic cancer Neg Hx    Esophageal cancer Neg Hx    Liver disease Neg Hx    Stomach cancer Neg Hx    Rectal cancer Neg Hx     Social History Social History   Tobacco Use   Smoking status: Never   Smokeless tobacco: Never  Vaping Use   Vaping Use: Never used  Substance Use Topics   Alcohol use: No    Alcohol/week: 0.0 standard drinks of alcohol   Drug use: No     Allergies   Patient has no known allergies.   Review of Systems Review of Systems  Respiratory:  Positive for cough.   As per HPI   Physical Exam Triage Vital Signs ED Triage Vitals  Enc Vitals Group     BP 09/04/22 0949 115/74     Pulse Rate 09/04/22 0949 89     Resp 09/04/22 0949 18     Temp 09/04/22 0949 99 F (37.2 C)     Temp src --      SpO2 09/04/22 0949 94 %     Weight --      Height --      Head Circumference --      Peak Flow --      Pain Score 09/04/22 0953 0     Pain Loc --      Pain Edu? --      Excl. in GC? --    No data found.  Updated Vital Signs BP 115/74   Pulse 89   Temp 99 F (37.2 C)   Resp 18    LMP  (LMP Unknown)   SpO2 94%   Visual Acuity Right Eye Distance:   Left Eye Distance:   Bilateral Distance:    Right Eye Near:   Left Eye Near:    Bilateral Near:     Physical Exam Vitals and nursing note reviewed.  Constitutional:      General: She is not in acute distress.    Appearance: She is well-developed. She is obese. She is not ill-appearing, toxic-appearing or diaphoretic.  HENT:     Head: Normocephalic and atraumatic.     Right Ear: Tympanic membrane and ear canal normal. No drainage, swelling or tenderness. No middle ear effusion. Tympanic membrane is not erythematous.     Left Ear: Tympanic membrane and ear canal normal. No drainage, swelling or tenderness.  No middle ear effusion. Tympanic membrane is not erythematous.     Nose: Rhinorrhea present. No congestion.     Mouth/Throat:     Mouth: Mucous membranes are moist. No oral lesions.     Pharynx: Oropharynx is clear. Uvula midline. Posterior oropharyngeal erythema present. No pharyngeal swelling, oropharyngeal exudate or uvula swelling.     Tonsils: No tonsillar exudate or tonsillar abscesses.  Eyes:     Conjunctiva/sclera: Conjunctivae normal.     Pupils: Pupils are equal, round, and reactive to light.     Comments: strabismus  Cardiovascular:  Rate and Rhythm: Normal rate and regular rhythm.     Heart sounds: No murmur heard. Pulmonary:     Effort: Pulmonary effort is normal. No respiratory distress.     Breath sounds: Normal breath sounds. No stridor. No wheezing, rhonchi or rales.  Chest:     Chest wall: No tenderness.  Musculoskeletal:        General: No swelling.     Cervical back: Normal range of motion and neck supple.  Lymphadenopathy:     Cervical: No cervical adenopathy.  Skin:    General: Skin is warm and dry.     Capillary Refill: Capillary refill takes less than 2 seconds.  Neurological:     Mental Status: She is alert and oriented to person, place, and time.  Psychiatric:         Mood and Affect: Mood normal.      UC Treatments / Results  Labs (all labs ordered are listed, but only abnormal results are displayed) Labs Reviewed  POCT RAPID STREP A (OFFICE)    EKG   Radiology No results found.  Procedures Procedures (including critical care time)  Medications Ordered in UC Medications - No data to display  Initial Impression / Assessment and Plan / UC Course  I have reviewed the triage vital signs and the nursing notes.  Pertinent labs & imaging results that were available during my care of the patient were reviewed by me and considered in my medical decision making (see chart for details).     Acute pharyngitis - strep negative. Will send throat culture for confirmation. Suspect viral cause. Supportive measures appropriate. Acute cough - PRN tessalon pearles. VSS, lungs CTA Nausea with vomiting x1 episode - PRN zofran. Pt deferred need for zofran in clinic today.   Final Clinical Impressions(s) / UC Diagnoses   Final diagnoses:  Acute pharyngitis, unspecified etiology  Acute cough  Nausea and vomiting, unspecified vomiting type     Discharge Instructions      Your sore throat is likely related to a virus.  Your strep test is negative.  We will call with the results of the throat swab if it requires treatment with antibiotics.  Please alternate tylenol with ibuprofen to help with the discomfort or fever.  You may also try chloraseptic spray or cepacol lozenges. I have called in zofran that you may place under your tongue to help with nausea or vomiting. Take every 8 hours as needed.  I have also called in tessalon pearles to help with cough. Take every 8 hours as needed.  Monitor for fever >100.3, swollen lymph nodes or white spots on the back of the throat which may indicate a need to return to our clinic.      ED Prescriptions     Medication Sig Dispense Auth. Provider   ondansetron (ZOFRAN-ODT) 4 MG disintegrating tablet  Take 1 tablet (4 mg total) by mouth every 8 (eight) hours as needed for nausea or vomiting. 20 tablet Braelynn Benning L, PA   benzonatate (TESSALON) 100 MG capsule Take 1 capsule (100 mg total) by mouth 3 (three) times daily as needed for cough. 20 capsule Zynia Wojtowicz L, PA      PDMP not reviewed this encounter.   Maretta Bees, [place] 09/04/22 1356

## 2022-09-04 NOTE — Discharge Instructions (Signed)
Your sore throat is likely related to a virus.  Your strep test is negative.  We will call with the results of the throat swab if it requires treatment with antibiotics.  Please alternate tylenol with ibuprofen to help with the discomfort or fever.  You may also try chloraseptic spray or cepacol lozenges. I have called in zofran that you may place under your tongue to help with nausea or vomiting. Take every 8 hours as needed.  I have also called in tessalon pearles to help with cough. Take every 8 hours as needed.  Monitor for fever >100.3, swollen lymph nodes or white spots on the back of the throat which may indicate a need to return to our clinic.

## 2022-09-23 ENCOUNTER — Other Ambulatory Visit: Payer: Self-pay | Admitting: Family Medicine

## 2022-09-23 DIAGNOSIS — E039 Hypothyroidism, unspecified: Secondary | ICD-10-CM

## 2022-09-30 ENCOUNTER — Ambulatory Visit (INDEPENDENT_AMBULATORY_CARE_PROVIDER_SITE_OTHER): Payer: Medicare Other | Admitting: Gastroenterology

## 2022-09-30 ENCOUNTER — Encounter: Payer: Self-pay | Admitting: Gastroenterology

## 2022-09-30 VITALS — BP 122/70 | HR 84 | Ht 64.0 in | Wt 226.0 lb

## 2022-09-30 DIAGNOSIS — K219 Gastro-esophageal reflux disease without esophagitis: Secondary | ICD-10-CM | POA: Diagnosis not present

## 2022-09-30 DIAGNOSIS — Z79899 Other long term (current) drug therapy: Secondary | ICD-10-CM

## 2022-09-30 MED ORDER — OMEPRAZOLE 20 MG PO CPDR: DELAYED_RELEASE_CAPSULE | ORAL | 3 refills | Status: DC

## 2022-09-30 NOTE — Progress Notes (Signed)
HPI :  37 y/o female with a history of renal transplant, GERD, here for follow-up visit, accompanied by her mother.  She has a history of longstanding reflux.  Symptoms of mainly pyrosis.  She had an EGD with me in April 2022.  Small hiatal hernia noted, no esophagitis.  She has responded well to PPI over the past 2 years.  Chronically on omeprazole 20 mg twice daily in recent years.  She states this has worked really well to control her symptoms and she does not have any breakthrough on the regimen.  She apparently ran out of it a few months ago, called for an appointment and was not offered a refill.  Unfortunately she has been off meds for a few months now and states she has daily reflux symptoms that have really bothered her.  She denies any dysphagia.  Otherwise eating well.  She does have a history of renal transplant and followed closely by nephrology, states she is doing well in this regard.  No history of osteoporosis.  We discussed the medications and her course over time.  She wishes to get back on therapy due to ongoing symptoms.    CT C/A/P 10/14/20 - IMPRESSION: 1. No acute findings in the chest or abdomen. 2. Mild cardiomegaly without pleural effusion or pulmonary edema. 3. Partially visualized right lower quadrant transplant kidney. No hydronephrosis. 4. S-shaped scoliotic curvature of the thoracolumbar spine.     Past Medical History:  Diagnosis Date   Anemia    Iron Def   Anxiety    Autism spectrum    Behavioral problems    Depression    End stage renal disease (HCC)    ESRD on dialysis (HCC)    ESRD on HD dx 2016. Sees Dr Arrie Aran with [facility].  Tues, Thurs, Sat schedule.     GERD (gastroesophageal reflux disease)    Hyperlipidemia    Hypertension    HYPOTHYROIDISM 03/12/2009   Qualifier: Diagnosis of  By: Ta MD, Cat     MENTAL RETARDATION 06/23/2006   Qualifier: Diagnosis of  By: Abundio Miu     Mood disorder [facility])    Sees Dr Mila Homer,  who prescribes meds   Neck pain 05/04/2017   Rectal bleeding 03/04/2015   Shortness of breath dyspnea    with exertion     Past Surgical History:  Procedure Laterality Date   A/V FISTULAGRAM Left 04/09/2022   Procedure: A/V Fistulagram;  Surgeon: Chuck Hint, MD;  Location: [facility] INVASIVE CV LAB;  Service: Cardiovascular;  Laterality: Left;   AV FISTULA PLACEMENT Left 03/17/2015   Procedure: CREATION OF LEFT BRACHIOCEPHALIC ARTERIOVENOUS (AV) FISTULA ;  Surgeon: Fransisco Hertz, MD;  Location: [facility] OR;  Service: Vascular;  Laterality: Left;   CHOLECYSTECTOMY  11/24/2004   INSERTION OF DIALYSIS CATHETER Right 03/17/2015   Procedure: INSERTION OF DIALYSIS CATHETER RIGHT INTERNAL JUIGULAR PLACEMENT;  Surgeon: Fransisco Hertz, MD;  Location: [facility] OR;  Service: Vascular;  Laterality: Right;   KIDNEY TRANSPLANT Right 11/2019   Family History  Problem Relation Age of Onset   Hypertension Mother    Diabetes Maternal Grandmother    Hypertension Maternal Grandmother    Heart disease Maternal Grandmother    Bladder Cancer Maternal Grandmother    Diabetes Maternal Grandfather    Cancer Maternal Grandfather        prostate   Colon cancer Neg Hx    Pancreatic cancer Neg Hx    Esophageal cancer Neg Hx  Liver disease Neg Hx    Stomach cancer Neg Hx    Rectal cancer Neg Hx    Social History   Tobacco Use   Smoking status: Never   Smokeless tobacco: Never  Vaping Use   Vaping Use: Never used  Substance Use Topics   Alcohol use: No    Alcohol/week: 0.0 standard drinks of alcohol   Drug use: No   Current Outpatient Medications  Medication Sig Dispense Refill   amLODipine (NORVASC) 10 MG tablet Take 5 mg by mouth daily.     aripiprazole (ABILIFY) 10 MG disintegrating tablet Take 10 mg by mouth at bedtime.     aspirin EC 81 MG tablet Take 81 mg by mouth daily. Swallow whole.     atorvastatin (LIPITOR) 10 MG tablet Take 10 mg by mouth daily.     benzonatate (TESSALON) 100 MG capsule Take  1 capsule (100 mg total) by mouth 3 (three) times daily as needed for cough. 20 capsule 0   busPIRone (BUSPAR) 10 MG tablet Take 10 mg by mouth 3 (three) times daily.     carvedilol (COREG) 12.5 MG tablet TAKE 1 TABLET (12.5MG  TOTAL) BY MOUTH TWICE A DAY WITH MEALS 60 tablet 2   chlorthalidone (HYGROTON) 25 MG tablet Take 12.5 tablets by mouth daily.     ferrous sulfate 325 (65 FE) MG tablet Take 1 tablet (325 mg total) by mouth 2 (two) times daily. (Patient taking differently: Take 325 mg by mouth daily with breakfast.) 180 tablet 3   hydrOXYzine (ATARAX/VISTARIL) 25 MG tablet Take 25 mg by mouth 3 (three) times daily as needed for anxiety.     lamoTRIgine (LAMICTAL) 200 MG tablet Take 100-200 mg by mouth See admin instructions. Takes 200mg   tablet at night and 100 mg tablet in the AM     levothyroxine (SYNTHROID) 112 MCG tablet TAKE 1 TABLET BY MOUTH EVERY DAY BEFORE BREAKFAST 30 tablet 0   mycophenolate (MYFORTIC) 180 MG EC tablet Take 450 mg by mouth 2 (two) times daily. 3 tabs in the morning and 3 tabs in the evening     omeprazole (PRILOSEC) 20 MG capsule TAKE 1 CAPSULE (20 MG TOTAL) BY MOUTH 2 (TWO) TIMES DAILY BEFORE A MEAL. KEEP DEC. APPOINTMENT 60 capsule 0   ondansetron (ZOFRAN-ODT) 4 MG disintegrating tablet Take 1 tablet (4 mg total) by mouth every 8 (eight) hours as needed for nausea or vomiting. 20 tablet 0   polyethylene glycol powder (GLYCOLAX/MIRALAX) 17 GM/SCOOP powder Take 17 g by mouth daily as needed for mild constipation. Reported on 05/26/2015 255 g 3   predniSONE (DELTASONE) 5 MG tablet Take 5 mg by mouth daily.     sennosides-docusate sodium (SENOKOT-S) 8.6-50 MG tablet Take 1 tablet by mouth daily.     sertraline (ZOLOFT) 100 MG tablet Take 100 mg by mouth daily.      sulfamethoxazole-trimethoprim (BACTRIM) 400-80 MG tablet Take 1 tablet by mouth 3 (three) times a week. M-W-F     tacrolimus ER (ENVARSUS XR) 1 MG TB24 Take 3 mg by mouth See admin instructions. Take with the 4  mg for a total of 7 mg daily     tacrolimus ER (ENVARSUS XR) 4 MG TB24 Take 4 mg by mouth See admin instructions. Take with (3) 1 mg for a total of 7 mg daily     Current Facility-Administered Medications  Medication Dose Route Frequency Provider Last Rate Last Admin   diclofenac Sodium (VOLTAREN) 1 % topical gel 4 g  4 g Topical QID PRN Littie Deeds, MD       No Known Allergies   Review of Systems: All systems reviewed and negative except where noted in HPI.   Lab Results  Component Value Date   CREATININE 1.40 (H) 04/09/2022   BUN 16 04/09/2022   NA 137 04/09/2022   K 4.6 04/09/2022   CL 101 04/09/2022   CO2 29 05/04/2017     Physical Exam: BP 122/70 (BP Location: Right Arm, Patient Position: Sitting, Cuff Size: Large)   Pulse 84   Ht 5\' 4"  (1.626 m)   Wt 226 lb (102.5 kg)   LMP 09/03/2022   BMI 38.79 kg/m  Constitutional: Pleasant,well-developed, female in no acute distress. Psychiatric: Normal mood and affect. Behavior is normal.   ASSESSMENT: 37 y.o. female here for assessment of the following  1. Gastroesophageal reflux disease, unspecified whether esophagitis present   2. Long-term current use of proton pump inhibitor therapy    Had a good discussion with the patient and her mother about her reflux regimen.  Longstanding reflux, prior EGD showed no Barrett's, small hiatal hernia.  She responds quite well to PPI.  We discussed long-term use of PPI and associated risks.  She does have a renal transplant and is followed closely by nephrology, they will monitor her renal function.  She understands risks of PPI but willing to accept that as they control her reflux symptoms and improve her quality of life.  Long-term want to use the lowest dose of PPI needed to control symptoms.  Will refill her omeprazole, she will try taking it at 20 mg once a day for a few weeks to see if that works well enough to control her symptoms entirely.  If so she will continue with once daily  dosing.  However if she needs twice daily dosing to control symptoms she will do so.  She can follow-up in 1 to 2 years for reassessment or sooner with any issues.  PLAN: - refilled omeprazole 20mg  once to twice daily  - counseled on long term risks/ benefits of chronic PPI use - she and mother understand - f/u 1-2 years for reassessment  Harlin Rain, MD [facility]   Littie Deeds, MD

## 2022-09-30 NOTE — Patient Instructions (Addendum)
We have sent the following medications to your pharmacy for you to pick up at your convenience: Omeprazole 20 mg: Take once to twice daily  Please follow up in 1 year.  Thank you for entrusting me with your care and for choosing [facility], Dr. Ileene Patrick  If your blood pressure at your visit was 140/90 or greater, please contact your primary care physician to follow up on this. ______________________________________________________  If you are age 79 or older, your body mass index should be between 23-30. Your Body mass index is 38.79 kg/m. If this is out of the aforementioned range listed, please consider follow up with your Primary Care Provider.  If you are age 63 or younger, your body mass index should be between 19-25. Your Body mass index is 38.79 kg/m. If this is out of the aformentioned range listed, please consider follow up with your Primary Care Provider.  ________________________________________________________  The [facility] providers would like to encourage you to use [facility] to communicate with providers for non-urgent requests or questions.  Due to long hold times on the telephone, sending your provider a message by [facility] may be a faster and more efficient way to get a response.  Please allow 48 business hours for a response.  Please remember that this is for non-urgent requests.  _______________________________________________________  Due to recent changes in healthcare laws, you may see the results of your imaging and laboratory studies on MyChart before your provider has had a chance to review them.  We understand that in some cases there may be results that are confusing or concerning to you. Not all laboratory results come back in the same time frame and the provider may be waiting for multiple results in order to interpret others.  Please give [place] 48 hours in order for your provider to thoroughly review all the results before contacting the office for  clarification of your results.

## 2022-10-22 ENCOUNTER — Other Ambulatory Visit: Payer: Self-pay | Admitting: Family Medicine

## 2022-10-22 DIAGNOSIS — E039 Hypothyroidism, unspecified: Secondary | ICD-10-CM

## 2022-10-25 ENCOUNTER — Ambulatory Visit: Payer: Medicare Other

## 2022-10-25 ENCOUNTER — Telehealth: Payer: Self-pay

## 2022-10-25 NOTE — Telephone Encounter (Signed)
Received notification from front office staff that patient was asking to schedule appointment to follow up on chest pain and "not feeling good."   Returned call to patient and patient's mother. Reports that patient has been having intermittent chest pain and has not been feeling well for the last several months.   Denies current chest pain or shortness of breath.   Scheduled for PCP evaluation tomorrow at 3:15 pm.   ED precautions discussed. Mother verbalizes understanding.   Veronda Prude, RN

## 2022-10-26 ENCOUNTER — Encounter: Payer: Self-pay | Admitting: Family Medicine

## 2022-10-26 ENCOUNTER — Ambulatory Visit (INDEPENDENT_AMBULATORY_CARE_PROVIDER_SITE_OTHER): Payer: Medicare Other | Admitting: Family Medicine

## 2022-10-26 VITALS — BP 134/74 | HR 93 | Ht 64.0 in | Wt 227.8 lb

## 2022-10-26 DIAGNOSIS — R42 Dizziness and giddiness: Secondary | ICD-10-CM | POA: Diagnosis not present

## 2022-10-26 DIAGNOSIS — E039 Hypothyroidism, unspecified: Secondary | ICD-10-CM | POA: Diagnosis not present

## 2022-10-26 DIAGNOSIS — R079 Chest pain, unspecified: Secondary | ICD-10-CM

## 2022-10-26 DIAGNOSIS — H8190 Unspecified disorder of vestibular function, unspecified ear: Secondary | ICD-10-CM

## 2022-10-26 NOTE — Assessment & Plan Note (Signed)
Check CBC CMP and TSH to rule out anemia and electrolyte imbalances as well as check thyroid as she has a history of hypothyroid.  Because cardiology felt that this was not a cardiac cause of chest pain plan to increase her omeprazole from 20 mg to 40 mg if all tests come back normal.

## 2022-10-26 NOTE — Assessment & Plan Note (Signed)
>>  ASSESSMENT AND PLAN FOR LEFT-SIDED CHEST PAIN WRITTEN ON 10/26/2022  4:48 PM BY Caliana Spires, DO  Check CBC CMP and TSH to rule out anemia and electrolyte imbalances as well as check thyroid as she has a history of hypothyroid.  Because cardiology felt that this was not a cardiac cause of chest pain plan to increase her omeprazole from 20 mg to 40 mg if all tests come back normal.

## 2022-10-26 NOTE — Patient Instructions (Addendum)
It was wonderful to see you today.  Today we talked about:  Your chest pain. We drew labs to check for anemia, thyroid problems and electrolyte problems. We will call you with those results in the next few days. If there are no problems, we discussed increasing your omeprazole to 40 mg, once a day.  Please follow up in 3 weeks, or sooner if you have new shortness of breath or palpitations.   We also discussed your vertigo. We have ordered an MRI to evaluate, we will let you know when your appointment is scheduled.  It will take place at [facility].  Thank you for choosing [facility].   Please call [phone] with any questions about today's appointment.  Please arrive at least 15 minutes prior to your scheduled appointments.   If you had blood work today, I will send you a MyChart message or a letter if results are normal. Otherwise, I will give you a call.   If you had a referral placed, they will call you to set up an appointment. Please give [place] a call if you don't hear back in the next 2 weeks.   If you need additional refills before your next appointment, please call your pharmacy first.   Gerrit Heck, DO Family Medicine

## 2022-10-26 NOTE — Progress Notes (Signed)
    SUBJECTIVE:   CHIEF COMPLAINT / HPI:   Follow up for CP.  She was seen in February by cardiology who did not feel that she had a cardiac cause for chest pain.  It is intermittent and sharp not exacerbated by deep breathing.  She denies shortness of breath.  Notices it most when she is lying down.  She denies any heart palpitations tightness in the chest.  She also reports ongoing intermittent dizziness for the last 4 months.  It does not occur all the time and lasts an indeterminate amount of time.  She does not feel like she has any double vision or loss of balance while this is occurring.  She states the room feels like it is spinning around her.  PERTINENT  PMH / PSH: Hx of GERD, HTN, Constipation, Hypothyroid Multiple Mood meds OBJECTIVE:   BP 134/74   Pulse 93   Ht 5\' 4"  (1.626 m)   Wt 227 lb 12.8 oz (103.3 kg)   LMP  (LMP Unknown)   SpO2 97%   BMI 39.10 kg/m   Cardiovascular: Regular rate and rhythm, S1-S2 present.  No murmur gallops or rubs Respiratory: CTA bilaterally.  No wheezing rales or rhonchi Abdomen: Soft nontender nondistended.  Bowel sounds present x 4 Neuro: Memory: Intact . PEERLA. Cranial nerves: II through XII are intact. Normal visual fields bilaterally. Sensation normal upper and lower ext's bilaterally. FTN, rapid hand alternating movements normal bilaterally.  Some circular nystagmus on upward gaze testing. ASSESSMENT/PLAN:   Left-sided chest pain Check CBC CMP and TSH to rule out anemia and electrolyte imbalances as well as check thyroid as she has a history of hypothyroid.  Because cardiology felt that this was not a cardiac cause of chest pain plan to increase her omeprazole from 20 mg to 40 mg if all tests come back normal.  Intermittent vertigo Given patient's immunosuppressed status and circular nystagmus with upward gaze we have ordered an MRI with and without contrast to rule out any usual issues such as tumor or infection.  Given that this is a  chronic issue with intermittent occurrence it is unlikely that she has any life-threatening underlying causes for this condition.  She was instructed to follow-up with her primary care doctor to discuss the results.     Gerrit Heck, DO [facility]

## 2022-10-26 NOTE — Assessment & Plan Note (Signed)
Given patient's immunosuppressed status and circular nystagmus with upward gaze we have ordered an MRI with and without contrast to rule out any usual issues such as tumor or infection.  Given that this is a chronic issue with intermittent occurrence it is unlikely that she has any life-threatening underlying causes for this condition.  She was instructed to follow-up with her primary care doctor to discuss the results.

## 2022-10-27 ENCOUNTER — Telehealth: Payer: Self-pay | Admitting: Family Medicine

## 2022-10-27 ENCOUNTER — Encounter: Payer: Self-pay | Admitting: Family Medicine

## 2022-10-27 DIAGNOSIS — D509 Iron deficiency anemia, unspecified: Secondary | ICD-10-CM

## 2022-10-27 LAB — CBC WITH DIFFERENTIAL/PLATELET
Basophils Absolute: 0 10*3/uL (ref 0.0–0.2)
Basos: 0 %
EOS (ABSOLUTE): 0.1 10*3/uL (ref 0.0–0.4)
Eos: 1 %
Hematocrit: 34.7 % (ref 34.0–46.6)
Hemoglobin: 10.6 g/dL — ABNORMAL LOW (ref 11.1–15.9)
Immature Grans (Abs): 0 10*3/uL (ref 0.0–0.1)
Immature Granulocytes: 1 %
Lymphocytes Absolute: 0.6 10*3/uL — ABNORMAL LOW (ref 0.7–3.1)
Lymphs: 9 %
MCH: 23.4 pg — ABNORMAL LOW (ref 26.6–33.0)
MCHC: 30.5 g/dL — ABNORMAL LOW (ref 31.5–35.7)
MCV: 77 fL — ABNORMAL LOW (ref 79–97)
Monocytes Absolute: 0.6 10*3/uL (ref 0.1–0.9)
Monocytes: 8 %
Neutrophils Absolute: 5.8 10*3/uL (ref 1.4–7.0)
Neutrophils: 81 %
Platelets: 308 10*3/uL (ref 150–450)
RBC: 4.53 x10E6/uL (ref 3.77–5.28)
RDW: 15.8 % — ABNORMAL HIGH (ref 11.7–15.4)
WBC: 7.2 10*3/uL (ref 3.4–10.8)

## 2022-10-27 LAB — COMPREHENSIVE METABOLIC PANEL
ALT: 8 IU/L (ref 0–32)
AST: 12 IU/L (ref 0–40)
Albumin: 4.3 g/dL (ref 3.9–4.9)
Alkaline Phosphatase: 82 IU/L (ref 44–121)
BUN/Creatinine Ratio: 13 (ref 9–23)
BUN: 17 mg/dL (ref 6–20)
Bilirubin Total: <0.2 mg/dL (ref 0.0–1.2)
CO2: 20 mmol/L (ref 20–29)
Calcium: 10 mg/dL (ref 8.7–10.2)
Chloride: 102 mmol/L (ref 96–106)
Creatinine, Ser: 1.36 mg/dL — ABNORMAL HIGH (ref 0.57–1.00)
Globulin, Total: 3 g/dL (ref 1.5–4.5)
Glucose: 101 mg/dL — ABNORMAL HIGH (ref 70–99)
Potassium: 4.8 mmol/L (ref 3.5–5.2)
Sodium: 137 mmol/L (ref 134–144)
Total Protein: 7.3 g/dL (ref 6.0–8.5)
eGFR: 51 mL/min/{1.73_m2} — ABNORMAL LOW (ref >59)

## 2022-10-27 LAB — TSH: TSH: 2.5 u[IU]/mL (ref 0.450–4.500)

## 2022-10-27 NOTE — Telephone Encounter (Signed)
Called patient, spoke with her mother on the phone. Her hemoglobin was 10.6, and given that she is already on iron supplementation and has chronic immunosuppression, we discussed ordering an iron panel to further characterize her anemia. Mother voiced understanding and agreement.

## 2022-10-29 ENCOUNTER — Other Ambulatory Visit: Payer: Medicare Other

## 2022-10-29 DIAGNOSIS — D509 Iron deficiency anemia, unspecified: Secondary | ICD-10-CM

## 2022-10-30 LAB — IRON,TIBC AND FERRITIN PANEL
Ferritin: 322 ng/mL — ABNORMAL HIGH (ref 15–150)
Iron Saturation: 17 % (ref 15–55)
Iron: 43 ug/dL (ref 27–159)
Total Iron Binding Capacity: 253 ug/dL (ref 250–450)
UIBC: 210 ug/dL (ref 131–425)

## 2022-11-01 ENCOUNTER — Telehealth: Payer: Self-pay | Admitting: Family Medicine

## 2022-11-01 NOTE — Telephone Encounter (Signed)
Called and spoke with patient's mother, Solon Palm. Explained that the results of Vanessa Alvarez's iron panel were normal with the exception of her ferritin level, which is likely elevated due to her chronic kidney problems. No further action is needed at this time, mother voiced understanding and agreement with the plan to wait for the results of the MRI.

## 2022-11-01 NOTE — Telephone Encounter (Signed)
-----   Message from Leighton Roach McDiarmid, MD sent at 11/01/2022  2:41 PM EDT -----  ----- Message ----- From: Nell Range Lab Results In Sent: 10/30/2022   8:13 AM EDT To: Leighton Roach McDiarmid, MD

## 2022-11-11 ENCOUNTER — Ambulatory Visit (HOSPITAL_COMMUNITY)
Admission: RE | Admit: 2022-11-11 | Discharge: 2022-11-11 | Disposition: A | Discharge status: Home / Self Care | Payer: Medicare Other | Source: Ambulatory Visit | Attending: Family Medicine | Admitting: Family Medicine

## 2022-11-11 DIAGNOSIS — H8190 Unspecified disorder of vestibular function, unspecified ear: Secondary | ICD-10-CM | POA: Insufficient documentation

## 2022-11-11 MED ORDER — GADOBUTROL 1 MMOL/ML IV SOLN
10.0000 mL | Freq: Once | INTRAVENOUS | Status: AC | PRN
  Administered 2022-11-11: 10 mL via INTRAVENOUS

## 2022-11-29 ENCOUNTER — Other Ambulatory Visit: Payer: Self-pay

## 2022-11-29 DIAGNOSIS — E039 Hypothyroidism, unspecified: Secondary | ICD-10-CM

## 2022-11-29 MED ORDER — LEVOTHYROXINE SODIUM 112 MCG PO TABS: ORAL_TABLET | ORAL | 0 refills | Status: DC

## 2022-12-25 ENCOUNTER — Other Ambulatory Visit: Payer: Self-pay | Admitting: Family Medicine

## 2022-12-25 DIAGNOSIS — E039 Hypothyroidism, unspecified: Secondary | ICD-10-CM

## 2023-01-03 ENCOUNTER — Other Ambulatory Visit: Payer: Self-pay

## 2023-01-04 MED ORDER — CARVEDILOL 12.5 MG PO TABS: 12.5000 mg | ORAL_TABLET | Freq: Two times a day (BID) | ORAL | 2 refills | Status: DC

## 2023-01-21 ENCOUNTER — Telehealth: Payer: Self-pay

## 2023-01-21 NOTE — Telephone Encounter (Signed)
Patient's mother LVM on nurse line regarding issues with picking up carvedilol.   Called pharmacy. Pharmacist reports that medication was out of stock, however, they received new shipment of medication.   He states that they will have this ready in about one hour.   Attempted to call mother back x 2, no answer.   If mother calls back, please let her know.   Veronda Prude, RN

## 2023-01-23 ENCOUNTER — Other Ambulatory Visit: Payer: Self-pay | Admitting: Family Medicine

## 2023-01-23 DIAGNOSIS — E039 Hypothyroidism, unspecified: Secondary | ICD-10-CM

## 2023-01-28 ENCOUNTER — Ambulatory Visit (INDEPENDENT_AMBULATORY_CARE_PROVIDER_SITE_OTHER): Payer: Medicare Other

## 2023-01-28 VITALS — Ht 64.0 in | Wt 227.0 lb

## 2023-01-28 DIAGNOSIS — Z0000 Encounter for general adult medical examination without abnormal findings: Secondary | ICD-10-CM

## 2023-01-28 NOTE — Progress Notes (Signed)
Subjective:   Vanessa Alvarez is a 37 y.o. female who presents for an Initial Medicare Annual Wellness Visit.  Visit Complete: Virtual  I connected with  Vanessa Alvarez on 01/28/23 by a audio enabled telemedicine application and verified that I am speaking with the correct person using two identifiers.  Patient Location: Home  Provider Location: Home Office  I discussed the limitations of evaluation and management by telemedicine. The patient expressed understanding and agreed to proceed.  Because this visit was a virtual/telehealth visit, some criteria may be missing or patient reported. Any vitals not documented were not able to be obtained and vitals that have been documented are patient reported.   Cardiac Risk Factors include: dyslipidemia;hypertension;obesity (BMI >30kg/m2);sedentary lifestyle     Objective:    Today's Vitals   01/28/23 1417  Weight: 227 lb (103 kg)  Height: 5\' 4"  (1.626 m)   Body mass index is 38.96 kg/m.     01/28/2023    4:51 PM 03/11/2022   11:09 AM 07/07/2021    8:57 AM 01/01/2021    7:58 AM 11/13/2020   11:03 AM 07/31/2020    8:59 AM 06/13/2020    8:37 AM  Advanced Directives  Does Patient Have a Medical Advance Directive? No No No No No No No  Would patient like information on creating a medical advance directive? Yes (MAU/Ambulatory/Procedural Areas - Information given) No - Patient declined No - Patient declined No - Patient declined No - Patient declined  No - Patient declined    Current Medications (verified) Outpatient Encounter Medications as of 01/28/2023  Medication Sig   amLODipine (NORVASC) 10 MG tablet Take 5 mg by mouth daily.   aripiprazole (ABILIFY) 10 MG disintegrating tablet Take 10 mg by mouth at bedtime.   aspirin EC 81 MG tablet Take 81 mg by mouth daily. Swallow whole.   atorvastatin (LIPITOR) 10 MG tablet Take 10 mg by mouth daily.   busPIRone (BUSPAR) 10 MG tablet Take 10 mg by mouth 3 (three) times daily.   carvedilol  (COREG) 12.5 MG tablet Take 1 tablet (12.5 mg total) by mouth 2 (two) times daily with a meal.   chlorthalidone (HYGROTON) 25 MG tablet Take 12.5 tablets by mouth daily.   ferrous sulfate 325 (65 FE) MG tablet Take 1 tablet (325 mg total) by mouth 2 (two) times daily. (Patient taking differently: Take 325 mg by mouth daily with breakfast.)   hydrOXYzine (ATARAX/VISTARIL) 25 MG tablet Take 25 mg by mouth 3 (three) times daily as needed for anxiety.   lamoTRIgine (LAMICTAL) 200 MG tablet Take 100-200 mg by mouth See admin instructions. Takes 200mg   tablet at night and 100 mg tablet in the AM   levothyroxine (SYNTHROID) 112 MCG tablet TAKE 1 TABLET BY MOUTH EVERY DAY BEFORE BREAKFAST   mycophenolate (MYFORTIC) 180 MG EC tablet Take 450 mg by mouth 2 (two) times daily. 3 tabs in the morning and 3 tabs in the evening   omeprazole (PRILOSEC) 20 MG capsule Take 20 mg by mouth once to twice daily   ondansetron (ZOFRAN-ODT) 4 MG disintegrating tablet Take 1 tablet (4 mg total) by mouth every 8 (eight) hours as needed for nausea or vomiting.   polyethylene glycol powder (GLYCOLAX/MIRALAX) 17 GM/SCOOP powder Take 17 g by mouth daily as needed for mild constipation. Reported on 05/26/2015   predniSONE (DELTASONE) 5 MG tablet Take 5 mg by mouth daily.   sennosides-docusate sodium (SENOKOT-S) 8.6-50 MG tablet Take 1 tablet by mouth daily.  sertraline (ZOLOFT) 100 MG tablet Take 100 mg by mouth daily.    sulfamethoxazole-trimethoprim (BACTRIM) 400-80 MG tablet Take 1 tablet by mouth 3 (three) times a week. M-W-F   tacrolimus ER (ENVARSUS XR) 1 MG TB24 Take 3 mg by mouth See admin instructions. Take with the 4 mg for a total of 7 mg daily   tacrolimus ER (ENVARSUS XR) 4 MG TB24 Take 4 mg by mouth See admin instructions. Take with (3) 1 mg for a total of 7 mg daily   benzonatate (TESSALON) 100 MG capsule Take 1 capsule (100 mg total) by mouth 3 (three) times daily as needed for cough. (Patient not taking: Reported on  01/28/2023)   Facility-Administered Encounter Medications as of 01/28/2023  Medication   diclofenac Sodium (VOLTAREN) 1 % topical gel 4 g    Allergies (verified) Patient has no known allergies.   History: Past Medical History:  Diagnosis Date   Anemia    Iron Def   Anxiety    Autism spectrum    Behavioral problems    Depression    End stage renal disease (HCC)    ESRD on dialysis (HCC)    ESRD on HD dx 2016. Sees Dr Arrie Aran with [facility].  Tues, Thurs, Sat schedule.     GERD (gastroesophageal reflux disease)    Hyperlipidemia    Hypertension    HYPOTHYROIDISM 03/12/2009   Qualifier: Diagnosis of  By: Ta MD, Cat     MENTAL RETARDATION 06/23/2006   Qualifier: Diagnosis of  By: Abundio Miu     Mood disorder [facility])    Sees Dr Mila Homer, who prescribes meds   Neck pain 05/04/2017   Rectal bleeding 03/04/2015   Shortness of breath dyspnea    with exertion   Past Surgical History:  Procedure Laterality Date   A/V FISTULAGRAM Left 04/09/2022   Procedure: A/V Fistulagram;  Surgeon: Chuck Hint, MD;  Location: [facility] INVASIVE CV LAB;  Service: Cardiovascular;  Laterality: Left;   AV FISTULA PLACEMENT Left 03/17/2015   Procedure: CREATION OF LEFT BRACHIOCEPHALIC ARTERIOVENOUS (AV) FISTULA ;  Surgeon: Fransisco Hertz, MD;  Location: [facility] OR;  Service: Vascular;  Laterality: Left;   CHOLECYSTECTOMY  11/24/2004   INSERTION OF DIALYSIS CATHETER Right 03/17/2015   Procedure: INSERTION OF DIALYSIS CATHETER RIGHT INTERNAL JUIGULAR PLACEMENT;  Surgeon: Fransisco Hertz, MD;  Location: [facility] OR;  Service: Vascular;  Laterality: Right;   KIDNEY TRANSPLANT Right 11/2019   Family History  Problem Relation Age of Onset   Hypertension Mother    Diabetes Maternal Grandmother    Hypertension Maternal Grandmother    Heart disease Maternal Grandmother    Bladder Cancer Maternal Grandmother    Diabetes Maternal Grandfather    Cancer Maternal Grandfather        prostate   Colon  cancer Neg Hx    Pancreatic cancer Neg Hx    Esophageal cancer Neg Hx    Liver disease Neg Hx    Stomach cancer Neg Hx    Rectal cancer Neg Hx    Social History   Socioeconomic History   Marital status: Single    Spouse name: Not on file   Number of children: 0   Years of education: Not on file   Highest education level: Not on file  Occupational History   Occupation: disabled  Tobacco Use   Smoking status: Never   Smokeless tobacco: Never  Vaping Use   Vaping status: Never Used  Substance and Sexual Activity  Alcohol use: No    Alcohol/week: 0.0 standard drinks of alcohol   Drug use: No   Sexual activity: Never  Other Topics Concern   Not on file  Social History Narrative   ** Merged History Encounter **       Lives with mother.   Very sedentary livestyle.   No etoh, tobacco, drugs, intercourse.      MR.    Social Determinants of Health   Financial Resource Strain: Not on file  Food Insecurity: Not on file  Transportation Needs: No Transportation Needs (01/28/2023)   PRAPARE - Administrator, Civil Service (Medical): No    Lack of Transportation (Non-Medical): No  Physical Activity: Inactive (01/28/2023)   Exercise Vital Sign    Days of Exercise per Week: 0 days    Minutes of Exercise per Session: 0 min  Stress: No Stress Concern Present (01/28/2023)   [employer] of Occupational Health - Occupational Stress Questionnaire    Feeling of Stress : Not at all  Social Connections: Moderately Isolated (01/28/2023)   Social Connection and Isolation Panel [NHANES]    Frequency of Communication with Friends and Family: More than three times a week    Frequency of Social Gatherings with Friends and Family: Three times a week    Attends Religious Services: 1 to 4 times per year    Active Member of Clubs or Organizations: No    Attends Banker Meetings: Never    Marital Status: Never married    Tobacco Counseling Counseling given: Not  Answered   Clinical Intake:  Pre-visit preparation completed: Yes  Pain : No/denies pain     Diabetes: No  How often do you need to have someone help you when you read instructions, pamphlets, or other written materials from your doctor or pharmacy?: 1 - Never  Interpreter Needed?: No  Comments: Assisted with visit by mother Information entered by :: Kandis Fantasia LPN   Activities of Daily Living    01/28/2023    4:50 PM  In your present state of health, do you have any difficulty performing the following activities:  Hearing? 0  Vision? 0  Difficulty concentrating or making decisions? 1  Walking or climbing stairs? 0  Dressing or bathing? 0  Doing errands, shopping? 1  Preparing Food and eating ? N  Using the Toilet? N  In the past six months, have you accidently leaked urine? N  Do you have problems with loss of bowel control? N  Managing your Medications? Y  Managing your Finances? Y  Housekeeping or managing your Housekeeping? N    Patient Care Team: Gerrit Heck, DO as PCP - General (Family Medicine) Lanier Prude, MD as PCP - Electrophysiology (Cardiology) Terrial Rhodes, MD as Consulting Physician (Nephrology)  Indicate any recent Medical Services you may have received from other than [facility] providers in the past year (date may be approximate).     Assessment:   This is a routine wellness examination for Lyn.  Hearing/Vision screen Hearing Screening - Comments:: Denies hearing difficulties   Vision Screening - Comments:: No vision problems; will schedule routine eye exam soon     Goals Addressed   None   Depression Screen    01/28/2023    4:47 PM 10/26/2022    3:10 PM 03/11/2022   11:08 AM 12/14/2021    3:09 PM 07/07/2021    9:01 AM 04/28/2021    4:04 PM 11/13/2020   11:02 AM  PHQ 2/9  Scores  PHQ - 2 Score 0 0 0 0 0 0 0  PHQ- 9 Score  2 2 0 2 1 2     Fall Risk    01/28/2023    4:49 PM 11/13/2020   11:02 AM 04/30/2020    8:51 AM  10/14/2017   10:55 AM 08/31/2017    8:29 AM  Fall Risk   Falls in the past year? 0 0 0 No No  Number falls in past yr: 0 0 0    Injury with Fall? 0 0 0    Risk for fall due to : No Fall Risks      Follow up Falls prevention discussed;Education provided;Falls evaluation completed        MEDICARE RISK AT HOME: Medicare Risk at Home Any stairs in or around the home?: No If so, are there any without handrails?: No Home free of loose throw rugs in walkways, pet beds, electrical cords, etc?: Yes Adequate lighting in your home to reduce risk of falls?: Yes Life alert?: No Use of a cane, walker or w/c?: No Grab bars in the bathroom?: No Shower chair or bench in shower?: No Elevated toilet seat or a handicapped toilet?: No  TIMED UP AND GO:  Was the test performed? No    Cognitive Function:        01/28/2023    4:51 PM  6CIT Screen  What Year? 0 points  What month? 0 points  What time? 0 points  Count back from 20 0 points  Months in reverse 2 points  Repeat phrase 4 points  Total Score 6 points    Immunizations Immunization History  Administered Date(s) Administered   Influenza Whole 05/16/2008, 03/12/2009   Influenza,inj,Quad PF,6+ Mos 02/21/2015, 02/04/2020, 03/11/2022   Influenza-Unspecified 05/28/2015, 02/25/2016, 01/13/2017, 12/25/2017, 01/24/2019, 01/24/2021   PFIZER Comirnaty(Gray Top)Covid-19 Tri-Sucrose Vaccine 07/31/2019, 09/29/2020   PFIZER(Purple Top)SARS-COV-2 Vaccination 04/21/2020   Pfizer Covid-19 Vaccine Bivalent Booster 52yrs & up 02/23/2021   Pneumococcal Conjugate-13 06/02/2017   Pneumococcal Polysaccharide-23 06/07/2015   Td 05/16/2008    TDAP status: Due, Education has been provided regarding the importance of this vaccine. Advised may receive this vaccine at local pharmacy or Health Dept. Aware to provide a copy of the vaccination record if obtained from local pharmacy or Health Dept. Verbalized acceptance and understanding.  Flu Vaccine status:  Due, Education has been provided regarding the importance of this vaccine. Advised may receive this vaccine at local pharmacy or Health Dept. Aware to provide a copy of the vaccination record if obtained from local pharmacy or Health Dept. Verbalized acceptance and understanding.  Pneumococcal vaccine status: Up to date  Covid-19 vaccine status: Information provided on how to obtain vaccines.   Qualifies for Shingles Vaccine? No    Screening Tests Health Maintenance  Topic Date Due   Hepatitis C Screening  Never done   DTaP/Tdap/Td (2 - Tdap) 05/16/2018   Cervical Cancer Screening (HPV/Pap Cotest)  09/18/2019   INFLUENZA VACCINE  11/25/2022   COVID-19 Vaccine (5 - 2023-24 season) 12/26/2022   Medicare Annual Wellness (AWV)  01/28/2024   HIV Screening  Completed   HPV VACCINES  Aged Out    Health Maintenance  Health Maintenance Due  Topic Date Due   Hepatitis C Screening  Never done   DTaP/Tdap/Td (2 - Tdap) 05/16/2018   Cervical Cancer Screening (HPV/Pap Cotest)  09/18/2019   INFLUENZA VACCINE  11/25/2022   COVID-19 Vaccine (5 - 2023-24 season) 12/26/2022    Lung Cancer Screening: (  Low Dose CT Chest recommended if Age 91-80 years, 20 pack-year currently smoking OR have quit w/in 15years.) does not qualify.   Lung Cancer Screening Referral: n/a  Additional Screening:  Hepatitis C Screening: does qualify;  Vision Screening: Recommended annual ophthalmology exams for early detection of glaucoma and other disorders of the eye. Is the patient up to date with their annual eye exam?  No  Who is the provider or what is the name of the office in which the patient attends annual eye exams? none If pt is not established with a provider, would they like to be referred to a provider to establish care? No .   Dental Screening: Recommended annual dental exams for proper oral hygiene  Community Resource Referral / Chronic Care Management: CRR required this visit?  No   CCM required  this visit?  No     Plan:     I have personally reviewed and noted the following in the patient's chart:   Medical and social history Use of alcohol, tobacco or illicit drugs  Current medications and supplements including opioid prescriptions. Patient is not currently taking opioid prescriptions. Functional ability and status Nutritional status Physical activity Advanced directives List of other physicians Hospitalizations, surgeries, and ER visits in previous 12 months Vitals Screenings to include cognitive, depression, and falls Referrals and appointments  In addition, I have reviewed and discussed with patient certain preventive protocols, quality metrics, and best practice recommendations. A written personalized care plan for preventive services as well as general preventive health recommendations were provided to patient.     Kandis Fantasia [place], [place]   [id number]/[id number]   After Visit Summary: (MyChart) Due to this being a telephonic visit, the after visit summary with patients personalized plan was offered to patient via MyChart   Nurse Notes: No concerns at this time

## 2023-01-28 NOTE — Patient Instructions (Signed)
Vanessa Alvarez , Thank you for taking time to come for your Medicare Wellness Visit. I appreciate your ongoing commitment to your health goals. Please review the following plan we discussed and let me know if I can assist you in the future.   Referrals/Orders/Follow-Ups/Clinician Recommendations: Aim for 30 minutes of exercise or brisk walking, 6-8 glasses of water, and 5 servings of fruits and vegetables each day.  This is a list of the screening recommended for you and due dates:  Health Maintenance  Topic Date Due   Hepatitis C Screening  Never done   DTaP/Tdap/Td vaccine (2 - Tdap) 05/16/2018   Pap with HPV screening  09/18/2019   Flu Shot  11/25/2022   COVID-19 Vaccine (5 - 2023-24 season) 12/26/2022   Medicare Annual Wellness Visit  01/28/2024   HIV Screening  Completed   HPV Vaccine  Aged Out    Advanced directives: (ACP Link)Information on Advanced Care Planning can be found at [facility] Advance Health Care Directives Advance Health Care Directives ([email or web address]   Next Medicare Annual Wellness Visit scheduled for next year: Yes

## 2023-02-11 ENCOUNTER — Ambulatory Visit: Payer: Medicare Other | Admitting: Family Medicine

## 2023-02-22 ENCOUNTER — Other Ambulatory Visit: Payer: Self-pay | Admitting: Family Medicine

## 2023-02-22 DIAGNOSIS — E039 Hypothyroidism, unspecified: Secondary | ICD-10-CM

## 2023-03-07 ENCOUNTER — Encounter: Payer: Self-pay | Admitting: Family Medicine

## 2023-03-07 ENCOUNTER — Ambulatory Visit (HOSPITAL_COMMUNITY)
Admission: RE | Admit: 2023-03-07 | Discharge: 2023-03-07 | Disposition: A | Discharge status: Home / Self Care | Payer: Medicare Other | Source: Ambulatory Visit | Attending: Family Medicine | Admitting: Family Medicine

## 2023-03-07 ENCOUNTER — Ambulatory Visit (INDEPENDENT_AMBULATORY_CARE_PROVIDER_SITE_OTHER): Payer: Medicare Other | Admitting: Family Medicine

## 2023-03-07 VITALS — BP 127/76 | HR 89 | Ht 64.0 in | Wt 222.6 lb

## 2023-03-07 DIAGNOSIS — I10 Essential (primary) hypertension: Secondary | ICD-10-CM | POA: Diagnosis present

## 2023-03-07 DIAGNOSIS — D508 Other iron deficiency anemias: Secondary | ICD-10-CM

## 2023-03-07 DIAGNOSIS — R079 Chest pain, unspecified: Secondary | ICD-10-CM | POA: Insufficient documentation

## 2023-03-07 NOTE — Progress Notes (Signed)
    SUBJECTIVE:   CHIEF COMPLAINT / HPI:   Follow up for BP and anemia. Iron studies were normal, may need to check b12/folate.  BP medications: Norvasc 10 mg, Coreg 12.5 mg, chlorthalidone 25 mg  Saw transplant doctor between last visit and now, labs checked on 10/31, Magnesium was slightly low. Otherwise labs in line with patient's baseline.  Patient reports ongoing left-sided chest pain.  It starts in the left lower edge of the mediastinum and follows the curve of her chest wall along the lower ribs.  It lasts 4 to 5 minutes at a time and is dull and achy.  Nothing makes it better, deep breathing makes it worse.  Patient has not tried anything to alleviate the pain and is concerned that it is her heart.  Denies shortness of breath, radiation to the arm or jaw, sweating, nausea, palpitations.  Also reported this pain at her visit with me in July and at her visit with her cardiologist in February.  At that time the pain was more sharp in nature and appeared to be exacerbated by lying down.  PERTINENT  PMH / PSH: Kidney transplant, 2016, HTN, chronic iron deficiency anemia.  OBJECTIVE:   BP 127/76   Pulse 89   Ht 5\' 4"  (1.626 m)   Wt 222 lb 9.6 oz (101 kg)   LMP 02/06/2023   SpO2 99%   BMI 38.21 kg/m   General: A&O, NAD Cardiac: RRR, no m/r/g.  Symmetric chest wall movement, no tenderness to palpation. Respiratory: CTAB, normal WOB, no w/c/r GI: Soft, NTTP, non-distended  Extremities: NTTP, no peripheral edema.  ASSESSMENT/PLAN:   Essential hypertension BP today is at goal.  No changes to current medication regimen at this time.  Recheck in 3 months.  Left-sided chest pain Patient continues to have achy intermittent left-sided chest pain.  Reassured that patient is not having any shortness of breath, radiation to the arm or jaw, sensation of palpitations.  Will check EKG today for further reassurance that this is not cardiac in nature, and proceed with conservative treatment  with Tylenol for pain and heat to help ease the aching.  Patient will follow-up in 4 weeks or sooner if needed.  Iron deficiency anemia Iron studies from previous visit showed normal binding capacity and elevated saturation, likely related to her chronic iron supplementation.  We will check a B12 and folate level today due to her ongoing fatigue and lower than usual hemoglobin level.  Her baseline is around 11.    Gerrit Heck, DO [facility]

## 2023-03-07 NOTE — Assessment & Plan Note (Signed)
BP today is at goal.  No changes to current medication regimen at this time.  Recheck in 3 months.

## 2023-03-07 NOTE — Assessment & Plan Note (Signed)
Patient continues to have achy intermittent left-sided chest pain.  Reassured that patient is not having any shortness of breath, radiation to the arm or jaw, sensation of palpitations.  Will check EKG today for further reassurance that this is not cardiac in nature, and proceed with conservative treatment with Tylenol for pain and heat to help ease the aching.  Patient will follow-up in 4 weeks or sooner if needed.

## 2023-03-07 NOTE — Assessment & Plan Note (Signed)
>>  ASSESSMENT AND PLAN FOR LEFT-SIDED CHEST PAIN WRITTEN ON 03/07/2023  9:18 AM BY Janeshia Ciliberto, DO  Patient continues to have achy intermittent left-sided chest pain.  Reassured that patient is not having any shortness of breath, radiation to the arm or jaw, sensation of palpitations.  Will check EKG today for further reassurance that this is not cardiac in nature, and proceed with conservative treatment with Tylenol for pain and heat to help ease the aching.  Patient will follow-up in 4 weeks or sooner if needed.

## 2023-03-07 NOTE — Patient Instructions (Signed)
It was wonderful to see you today!  Today we discussed your blood pressure, fatigue, and chest pain.  Blood pressure looks fantastic.  Please continue taking your Norvasc, Coreg, and Hygroton as previously prescribed.  If you have any questions or concerns about these medications you can always feel free to reach out.  For your fatigue I have ordered a B12 and a folate level.  When these lab results come back I will give you a phone call.  We may need to give you a vitamin to help replete these levels.  For your chest pain, we will check an EKG today.  If this looks the same as your previous tests we will treat this like chest wall pain.  You can take Tylenol as needed and use a hot compress such as a microwavable heating pad or a plug-in heating pad to help soothe the pain.  At your next visit we we will discuss further interventions if your pain is still ongoing.  I would like to see you back in 4 weeks to follow-up.  Please call [phone] with any questions about today's appointment.   If you need any additional refills, please call your pharmacy before calling the office.  Gerrit Heck, DO Family Medicine

## 2023-03-07 NOTE — Assessment & Plan Note (Signed)
Iron studies from previous visit showed normal binding capacity and elevated saturation, likely related to her chronic iron supplementation.  We will check a B12 and folate level today due to her ongoing fatigue and lower than usual hemoglobin level.  Her baseline is around 11.

## 2023-03-08 LAB — VITAMIN B12: Vitamin B-12: 584 pg/mL (ref 232–1245)

## 2023-03-08 LAB — FOLATE: Folate: 4.4 ng/mL (ref >3.0)

## 2023-03-24 ENCOUNTER — Other Ambulatory Visit: Payer: Self-pay | Admitting: Family Medicine

## 2023-03-24 DIAGNOSIS — E039 Hypothyroidism, unspecified: Secondary | ICD-10-CM

## 2023-04-14 ENCOUNTER — Other Ambulatory Visit: Payer: Self-pay | Admitting: Family Medicine

## 2023-04-15 ENCOUNTER — Ambulatory Visit: Payer: Medicare Other | Admitting: Family Medicine

## 2023-04-15 ENCOUNTER — Encounter: Payer: Self-pay | Admitting: Family Medicine

## 2023-04-15 ENCOUNTER — Telehealth: Payer: Self-pay | Admitting: Family Medicine

## 2023-04-15 VITALS — BP 124/77 | HR 83 | Ht 64.0 in | Wt 225.0 lb

## 2023-04-15 DIAGNOSIS — R739 Hyperglycemia, unspecified: Secondary | ICD-10-CM

## 2023-04-15 DIAGNOSIS — I10 Essential (primary) hypertension: Secondary | ICD-10-CM | POA: Diagnosis not present

## 2023-04-15 DIAGNOSIS — R0781 Pleurodynia: Secondary | ICD-10-CM | POA: Diagnosis not present

## 2023-04-15 LAB — POCT GLYCOSYLATED HEMOGLOBIN (HGB A1C): Hemoglobin A1C: 6 % — AB (ref 4.0–5.6)

## 2023-04-15 NOTE — Assessment & Plan Note (Signed)
Continues to have blood pressure readings at goal.  Will follow-up with patient in 2 months and then could move to 92-month follow-up schedule.

## 2023-04-15 NOTE — Progress Notes (Signed)
    SUBJECTIVE:   CHIEF COMPLAINT / HPI:   F/u for chest wall pain. Was instructed to take as needed tylenol and use heat for symptomatic relief  BP elevated, 142/87 on arrival, will recheck post exam.  Patient still having chest wall pain associated with the lower ribs.  It still is not necessarily exacerbated by anything that she can put her finger on.  She has not tried taking Tylenol or using the heating pad for this pain as it often goes away before she even thinks about it.  Patient and her mother are both still very concerned about her possibly having prediabetes as reported by the provider who goes over her medications.  I have not been able to find a record which shows possibility of prediabetes but they would like to check an A1c today.  PERTINENT  PMH / PSH: Hypertension, chest wall pain  OBJECTIVE:   BP 124/77   Pulse 83   Ht 5\' 4"  (1.626 m)   Wt 225 lb (102.1 kg)   LMP 03/10/2023   SpO2 100%   BMI 38.62 kg/m   General: A&O, NAD Cardiac: RRR, no m/r/g.  No current reproduction of pain with palpation, but pain is present in same pattern as previous exams. Respiratory: CTAB, normal WOB, no w/c/r Extremities: NTTP, no peripheral edema.  ASSESSMENT/PLAN:   Essential hypertension Continues to have blood pressure readings at goal.  Will follow-up with patient in 2 months and then could move to 56-month follow-up schedule.  Rib pain on left side Patient continues to have intermittent pain on the left side associated with lower 3 ribs.  No bony dysfunction noted on palpation today.  Patient has not yet tried the treatment outlined in the last exam note.  Reiterated to patient and her mother that Tylenol and heat will help resolve this pain and that we have done extensive workup to rule out cardiac and respiratory causes for this pain.  Instructed patient to take Tylenol as needed and use heat for pain relief.  Elevated blood sugar Given that patient was told she had  prediabetes by a different provider and she does have risk factors for such, ordered hemoglobin A1c to be drawn today.  Will follow-up as appropriate based on results of this test.   Gerrit Heck, DO [facility]

## 2023-04-15 NOTE — Patient Instructions (Signed)
It was wonderful to see you today!  Today we discussed your ongoing rib pain.  Please try to take Tylenol and use the heating pad when you have this pain.  If you find that laying on a certain side or sitting in a certain position for too long the pain worse, change positions or add padding to chairs for further support.   Your blood pressure on recheck was much better, so we will not make any changes to your medications today.  Please follow-up with me in 2 months for further monitoring.  We also checked a hemoglobin A1c today for your concern over prediabetes.  When I have the results I will give you a call and we can make plans for treatment as needed.  Please call [phone] with any questions about today's appointment.   If you need any additional refills, please call your pharmacy before calling the office.  Gerrit Heck, DO Family Medicine

## 2023-04-15 NOTE — Assessment & Plan Note (Signed)
Patient continues to have intermittent pain on the left side associated with lower 3 ribs.  No bony dysfunction noted on palpation today.  Patient has not yet tried the treatment outlined in the last exam note.  Reiterated to patient and her mother that Tylenol and heat will help resolve this pain and that we have done extensive workup to rule out cardiac and respiratory causes for this pain.  Instructed patient to take Tylenol as needed and use heat for pain relief.

## 2023-04-15 NOTE — Assessment & Plan Note (Signed)
Given that patient was told she had prediabetes by a different provider and she does have risk factors for such, ordered hemoglobin A1c to be drawn today.  Will follow-up as appropriate based on results of this test.

## 2023-04-15 NOTE — Telephone Encounter (Signed)
Called patient's mother with results of hemoglobin A1c.  After confirming patient's name and date of birth informed patient's mother that she does have prediabetes and would need to return sooner than expected to have appointment to discuss this.  Scheduled patient for January 10 at 11:25 AM.

## 2023-04-24 ENCOUNTER — Other Ambulatory Visit: Payer: Self-pay | Admitting: Family Medicine

## 2023-04-24 DIAGNOSIS — E039 Hypothyroidism, unspecified: Secondary | ICD-10-CM

## 2023-05-06 ENCOUNTER — Ambulatory Visit: Payer: Self-pay | Admitting: Family Medicine

## 2023-05-12 ENCOUNTER — Encounter: Payer: Self-pay | Admitting: Family Medicine

## 2023-05-12 ENCOUNTER — Ambulatory Visit: Payer: Medicare Other | Admitting: Family Medicine

## 2023-05-12 VITALS — BP 123/70 | HR 85 | Ht 64.0 in | Wt 223.0 lb

## 2023-05-12 DIAGNOSIS — R7303 Prediabetes: Secondary | ICD-10-CM | POA: Diagnosis present

## 2023-05-12 NOTE — Patient Instructions (Addendum)
It was wonderful to see you today!  Today we discussed your blood sugar.  You have a couple of medications from your specialists which are likely contributing to this new diagnosis of prediabetes.  Since we cannot change those medications it is important that we work on managing the things we can.  As we discussed, the best way to change your diet is to follow the healthy plate method or half your plate is vegetables one quarter of your plate is protein and one quarter of your plate is a starch like potatoes or bread.  We also talked about having a goal of walking for 30 minutes 3 times a week.  I will see you back in 1 month for blood pressure check at which time I will check in with you about how these new behaviors are going.  Please call [phone] with any questions about today's appointment.   If you need any additional refills, please call your pharmacy before calling the office.  Gerrit Heck, DO Family Medicine

## 2023-05-12 NOTE — Assessment & Plan Note (Signed)
Discussed with patient in depth healthy diet and exercise, including the healthy plate model where half the plate is vegetables, one quarter of the plate is protein, and one quarter of the plate is a starch.  Advised patient that it was okay to eat sweets as it would be unrealistic to cut them out completely at this point but recommended that she cut back to having dessert after dinner once a week.  After discussion about exercise encouraged patient to walk for 30 minutes 3 times a week.  I will follow-up with her in 1 month at her blood pressure check to see how she is doing with these goals.

## 2023-05-12 NOTE — Progress Notes (Signed)
    SUBJECTIVE:   CHIEF COMPLAINT / HPI:   Patient has new diagnosis of prediabetes.  Diet- Mostly meat and starches like bread, grits. Does like vegetables and fruits, but does not often eat them. May have soda once or twice a week.  Often has dessert after dinner, greater than 5 times a week.  Exercise-walks occasionally, usually for 30 minutes at a time.  Also takes 2 medications which have the side effect of raising blood sugar, prednisolone and Abilify.  These are prescribed by her specialists and cannot be discontinued.  PERTINENT  PMH / PSH: Hypertension, kidney transplant  OBJECTIVE:   BP 123/70   Pulse 85   Ht 5\' 4"  (1.626 m)   Wt 223 lb (101.2 kg)   LMP 03/10/2023   SpO2 99%   BMI 38.28 kg/m   General: A&O, NAD HEENT: No sign of trauma, EOM grossly intact Cardiac: RRR, no m/r/g Respiratory: CTAB, normal WOB, no w/c/r  ASSESSMENT/PLAN:   Prediabetes Discussed with patient in depth healthy diet and exercise, including the healthy plate model where half the plate is vegetables, one quarter of the plate is protein, and one quarter of the plate is a starch.  Advised patient that it was okay to eat sweets as it would be unrealistic to cut them out completely at this point but recommended that she cut back to having dessert after dinner once a week.  After discussion about exercise encouraged patient to walk for 30 minutes 3 times a week.  I will follow-up with her in 1 month at her blood pressure check to see how she is doing with these goals.   Gerrit Heck, DO [facility]

## 2023-06-17 ENCOUNTER — Ambulatory Visit: Payer: Medicare Other | Admitting: Family Medicine

## 2023-06-17 VITALS — BP 126/80 | HR 90 | Wt 221.4 lb

## 2023-06-17 DIAGNOSIS — I10 Essential (primary) hypertension: Secondary | ICD-10-CM

## 2023-06-17 DIAGNOSIS — R7303 Prediabetes: Secondary | ICD-10-CM

## 2023-06-17 DIAGNOSIS — M542 Cervicalgia: Secondary | ICD-10-CM

## 2023-06-17 NOTE — Progress Notes (Signed)
    SUBJECTIVE:   CHIEF COMPLAINT / HPI:   Day was Vanessa Alvarez's follow-up appointment for her prediabetes and to check her blood pressure.  She has been doing well with the diet modifications we discussed last time.  She reports having dessert only 1 time a week cut back from 4-5 times a week, and has been increasing her intake of vegetables.  After discussion with Vanessa Alvarez and her mom we have decided to test her A1c again in 3 months rather than waiting the full 6 months we had discussed last time.  Blood pressure in the office today was at goal so no further interventions are needed  Vanessa Alvarez is also complaining of some right sided neck pain that started around the time of her last visit with me.  The pain is dull and does not seem to have any particular timing but can be made worse by turning her head quickly or sharply.  He has not tried anything to help with the pain, it does not wake her from sleep.  She denies tingling or numbness in her arm or radiation into the jaw or chest.  PERTINENT  PMH / PSH: HTN, prediabetes, kidney transplant  OBJECTIVE:   BP 126/80   Pulse 90   Wt 221 lb 6.4 oz (100.4 kg)   SpO2 96%   BMI 38.00 kg/m   General: A&O, NAD HEENT: No sign of trauma, EOM grossly intact Neck: Supple, nontender.  Trachea is midline.  Full pain-free active range of motion.  No apparent bony deformities, musculature overlying the right levator scapula territory is tense and ropey.  Patient reports tenderness to palpation over this muscle. Cardiac: RRR, no m/r/g Respiratory: CTAB, normal WOB, no w/c/r GI: Soft, NTTP, non-distended  Extremities: NTTP, no peripheral edema.  ASSESSMENT/PLAN:   Neck pain on right side Neck pain most likely related to muscle strain of the left or scapula.  Vies patient that she could take over-the-counter pain medications as needed, and use heat and ice to sooth the sore muscle.  Also provided stretches for the patient to do to help ease the pain and relieve  any tension.  If patient is still having pain in 2 weeks she will follow-up in the office.  Prediabetes Patient reports successful compliance with dietary modifications from last visit.  We will recheck her A1c in 3 months  Essential hypertension Blood pressure at goal today.  Will continue to monitor but no changes at this time.   Gerrit Heck, DO [facility]

## 2023-06-17 NOTE — Assessment & Plan Note (Signed)
Patient reports successful compliance with dietary modifications from last visit.  We will recheck her A1c in 3 months

## 2023-06-17 NOTE — Assessment & Plan Note (Signed)
Blood pressure at goal today.  Will continue to monitor but no changes at this time.

## 2023-06-17 NOTE — Assessment & Plan Note (Signed)
Neck pain most likely related to muscle strain of the left or scapula.  Vies patient that she could take over-the-counter pain medications as needed, and use heat and ice to sooth the sore muscle.  Also provided stretches for the patient to do to help ease the pain and relieve any tension.  If patient is still having pain in 2 weeks she will follow-up in the office.

## 2023-06-17 NOTE — Patient Instructions (Addendum)
It was wonderful to see you today!  Today was your check in about your prediabetes and your blood pressure. Keep up the strong work with your eating habits, and we will check your A1c again at your next check in 3 months from now.   For your neck pain, it is most consistent with muscle strain.  I would recommend taking as needed over-the-counter pain medicine like Tylenol and using heat and ice to help soothe and relax the muscle.  I have also attached a set of stretches for you that can help relieve muscle tension over time.  If this pain continues after 2 weeks or gets worse, please schedule a follow-up appointment.  Otherwise I will see you at your next A1c check in 3 months.  Please call [phone] with any questions about today's appointment.   If you need any additional refills, please call your pharmacy before calling the office.  Gerrit Heck, DO Family Medicine

## 2023-07-01 ENCOUNTER — Other Ambulatory Visit: Payer: Self-pay | Admitting: Gastroenterology

## 2023-07-10 ENCOUNTER — Other Ambulatory Visit: Payer: Self-pay | Admitting: Family Medicine

## 2023-07-12 ENCOUNTER — Encounter: Payer: Self-pay | Admitting: Gastroenterology

## 2023-08-20 ENCOUNTER — Other Ambulatory Visit: Payer: Self-pay | Admitting: Family Medicine

## 2023-08-20 DIAGNOSIS — E039 Hypothyroidism, unspecified: Secondary | ICD-10-CM

## 2023-09-01 ENCOUNTER — Ambulatory Visit: Admitting: Family Medicine

## 2023-09-01 ENCOUNTER — Encounter: Payer: Self-pay | Admitting: Family Medicine

## 2023-09-01 VITALS — BP 123/81 | HR 82 | Ht 64.0 in | Wt 211.2 lb

## 2023-09-01 DIAGNOSIS — M542 Cervicalgia: Secondary | ICD-10-CM

## 2023-09-01 DIAGNOSIS — L304 Erythema intertrigo: Secondary | ICD-10-CM | POA: Diagnosis not present

## 2023-09-01 DIAGNOSIS — R7303 Prediabetes: Secondary | ICD-10-CM | POA: Diagnosis present

## 2023-09-01 DIAGNOSIS — B360 Pityriasis versicolor: Secondary | ICD-10-CM

## 2023-09-01 LAB — POCT GLYCOSYLATED HEMOGLOBIN (HGB A1C): Hemoglobin A1C: 5.6 % (ref 4.0–5.6)

## 2023-09-01 MED ORDER — TERBINAFINE HCL 1 % EX CREA: 1.0000 | TOPICAL_CREAM | Freq: Two times a day (BID) | CUTANEOUS | 1 refills | Status: AC

## 2023-09-01 MED ORDER — CYCLOBENZAPRINE HCL 5 MG PO TABS: 5.0000 mg | ORAL_TABLET | Freq: Three times a day (TID) | ORAL | 1 refills | Status: AC | PRN

## 2023-09-01 MED ORDER — NYSTATIN 100000 UNIT/GM EX POWD: 1.0000 | Freq: Three times a day (TID) | CUTANEOUS | 1 refills | Status: DC

## 2023-09-01 NOTE — Assessment & Plan Note (Signed)
 Suspect neck pain and vertigo are related. Epley maneuver attempted and negative in office today. Will try course of flexeril , 5 mg as needed for muscular tension.

## 2023-09-01 NOTE — Assessment & Plan Note (Signed)
 A1c in office today 5.6, normal range. Continue diet and exercise. Resolved.

## 2023-09-01 NOTE — Progress Notes (Signed)
    SUBJECTIVE:   CHIEF COMPLAINT / HPI:   A1c follow up. Patient has normal A1c today in office. She reports continuing to walk regularly and keep up with the healthy dietary changes  Skin rash: Patient presents with patchy, hyperpigmented flat rash on the underside of her chin, the front of her neck and her chest. The rash has been present for at least a year, but waxes and wanes in visibility. It is itchy. She also has a patch on her back.   She has a separate rash on the underside of both her breasts that has been present for 2-3 weeks, and is also itchy.  Neck Pain/Dizziness:  Patient also reports recurrence of her intermittent vertigo. She reports it is occurring more frequently, and episodes are lasting longer. She also has continuing R sided neck pain and tightness that seems to flare when she has these dizzy spells.   PERTINENT  PMH / PSH: Prediabetes  OBJECTIVE:   BP 123/81   Pulse 82   Ht 5\' 4"  (1.626 m)   Wt 211 lb 3.2 oz (95.8 kg)   SpO2 99%   BMI 36.25 kg/m   General: A&O, NAD Cardiac: RRR, no m/r/g Respiratory: CTAB, normal WOB, no w/c/r Skin: 10 cm by 10 cm patch of flat hyperpigmented macules on the neck and chest. Second, smaller patch on the R upper back. Excoriations present.  Under breast rash  is raised, erythematous plaques with smaller satellite lesions MSK: Previously noted right shoulder tightness, with palpable cool, ropey texture, and tenderness to palpation over the scapula and side of the neck up to the occiput.   ASSESSMENT/PLAN:   Assessment & Plan Prediabetes A1c in office today 5.6, normal range. Continue diet and exercise. Resolved.  Neck pain Suspect neck pain and vertigo are related. Epley maneuver attempted and negative in office today. Will try course of flexeril , 5 mg as needed for muscular tension. Intertrigo Nystatin  powder TID for 5 days Tinea versicolor Terbinafine  1% cream BID for 2 weeks, advised patient that if the rash did  not improve in 2 weeks to refill the cream and extend the course to one month. If at that point the rash persists, she will call and we will try a different therapy.    Rayma Calandra, DO [facility] Center

## 2023-09-01 NOTE — Patient Instructions (Signed)
 It was wonderful to see you today!  Today your A1c was 5.6, which is in the normal range! Keep up the good work with the healthy diet and exercises.  For your rashes I have prescribed two medicines. One is cream that you will use on your chest and back twice a day for the next two weeks. This type of rash can last a while, so if it is not gone after two weeks, please refill the cream and continue to use it for up to one month. For the rash under your breasts, I have prescribed a powder. Use this 3 times a day for five days, and try to keep the area clean and dry.   For your neck pain and dizziness, I have prescribed a medicine called flexeril . You can take this medicine up to three times a day as needed, but you should start taking it once a day at bedtime because it can make you tired.   I will see you again in July for your yearly physical, or sooner if needed.   Please call [phone] with any questions about today's appointment.   If you need any additional refills, please call your pharmacy before calling the office.  Rayma Calandra, DO Family Medicine

## 2023-09-12 ENCOUNTER — Encounter: Payer: Self-pay | Admitting: Family Medicine

## 2023-09-28 ENCOUNTER — Other Ambulatory Visit: Payer: Self-pay | Admitting: Family Medicine

## 2023-09-29 ENCOUNTER — Other Ambulatory Visit: Payer: Self-pay | Admitting: Gastroenterology

## 2023-11-04 ENCOUNTER — Encounter: Payer: Self-pay | Admitting: Family Medicine

## 2023-11-04 ENCOUNTER — Ambulatory Visit (INDEPENDENT_AMBULATORY_CARE_PROVIDER_SITE_OTHER): Admitting: Family Medicine

## 2023-11-04 VITALS — BP 138/80 | HR 88 | Ht 64.0 in | Wt 213.0 lb

## 2023-11-04 DIAGNOSIS — L819 Disorder of pigmentation, unspecified: Secondary | ICD-10-CM

## 2023-11-04 DIAGNOSIS — E66812 Obesity, class 2: Secondary | ICD-10-CM

## 2023-11-04 DIAGNOSIS — K219 Gastro-esophageal reflux disease without esophagitis: Secondary | ICD-10-CM | POA: Diagnosis not present

## 2023-11-04 DIAGNOSIS — D509 Iron deficiency anemia, unspecified: Secondary | ICD-10-CM

## 2023-11-04 DIAGNOSIS — R42 Dizziness and giddiness: Secondary | ICD-10-CM

## 2023-11-04 DIAGNOSIS — Z940 Kidney transplant status: Secondary | ICD-10-CM

## 2023-11-04 MED ORDER — OMEPRAZOLE 20 MG PO CPDR: DELAYED_RELEASE_CAPSULE | ORAL | 0 refills | Status: DC

## 2023-11-04 NOTE — Assessment & Plan Note (Signed)
-  refilled omeprazole  20 mg

## 2023-11-04 NOTE — Assessment & Plan Note (Signed)
-  improved, continue as needed flexeril  for muscle tightness associated with vertigo episodes.

## 2023-11-04 NOTE — Patient Instructions (Signed)
 It was wonderful to see you today!  The skin rashes you had previously have healed nicely. You can use over the counter hydrocortisone  cream to help with any remaining skin discoloration or minor itching as needed. We will do routine blood work today to check your kidney function and follow up on your chronic anemia. Please follow up in about 6 months, in January of 2026 or sooner if needed.   Please call [phone] with any questions about today's appointment.   If you need any additional refills, please call your pharmacy before calling the office.  Vanessa Pinal, DO Family Medicine

## 2023-11-04 NOTE — Progress Notes (Signed)
    SUBJECTIVE:   CHIEF COMPLAINT / HPI:   Follow up for skin rash and vertigo -previously diagnosed with Tinea corporis and intertrigo. Both have resolved with minimal residual skin hypopigmentation -vertigo has improved, patient still has muscle relaxer on hand if symptoms return  Patient has upcoming appointment with the kidney transplant team, will complete certain basic lab work today in anticipation  PERTINENT  PMH / PSH: kidney transplant  OBJECTIVE:   BP 138/80   Pulse 88   Ht 5' 4 (1.626 m)   Wt 213 lb (96.6 kg)   LMP 10/25/2023   SpO2 96%   BMI 36.56 kg/m   General: A&O, NAD Cardiac: RRR, no m/r/g Respiratory: CTAB, normal WOB, no w/c/r GI: Soft, NTTP, non-distended  Extremities: NTTP, no peripheral edema. Skin: Improving areas of hypopigmentation on the neck and chest, total resolution of erythema under the breasts with postinflammatory hyperpigmentation present  ASSESSMENT/PLAN:   Assessment & Plan Deceased-donor kidney transplant -Complete BMP and CBC today prior to appointment with transplant team  Gastroesophageal reflux disease, unspecified whether esophagitis present -refilled omeprazole  20 mg Intermittent vertigo -improved, continue as needed flexeril  for muscle tightness associated with vertigo episodes.  Abnormal pigmentation of skin -advised OTC hydrocortisone  for resolution of skin changes.    Follow up in 6 months for physical.  Vanessa Pinal, DO [facility] Center

## 2023-11-04 NOTE — Assessment & Plan Note (Signed)
-  Complete BMP and CBC today prior to appointment with transplant team

## 2023-11-05 ENCOUNTER — Ambulatory Visit: Payer: Self-pay | Admitting: Family Medicine

## 2023-11-05 LAB — BASIC METABOLIC PANEL WITH GFR
BUN/Creatinine Ratio: 12 (ref 9–23)
BUN: 18 mg/dL (ref 6–20)
CO2: 22 mmol/L (ref 20–29)
Calcium: 9.9 mg/dL (ref 8.7–10.2)
Chloride: 102 mmol/L (ref 96–106)
Creatinine, Ser: 1.5 mg/dL — ABNORMAL HIGH (ref 0.57–1.00)
Glucose: 109 mg/dL — ABNORMAL HIGH (ref 70–99)
Potassium: 4.7 mmol/L (ref 3.5–5.2)
Sodium: 139 mmol/L (ref 134–144)
eGFR: 45 mL/min/1.73 — ABNORMAL LOW (ref >59)

## 2023-11-05 LAB — CBC
Hematocrit: 36.8 % (ref 34.0–46.6)
Hemoglobin: 11.6 g/dL (ref 11.1–15.9)
MCH: 24.6 pg — ABNORMAL LOW (ref 26.6–33.0)
MCHC: 31.5 g/dL (ref 31.5–35.7)
MCV: 78 fL — ABNORMAL LOW (ref 79–97)
Platelets: 280 x10E3/uL (ref 150–450)
RBC: 4.71 x10E6/uL (ref 3.77–5.28)
RDW: 15.1 % (ref 11.7–15.4)
WBC: 6.1 x10E3/uL (ref 3.4–10.8)

## 2023-12-17 ENCOUNTER — Other Ambulatory Visit: Payer: Self-pay | Admitting: Family Medicine

## 2023-12-17 DIAGNOSIS — E039 Hypothyroidism, unspecified: Secondary | ICD-10-CM

## 2024-01-29 ENCOUNTER — Other Ambulatory Visit: Payer: Self-pay | Admitting: Family Medicine

## 2024-01-29 DIAGNOSIS — K219 Gastro-esophageal reflux disease without esophagitis: Secondary | ICD-10-CM

## 2024-02-08 ENCOUNTER — Ambulatory Visit

## 2024-02-08 VITALS — BP 131/78 | HR 81 | Ht 64.0 in | Wt 215.0 lb

## 2024-02-08 DIAGNOSIS — R040 Epistaxis: Secondary | ICD-10-CM | POA: Diagnosis present

## 2024-02-08 MED ORDER — OXYMETAZOLINE HCL 0.05 % NA SOLN
1.0000 | NASAL | 0 refills | Status: AC | PRN
Start: 2024-02-08 — End: ?

## 2024-02-08 MED ORDER — SALINE SPRAY 0.65 % NA SOLN: 1.0000 | Freq: Every day | NASAL | 0 refills | Status: AC

## 2024-02-08 NOTE — Progress Notes (Signed)
    SUBJECTIVE:   CHIEF COMPLAINT / HPI:   Nose bleed - 4 days of nose bleeds. No prior hx - No lightheadedness or pain - Some slow oozing blood and blood with blowing nose - She does take ASA but no other blood thinners or antiplatelet agents.  - denies trauma, nose picking  PERTINENT  PMH / PSH: HTN, FSGS, intellectual disability, renal transplant  OBJECTIVE:   BP 131/78   Pulse 81   Ht 5' 4 (1.626 m)   Wt 215 lb (97.5 kg)   LMP 01/02/2024   SpO2 99%   BMI 36.90 kg/m   Physical Exam General: Alert, conversant, cooperative. No acute distress.  HEENT: PERRL. EOMI. MMM. Dried blood in both nostrils. No obvious scab Cardiovascular: RRR Respiratory: Lungs CTAB. Normal work of breathing. Musculoskeletal: No gross deformities.  Skin: Warm. Dry. No rashes. No icterus.  Neurologic: No focal deficits. Moving all extremities. Psychiatric: Cooperative. Appropriate mood. Appropriate affect.   ASSESSMENT/PLAN:   Assessment & Plan Bleeding from the nose Discussed prevention with daily ocean nasal spray. Have given afrin for uncontrolled bleeding. Encouraged to use only once a day and not for more than three days in a row.      Milda LITTIE Deed, MD [facility]

## 2024-02-08 NOTE — Patient Instructions (Signed)
 It was good to see you today.   Please bring ALL of your medications with you to every visit.    Today we talked about: Nose bleeds When you have a nosebleed:  Sit down. Tilt your head forward a little. Follow these steps: Pinch your nose with a clean towel or tissue. Keep pinching your nose for 5 minutes. Do not let go. After 5 minutes, let go of your nose. Keep doing these steps until the bleeding stops. Do not put tissues or other things in your nose to stop the bleeding. Avoid lying down or putting your head back. Use a nose spray decongestant as told by your doctor. After a nosebleed: Try not to blow your nose or sniffle for several hours. Try not to strain, lift, or bend at the waist for several days. Aspirin and medicines that thin your blood make bleeding more likely. If you take these medicines: Ask your doctor if you should stop taking them or if you should change how much you take. Do not stop taking the medicine unless your doctor tells you to. If your nosebleed was caused by dryness, use over-the-counter saline nasal spray or gel and a humidifier as told by your doctor. This will keep the inside of your nose moist and allow it to heal. If you need to use nasal spray or gel: Choose one that is water-soluble. Use only as much as you need and use it only as often as needed. Do not lie down right away after you use it. If you get nosebleeds often, talk with your doctor about treatments. These may include: Nasal cautery. A chemical swab or electrical device is used to lightly burn tiny blood vessels inside the nose. This helps stop or prevent nosebleeds. Nasal packing. A gauze or other material is placed in the nose to keep constant pressure on the bleeding area. Contact a doctor if: You have a fever. You get nosebleeds often. You get nosebleeds more often than usual. You bruise very easily. You have something stuck in your nose. You are bleeding in your mouth. You vomit  or cough up brown material. You get a nosebleed after you start a new medicine. Get help right away if: You have a nosebleed after you fall or hurt your head. Your nosebleed does not go away after 20 minutes. You feel dizzy or weak. You have unusual bleeding from other parts of your body. You have unusual bruising on other parts of your body. You get sweaty. You vomit blood.    Thank you for choosing [facility]. Please refer to your mychart for specifics regarding today's visit or future appointments.

## 2024-02-15 ENCOUNTER — Ambulatory Visit (INDEPENDENT_AMBULATORY_CARE_PROVIDER_SITE_OTHER): Admitting: Student

## 2024-02-15 VITALS — BP 132/78 | HR 91 | Temp 98.7°F | Ht 64.0 in | Wt 215.2 lb

## 2024-02-15 DIAGNOSIS — R040 Epistaxis: Secondary | ICD-10-CM

## 2024-02-15 NOTE — Patient Instructions (Signed)
 It was great to see you! Thank you for allowing me to participate in your care!   I recommend that you always bring your medications to each appointment as this makes it easy to ensure we are on the correct medications and helps us  not miss when refills are needed.  Our plans for today:  - Follow-up in 1-2 weeks if bleeding does not resolve - We are checking some labs today, I will call you if they are abnormal will send you a MyChart message or a letter if they are normal.  If you do not hear about your labs in the next 2 weeks please let us  know.  Take care and seek immediate care sooner if you develop any concerns. Please remember to show up 15 minutes before your scheduled appointment time!  Gladis Church, DO [facility]

## 2024-02-15 NOTE — Progress Notes (Signed)
    SUBJECTIVE:   CHIEF COMPLAINT / HPI:   Discussed the use of AI scribe software for clinical note transcription with the patient, who gave verbal consent to proceed.  History of Present Illness Vanessa Alvarez is a 38 year old female with a history of kidney transplant who presents with recurrent epistaxis.  Epistaxis - Daily episodes since February 08, 2024, except for the day prior to this visit - Bleeding occurs on the right side - Each episode lasts less than ten minutes - Managed by pinching nose and using saline nasal spray - No history of trauma or nose picking - Fever of 99.29F - Vomiting, nasal congestion, and sore throat began yesterday - Currently taking aspirin - No other blood thinners - Medications include tacrolimus , mycophenolate , levothyroxine , lamotrigine , carvedilol , atorvastatin, amlodipine, sertraline , and daily iron supplementation - Kidney transplant in 2021 - Stent placement for hemodialysis access?  Coagulation defect? - Unspecified coagulation defect noted in November 2016, do not see further work-up or documentation.   OBJECTIVE:   BP 132/78   Pulse 91   Temp 98.7 F (37.1 C) (Oral)   Ht 5' 4 (1.626 m)   Wt 215 lb 3.2 oz (97.6 kg)   LMP 02/10/2024   SpO2 99%   BMI 36.94 kg/m   Physical Exam HEENT: No scratches, bleeding, or AVM's in the nose. Nasal mucosa dry. Clear throat CHEST: Lungs clear to auscultation bilaterally. Cardio: RRR  ASSESSMENT/PLAN:   Assessment & Plan Bleeding from the nose Differential: Dry mucosa, allergic rhinitis. No evidence of trauma/excoriation syndromes.  Given mycophenolate  use with aspirin, will obtain CBC with differential to ensure adequate platelet count.  Given unclear coagulopathy history, will also obtain PT/INR and PTT. - Continue nasal spray - Continue Afrin as needed - Consider ENT referral if bleeding continues     Follow-up recommendations Recommend PCP follow-up to discuss necessity of  aspirin, may benefit from speaking with vascular or her nephrologist to determine necessity.  Gladis Church, DO [facility]

## 2024-02-16 ENCOUNTER — Telehealth: Payer: Self-pay

## 2024-02-16 ENCOUNTER — Ambulatory Visit: Payer: Self-pay | Admitting: Student

## 2024-02-16 DIAGNOSIS — R040 Epistaxis: Secondary | ICD-10-CM

## 2024-02-16 LAB — CBC WITH DIFFERENTIAL/PLATELET
Basophils Absolute: 0 x10E3/uL (ref 0.0–0.2)
Basos: 0 %
EOS (ABSOLUTE): 0.2 x10E3/uL (ref 0.0–0.4)
Eos: 3 %
Hematocrit: 35.1 % (ref 34.0–46.6)
Hemoglobin: 11 g/dL — ABNORMAL LOW (ref 11.1–15.9)
Immature Grans (Abs): 0 x10E3/uL (ref 0.0–0.1)
Immature Granulocytes: 0 %
Lymphocytes Absolute: 0.5 x10E3/uL — ABNORMAL LOW (ref 0.7–3.1)
Lymphs: 8 %
MCH: 24.6 pg — ABNORMAL LOW (ref 26.6–33.0)
MCHC: 31.3 g/dL — ABNORMAL LOW (ref 31.5–35.7)
MCV: 79 fL (ref 79–97)
Monocytes Absolute: 0.7 x10E3/uL (ref 0.1–0.9)
Monocytes: 11 %
Neutrophils Absolute: 4.8 x10E3/uL (ref 1.4–7.0)
Neutrophils: 78 %
Platelets: 275 x10E3/uL (ref 150–450)
RBC: 4.47 x10E6/uL (ref 3.77–5.28)
RDW: 15 % (ref 11.7–15.4)
WBC: 6.2 x10E3/uL (ref 3.4–10.8)

## 2024-02-16 LAB — APTT: aPTT: 31 s (ref 24–33)

## 2024-02-16 LAB — PROTIME-INR
INR: 1.1 (ref 0.9–1.2)
Prothrombin Time: 11.4 s (ref 9.1–12.0)

## 2024-02-16 NOTE — Telephone Encounter (Signed)
 Patient's mother calls nurse line regarding concern with nosebleed.   Patient has been having issues with nose bleeds since 10/15. She had OV on 10/15 and 10/22. Mother reports that she did not have nose bleed on Tuesday or Wednesday.   Mother reports that today around 2 pm, patient had nose bleed, lasting approx 3-5 minutes.   Mother was concerned as she noticed clot in her right nostril. Mother pulled on clot until it was removed. In total, clot appeared to be about the size of a half dollar. Mother reports that bleeding resolved after clot came out.   Patient is not in current distress. Denies lightheadedness or dizziness.   Advised that I would send message to provider and check to see if ENT referral is now indicated and any additional advisement.   ED precautions discussed.   Chiquita JAYSON English, RN

## 2024-02-24 ENCOUNTER — Encounter (INDEPENDENT_AMBULATORY_CARE_PROVIDER_SITE_OTHER): Payer: Self-pay

## 2024-03-22 ENCOUNTER — Other Ambulatory Visit: Payer: Self-pay | Admitting: Family Medicine

## 2024-04-16 ENCOUNTER — Other Ambulatory Visit: Payer: Self-pay | Admitting: Family Medicine

## 2024-04-16 DIAGNOSIS — E039 Hypothyroidism, unspecified: Secondary | ICD-10-CM

## 2024-04-26 ENCOUNTER — Other Ambulatory Visit: Payer: Self-pay | Admitting: Family Medicine

## 2024-04-26 DIAGNOSIS — K219 Gastro-esophageal reflux disease without esophagitis: Secondary | ICD-10-CM

## 2024-05-01 ENCOUNTER — Ambulatory Visit: Admitting: Family Medicine

## 2024-05-09 ENCOUNTER — Ambulatory Visit: Admitting: Family Medicine

## 2024-05-09 ENCOUNTER — Ambulatory Visit: Payer: Self-pay | Admitting: Family Medicine

## 2024-05-09 VITALS — BP 125/74 | HR 84 | Ht 64.0 in | Wt 223.5 lb

## 2024-05-09 DIAGNOSIS — E039 Hypothyroidism, unspecified: Secondary | ICD-10-CM | POA: Diagnosis not present

## 2024-05-09 DIAGNOSIS — R7303 Prediabetes: Secondary | ICD-10-CM | POA: Diagnosis present

## 2024-05-09 DIAGNOSIS — R21 Rash and other nonspecific skin eruption: Secondary | ICD-10-CM

## 2024-05-09 LAB — POCT GLYCOSYLATED HEMOGLOBIN (HGB A1C): HbA1c, POC (prediabetic range): 6.4 % (ref 5.7–6.4)

## 2024-05-09 MED ORDER — TRIAMCINOLONE ACETONIDE 0.5 % EX OINT: 1.0000 | TOPICAL_OINTMENT | Freq: Two times a day (BID) | CUTANEOUS | 0 refills | Status: AC

## 2024-05-09 NOTE — Patient Instructions (Addendum)
 It was wonderful to see you today!  I am not sure what is causing the rash on your skin. We will try a steroid ointment 2 times a day for the next two weeks, and see what happens. I will see you then to discuss next steps.   We also rechecked your A1c and thyroid  levels for routine monitoring. If there are any changes we need to make based on the results, I will call you to let you know.   Please call [phone] with any questions about today's appointment.   If you need any additional refills, please call your pharmacy before calling the office.  [name], DO Family Medicine

## 2024-05-09 NOTE — Progress Notes (Signed)
" ° ° °  SUBJECTIVE:   CHIEF COMPLAINT / HPI:   Prediabetes  - last A1c was 5.6, diet controlled - some concern for elevation now, given the holiday season  Rash on Neck - multiple dark brown scaly patches on the neck - not itchy right now - mom noticed them about a month ago - had a previous case of tinea versicolor over the summer with some post treatment hyperpigmentation  PERTINENT  PMH / PSH: prediabetes, eczema  OBJECTIVE:   BP 125/74   Pulse 84   Ht 5' 4 (1.626 m)   Wt 223 lb 8 oz (101.4 kg)   SpO2 97%   BMI 38.36 kg/m   General: A&O, NAD Cardiac: RRR, no m/r/g Respiratory: CTAB, normal WOB, no w/c/r Skin:   ASSESSMENT/PLAN:   Assessment & Plan Prediabetes - recheck A1c today, 6.4 - currently diet controlled, will discuss intervention with mother at follow up  - recheck in 3 months Hypothyroidism, unspecified type - routine tsh monitoring - patient has been stable for many years Skin rash - uncertain etiology - trial of triamcinolone  x2 weeks - reassess at follow up, would consider punch biopsy if worsening at that time.    [name], DO [facility] "

## 2024-05-09 NOTE — Assessment & Plan Note (Signed)
-   routine tsh monitoring - patient has been stable for many years

## 2024-05-09 NOTE — Assessment & Plan Note (Signed)
-   recheck A1c today, 6.4 - currently diet controlled, will discuss intervention with mother at follow up  - recheck in 3 months

## 2024-05-10 LAB — TSH: TSH: 3.81 u[IU]/mL (ref 0.450–4.500)

## 2024-05-17 ENCOUNTER — Ambulatory Visit

## 2024-05-17 VITALS — Ht 64.0 in | Wt 220.0 lb

## 2024-05-17 DIAGNOSIS — Z0000 Encounter for general adult medical examination without abnormal findings: Secondary | ICD-10-CM

## 2024-05-17 NOTE — Patient Instructions (Signed)
 Ms. Gras,  Thank you for taking the time for your Medicare Wellness Visit. I appreciate your continued commitment to your health goals. Please review the care plan we discussed, and feel free to reach out if I can assist you further.  Please note that Annual Wellness Visits do not include a physical exam. Some assessments may be limited, especially if the visit was conducted virtually. If needed, we may recommend an in-person follow-up with your provider.  Ongoing Care Seeing your primary care provider every 3 to 6 months helps us  monitor your health and provide consistent, personalized care.   Referrals If a referral was made during today's visit and you haven't received any updates within two weeks, please contact the referred provider directly to check on the status.  Recommended Screenings:  Health Maintenance  Topic Date Due   Hepatitis C Screening  Never done   Hepatitis B Vaccine (1 of 3 - 19+ 3-dose series) Never done   DTaP/Tdap/Td vaccine (2 - Tdap) 05/16/2018   Pneumococcal Vaccine (3 of 3 - PCV20 or PCV21) 06/06/2020   Medicare Annual Wellness Visit  01/28/2024   Pap with HPV screening  11/03/2024*   COVID-19 Vaccine (7 - Pfizer risk 2025-26 season) 08/23/2024   Flu Shot  Completed   HPV Vaccine (No Doses Required) Completed   HIV Screening  Completed   Meningitis B Vaccine  Aged Out  *Topic was postponed. The date shown is not the original due date.       05/17/2024   11:54 AM  Advanced Directives  Does Patient Have a Medical Advance Directive? No  Would patient like information on creating a medical advance directive? No - Patient declined    Vision: Annual vision screenings are recommended for early detection of glaucoma, cataracts, and diabetic retinopathy. These exams can also reveal signs of chronic conditions such as diabetes and high blood pressure.  Dental: Annual dental screenings help detect early signs of oral cancer, gum disease, and other conditions  linked to overall health, including heart disease and diabetes.  Please see the attached documents for additional preventive care recommendations.

## 2024-05-17 NOTE — Progress Notes (Signed)
 "  Chief Complaint  Patient presents with   Medicare Wellness    SUBSEQUENT     Subjective:   Vanessa Alvarez is a 39 y.o. female who presents for a Medicare Annual Wellness Visit.  Visit info / Clinical Intake: Medicare Wellness Visit Type:: Subsequent Annual Wellness Visit Persons participating in visit and providing information:: patient Medicare Wellness Visit Mode:: Video Since this visit was completed virtually, some vitals may be partially provided or unavailable. Missing vitals are due to the limitations of the virtual format.: Documented vitals are patient reported If Telephone or Video please confirm:: I connected with patient using audio/video enable telemedicine. I verified patient identity with two identifiers, discussed telehealth limitations, and patient agreed to proceed. Patient Location:: HOME Provider Location:: OFFICE Interpreter Needed?: No Pre-visit prep was completed: yes AWV questionnaire completed by patient prior to visit?: no Living arrangements:: with family/others Patient's Overall Health Status Rating: good Typical amount of pain: none Does pain affect daily life?: no Are you currently prescribed opioids?: no  Dietary Habits and Nutritional Risks How many meals a day?: 3 (0 SNACKS, DRINKS WATER) Eats fruit and vegetables daily?: yes Most meals are obtained by: preparing own meals In the last 2 weeks, have you had any of the following?: none Diabetic:: no  Functional Status Activities of Daily Living (to include ambulation/medication): Independent Ambulation: Independent Medication Administration: Independent Home Management (perform basic housework or laundry): Independent Manage your own finances?: (!) no (MOTHER) Primary transportation is: family / friends Concerns about vision?: no *vision screening is required for [facility]* (DR. GINNIE PINAL) Concerns about hearing?: no  Fall Screening Falls in the past year?: 0 Number of falls in past year:  0 Was there an injury with Fall?: 0 Fall Risk Category Calculator: 0 Patient Fall Risk Level: Low Fall Risk  Fall Risk Patient at Risk for Falls Due to: No Fall Risks Fall risk Follow up: Falls evaluation completed; Education provided  Home and Transportation Safety: All rugs have non-skid backing?: N/A, no rugs All stairs or steps have railings?: N/A, no stairs Grab bars in the bathtub or shower?: yes Have non-skid surface in bathtub or shower?: yes Good home lighting?: yes Regular seat belt use?: yes Hospital stays in the last year:: no  Cognitive Assessment Difficulty concentrating, remembering, or making decisions? : yes Will 6CIT or Mini Cog be Completed: no 6CIT or Mini Cog Declined: patient has a diagnosis of dementia or cognitive impairment (Patient has a medical dx of Intellectual Disability)  Advance Directives (For Healthcare) Does Patient Have a Medical Advance Directive?: No Would patient like information on creating a medical advance directive?: No - Patient declined  Reviewed/Updated  Reviewed/Updated: Reviewed All (Medical, Surgical, Family, Medications, Allergies, Care Teams, Patient Goals)    Allergies (verified) Patient has no known allergies.   Current Medications (verified) Outpatient Encounter Medications as of 05/17/2024  Medication Sig   amLODipine (NORVASC) 10 MG tablet Take 5 mg by mouth daily.   aripiprazole  (ABILIFY ) 10 MG disintegrating tablet Take 10 mg by mouth at bedtime.   aspirin EC 81 MG tablet Take 81 mg by mouth daily. Swallow whole.   atorvastatin (LIPITOR) 10 MG tablet Take 10 mg by mouth daily.   benzonatate  (TESSALON ) 100 MG capsule Take 1 capsule (100 mg total) by mouth 3 (three) times daily as needed for cough. (Patient not taking: Reported on 01/28/2023)   busPIRone (BUSPAR) 10 MG tablet Take 10 mg by mouth 3 (three) times daily.   carvedilol  (COREG ) 12.5 MG  tablet TAKE 1 TABLET (12.5MG  TOTAL) BY MOUTH TWICE A DAY WITH MEALS    chlorthalidone (HYGROTON) 25 MG tablet Take 12.5 tablets by mouth daily.   cyclobenzaprine  (FLEXERIL ) 5 MG tablet Take 1 tablet (5 mg total) by mouth 3 (three) times daily as needed for muscle spasms.   ferrous sulfate  325 (65 FE) MG tablet Take 1 tablet (325 mg total) by mouth 2 (two) times daily. (Patient taking differently: Take 325 mg by mouth daily with breakfast.)   hydrOXYzine  (ATARAX /VISTARIL ) 25 MG tablet Take 25 mg by mouth 3 (three) times daily as needed for anxiety.   lamoTRIgine  (LAMICTAL ) 200 MG tablet Take 100-200 mg by mouth See admin instructions. Takes 200mg   tablet at night and 100 mg tablet in the AM   levothyroxine  (SYNTHROID ) 112 MCG tablet TAKE 1 TABLET BY MOUTH EVERY DAY BEFORE BREAKFAST   mycophenolate  (MYFORTIC) 180 MG EC tablet Take 450 mg by mouth 2 (two) times daily. 3 tabs in the morning and 3 tabs in the evening   omeprazole  (PRILOSEC) 20 MG capsule TAKE 1 CAPSULE BY MOUTH ONCE TO TWICE A DAY. PLEASE SCHEDULE AN OFFICE VISIT FOR FURTHER REFILLS.   ondansetron  (ZOFRAN -ODT) 4 MG disintegrating tablet Take 1 tablet (4 mg total) by mouth every 8 (eight) hours as needed for nausea or vomiting.   oxymetazoline  (AFRIN NASAL SPRAY) 0.05 % nasal spray Place 1 spray into both nostrils as needed (nose bleed. Do not use more than 3 days.).   polyethylene glycol powder (GLYCOLAX /MIRALAX ) 17 GM/SCOOP powder Take 17 g by mouth daily as needed for mild constipation. Reported on 05/26/2015   predniSONE  (DELTASONE ) 5 MG tablet Take 5 mg by mouth daily.   sennosides-docusate sodium (SENOKOT-S) 8.6-50 MG tablet Take 1 tablet by mouth daily.   sertraline  (ZOLOFT ) 100 MG tablet Take 100 mg by mouth daily.    sodium chloride  (OCEAN) 0.65 % SOLN nasal spray Place 1 spray into both nostrils daily.   sulfamethoxazole-trimethoprim (BACTRIM) 400-80 MG tablet Take 1 tablet by mouth 3 (three) times a week. M-W-F   tacrolimus  ER (ENVARSUS  XR) 1 MG TB24 Take 3 mg by mouth See admin instructions. Take  with the 4 mg for a total of 7 mg daily   tacrolimus  ER (ENVARSUS  XR) 4 MG TB24 Take 4 mg by mouth See admin instructions. Take with (3) 1 mg for a total of 7 mg daily   terbinafine  (LAMISIL ) 1 % cream Apply 1 Application topically 2 (two) times daily.   triamcinolone  ointment (KENALOG ) 0.5 % Apply 1 Application topically 2 (two) times daily.   Facility-Administered Encounter Medications as of 05/17/2024  Medication   diclofenac  Sodium (VOLTAREN ) 1 % topical gel 4 g    History: Past Medical History:  Diagnosis Date   Anemia    Iron Def   Anxiety    Autism spectrum    Behavioral problems    Depression    End stage renal disease (HCC)    ESRD on dialysis (HCC)    ESRD on HD dx 2016. Sees Dr Rayburn with [facility].  Tues, Thurs, Sat schedule.     GERD (gastroesophageal reflux disease)    Hyperlipidemia    Hypertension    HYPOTHYROIDISM 03/12/2009   Qualifier: Diagnosis of  By: Ta MD, Cat     MENTAL RETARDATION 06/23/2006   Qualifier: Diagnosis of  By: Sharron Railing     Mood disorder    Sees Dr Reino, who prescribes meds   Neck pain 05/04/2017  Rectal bleeding 03/04/2015   Shortness of breath dyspnea    with exertion   Past Surgical History:  Procedure Laterality Date   A/V FISTULAGRAM Left 04/09/2022   Procedure: A/V Fistulagram;  Surgeon: Eliza Lonni RAMAN, MD;  [facility] INVASIVE CV LAB;  Service: Cardiovascular;  Laterality: Left;   AV FISTULA PLACEMENT Left 03/17/2015   Procedure: CREATION OF LEFT BRACHIOCEPHALIC ARTERIOVENOUS (AV) FISTULA ;  Surgeon: Redell LITTIE Door, MD;  Location: [facility] OR;  Service: Vascular;  Laterality: Left;   CHOLECYSTECTOMY  11/24/2004   INSERTION OF DIALYSIS CATHETER Right 03/17/2015   Procedure: INSERTION OF DIALYSIS CATHETER RIGHT INTERNAL JUIGULAR PLACEMENT;  Surgeon: Redell LITTIE Door, MD;  Location: [facility] OR;  Service: Vascular;  Laterality: Right;   KIDNEY TRANSPLANT Right 11/2019   Family History  Problem Relation Age of  Onset   Hypertension Mother    Diabetes Maternal Grandmother    Hypertension Maternal Grandmother    Heart disease Maternal Grandmother    Bladder Cancer Maternal Grandmother    Diabetes Maternal Grandfather    Cancer Maternal Grandfather        prostate   Colon cancer Neg Hx    Pancreatic cancer Neg Hx    Esophageal cancer Neg Hx    Liver disease Neg Hx    Stomach cancer Neg Hx    Rectal cancer Neg Hx    Social History   Occupational History   Occupation: disabled  Tobacco Use   Smoking status: Never   Smokeless tobacco: Never  Vaping Use   Vaping status: Never Used  Substance and Sexual Activity   Alcohol use: No    Alcohol/week: 0.0 standard drinks of alcohol   Drug use: No   Sexual activity: Never   Tobacco Counseling Counseling given: Not Answered  SDOH Screenings   Food Insecurity: No Food Insecurity (05/17/2024)  Housing: Low Risk (05/17/2024)  Transportation Needs: No Transportation Needs (05/17/2024)  Utilities: Not At Risk (05/17/2024)  Alcohol Screen: Low Risk (01/28/2023)  Depression (PHQ2-9): Low Risk (05/17/2024)  Physical Activity: Insufficiently Active (05/17/2024)  Social Connections: Moderately Isolated (05/17/2024)  Stress: No Stress Concern Present (05/17/2024)  Tobacco Use: Low Risk (05/17/2024)  Health Literacy: Adequate Health Literacy (01/28/2023)   See flowsheets for full screening details  Depression Screen Depression Screening Exception Documentation Depression Screening Exception:: Medical reason  PHQ 2 & 9 Depression Scale- Over the past 2 weeks, how often have you been bothered by any of the following problems? Little interest or pleasure in doing things: 0 Feeling down, depressed, or hopeless (PHQ Adolescent also includes...irritable): 0 PHQ-2 Total Score: 0 Trouble falling or staying asleep, or sleeping too much: 0 Feeling tired or having little energy: 0 Poor appetite or overeating (PHQ Adolescent also includes...weight loss):  0 Feeling bad about yourself - or that you are a failure or have let yourself or your family down: 0 Trouble concentrating on things, such as reading the newspaper or watching television (PHQ Adolescent also includes...like school work): 0 Moving or speaking so slowly that other people could have noticed. Or the opposite - being so fidgety or restless that you have been moving around a lot more than usual: 0 Thoughts that you would be better off dead, or of hurting yourself in some way: 0 PHQ-9 Total Score: 0 If you checked off any problems, how difficult have these problems made it for you to do your work, take care of things at home, or get along with other people?: Not difficult at all  Goals Addressed             This Visit's Progress    05/17/2024: To exercise more and get my weight down.       I want to be under 200 pounds             Objective:    Today's Vitals   05/17/24 1152  Weight: 220 lb (99.8 kg)  Height: 5' 4 (1.626 m)  PainSc: 0-No pain   Body mass index is 37.76 kg/m.  Hearing/Vision screen No results found. Immunizations and Health Maintenance Health Maintenance  Topic Date Due   Hepatitis C Screening  Never done   Hepatitis B Vaccines 19-59 Average Risk (1 of 3 - 19+ 3-dose series) Never done   Pneumococcal Vaccine (3 of 3 - PCV20 or PCV21) 06/06/2020   Cervical Cancer Screening (HPV/Pap Cotest)  11/03/2024 (Originally 09/18/2019)   COVID-19 Vaccine (8 - Pfizer risk 2025-26 season) 08/23/2024   Medicare Annual Wellness (AWV)  05/17/2025   DTaP/Tdap/Td (3 - Td or Tdap) 02/23/2034   Influenza Vaccine  Completed   HPV VACCINES (No Doses Required) Completed   HIV Screening  Completed   Meningococcal B Vaccine  Aged Out        Assessment/Plan:  This is a routine wellness examination for [place].  Patient Care Team: Cleotilde Lukes, DO as PCP - General (Family Medicine) Cindie Ole DASEN, MD (Inactive) as PCP - Electrophysiology  (Cardiology) Rayburn Pac, MD (Inactive) as Consulting Physician (Nephrology) Cleotilde Sewer, OD as Consulting Physician (Optometry)  I have personally reviewed and noted the following in the patients chart:   Medical and social history Use of alcohol, tobacco or illicit drugs  Current medications and supplements including opioid prescriptions. Functional ability and status Nutritional status Physical activity Advanced directives List of other physicians Hospitalizations, surgeries, and ER visits in previous 12 months Vitals Screenings to include cognitive, depression, and falls Referrals and appointments  No orders of the defined types were placed in this encounter.  In addition, I have reviewed and discussed with patient certain preventive protocols, quality metrics, and best practice recommendations. A written personalized care plan for preventive services as well as general preventive health recommendations were provided to patient.   Roz LOISE Fuller, LPN   [id number]   Return in about 1 year (around 05/17/2025) for Medicare wellness.  After Visit Summary: (MyChart) Due to this being a telephonic visit, the after visit summary with patients personalized plan was offered to patient via MyChart   Nurse Notes:  No voiced or noted concerns at this time HM Addressed: Vaccines Due: Pneumoccocal and Hepatitis B Vaccine. Patient is overdue for Hepatitis C Screening. NCIR was verified and immunization record updated for Dtap, Flu and Covid-19.  "

## 2024-05-23 ENCOUNTER — Ambulatory Visit: Payer: Self-pay | Admitting: Family Medicine

## 2024-05-31 ENCOUNTER — Ambulatory Visit: Admitting: Family Medicine

## 2024-06-13 ENCOUNTER — Ambulatory Visit: Admitting: Family Medicine
